# Patient Record
Sex: Female | Born: 1965 | Race: White | Hispanic: No | Marital: Single | Smoking: Current every day smoker
Health system: Southern US, Community
[De-identification: ages and names within clinical notes are randomized; demographics above are authoritative.]

## PROBLEM LIST (undated history)

## (undated) DIAGNOSIS — E785 Hyperlipidemia, unspecified: Secondary | ICD-10-CM

## (undated) DIAGNOSIS — K602 Anal fissure, unspecified: Secondary | ICD-10-CM

## (undated) DIAGNOSIS — G4730 Sleep apnea, unspecified: Secondary | ICD-10-CM

## (undated) DIAGNOSIS — D126 Benign neoplasm of colon, unspecified: Secondary | ICD-10-CM

## (undated) DIAGNOSIS — C439 Malignant melanoma of skin, unspecified: Secondary | ICD-10-CM

## (undated) DIAGNOSIS — I10 Essential (primary) hypertension: Secondary | ICD-10-CM

## (undated) DIAGNOSIS — F329 Major depressive disorder, single episode, unspecified: Secondary | ICD-10-CM

## (undated) DIAGNOSIS — Z8543 Personal history of malignant neoplasm of ovary: Secondary | ICD-10-CM

## (undated) DIAGNOSIS — G5600 Carpal tunnel syndrome, unspecified upper limb: Secondary | ICD-10-CM

## (undated) DIAGNOSIS — C569 Malignant neoplasm of unspecified ovary: Secondary | ICD-10-CM

## (undated) DIAGNOSIS — J449 Chronic obstructive pulmonary disease, unspecified: Secondary | ICD-10-CM

## (undated) DIAGNOSIS — K219 Gastro-esophageal reflux disease without esophagitis: Secondary | ICD-10-CM

## (undated) DIAGNOSIS — J45909 Unspecified asthma, uncomplicated: Secondary | ICD-10-CM

## (undated) DIAGNOSIS — F32A Depression, unspecified: Secondary | ICD-10-CM

## (undated) DIAGNOSIS — M1990 Unspecified osteoarthritis, unspecified site: Secondary | ICD-10-CM

## (undated) DIAGNOSIS — F419 Anxiety disorder, unspecified: Secondary | ICD-10-CM

## (undated) HISTORY — PX: OTHER SURGICAL HISTORY: SHX169

## (undated) HISTORY — DX: Personal history of malignant neoplasm of ovary: Z85.43

## (undated) HISTORY — DX: Unspecified osteoarthritis, unspecified site: M19.90

## (undated) HISTORY — DX: Depression, unspecified: F32.A

## (undated) HISTORY — DX: Essential (primary) hypertension: I10

## (undated) HISTORY — DX: Anxiety disorder, unspecified: F41.9

## (undated) HISTORY — DX: Malignant neoplasm of unspecified ovary: C56.9

## (undated) HISTORY — DX: Anal fissure, unspecified: K60.2

## (undated) HISTORY — PX: HIP FRACTURE SURGERY: SHX118

## (undated) HISTORY — DX: Gastro-esophageal reflux disease without esophagitis: K21.9

## (undated) HISTORY — DX: Unspecified asthma, uncomplicated: J45.909

## (undated) HISTORY — DX: Malignant melanoma of skin, unspecified: C43.9

## (undated) HISTORY — DX: Sleep apnea, unspecified: G47.30

## (undated) HISTORY — DX: Carpal tunnel syndrome, unspecified upper limb: G56.00

## (undated) HISTORY — DX: Hyperlipidemia, unspecified: E78.5

## (undated) HISTORY — DX: Benign neoplasm of colon, unspecified: D12.6

## (undated) HISTORY — PX: HEMORRHOID SURGERY: SHX153

---
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
## 1984-06-30 DIAGNOSIS — C569 Malignant neoplasm of unspecified ovary: Secondary | ICD-10-CM

## 1984-06-30 HISTORY — PX: ABDOMINAL HYSTERECTOMY: SHX81

## 1984-06-30 HISTORY — DX: Malignant neoplasm of unspecified ovary: C56.9

## 1995-07-01 DIAGNOSIS — C439 Malignant melanoma of skin, unspecified: Secondary | ICD-10-CM

## 1995-07-01 HISTORY — DX: Malignant melanoma of skin, unspecified: C43.9

## 1995-07-01 HISTORY — PX: MELANOMA EXCISION: SHX5266

## 2003-10-30 ENCOUNTER — Emergency Department (HOSPITAL_COMMUNITY): Admission: EM | Admit: 2003-10-30 | Discharge: 2003-10-31 | Payer: Self-pay | Admitting: Emergency Medicine

## 2004-02-06 ENCOUNTER — Ambulatory Visit (HOSPITAL_COMMUNITY): Admission: RE | Admit: 2004-02-06 | Discharge: 2004-02-06 | Payer: Self-pay | Admitting: Oncology

## 2004-04-05 ENCOUNTER — Ambulatory Visit (HOSPITAL_BASED_OUTPATIENT_CLINIC_OR_DEPARTMENT_OTHER): Admission: RE | Admit: 2004-04-05 | Discharge: 2004-04-05 | Payer: Self-pay | Admitting: Internal Medicine

## 2004-05-27 ENCOUNTER — Ambulatory Visit: Payer: Self-pay | Admitting: Pulmonary Disease

## 2004-06-07 ENCOUNTER — Ambulatory Visit: Payer: Self-pay | Admitting: Internal Medicine

## 2004-07-02 ENCOUNTER — Ambulatory Visit: Payer: Self-pay | Admitting: Pulmonary Disease

## 2004-07-20 ENCOUNTER — Emergency Department (HOSPITAL_COMMUNITY): Admission: EM | Admit: 2004-07-20 | Discharge: 2004-07-20 | Payer: Self-pay | Admitting: Emergency Medicine

## 2004-07-23 ENCOUNTER — Ambulatory Visit: Payer: Self-pay | Admitting: Internal Medicine

## 2004-07-26 ENCOUNTER — Emergency Department (HOSPITAL_COMMUNITY): Admission: EM | Admit: 2004-07-26 | Discharge: 2004-07-26 | Payer: Self-pay | Admitting: Emergency Medicine

## 2004-08-03 ENCOUNTER — Ambulatory Visit (HOSPITAL_COMMUNITY): Admission: RE | Admit: 2004-08-03 | Discharge: 2004-08-03 | Payer: Self-pay | Admitting: Internal Medicine

## 2004-12-19 ENCOUNTER — Ambulatory Visit: Payer: Self-pay | Admitting: Endocrinology

## 2005-01-03 ENCOUNTER — Ambulatory Visit: Payer: Self-pay | Admitting: Endocrinology

## 2005-01-10 ENCOUNTER — Ambulatory Visit: Payer: Self-pay | Admitting: Endocrinology

## 2005-01-10 ENCOUNTER — Other Ambulatory Visit: Admission: RE | Admit: 2005-01-10 | Discharge: 2005-01-10 | Payer: Self-pay | Admitting: Endocrinology

## 2005-05-16 ENCOUNTER — Ambulatory Visit: Payer: Self-pay | Admitting: Endocrinology

## 2005-05-28 ENCOUNTER — Emergency Department (HOSPITAL_COMMUNITY): Admission: EM | Admit: 2005-05-28 | Discharge: 2005-05-28 | Payer: Self-pay | Admitting: Emergency Medicine

## 2005-06-30 HISTORY — PX: HIP FRACTURE SURGERY: SHX118

## 2005-07-06 ENCOUNTER — Emergency Department (HOSPITAL_COMMUNITY): Admission: EM | Admit: 2005-07-06 | Discharge: 2005-07-06 | Payer: Self-pay | Admitting: Emergency Medicine

## 2005-09-29 ENCOUNTER — Ambulatory Visit: Payer: Self-pay | Admitting: Endocrinology

## 2005-10-22 ENCOUNTER — Emergency Department (HOSPITAL_COMMUNITY): Admission: EM | Admit: 2005-10-22 | Discharge: 2005-10-22 | Payer: Self-pay | Admitting: Emergency Medicine

## 2005-11-03 ENCOUNTER — Ambulatory Visit: Payer: Self-pay | Admitting: Endocrinology

## 2005-11-10 ENCOUNTER — Encounter: Admission: RE | Admit: 2005-11-10 | Discharge: 2005-12-09 | Payer: Self-pay | Admitting: Endocrinology

## 2006-02-06 ENCOUNTER — Ambulatory Visit: Payer: Self-pay | Admitting: Family Medicine

## 2006-02-13 ENCOUNTER — Ambulatory Visit: Payer: Self-pay | Admitting: Family Medicine

## 2006-02-18 ENCOUNTER — Encounter: Admission: RE | Admit: 2006-02-18 | Discharge: 2006-02-18 | Payer: Self-pay | Admitting: Endocrinology

## 2006-04-24 ENCOUNTER — Ambulatory Visit: Payer: Self-pay | Admitting: Family Medicine

## 2006-08-07 ENCOUNTER — Emergency Department (HOSPITAL_COMMUNITY): Admission: EM | Admit: 2006-08-07 | Discharge: 2006-08-07 | Payer: Self-pay | Admitting: Emergency Medicine

## 2006-08-10 ENCOUNTER — Ambulatory Visit: Payer: Self-pay | Admitting: Family Medicine

## 2006-09-11 ENCOUNTER — Ambulatory Visit: Payer: Self-pay | Admitting: Family Medicine

## 2006-11-16 ENCOUNTER — Encounter: Payer: Self-pay | Admitting: Family Medicine

## 2006-11-16 ENCOUNTER — Encounter: Admission: RE | Admit: 2006-11-16 | Discharge: 2006-11-16 | Payer: Self-pay | Admitting: Family Medicine

## 2007-01-06 ENCOUNTER — Ambulatory Visit: Payer: Self-pay | Admitting: Family Medicine

## 2007-01-06 LAB — CONVERTED CEMR LAB
ALT: 26 units/L (ref 0–35)
AST: 22 units/L (ref 0–37)
Albumin: 3.7 g/dL (ref 3.5–5.2)
Alkaline Phosphatase: 65 units/L (ref 39–117)
BUN: 20 mg/dL (ref 6–23)
Basophils Absolute: 0 10*3/uL (ref 0.0–0.1)
Basophils Relative: 0.3 % (ref 0.0–1.0)
Bilirubin, Direct: 0.1 mg/dL (ref 0.0–0.3)
CO2: 23 meq/L (ref 19–32)
Calcium: 9.4 mg/dL (ref 8.4–10.5)
Chloride: 112 meq/L (ref 96–112)
Cholesterol: 175 mg/dL (ref 0–200)
Creatinine, Ser: 0.6 mg/dL (ref 0.4–1.2)
Direct LDL: 105.7 mg/dL
Eosinophils Absolute: 0.1 10*3/uL (ref 0.0–0.6)
Eosinophils Relative: 0.9 % (ref 0.0–5.0)
GFR calc Af Amer: 140 mL/min
GFR calc non Af Amer: 116 mL/min
Glucose, Bld: 107 mg/dL — ABNORMAL HIGH (ref 70–99)
HCT: 38.5 % (ref 36.0–46.0)
HDL: 46.6 mg/dL (ref >39.0)
Hemoglobin: 13.4 g/dL (ref 12.0–15.0)
Hgb A1c MFr Bld: 6.1 % — ABNORMAL HIGH (ref 4.6–6.0)
Lymphocytes Relative: 27.2 % (ref 12.0–46.0)
MCHC: 34.9 g/dL (ref 30.0–36.0)
MCV: 85.8 fL (ref 78.0–100.0)
Monocytes Absolute: 0.2 10*3/uL (ref 0.2–0.7)
Monocytes Relative: 3 % (ref 3.0–11.0)
Neutro Abs: 5.7 10*3/uL (ref 1.4–7.7)
Neutrophils Relative %: 68.6 % (ref 43.0–77.0)
Platelets: 381 10*3/uL (ref 150–400)
Potassium: 4 meq/L (ref 3.5–5.1)
RBC: 4.48 M/uL (ref 3.87–5.11)
RDW: 13.4 % (ref 11.5–14.6)
Sodium: 140 meq/L (ref 135–145)
TSH: 0.93 microintl units/mL (ref 0.35–5.50)
Total Bilirubin: 0.6 mg/dL (ref 0.3–1.2)
Total CHOL/HDL Ratio: 3.8
Total Protein: 7.5 g/dL (ref 6.0–8.3)
Triglycerides: 215 mg/dL (ref 0–149)
VLDL: 43 mg/dL — ABNORMAL HIGH (ref 0–40)
WBC: 8.3 10*3/uL (ref 4.5–10.5)

## 2007-01-22 DIAGNOSIS — I10 Essential (primary) hypertension: Secondary | ICD-10-CM | POA: Insufficient documentation

## 2007-01-22 DIAGNOSIS — E119 Type 2 diabetes mellitus without complications: Secondary | ICD-10-CM | POA: Insufficient documentation

## 2007-01-22 DIAGNOSIS — E785 Hyperlipidemia, unspecified: Secondary | ICD-10-CM | POA: Insufficient documentation

## 2007-03-22 ENCOUNTER — Encounter: Payer: Self-pay | Admitting: *Deleted

## 2007-05-05 ENCOUNTER — Ambulatory Visit: Payer: Self-pay | Admitting: Family Medicine

## 2007-05-05 DIAGNOSIS — M76899 Other specified enthesopathies of unspecified lower limb, excluding foot: Secondary | ICD-10-CM | POA: Insufficient documentation

## 2007-07-19 ENCOUNTER — Telehealth: Payer: Self-pay | Admitting: Family Medicine

## 2007-07-22 ENCOUNTER — Encounter: Payer: Self-pay | Admitting: Family Medicine

## 2007-07-23 ENCOUNTER — Encounter: Admission: RE | Admit: 2007-07-23 | Discharge: 2007-07-23 | Payer: Self-pay | Admitting: Family Medicine

## 2007-08-31 ENCOUNTER — Encounter: Admission: RE | Admit: 2007-08-31 | Discharge: 2007-10-04 | Payer: Self-pay | Admitting: Family Medicine

## 2007-12-29 ENCOUNTER — Ambulatory Visit: Payer: Self-pay | Admitting: Family Medicine

## 2008-01-05 ENCOUNTER — Ambulatory Visit: Payer: Self-pay | Admitting: Family Medicine

## 2008-01-05 LAB — CONVERTED CEMR LAB
Glucose, Urine, Semiquant: NEGATIVE
Nitrite: NEGATIVE
Specific Gravity, Urine: >=1.03
Urobilinogen, UA: 0.2
WBC Urine, dipstick: NEGATIVE
pH: 5

## 2008-01-06 LAB — CONVERTED CEMR LAB
ALT: 28 units/L (ref 0–35)
AST: 23 units/L (ref 0–37)
Albumin: 3.6 g/dL (ref 3.5–5.2)
Alkaline Phosphatase: 63 units/L (ref 39–117)
BUN: 15 mg/dL (ref 6–23)
Basophils Absolute: 0.1 10*3/uL (ref 0.0–0.1)
Basophils Relative: 0.9 % (ref 0.0–1.0)
Bilirubin, Direct: 0.1 mg/dL (ref 0.0–0.3)
CO2: 27 meq/L (ref 19–32)
Calcium: 9.3 mg/dL (ref 8.4–10.5)
Chloride: 107 meq/L (ref 96–112)
Cholesterol: 147 mg/dL (ref 0–200)
Creatinine, Ser: 0.7 mg/dL (ref 0.4–1.2)
Creatinine,U: 314.5 mg/dL
Direct LDL: 85 mg/dL
Eosinophils Absolute: 0.1 10*3/uL (ref 0.0–0.7)
Eosinophils Relative: 0.9 % (ref 0.0–5.0)
GFR calc Af Amer: 117 mL/min
GFR calc non Af Amer: 97 mL/min
Glucose, Bld: 118 mg/dL — ABNORMAL HIGH (ref 70–99)
HCT: 40.2 % (ref 36.0–46.0)
HDL: 39.7 mg/dL (ref >39.0)
Hemoglobin: 13.7 g/dL (ref 12.0–15.0)
Hgb A1c MFr Bld: 6.2 % — ABNORMAL HIGH (ref 4.6–6.0)
Lymphocytes Relative: 30.1 % (ref 12.0–46.0)
MCHC: 34 g/dL (ref 30.0–36.0)
MCV: 87.6 fL (ref 78.0–100.0)
Microalb Creat Ratio: 3.2 mg/g (ref 0.0–30.0)
Microalb, Ur: 1 mg/dL (ref 0.0–1.9)
Monocytes Absolute: 0.3 10*3/uL (ref 0.1–1.0)
Monocytes Relative: 4 % (ref 3.0–12.0)
Neutro Abs: 4.4 10*3/uL (ref 1.4–7.7)
Neutrophils Relative %: 64.1 % (ref 43.0–77.0)
Platelets: 357 10*3/uL (ref 150–400)
Potassium: 4 meq/L (ref 3.5–5.1)
RBC: 4.58 M/uL (ref 3.87–5.11)
RDW: 13.1 % (ref 11.5–14.6)
Sodium: 142 meq/L (ref 135–145)
TSH: 1.09 microintl units/mL (ref 0.35–5.50)
Total Bilirubin: 0.8 mg/dL (ref 0.3–1.2)
Total CHOL/HDL Ratio: 3.7
Total Protein: 7 g/dL (ref 6.0–8.3)
Triglycerides: 206 mg/dL (ref 0–149)
VLDL: 41 mg/dL — ABNORMAL HIGH (ref 0–40)
WBC: 7 10*3/uL (ref 4.5–10.5)

## 2008-08-16 ENCOUNTER — Ambulatory Visit: Payer: Self-pay | Admitting: Vascular Surgery

## 2008-08-16 ENCOUNTER — Ambulatory Visit: Payer: Self-pay | Admitting: Internal Medicine

## 2008-08-16 ENCOUNTER — Encounter: Payer: Self-pay | Admitting: Internal Medicine

## 2008-08-16 ENCOUNTER — Ambulatory Visit: Payer: Self-pay | Admitting: Cardiovascular Disease

## 2008-08-16 ENCOUNTER — Observation Stay (HOSPITAL_COMMUNITY): Admission: EM | Admit: 2008-08-16 | Discharge: 2008-08-18 | Payer: Self-pay | Admitting: Emergency Medicine

## 2008-08-16 ENCOUNTER — Encounter: Payer: Self-pay | Admitting: Family Medicine

## 2008-08-17 ENCOUNTER — Encounter: Payer: Self-pay | Admitting: Internal Medicine

## 2008-08-23 ENCOUNTER — Ambulatory Visit: Payer: Self-pay | Admitting: Family Medicine

## 2008-08-23 DIAGNOSIS — K219 Gastro-esophageal reflux disease without esophagitis: Secondary | ICD-10-CM | POA: Insufficient documentation

## 2008-08-23 DIAGNOSIS — R079 Chest pain, unspecified: Secondary | ICD-10-CM | POA: Insufficient documentation

## 2008-08-30 ENCOUNTER — Telehealth: Payer: Self-pay | Admitting: Family Medicine

## 2008-10-04 ENCOUNTER — Encounter: Payer: Self-pay | Admitting: Family Medicine

## 2008-10-04 ENCOUNTER — Other Ambulatory Visit: Admission: RE | Admit: 2008-10-04 | Discharge: 2008-10-04 | Payer: Self-pay | Admitting: Family Medicine

## 2008-10-04 ENCOUNTER — Ambulatory Visit: Payer: Self-pay | Admitting: Family Medicine

## 2008-10-04 DIAGNOSIS — M1990 Unspecified osteoarthritis, unspecified site: Secondary | ICD-10-CM | POA: Insufficient documentation

## 2008-10-06 ENCOUNTER — Ambulatory Visit: Payer: Self-pay | Admitting: Family Medicine

## 2008-10-06 LAB — CONVERTED CEMR LAB
Bilirubin Urine: NEGATIVE
Blood in Urine, dipstick: NEGATIVE
Glucose, Urine, Semiquant: NEGATIVE
Ketones, urine, test strip: NEGATIVE
Nitrite: NEGATIVE
Specific Gravity, Urine: 1.02
Urobilinogen, UA: 0.2
WBC Urine, dipstick: NEGATIVE
pH: 7

## 2008-10-09 ENCOUNTER — Encounter: Payer: Self-pay | Admitting: Family Medicine

## 2008-10-11 LAB — CONVERTED CEMR LAB
ALT: 25 units/L (ref 0–35)
AST: 24 units/L (ref 0–37)
Albumin: 3.6 g/dL (ref 3.5–5.2)
Alkaline Phosphatase: 67 units/L (ref 39–117)
BUN: 13 mg/dL (ref 6–23)
Basophils Absolute: 0.1 10*3/uL (ref 0.0–0.1)
Basophils Relative: 1 % (ref 0.0–3.0)
Bilirubin, Direct: 0 mg/dL (ref 0.0–0.3)
CO2: 27 meq/L (ref 19–32)
Calcium: 9.2 mg/dL (ref 8.4–10.5)
Chloride: 108 meq/L (ref 96–112)
Cholesterol: 158 mg/dL (ref 0–200)
Creatinine, Ser: 0.7 mg/dL (ref 0.4–1.2)
Creatinine,U: 135.6 mg/dL
Eosinophils Absolute: 0.1 10*3/uL (ref 0.0–0.7)
Eosinophils Relative: 0.9 % (ref 0.0–5.0)
GFR calc non Af Amer: 96.18 mL/min (ref >60)
Glucose, Bld: 103 mg/dL — ABNORMAL HIGH (ref 70–99)
HCT: 37.8 % (ref 36.0–46.0)
HDL: 42.6 mg/dL (ref >39.00)
Hemoglobin: 13 g/dL (ref 12.0–15.0)
Hgb A1c MFr Bld: 6.3 % (ref 4.6–6.5)
LDL Cholesterol: 80 mg/dL (ref 0–99)
Lymphocytes Relative: 29.9 % (ref 12.0–46.0)
Lymphs Abs: 2.1 10*3/uL (ref 0.7–4.0)
MCHC: 34.5 g/dL (ref 30.0–36.0)
MCV: 86.4 fL (ref 78.0–100.0)
Microalb Creat Ratio: 3.7 mg/g (ref 0.0–30.0)
Microalb, Ur: 0.5 mg/dL (ref 0.0–1.9)
Monocytes Absolute: 0.2 10*3/uL (ref 0.1–1.0)
Monocytes Relative: 3.5 % (ref 3.0–12.0)
Neutro Abs: 4.6 10*3/uL (ref 1.4–7.7)
Neutrophils Relative %: 64.7 % (ref 43.0–77.0)
Platelets: 284 10*3/uL (ref 150.0–400.0)
Potassium: 3.8 meq/L (ref 3.5–5.1)
RBC: 4.37 M/uL (ref 3.87–5.11)
RDW: 13.6 % (ref 11.5–14.6)
Sodium: 143 meq/L (ref 135–145)
TSH: 1.18 microintl units/mL (ref 0.35–5.50)
Total Bilirubin: 0.6 mg/dL (ref 0.3–1.2)
Total CHOL/HDL Ratio: 4
Total Protein: 7.3 g/dL (ref 6.0–8.3)
Triglycerides: 179 mg/dL — ABNORMAL HIGH (ref 0.0–149.0)
VLDL: 35.8 mg/dL (ref 0.0–40.0)
WBC: 7.1 10*3/uL (ref 4.5–10.5)

## 2008-10-19 ENCOUNTER — Encounter: Payer: Self-pay | Admitting: *Deleted

## 2008-12-22 ENCOUNTER — Telehealth: Payer: Self-pay | Admitting: Family Medicine

## 2008-12-27 ENCOUNTER — Ambulatory Visit: Payer: Self-pay | Admitting: Family Medicine

## 2008-12-27 DIAGNOSIS — J209 Acute bronchitis, unspecified: Secondary | ICD-10-CM | POA: Insufficient documentation

## 2008-12-29 ENCOUNTER — Ambulatory Visit: Payer: Self-pay | Admitting: Family Medicine

## 2009-01-02 ENCOUNTER — Telehealth (INDEPENDENT_AMBULATORY_CARE_PROVIDER_SITE_OTHER): Payer: Self-pay | Admitting: *Deleted

## 2009-01-05 ENCOUNTER — Telehealth: Payer: Self-pay | Admitting: Family Medicine

## 2009-01-08 ENCOUNTER — Ambulatory Visit: Payer: Self-pay | Admitting: Family Medicine

## 2009-01-08 DIAGNOSIS — K645 Perianal venous thrombosis: Secondary | ICD-10-CM | POA: Insufficient documentation

## 2009-01-12 ENCOUNTER — Telehealth: Payer: Self-pay | Admitting: Family Medicine

## 2009-01-12 ENCOUNTER — Emergency Department (HOSPITAL_BASED_OUTPATIENT_CLINIC_OR_DEPARTMENT_OTHER): Admission: EM | Admit: 2009-01-12 | Discharge: 2009-01-12 | Payer: Self-pay | Admitting: Emergency Medicine

## 2009-08-21 ENCOUNTER — Encounter: Payer: Self-pay | Admitting: Family Medicine

## 2009-10-18 ENCOUNTER — Ambulatory Visit: Payer: Self-pay | Admitting: Internal Medicine

## 2009-10-18 LAB — CONVERTED CEMR LAB: Blood Glucose, Fingerstick: 101

## 2009-11-15 ENCOUNTER — Ambulatory Visit: Payer: Self-pay | Admitting: Family Medicine

## 2009-11-16 LAB — CONVERTED CEMR LAB
ALT: 36 units/L — ABNORMAL HIGH (ref 0–35)
AST: 26 units/L (ref 0–37)
Albumin: 3.9 g/dL (ref 3.5–5.2)
Alkaline Phosphatase: 79 units/L (ref 39–117)
BUN: 19 mg/dL (ref 6–23)
Basophils Absolute: 0 10*3/uL (ref 0.0–0.1)
Basophils Relative: 0.2 % (ref 0.0–3.0)
Bilirubin, Direct: 0.1 mg/dL (ref 0.0–0.3)
CO2: 24 meq/L (ref 19–32)
Calcium: 9 mg/dL (ref 8.4–10.5)
Chloride: 107 meq/L (ref 96–112)
Cholesterol: 171 mg/dL (ref 0–200)
Creatinine, Ser: 0.6 mg/dL (ref 0.4–1.2)
Creatinine,U: 189.3 mg/dL
Direct LDL: 106.3 mg/dL
Eosinophils Absolute: 0 10*3/uL (ref 0.0–0.7)
Eosinophils Relative: 0.6 % (ref 0.0–5.0)
GFR calc non Af Amer: 108.08 mL/min (ref >60)
Glucose, Bld: 103 mg/dL — ABNORMAL HIGH (ref 70–99)
HCT: 39.8 % (ref 36.0–46.0)
HDL: 49 mg/dL (ref >39.00)
Hemoglobin: 13.6 g/dL (ref 12.0–15.0)
Hgb A1c MFr Bld: 6.1 % (ref 4.6–6.5)
Lymphocytes Relative: 27.2 % (ref 12.0–46.0)
Lymphs Abs: 2.2 10*3/uL (ref 0.7–4.0)
MCHC: 34.1 g/dL (ref 30.0–36.0)
MCV: 87.2 fL (ref 78.0–100.0)
Microalb Creat Ratio: 1.5 mg/g (ref 0.0–30.0)
Microalb, Ur: 2.9 mg/dL — ABNORMAL HIGH (ref 0.0–1.9)
Monocytes Absolute: 0.3 10*3/uL (ref 0.1–1.0)
Monocytes Relative: 4.1 % (ref 3.0–12.0)
Neutro Abs: 5.4 10*3/uL (ref 1.4–7.7)
Neutrophils Relative %: 67.9 % (ref 43.0–77.0)
Platelets: 332 10*3/uL (ref 150.0–400.0)
Potassium: 4.2 meq/L (ref 3.5–5.1)
RBC: 4.56 M/uL (ref 3.87–5.11)
RDW: 14 % (ref 11.5–14.6)
Sodium: 140 meq/L (ref 135–145)
TSH: 1 microintl units/mL (ref 0.35–5.50)
Total Bilirubin: 0.6 mg/dL (ref 0.3–1.2)
Total CHOL/HDL Ratio: 3
Total Protein: 7.2 g/dL (ref 6.0–8.3)
Triglycerides: 223 mg/dL — ABNORMAL HIGH (ref 0.0–149.0)
VLDL: 44.6 mg/dL — ABNORMAL HIGH (ref 0.0–40.0)
WBC: 8 10*3/uL (ref 4.5–10.5)

## 2009-12-10 ENCOUNTER — Telehealth: Payer: Self-pay | Admitting: Family Medicine

## 2009-12-21 ENCOUNTER — Other Ambulatory Visit: Admission: RE | Admit: 2009-12-21 | Discharge: 2009-12-21 | Payer: Self-pay | Admitting: Family Medicine

## 2009-12-21 ENCOUNTER — Ambulatory Visit: Payer: Self-pay | Admitting: Family Medicine

## 2009-12-21 LAB — HM PAP SMEAR

## 2009-12-25 LAB — CONVERTED CEMR LAB: Pap Smear: NEGATIVE

## 2009-12-27 ENCOUNTER — Encounter: Admission: RE | Admit: 2009-12-27 | Discharge: 2009-12-27 | Payer: Self-pay | Admitting: Family Medicine

## 2009-12-27 LAB — HM MAMMOGRAPHY: HM Mammogram: NEGATIVE

## 2010-01-08 ENCOUNTER — Ambulatory Visit: Payer: Self-pay | Admitting: Diagnostic Radiology

## 2010-01-08 ENCOUNTER — Emergency Department (HOSPITAL_BASED_OUTPATIENT_CLINIC_OR_DEPARTMENT_OTHER): Admission: EM | Admit: 2010-01-08 | Discharge: 2010-01-08 | Payer: Self-pay | Admitting: Emergency Medicine

## 2010-01-09 ENCOUNTER — Encounter: Payer: Self-pay | Admitting: Family Medicine

## 2010-01-14 ENCOUNTER — Encounter: Payer: Self-pay | Admitting: Family Medicine

## 2010-03-11 ENCOUNTER — Telehealth: Payer: Self-pay | Admitting: Family Medicine

## 2010-03-29 ENCOUNTER — Telehealth: Payer: Self-pay | Admitting: Family Medicine

## 2010-04-04 ENCOUNTER — Ambulatory Visit: Payer: Self-pay | Admitting: Family Medicine

## 2010-04-04 DIAGNOSIS — K644 Residual hemorrhoidal skin tags: Secondary | ICD-10-CM | POA: Insufficient documentation

## 2010-06-06 ENCOUNTER — Emergency Department (HOSPITAL_BASED_OUTPATIENT_CLINIC_OR_DEPARTMENT_OTHER)
Admission: EM | Admit: 2010-06-06 | Discharge: 2010-06-06 | Payer: Self-pay | Source: Home / Self Care | Admitting: Emergency Medicine

## 2010-06-11 ENCOUNTER — Ambulatory Visit: Payer: Self-pay | Admitting: Family Medicine

## 2010-06-11 DIAGNOSIS — K921 Melena: Secondary | ICD-10-CM | POA: Insufficient documentation

## 2010-06-11 DIAGNOSIS — R1031 Right lower quadrant pain: Secondary | ICD-10-CM | POA: Insufficient documentation

## 2010-06-12 ENCOUNTER — Encounter: Payer: Self-pay | Admitting: Gastroenterology

## 2010-06-12 LAB — CONVERTED CEMR LAB
ALT: 31 units/L (ref 0–35)
AST: 30 units/L (ref 0–37)
Albumin: 3.7 g/dL (ref 3.5–5.2)
Alkaline Phosphatase: 70 units/L (ref 39–117)
Amylase: 49 units/L (ref 27–131)
BUN: 20 mg/dL (ref 6–23)
Basophils Absolute: 0 10*3/uL (ref 0.0–0.1)
Basophils Relative: 0.2 % (ref 0.0–3.0)
Bilirubin, Direct: 0.1 mg/dL (ref 0.0–0.3)
CO2: 26 meq/L (ref 19–32)
Calcium: 9.2 mg/dL (ref 8.4–10.5)
Chloride: 103 meq/L (ref 96–112)
Creatinine, Ser: 0.9 mg/dL (ref 0.4–1.2)
Eosinophils Absolute: 0 10*3/uL (ref 0.0–0.7)
Eosinophils Relative: 0.5 % (ref 0.0–5.0)
GFR calc non Af Amer: 75.28 mL/min (ref >60.00)
Glucose, Bld: 141 mg/dL — ABNORMAL HIGH (ref 70–99)
HCT: 37.3 % (ref 36.0–46.0)
Hemoglobin: 12.8 g/dL (ref 12.0–15.0)
Lymphocytes Relative: 29.3 % (ref 12.0–46.0)
Lymphs Abs: 2.6 10*3/uL (ref 0.7–4.0)
MCHC: 34.2 g/dL (ref 30.0–36.0)
MCV: 88.5 fL (ref 78.0–100.0)
Monocytes Absolute: 0.3 10*3/uL (ref 0.1–1.0)
Monocytes Relative: 3.7 % (ref 3.0–12.0)
Neutro Abs: 6 10*3/uL (ref 1.4–7.7)
Neutrophils Relative %: 66.3 % (ref 43.0–77.0)
Platelets: 371 10*3/uL (ref 150.0–400.0)
Potassium: 3.7 meq/L (ref 3.5–5.1)
RBC: 4.22 M/uL (ref 3.87–5.11)
RDW: 14.2 % (ref 11.5–14.6)
Sodium: 140 meq/L (ref 135–145)
Total Bilirubin: 0.8 mg/dL (ref 0.3–1.2)
Total Protein: 7.2 g/dL (ref 6.0–8.3)
WBC: 9 10*3/uL (ref 4.5–10.5)

## 2010-07-19 ENCOUNTER — Ambulatory Visit: Admit: 2010-07-19 | Payer: Self-pay | Admitting: Gastroenterology

## 2010-07-21 ENCOUNTER — Encounter: Payer: Self-pay | Admitting: Orthopaedic Surgery

## 2010-07-23 ENCOUNTER — Ambulatory Visit
Admission: RE | Admit: 2010-07-23 | Discharge: 2010-07-23 | Payer: Self-pay | Source: Home / Self Care | Attending: Family Medicine | Admitting: Family Medicine

## 2010-07-26 ENCOUNTER — Telehealth: Payer: Self-pay | Admitting: Family Medicine

## 2010-07-30 NOTE — Assessment & Plan Note (Signed)
Summary: BURNING AROUND RECTUM? // RS/PT [facility] FROM BMP/CJR   Vital Signs:  Patient profile:   45 year old female O2 Sat:      98 % Temp:     98.1 degrees F Pulse rate:   84 / minute BP sitting:   90 / 60  (left arm) Cuff size:   large  Vitals Entered By: Pura Spice, RN (April 04, 2010 9:55 AM) CC: rectal burning  constipation not using anusol supp    History of Present Illness: Here for 2 weeks of rectal pain. She has seen no bleeding. She has tried Anusol HC suppositories, Aloe cream, and Diltiazem cream but nothing helps. BMs are regular but painful to pass.   Allergies (verified): No Known Drug Allergies  Past History:  Past Medical History: Reviewed history from 12/21/2009 and no changes required. Diabetes mellitus, type II Hyperlipidemia Hypertension Ovarian CA, hx of-1986 Osteoarthritis, has chronic right hip pain, sees Dr. August Saucer GERD normal stress Myoview and normal ECHO 08-18-08  Past Surgical History: Reviewed history from 12/21/2009 and no changes required. TAH/ BSO with cervix still intact 1986 Melanoma removed- upper chest-1997 per Dr. Elmon Else Hemorrhoidectomy per Dr. Jimmye Norman  Review of Systems  The patient denies anorexia, fever, weight loss, weight gain, vision loss, decreased hearing, hoarseness, chest pain, syncope, dyspnea on exertion, peripheral edema, prolonged cough, headaches, hemoptysis, abdominal pain, melena, hematochezia, severe indigestion/heartburn, hematuria, incontinence, genital sores, muscle weakness, suspicious skin lesions, transient blindness, difficulty walking, depression, unusual weight change, abnormal bleeding, enlarged lymph nodes, angioedema, breast masses, and testicular masses.    Physical Exam  General:  Well-developed,well-nourished,in no acute distress; alert,appropriate and cooperative throughout examination Abdomen:  Bowel sounds positive,abdomen soft and non-tender without masses, organomegaly or hernias  noted. Rectal:  numerous external hemorrhoids which are quite tender   Impression & Recommendations:  Problem # 1:  HEMORRHOIDS, EXTERNAL (ICD-455.3)  Complete Medication List: 1)  Metformin Hcl 500 Mg Tb24 (Metformin hcl) .Marland Kitchen.. 1 by mouth two times a day 2)  Metoprolol Tartrate 25 Mg Tabs (Metoprolol tartrate) .Marland Kitchen.. 1 by mouth two times a day 3)  Lisinopril-hydrochlorothiazide 20-25 Mg Tabs (Lisinopril-hydrochlorothiazide) .... Once daily 4)  Omeprazole 40 Mg Cpdr (Omeprazole) .Marland Kitchen.. 1 once daily 5)  Anusol-hc 25 Mg Supp (Hydrocortisone acetate) .... Insert 1 per rectum three times a day 6)  Lipitor 40 Mg Tabs (Atorvastatin calcium) .... Once daily  Patient Instructions: 1)  She needs to see Surgery, and she will call them for an appt.  Prescriptions: LIPITOR 40 MG TABS (ATORVASTATIN CALCIUM) once daily  #30 x 11   Entered and Authorized by:   Nelwyn Salisbury MD   Signed by:   Nelwyn Salisbury MD on 04/04/2010   Method used:   Electronically to        [facility]  [phone]* (retail)       [address] [place], [place]  [address]       Ph: [phone] or [phone]       Fax: [phone]   RxID:   [id number]

## 2010-07-30 NOTE — Letter (Signed)
Summary: [facility]   Imported By: Maryln Gottron 08/27/2009 12:27:55  _____________________________________________________________________  External Attachment:    Type:   Image     Comment:   External Document

## 2010-07-30 NOTE — Assessment & Plan Note (Signed)
Summary: elevated BP/dm   Vital Signs:  Patient profile:   45 year old female Weight:      242 pounds Temp:     98.5 degrees F oral BP sitting:   140 / 90  (left arm) Cuff size:   large  Vitals Entered By: Duard Brady LPN (October 18, 2009 4:26 PM) CC: c/o elevated BP's - bingeing??? eating chips, icecream  drinking moutntian dew espeically in the evening -  Is Patient Diabetic? Yes Did you bring your meter with you today? No CBG Result 101   CC:  c/o elevated BP's - bingeing??? eating chips and icecream  drinking moutntian dew espeically in the evening - .  History of Present Illness: 45 year old patient has a history of type 2 diabetes and hypertension; she has been controlled on beta blocker therapy.  she has recently obtained a wrist blood pressure cuff, and has had some elevated readings.  She admits to a high salt diet and dietary noncompliance.  She feels well.  Unfortunately,  she did not bring her new blood pressure monitor with her for this office visit.  Allergies (verified): No Known Drug Allergies  Past History:  Past Medical History: Reviewed history from 10/04/2008 and no changes required. Diabetes mellitus, type II Hyperlipidemia Hypertension Ovarian CA, hx of-1986 Osteoarthritis, has chronic right hip pain, sees Dr. August Saucer  Physical Exam  General:  overweight-appearing.  140/90 Head:  Normocephalic and atraumatic without obvious abnormalities. No apparent alopecia or balding. Mouth:  Oral mucosa and oropharynx without lesions or exudates.  Teeth in good repair. Neck:  No deformities, masses, or tenderness noted. Lungs:  Normal respiratory effort, chest expands symmetrically. Lungs are clear to auscultation, no crackles or wheezes. Heart:  Normal rate and regular rhythm. S1 and S2 normal without gallop, murmur, click, rub or other extra sounds.   Impression & Recommendations:  Problem # 1:  HYPERTENSION (ICD-401.9)  Her updated medication list for  this problem includes:    Metoprolol Tartrate 25 Mg Tabs (Metoprolol tartrate) .Marland Kitchen... 1 by mouth two times a day    Triamterene-hctz 37.5-25 Mg Caps (Triamterene-hctz) ..... One every morning lifestyle modification, including salt restriction, exercise, weight loss.  All encouraged.  She is scheduled for a follow-up visit in a month or so and will place on diuretic therapy.  At this time.  She has been asked to bring her home blood pressure monitor to her next routine office visit  Problem # 2:  DIABETES MELLITUS, TYPE II (ICD-250.00)  Her updated medication list for this problem includes:    Metformin Hcl 500 Mg Tb24 (Metformin hcl) .Marland Kitchen... 1 by mouth two times a day  Orders: Capillary Blood Glucose/CBG (13086)  Complete Medication List: 1)  Metformin Hcl 500 Mg Tb24 (Metformin hcl) .Marland Kitchen.. 1 by mouth two times a day 2)  Metoprolol Tartrate 25 Mg Tabs (Metoprolol tartrate) .Marland Kitchen.. 1 by mouth two times a day 3)  Simvastatin 80 Mg Tabs (Simvastatin) .... Take 1 tablet by mouth at bedtime 4)  Triamterene-hctz 37.5-25 Mg Caps (Triamterene-hctz) .... One every morning  Patient Instructions: 1)  Limit your Sodium (Salt) to less than 2 grams a day(slightly less than 1/2 a teaspoon) to prevent fluid retention, swelling, or worsening of symptoms. 2)  It is important that you exercise regularly at least 20 minutes 5 times a week. If you develop chest pain, have severe difficulty breathing, or feel very tired , stop exercising immediately and seek medical attention. 3)  You need to  lose weight. Consider a lower calorie diet and regular exercise.  4)  Check your blood sugars regularly. If your readings are usually above : or below 70 you should contact our office. 5)  It is important that your Diabetic A1c level is checked every 3 months. 6)  Check your Blood Pressure regularly. If it is above: 150/90 you should make an appointment. Prescriptions: TRIAMTERENE-HCTZ 37.5-25 MG CAPS (TRIAMTERENE-HCTZ) one every  morning  #90 x 0   Entered and Authorized by:   Gordy Savers  MD   Signed by:   Gordy Savers  MD on 10/18/2009   Method used:   Electronically to        [facility]  [phone]* (retail)       [address] [place], [place]  [address]       Ph: [phone] or [phone]       Fax: [phone]   RxID:   [id number]

## 2010-07-30 NOTE — Assessment & Plan Note (Signed)
Summary: fu on bpv/pt will come in fasting/njrn   Vital Signs:  Patient profile:   45 year old female Weight:      240 pounds BMI:     40.08 Temp:     98.2 degrees F oral BP sitting:   140 / 108  (left arm) Cuff size:   regular  Vitals Entered By: Raechel Ache, RN (Nov 15, 2009 8:48 AM) CC: Follow up BP and fasting for labs.   History of Present Illness: Here to follow up on DM and HTN. She was here a few weeks ago with high BPs, and she was given a rx for triamterene HCTZ. However she did not get this filled because she wanted to see me first. Her BP at home still ranges from 140 to 170 systolic. She feels well. She does not check her blood sugars at home at all. She is fasting this am.   Allergies: No Known Drug Allergies  Past History:  Past Medical History: Reviewed history from 10/04/2008 and no changes required. Diabetes mellitus, type II Hyperlipidemia Hypertension Ovarian CA, hx of-1986 Osteoarthritis, has chronic right hip pain, sees Dr. August Saucer  Review of Systems  The patient denies anorexia, fever, weight loss, weight gain, vision loss, decreased hearing, hoarseness, chest pain, syncope, dyspnea on exertion, peripheral edema, prolonged cough, headaches, hemoptysis, abdominal pain, melena, hematochezia, severe indigestion/heartburn, hematuria, incontinence, genital sores, muscle weakness, suspicious skin lesions, transient blindness, difficulty walking, depression, unusual weight change, abnormal bleeding, enlarged lymph nodes, angioedema, breast masses, and testicular masses.    Physical Exam  General:  overweight-appearing.   Neck:  No deformities, masses, or tenderness noted. Lungs:  Normal respiratory effort, chest expands symmetrically. Lungs are clear to auscultation, no crackles or wheezes. Heart:  Normal rate and regular rhythm. S1 and S2 normal without gallop, murmur, click, rub or other extra sounds.   Impression & Recommendations:  Problem # 1:   HYPERTENSION (ICD-401.9)  The following medications were removed from the medication list:    Triamterene-hctz 37.5-25 Mg Caps (Triamterene-hctz) ..... One every morning Her updated medication list for this problem includes:    Metoprolol Tartrate 25 Mg Tabs (Metoprolol tartrate) .Marland Kitchen... 1 by mouth two times a day    Lisinopril-hydrochlorothiazide 20-25 Mg Tabs (Lisinopril-hydrochlorothiazide) ..... Once daily  Problem # 2:  HYPERLIPIDEMIA (ICD-272.4)  Her updated medication list for this problem includes:    Simvastatin 80 Mg Tabs (Simvastatin) .Marland Kitchen... Take 1 tablet by mouth at bedtime  Problem # 3:  DIABETES MELLITUS, TYPE II (ICD-250.00)  Her updated medication list for this problem includes:    Metformin Hcl 500 Mg Tb24 (Metformin hcl) .Marland Kitchen... 1 by mouth two times a day    Lisinopril-hydrochlorothiazide 20-25 Mg Tabs (Lisinopril-hydrochlorothiazide) ..... Once daily  Orders: UA Dipstick w/o Micro (automated)  (81003) Venipuncture (04540) TLB-Lipid Panel (80061-LIPID) TLB-BMP (Basic Metabolic Panel-BMET) (80048-METABOL) TLB-CBC Platelet - w/Differential (85025-CBCD) TLB-Hepatic/Liver Function Pnl (80076-HEPATIC) TLB-TSH (Thyroid Stimulating Hormone) (84443-TSH) TLB-Microalbumin/Creat Ratio, Urine (82043-MALB) TLB-A1C / Hgb A1C (Glycohemoglobin) (83036-A1C)  Complete Medication List: 1)  Metformin Hcl 500 Mg Tb24 (Metformin hcl) .Marland Kitchen.. 1 by mouth two times a day 2)  Metoprolol Tartrate 25 Mg Tabs (Metoprolol tartrate) .Marland Kitchen.. 1 by mouth two times a day 3)  Simvastatin 80 Mg Tabs (Simvastatin) .... Take 1 tablet by mouth at bedtime 4)  Lisinopril-hydrochlorothiazide 20-25 Mg Tabs (Lisinopril-hydrochlorothiazide) .... Once daily  Patient Instructions: 1)  Get labs today. Switch from triamterene to an ACE-diuretic med due to her diabetes.  Prescriptions: LISINOPRIL-HYDROCHLOROTHIAZIDE 20-25  MG TABS (LISINOPRIL-HYDROCHLOROTHIAZIDE) once daily  #30 x 11   Entered and Authorized by:    Nelwyn Salisbury MD   Signed by:   Nelwyn Salisbury MD on 11/15/2009   Method used:   Electronically to        [facility]  [phone]* (retail)       [address] [place], [place]  [address]       Ph: [phone] or [phone]       Fax: [phone]   RxID:   [id number] SIMVASTATIN 80 MG TABS (SIMVASTATIN) Take 1 tablet by mouth at bedtime  #30 x 11   Entered and Authorized by:   Nelwyn Salisbury MD   Signed by:   Nelwyn Salisbury MD on 11/15/2009   Method used:   Electronically to        [facility]  [phone]* (retail)       [address] [place], [place]  [address]       Ph: [phone] or [phone]       Fax: [phone]   RxID:   [id number] METFORMIN HCL 500 MG TB24 (METFORMIN HCL) 1 by mouth two times a day  #60 x 11   Entered and Authorized by:   Nelwyn Salisbury MD   Signed by:   Nelwyn Salisbury MD on 11/15/2009   Method used:   Electronically to        [facility]  [phone]* (retail)       [address] [place], [place]  [address]       Ph: [phone] or [phone]       Fax: [phone]   RxID:   [id number]   Appended Document: Orders Update     Clinical Lists Changes  Observations: Added new observation of COMMENTS: Wynona Canes, CMA  Nov 15, 2009 11:03 AM  (11/15/2009 11:02) Added new observation of PH URINE: 6.0  (11/15/2009 11:02) Added new observation of SPEC GR URIN: >=1.030  (11/15/2009 11:02) Added new observation of APPEARANCE U: Clear  (11/15/2009 11:02) Added new observation of WBC DIPSTK U: negative  (11/15/2009 11:02) Added new observation of NITRITE URN: negative  (11/15/2009 11:02) Added new observation of UROBILINOGEN: 0.2  (11/15/2009 11:02) Added new observation of PROTEIN, URN: 1+  (11/15/2009 11:02) Added new observation of BLOOD UR DIP: negative  (11/15/2009 11:02) Added new observation of KETONES URN: negative  (11/15/2009 11:02) Added new observation of BILIRUBIN UR: negative  (11/15/2009  11:02) Added new observation of GLUCOSE, URN: negative  (11/15/2009 11:02)      Laboratory Results   Urine Tests  Date/Time Recieved: Nov 15, 2009 11:03 AM  Date/Time Reported: Nov 15, 2009 11:03 AM   Routine Urinalysis   Appearance: Clear Glucose: negative   (Normal Range: Negative) Bilirubin: negative   (Normal Range: Negative) Ketone: negative   (Normal Range: Negative) Spec. Gravity: >=1.030   (Normal Range: 1.003-1.035) Blood: negative   (Normal Range: Negative) pH: 6.0   (Normal Range: 5.0-8.0) Protein: 1+   (Normal Range: Negative) Urobilinogen: 0.2   (Normal Range: 0-1) Nitrite: negative   (Normal Range: Negative) Leukocyte Esterace: negative   (Normal Range: Negative)    Comments: Wynona Canes, CMA  Nov 15, 2009 11:03 AM

## 2010-07-30 NOTE — Assessment & Plan Note (Signed)
Summary: cpx/pap/pt will come in fasting/ok per doc/njr   Vital Signs:  Patient profile:   45 year old female Weight:      240 pounds BMI:     40.08 BP sitting:   100 / 82  (left arm) Cuff size:   large  Vitals Entered By: Raechel Ache, RN (December 21, 2009 9:45 AM) CC: CPX, labs done 1 mo ago.    History of Present Illness: 45 yr old female for a cpx. She feels fine and has no concerns.   Allergies (verified): No Known Drug Allergies  Past History:  Past Medical History: Diabetes mellitus, type II Hyperlipidemia Hypertension Ovarian CA, hx of-1986 Osteoarthritis, has chronic right hip pain, sees Dr. August Saucer GERD normal stress Myoview and normal ECHO 08-18-08  Past Surgical History: TAH/ BSO with cervix still intact 1986 Melanoma removed- upper chest-1997 per Dr. Elmon Else Hemorrhoidectomy per Dr. Jimmye Norman  Family History: Reviewed history from 01/22/2007 and no changes required. Family History of Arthritis Family History Diabetes 1st degree relative Family History Other cancer-Colon, Prostate,Breast Family History of Cardiovascular disorder  Social History: Reviewed history from 01/22/2007 and no changes required. Single Former Smoker Alcohol use-no Drug use-no Regular exercise-no  Review of Systems  The patient denies anorexia, fever, weight loss, vision loss, decreased hearing, hoarseness, chest pain, syncope, dyspnea on exertion, peripheral edema, prolonged cough, headaches, hemoptysis, abdominal pain, melena, hematochezia, severe indigestion/heartburn, hematuria, incontinence, genital sores, muscle weakness, suspicious skin lesions, transient blindness, difficulty walking, depression, unusual weight change, abnormal bleeding, enlarged lymph nodes, angioedema, breast masses, and testicular masses.    Physical Exam  General:  overweight-appearing.   Head:  Normocephalic and atraumatic without obvious abnormalities. No apparent alopecia or balding. Eyes:   No corneal or conjunctival inflammation noted. EOMI. Perrla. Funduscopic exam benign, without hemorrhages, exudates or papilledema. Vision grossly normal. Ears:  External ear exam shows no significant lesions or deformities.  Otoscopic examination reveals clear canals, tympanic membranes are intact bilaterally without bulging, retraction, inflammation or discharge. Hearing is grossly normal bilaterally. Nose:  External nasal examination shows no deformity or inflammation. Nasal mucosa are pink and moist without lesions or exudates. Mouth:  Oral mucosa and oropharynx without lesions or exudates.  Teeth in good repair. Neck:  No deformities, masses, or tenderness noted. Chest Wall:  No deformities, masses, or tenderness noted. Breasts:  No mass, nodules, thickening, tenderness, bulging, retraction, inflamation, nipple discharge or skin changes noted.   Lungs:  Normal respiratory effort, chest expands symmetrically. Lungs are clear to auscultation, no crackles or wheezes. Heart:  Normal rate and regular rhythm. S1 and S2 normal without gallop, murmur, click, rub or other extra sounds. Abdomen:  Bowel sounds positive,abdomen soft and non-tender without masses, organomegaly or hernias noted. Genitalia:  normal introitus, no external lesions, no vaginal discharge, mucosa pink and moist, and no vaginal or cervical lesions.  Uterus and adnexae are absent. Pap smear was obtained.  Msk:  No deformity or scoliosis noted of thoracic or lumbar spine.   Pulses:  R and L carotid,radial,femoral,dorsalis pedis and posterior tibial pulses are full and equal bilaterally Extremities:  No clubbing, cyanosis, edema, or deformity noted with normal full range of motion of all joints.   Neurologic:  No cranial nerve deficits noted. Station and gait are normal. Plantar reflexes are down-going bilaterally. DTRs are symmetrical throughout. Sensory, motor and coordinative functions appear intact. Skin:  Intact without suspicious  lesions or rashes Cervical Nodes:  No lymphadenopathy noted Axillary Nodes:  No palpable  lymphadenopathy Inguinal Nodes:  No significant adenopathy Psych:  Cognition and judgment appear intact. Alert and cooperative with normal attention span and concentration. No apparent delusions, illusions, hallucinations   Impression & Recommendations:  Problem # 1:  WELL ADULT EXAM (ICD-V70.0)  Complete Medication List: 1)  Metformin Hcl 500 Mg Tb24 (Metformin hcl) .Marland Kitchen.. 1 by mouth two times a day 2)  Metoprolol Tartrate 25 Mg Tabs (Metoprolol tartrate) .Marland Kitchen.. 1 by mouth two times a day 3)  Simvastatin 80 Mg Tabs (Simvastatin) .... Take 1 tablet by mouth at bedtime 4)  Lisinopril-hydrochlorothiazide 20-25 Mg Tabs (Lisinopril-hydrochlorothiazide) .... Once daily  Other Orders: Ophthalmology Referral (Ophthalmology)  Patient Instructions: 1)  It is important that you exercise reguarly at least 20 minutes 5 times a week. If you develop chest pain, have severe difficulty breathing, or feel very tired, stop exercising immediately and seek medical attention.  2)  You need to lose weight. Consider a lower calorie diet and regular exercise.  3)  Schedule your mammogram.  4)  We will set her up for an eye exam. Prescriptions: SIMVASTATIN 80 MG TABS (SIMVASTATIN) Take 1 tablet by mouth at bedtime  #30 x 11   Entered and Authorized by:   Nelwyn Salisbury MD   Signed by:   Nelwyn Salisbury MD on 12/21/2009   Method used:   Electronically to        [facility]  [phone]* (retail)       [address] [place], [place]  [address]       Ph: [phone] or [phone]       Fax: [phone]   RxID:   [id number] METOPROLOL TARTRATE 25 MG TABS (METOPROLOL TARTRATE) 1 by mouth two times a day  #60 x 11   Entered and Authorized by:   Nelwyn Salisbury MD   Signed by:   Nelwyn Salisbury MD on 12/21/2009   Method used:   Electronically to        [facility]  [phone]* (retail)       [address]  [place], [place]  [address]       Ph: [phone] or [phone]       Fax: [phone]   RxID:   [id number]

## 2010-07-30 NOTE — Progress Notes (Signed)
Summary: ? change med  simvastatin  Phone Note From Pharmacy   [facility]er[facility] Summary of Call: ?change simvastin 80 mg  Initial call taken by: Pura Spice, RN,  March 11, 2010 11:52 AM  Follow-up for Phone Call        I agree. Stop the Zocor, and switch to Lipitor 40 mg once daily . Call in a one year supply Follow-up by: Nelwyn Salisbury MD,  March 11, 2010 2:58 PM  Additional Follow-up for Phone Call Additional follow up Details #1::        done pt aware Additional Follow-up by: Pura Spice, RN,  March 11, 2010 2:59 PM    New/Updated Medications: LIPITOR 40 MG TABS (ATORVASTATIN CALCIUM) 1 by mouth once daily Prescriptions: LIPITOR 40 MG TABS (ATORVASTATIN CALCIUM) 1 by mouth once daily  #30 x 11   Entered by:   Pura Spice, RN   Authorized by:   Nelwyn Salisbury MD   Signed by:   Pura Spice, RN on 03/11/2010   Method used:   Electronically to        [facility]  [phone]* (retail)       [address] [place], [place]  [address]       Ph: [phone] or [phone]       Fax: [phone]   RxID:   [id number]

## 2010-07-30 NOTE — Progress Notes (Signed)
Summary: rectal pain  Phone Note Call from Patient Call back at Home Phone [phone]   Caller: Patient Call For: Nelwyn Salisbury MD Summary of Call: Pt is complaining of rectal pain with BMs, and is asking if she should see him or [facility]??? Initial call taken by: Lynann Beaver CMA,  March 29, 2010 3:36 PM  Follow-up for Phone Call        we cannot see her today at this hour, and I doubt Surgery could either. I suggest she try some Anusol HC 25 mg suppositories to help reduce inflammation and pain. Call in #30 with 2 rf for her.  Follow-up by: Nelwyn Salisbury MD,  March 29, 2010 4:08 PM  Additional Follow-up for Phone Call Additional follow up Details #1::        pt aware. rx [facility]  Additional Follow-up by: Pura Spice, RN,  March 29, 2010 4:11 PM    New/Updated Medications: ANUSOL-HC 25 MG SUPP (HYDROCORTISONE ACETATE) insert 1 per rectum Prescriptions: ANUSOL-HC 25 MG SUPP (HYDROCORTISONE ACETATE) insert 1 per rectum  #30 x 2   Entered by:   Pura Spice, RN   Authorized by:   Nelwyn Salisbury MD   Signed by:   Pura Spice, RN on 03/29/2010   Method used:   Electronically to        [facility]  [phone]* (retail)       [address] [place], [place]  [address]       Ph: [phone] or [phone]       Fax: [phone]   RxID:   [id number]   Appended Document: [facility]     Appended Document: rectal pain per rectum three times a day  sent electronically and called to [facility].gh rn...............Marland Kitchen

## 2010-07-30 NOTE — Progress Notes (Signed)
Summary: needs cpx before 01-06-10  Phone Note Call from Patient Call back at Home Phone [phone]   Caller: Patient Call For: Nelwyn Salisbury MD Summary of Call: pt needs cpx before 01-06-10 Initial call taken by: Heron Sabins,  December 10, 2009 3:43 PM  Follow-up for Phone Call        work her in next week please Follow-up by: Nelwyn Salisbury MD,  December 12, 2009 1:07 PM  Additional Follow-up for Phone Call Additional follow up Details #1::        cpx 12-21-2009 945am fasting Additional Follow-up by: Heron Sabins,  December 12, 2009 2:23 PM

## 2010-07-30 NOTE — Letter (Signed)
Summary: [facility]   Imported By: Maryln Gottron 01/18/2010 14:11:07  _____________________________________________________________________  External Attachment:    Type:   Image     Comment:   External Document

## 2010-07-30 NOTE — Letter (Signed)
Summary: [facility]   Imported By: Maryln Gottron 02/01/2010 13:13:43  _____________________________________________________________________  External Attachment:    Type:   Image     Comment:   External Document

## 2010-08-01 NOTE — Miscellaneous (Signed)
Summary: Orders Update   Clinical Lists Changes  Orders: Added new Referral order of Gastroenterology Referral (GI) - Signed 

## 2010-08-01 NOTE — Assessment & Plan Note (Signed)
Summary: cough /bronchitis   Vital Signs:  Patient profile:   45 year old female Weight:      237 pounds O2 Sat:      98 % Temp:     98.5 degrees F Pulse rate:   113 / minute BP sitting:   112 / 80 Cuff size:   large  Vitals Entered By: Pura Spice, RN (July 23, 2010 4:19 PM) CC: [facility] was given Zpack and told had bronchitis  taking mucinex OTC    History of Present Illness: Here for a cough that she has had for about 2 weeks. Her chest is congested and she is coughing up green sputum. No fever. She went to Urgent Care last weekend and was given a Zpack. Now she is no better.   Allergies (verified): No Known Drug Allergies  Past History:  Past Medical History: Reviewed history from 12/21/2009 and no changes required. Diabetes mellitus, type II Hyperlipidemia Hypertension Ovarian CA, hx of-1986 Osteoarthritis, has chronic right hip pain, sees Dr. August Saucer GERD normal stress Myoview and normal ECHO 08-18-08  Review of Systems  The patient denies anorexia, fever, weight loss, weight gain, vision loss, decreased hearing, hoarseness, chest pain, syncope, dyspnea on exertion, peripheral edema, headaches, hemoptysis, abdominal pain, melena, hematochezia, severe indigestion/heartburn, hematuria, incontinence, genital sores, muscle weakness, suspicious skin lesions, transient blindness, difficulty walking, depression, unusual weight change, abnormal bleeding, enlarged lymph nodes, angioedema, breast masses, and testicular masses.    Physical Exam  General:  Well-developed,well-nourished,in no acute distress; alert,appropriate and cooperative throughout examination Head:  Normocephalic and atraumatic without obvious abnormalities. No apparent alopecia or balding. Eyes:  No corneal or conjunctival inflammation noted. EOMI. Perrla. Funduscopic exam benign, without hemorrhages, exudates or papilledema. Vision grossly normal. Ears:  External ear exam shows no  significant lesions or deformities.  Otoscopic examination reveals clear canals, tympanic membranes are intact bilaterally without bulging, retraction, inflammation or discharge. Hearing is grossly normal bilaterally. Nose:  External nasal examination shows no deformity or inflammation. Nasal mucosa are pink and moist without lesions or exudates. Mouth:  Oral mucosa and oropharynx without lesions or exudates.  Teeth in good repair. Neck:  No deformities, masses, or tenderness noted. Lungs:  scattered rhonchi    Impression & Recommendations:  Problem # 1:  ACUTE BRONCHITIS (ICD-466.0)  Her updated medication list for this problem includes:    Augmentin 875-125 Mg Tabs (Amoxicillin-pot clavulanate) .Marland Kitchen..Marland Kitchen Two times a day    Hydromet 5-1.5 Mg/65ml Syrp (Hydrocodone-homatropine) .Marland Kitchen... 1 tsp q 4 hours as needed cough  Orders: Depo- Medrol 40mg  (J1030) Depo- Medrol 80mg  (J1040) Admin of Therapeutic Inj  intramuscular or subcutaneous (16109)  Complete Medication List: 1)  Metformin Hcl 500 Mg Tb24 (Metformin hcl) .Marland Kitchen.. 1 by mouth two times a day 2)  Metoprolol Tartrate 25 Mg Tabs (Metoprolol tartrate) .Marland Kitchen.. 1 by mouth two times a day 3)  Lisinopril-hydrochlorothiazide 20-25 Mg Tabs (Lisinopril-hydrochlorothiazide) .... Once daily 4)  Omeprazole 40 Mg Cpdr (Omeprazole) .Marland Kitchen.. 1 once daily 5)  Anusol-hc 25 Mg Supp (Hydrocortisone acetate) .... Insert 1 per rectum three times a day 6)  Lipitor 40 Mg Tabs (Atorvastatin calcium) .... Once daily 7)  Augmentin 875-125 Mg Tabs (Amoxicillin-pot clavulanate) .... Two times a day 8)  Hydromet 5-1.5 Mg/83ml Syrp (Hydrocodone-homatropine) .Marland Kitchen.. 1 tsp q 4 hours as needed cough  Patient Instructions: 1)  Please schedule a follow-up appointment as needed .  Prescriptions: HYDROMET 5-1.5 MG/5ML SYRP (HYDROCODONE-HOMATROPINE) 1 tsp q 4 hours  as needed cough  #240 x 0   Entered and Authorized by:   Nelwyn Salisbury MD   Signed by:   Nelwyn Salisbury MD on 07/23/2010    Method used:   Print then Give to Patient   RxID:   [id number] AUGMENTIN 875-125 MG TABS (AMOXICILLIN-POT CLAVULANATE) two times a day  #20 x 0   Entered and Authorized by:   Nelwyn Salisbury MD   Signed by:   Nelwyn Salisbury MD on 07/23/2010   Method used:   Print then Give to Patient   RxID:   [id number]    Medication Administration  Injection # 1:    Medication: Depo- Medrol 40mg     Diagnosis: ACUTE BRONCHITIS (ICD-466.0)    Route: IM    Site: RUOQ gluteus    Exp Date: 12/2012    Lot #: [id number]    Mfr: Pharmacia    Patient tolerated injection without complications    Given by: Pura Spice, RN (July 23, 2010 5:14 PM)  Injection # 2:    Medication: Depo- Medrol 80mg     Diagnosis: ACUTE BRONCHITIS (ICD-466.0)    Route: IM    Site: RUOQ gluteus    Exp Date: 12/2012    Lot #: [id number]    Mfr: Pharmacia    Patient tolerated injection without complications    Given by: Pura Spice, RN (July 23, 2010 5:15 PM)  Orders Added: 1)  Depo- Medrol 40mg  [J1030] 2)  Depo- Medrol 80mg  [J1040] 3)  Admin of Therapeutic Inj  intramuscular or subcutaneous [96372] 4)  Est. Patient Level IV [84696]

## 2010-08-01 NOTE — Progress Notes (Signed)
Summary: med probs.  Phone Note Call from Patient Call back at Home Phone [phone]   Caller: Patient Call For: Nelwyn Salisbury MD Summary of Call: Cannot take Codeine Cough Syrup.  Keeps her awake and feels like she is getting worse.  Leave a message [phone]  Wants stronger antibiotics and a different cough med. Initial call taken by: Lynann Beaver CMA AAMA,  July 26, 2010 10:21 AM  Follow-up for Phone Call        the Augmentin I gave her is about as strong as it gets, so she needs to give it more time to work. There is no other cough med for me to call in, if she cannot tolerate hydrocodone. Stick with Delsym  Follow-up by: Nelwyn Salisbury MD,  July 26, 2010 2:16 PM  Additional Follow-up for Phone Call Additional follow up Details #1::        mess left on her  cell phone advised to call  back if more questions Additional Follow-up by: Pura Spice, RN,  July 26, 2010 2:51 PM

## 2010-08-01 NOTE — Assessment & Plan Note (Signed)
Summary: ABD PAIN/NJR PT DECLINE TO SEE ANOTHER DOC/NJR   Vital Signs:  Patient profile:   45 year old female Weight:      237 pounds Temp:     98.0 degrees F oral BP sitting:   92 / 68  (left arm) Cuff size:   large  Vitals Entered By: Sid Falcon LPN (June 11, 2010 12:54 PM)  History of Present Illness: Here to discuss 2 months of frequent lower abdominal cramping and diarrhea, often after every meal. Now in the past few weeks she has seen a fair amount of bright red blood mixed with the diarrhea stools also. No heartburn or fever or nausea. She has stopped using any sodas or any spicy foods. Her main food lately has been oatmeal. She saw the Urgent Care in [facility] last week, and they did a rectal exam. She does have some hemorrhoids, but it was obvious to them that her bleeding is coming from higher up along the GI tract.   Allergies (verified): No Known Drug Allergies  Past History:  Past Medical History: Reviewed history from 12/21/2009 and no changes required. Diabetes mellitus, type II Hyperlipidemia Hypertension Ovarian CA, hx of-1986 Osteoarthritis, has chronic right hip pain, sees Dr. August Saucer GERD normal stress Myoview and normal ECHO 08-18-08  Past Surgical History: Reviewed history from 12/21/2009 and no changes required. TAH/ BSO with cervix still intact 1986 Melanoma removed- upper chest-1997 per Dr. Elmon Else Hemorrhoidectomy per Dr. Jimmye Norman  Family History: Reviewed history from 01/22/2007 and no changes required. Family History of Arthritis Family History Diabetes 1st degree relative Family History Other cancer-Colon, Prostate,Breast Family History of Cardiovascular disorder  Review of Systems  The patient denies anorexia, fever, weight loss, weight gain, vision loss, decreased hearing, hoarseness, chest pain, syncope, dyspnea on exertion, peripheral edema, prolonged cough, headaches, hemoptysis, melena, severe indigestion/heartburn,  hematuria, incontinence, genital sores, muscle weakness, suspicious skin lesions, transient blindness, difficulty walking, depression, unusual weight change, abnormal bleeding, enlarged lymph nodes, angioedema, breast masses, and testicular masses.    Physical Exam  General:  Well-developed,well-nourished,in no acute distress; alert,appropriate and cooperative throughout examination Lungs:  Normal respiratory effort, chest expands symmetrically. Lungs are clear to auscultation, no crackles or wheezes. Heart:  Normal rate and regular rhythm. S1 and S2 normal without gallop, murmur, click, rub or other extra sounds. Abdomen:  soft, normal bowel sounds, no distention, no masses, no guarding, no rigidity, no rebound tenderness, no abdominal hernia, no inguinal hernia, no hepatomegaly, and no splenomegaly.  Moderately tender in the RLQ.    Impression & Recommendations:  Problem # 1:  ABDOMINAL PAIN RIGHT LOWER QUADRANT (ICD-789.03)  Orders: Venipuncture (21308) TLB-BMP (Basic Metabolic Panel-BMET) (80048-METABOL) TLB-CBC Platelet - w/Differential (85025-CBCD) TLB-Hepatic/Liver Function Pnl (80076-HEPATIC) TLB-Amylase (82150-AMYL) Specimen Handling (65784)  Problem # 2:  BLOOD IN STOOL (ICD-578.1)  Complete Medication List: 1)  Metformin Hcl 500 Mg Tb24 (Metformin hcl) .Marland Kitchen.. 1 by mouth two times a day 2)  Metoprolol Tartrate 25 Mg Tabs (Metoprolol tartrate) .Marland Kitchen.. 1 by mouth two times a day 3)  Lisinopril-hydrochlorothiazide 20-25 Mg Tabs (Lisinopril-hydrochlorothiazide) .... Once daily 4)  Omeprazole 40 Mg Cpdr (Omeprazole) .Marland Kitchen.. 1 once daily 5)  Anusol-hc 25 Mg Supp (Hydrocortisone acetate) .... Insert 1 per rectum three times a day 6)  Lipitor 40 Mg Tabs (Atorvastatin calcium) .... Once daily  Patient Instructions: 1)  this is consistent with colitis or inflammatory bowel disease. Get labs today. Refer to GI soon.    Orders Added: 1)  Est.  Patient Level IV [78295] 2)  Venipuncture  [36415] 3)  TLB-BMP (Basic Metabolic Panel-BMET) [80048-METABOL] 4)  TLB-CBC Platelet - w/Differential [85025-CBCD] 5)  TLB-Hepatic/Liver Function Pnl [80076-HEPATIC] 6)  TLB-Amylase [82150-AMYL] 7)  Specimen Handling [99000]

## 2010-08-01 NOTE — Letter (Signed)
Summary: New Patient letter  [facility]  [address] [place], [place] [address]   Phone: [phone]  Fax: [phone]       06/12/2010 MRN: [id number]  Morgan [name] [address] [place], [place]  [address]  Dear Morgan [name],  Welcome to the Gastroenterology Division at [facility].    You are scheduled to see Dr.  Jarold Motto on 07-20-2011 at 8:30am on the 3rd floor at [facility], [address]  We ask that you try to arrive at our office 15 minutes prior to your appointment time to allow for check-in.  We would like you to complete the enclosed self-administered evaluation form prior to your visit and bring it with you on the day of your appointment.  We will review it with you.  Also, please bring a complete list of all your medications or, if you prefer, bring the medication bottles and we will list them.  Please bring your insurance card so that we may make a copy of it.  If your insurance requires a referral to see a specialist, please bring your referral form from your primary care physician.  Co-payments are due at the time of your visit and may be paid by cash, check or credit card.     Your office visit will consist of a consult with your physician (includes a physical exam), any laboratory testing he/she may order, scheduling of any necessary diagnostic testing (e.g. x-ray, ultrasound, CT-scan), and scheduling of a procedure (e.g. Endoscopy, Colonoscopy) if required.  Please allow enough time on your schedule to allow for any/all of these possibilities.    If you cannot keep your appointment, please call [phone] to cancel or reschedule prior to your appointment date.  This allows [place] the opportunity to schedule an appointment for another patient in need of care.  If you do not cancel or reschedule by 5 p.m. the business day prior to your appointment date, you will be charged a $50.00 late cancellation/no-show fee.    Thank  you for choosing [facility] for your medical needs.  We appreciate the opportunity to care for you.  Please visit [place] at our website  to learn more about our practice.                     Sincerely,                                                             The Gastroenterology Division

## 2010-08-24 ENCOUNTER — Emergency Department (HOSPITAL_COMMUNITY)
Admission: EM | Admit: 2010-08-24 | Discharge: 2010-08-24 | Disposition: A | Discharge status: Home / Self Care | Payer: 59 | Attending: Emergency Medicine | Admitting: Emergency Medicine

## 2010-08-24 DIAGNOSIS — H538 Other visual disturbances: Secondary | ICD-10-CM | POA: Insufficient documentation

## 2010-08-24 DIAGNOSIS — H547 Unspecified visual loss: Secondary | ICD-10-CM | POA: Insufficient documentation

## 2010-08-24 DIAGNOSIS — Z79899 Other long term (current) drug therapy: Secondary | ICD-10-CM | POA: Insufficient documentation

## 2010-08-24 DIAGNOSIS — E119 Type 2 diabetes mellitus without complications: Secondary | ICD-10-CM | POA: Insufficient documentation

## 2010-08-24 DIAGNOSIS — I10 Essential (primary) hypertension: Secondary | ICD-10-CM | POA: Insufficient documentation

## 2010-08-24 DIAGNOSIS — H539 Unspecified visual disturbance: Secondary | ICD-10-CM | POA: Insufficient documentation

## 2010-08-24 DIAGNOSIS — Z8543 Personal history of malignant neoplasm of ovary: Secondary | ICD-10-CM | POA: Insufficient documentation

## 2010-08-26 ENCOUNTER — Telehealth: Payer: Self-pay | Admitting: Family Medicine

## 2010-08-26 NOTE — Telephone Encounter (Signed)
Pt still has cough can not take cough med with codeine, Pt would like a refill on amoxicillin call into [facility] [phone]

## 2010-08-27 NOTE — Telephone Encounter (Signed)
No, I saw her over a month ago. Needs an OV

## 2010-09-02 NOTE — Telephone Encounter (Signed)
Pt no longer needs ov

## 2010-09-05 ENCOUNTER — Other Ambulatory Visit: Payer: Self-pay | Admitting: *Deleted

## 2010-09-05 NOTE — Telephone Encounter (Signed)
patient  Would like to know if there is something cheaper than lipitor she can try.  [facility]

## 2010-09-06 NOTE — Telephone Encounter (Signed)
No, it is already generic. Should be quite affordable

## 2010-09-10 ENCOUNTER — Telehealth: Payer: Self-pay | Admitting: *Deleted

## 2010-09-10 LAB — CBC
HCT: 37.6 % (ref 36.0–46.0)
Hemoglobin: 12.8 g/dL (ref 12.0–15.0)
MCH: 29.2 pg (ref 26.0–34.0)
MCHC: 34 g/dL (ref 30.0–36.0)
MCV: 85.6 fL (ref 78.0–100.0)
Platelets: 372 10*3/uL (ref 150–400)
RBC: 4.39 MIL/uL (ref 3.87–5.11)
RDW: 13.9 % (ref 11.5–15.5)
WBC: 11 10*3/uL — ABNORMAL HIGH (ref 4.0–10.5)

## 2010-09-10 LAB — DIFFERENTIAL
Basophils Absolute: 0 10*3/uL (ref 0.0–0.1)
Basophils Relative: 0 % (ref 0–1)
Eosinophils Absolute: 0.1 10*3/uL (ref 0.0–0.7)
Eosinophils Relative: 1 % (ref 0–5)
Lymphocytes Relative: 30 % (ref 12–46)
Lymphs Abs: 3.3 10*3/uL (ref 0.7–4.0)
Monocytes Absolute: 0.6 10*3/uL (ref 0.1–1.0)
Monocytes Relative: 6 % (ref 3–12)
Neutro Abs: 7 10*3/uL (ref 1.7–7.7)
Neutrophils Relative %: 63 % (ref 43–77)

## 2010-09-10 LAB — HEMOCCULT GUIAC POC 1CARD (OFFICE): Fecal Occult Bld: POSITIVE

## 2010-09-10 LAB — URINALYSIS, ROUTINE W REFLEX MICROSCOPIC
Bilirubin Urine: NEGATIVE
Glucose, UA: NEGATIVE mg/dL
Ketones, ur: NEGATIVE mg/dL
Leukocytes, UA: NEGATIVE
Nitrite: NEGATIVE
Protein, ur: NEGATIVE mg/dL
Specific Gravity, Urine: 1.027 (ref 1.005–1.030)
Urobilinogen, UA: 0.2 mg/dL (ref 0.0–1.0)
pH: 5.5 (ref 5.0–8.0)

## 2010-09-10 LAB — COMPREHENSIVE METABOLIC PANEL
ALT: 37 U/L — ABNORMAL HIGH (ref 0–35)
AST: 25 U/L (ref 0–37)
Albumin: 4.5 g/dL (ref 3.5–5.2)
Alkaline Phosphatase: 97 U/L (ref 39–117)
BUN: 26 mg/dL — ABNORMAL HIGH (ref 6–23)
CO2: 27 mEq/L (ref 19–32)
Calcium: 10 mg/dL (ref 8.4–10.5)
Chloride: 102 mEq/L (ref 96–112)
Creatinine, Ser: 1 mg/dL (ref 0.4–1.2)
GFR calc Af Amer: >60 mL/min (ref >60)
GFR calc non Af Amer: 60 mL/min — ABNORMAL LOW (ref >60)
Glucose, Bld: 111 mg/dL — ABNORMAL HIGH (ref 70–99)
Potassium: 3.4 mEq/L — ABNORMAL LOW (ref 3.5–5.1)
Sodium: 143 mEq/L (ref 135–145)
Total Bilirubin: 0.8 mg/dL (ref 0.3–1.2)
Total Protein: 8.1 g/dL (ref 6.0–8.3)

## 2010-09-10 LAB — URINE MICROSCOPIC-ADD ON

## 2010-09-10 NOTE — Telephone Encounter (Signed)
Notified pt. 

## 2010-09-10 NOTE — Telephone Encounter (Signed)
Message left on voice mail.

## 2010-09-10 NOTE — Telephone Encounter (Signed)
Error

## 2010-09-15 LAB — URINALYSIS, ROUTINE W REFLEX MICROSCOPIC
Bilirubin Urine: NEGATIVE
Glucose, UA: NEGATIVE mg/dL
Hgb urine dipstick: NEGATIVE
Ketones, ur: NEGATIVE mg/dL
Nitrite: NEGATIVE
Protein, ur: NEGATIVE mg/dL
Specific Gravity, Urine: 1.029 (ref 1.005–1.030)
Urobilinogen, UA: 0.2 mg/dL (ref 0.0–1.0)
pH: 5.5 (ref 5.0–8.0)

## 2010-09-25 ENCOUNTER — Encounter: Payer: Self-pay | Admitting: Family Medicine

## 2010-09-26 ENCOUNTER — Telehealth: Payer: Self-pay | Admitting: *Deleted

## 2010-09-26 NOTE — Telephone Encounter (Signed)
[facility] Triage Call Report Triage Record Num: [id number] Operator: Alphonsa Overall Patient Name: Morgan [name] Call Date & Time: 09/25/2010 10:51:02PM Patient Phone: [phone] PCP: Tera Mater. Clent Ridges Patient Gender: Female PCP Fax : [phone] Patient DOB: 1966/06/04 Practice Name: [facility] Reason for Call: [name] calling about sleep concerns and depression. Onset 09/25/10. Sees psychiatrist monthy. BS 145 at 2200 normal in am 120s-150s. No change in meds. Not able to sleep well at night. Having trouble going to sleep. New stressors in pt's life recently. Pt with new agitation and frustration,hx of sleep apnea. [name] aware needs to call MD office 09/26/10 for sleep concern. Protocol(s) Used: Sleep Disorders Recommended Outcome per Protocol: See Provider within 24 hours Reason for Outcome: Sleep deprivation AND irritable, agitated or multiple episodes of apnea per night Care Advice: ~ Another adult should drive. ~ Should not be alone, arrange for support (family member, friend, etc.). ~ Call provider if symptoms worsen or new symptoms develop. Avoid the use of stimulants including caffeine (coffee, some soft drinks, some energy drinks, tea and chocolate), cocaine, and amphetamines. Also avoid drinking alcohol. ~ ~ SYMPTOM / CONDITION MANAGEMENT ~ List, or take, all current prescription(s), nonprescription or alternative medication(s) to provider for evaluation. 09/25/2010 11:10:01PM Page 1 of 1 [email or web address]

## 2010-10-06 LAB — URINALYSIS, ROUTINE W REFLEX MICROSCOPIC
Bilirubin Urine: NEGATIVE
Glucose, UA: NEGATIVE mg/dL
Hgb urine dipstick: NEGATIVE
Ketones, ur: 15 mg/dL — AB
Nitrite: NEGATIVE
Protein, ur: NEGATIVE mg/dL
Specific Gravity, Urine: 1.038 — ABNORMAL HIGH (ref 1.005–1.030)
Urobilinogen, UA: 0.2 mg/dL (ref 0.0–1.0)
pH: 5 (ref 5.0–8.0)

## 2010-10-06 LAB — CBC
HCT: 40.6 % (ref 36.0–46.0)
Hemoglobin: 13.8 g/dL (ref 12.0–15.0)
MCHC: 33.9 g/dL (ref 30.0–36.0)
MCV: 86.5 fL (ref 78.0–100.0)
Platelets: 342 10*3/uL (ref 150–400)
RBC: 4.69 MIL/uL (ref 3.87–5.11)
RDW: 13.5 % (ref 11.5–15.5)
WBC: 9.2 10*3/uL (ref 4.0–10.5)

## 2010-10-06 LAB — HEMOCCULT GUIAC POC 1CARD (OFFICE): Fecal Occult Bld: POSITIVE

## 2010-10-15 LAB — BASIC METABOLIC PANEL
BUN: 15 mg/dL (ref 6–23)
CO2: 24 mEq/L (ref 19–32)
Calcium: 9.2 mg/dL (ref 8.4–10.5)
Chloride: 105 mEq/L (ref 96–112)
Creatinine, Ser: 0.84 mg/dL (ref 0.4–1.2)
GFR calc Af Amer: >60 mL/min (ref >60)
GFR calc non Af Amer: >60 mL/min (ref >60)
Glucose, Bld: 124 mg/dL — ABNORMAL HIGH (ref 70–99)
Potassium: 4 mEq/L (ref 3.5–5.1)
Sodium: 140 mEq/L (ref 135–145)

## 2010-10-15 LAB — CBC
HCT: 37.3 % (ref 36.0–46.0)
HCT: 40.1 % (ref 36.0–46.0)
Hemoglobin: 13.3 g/dL (ref 12.0–15.0)
Hemoglobin: 13.8 g/dL (ref 12.0–15.0)
MCHC: 34.5 g/dL (ref 30.0–36.0)
MCHC: 35.5 g/dL (ref 30.0–36.0)
MCV: 85.7 fL (ref 78.0–100.0)
MCV: 87.1 fL (ref 78.0–100.0)
Platelets: 157 10*3/uL (ref 150–400)
Platelets: 336 10*3/uL (ref 150–400)
RBC: 4.35 MIL/uL (ref 3.87–5.11)
RBC: 4.6 MIL/uL (ref 3.87–5.11)
RDW: 14 % (ref 11.5–15.5)
RDW: 14.2 % (ref 11.5–15.5)
WBC: 7 10*3/uL (ref 4.0–10.5)
WBC: 9.7 10*3/uL (ref 4.0–10.5)

## 2010-10-15 LAB — COMPREHENSIVE METABOLIC PANEL
ALT: 29 U/L (ref 0–35)
AST: 27 U/L (ref 0–37)
Albumin: 3.7 g/dL (ref 3.5–5.2)
Alkaline Phosphatase: 77 U/L (ref 39–117)
BUN: 18 mg/dL (ref 6–23)
CO2: 22 mEq/L (ref 19–32)
Calcium: 9.5 mg/dL (ref 8.4–10.5)
Chloride: 105 mEq/L (ref 96–112)
Creatinine, Ser: 0.99 mg/dL (ref 0.4–1.2)
GFR calc Af Amer: >60 mL/min (ref >60)
GFR calc non Af Amer: >60 mL/min (ref >60)
Glucose, Bld: 132 mg/dL — ABNORMAL HIGH (ref 70–99)
Potassium: 4.1 mEq/L (ref 3.5–5.1)
Sodium: 138 mEq/L (ref 135–145)
Total Bilirubin: 0.7 mg/dL (ref 0.3–1.2)
Total Protein: 7.3 g/dL (ref 6.0–8.3)

## 2010-10-15 LAB — GLUCOSE, CAPILLARY
Glucose-Capillary: 112 mg/dL — ABNORMAL HIGH (ref 70–99)
Glucose-Capillary: 116 mg/dL — ABNORMAL HIGH (ref 70–99)
Glucose-Capillary: 117 mg/dL — ABNORMAL HIGH (ref 70–99)
Glucose-Capillary: 130 mg/dL — ABNORMAL HIGH (ref 70–99)
Glucose-Capillary: 135 mg/dL — ABNORMAL HIGH (ref 70–99)
Glucose-Capillary: 145 mg/dL — ABNORMAL HIGH (ref 70–99)
Glucose-Capillary: 179 mg/dL — ABNORMAL HIGH (ref 70–99)

## 2010-10-15 LAB — HEMOGLOBIN A1C
Hgb A1c MFr Bld: 6.4 % — ABNORMAL HIGH (ref 4.6–6.1)
Mean Plasma Glucose: 137 mg/dL

## 2010-10-15 LAB — CARDIAC PANEL(CRET KIN+CKTOT+MB+TROPI)
CK, MB: 0.9 ng/mL (ref 0.3–4.0)
CK, MB: 0.9 ng/mL (ref 0.3–4.0)
Relative Index: INVALID (ref 0.0–2.5)
Relative Index: INVALID (ref 0.0–2.5)
Total CK: 50 U/L (ref 7–177)
Total CK: 56 U/L (ref 7–177)
Troponin I: <0.01 ng/mL (ref 0.00–0.06)
Troponin I: <0.01 ng/mL (ref 0.00–0.06)

## 2010-10-15 LAB — POCT CARDIAC MARKERS
CKMB, poc: <1 ng/mL — ABNORMAL LOW (ref 1.0–8.0)
Myoglobin, poc: 82.6 ng/mL (ref 12–200)
Troponin i, poc: <0.05 ng/mL (ref 0.00–0.09)

## 2010-10-15 LAB — D-DIMER, QUANTITATIVE: D-Dimer, Quant: 0.25 ug/mL-FEU (ref 0.00–0.48)

## 2010-10-15 LAB — CK TOTAL AND CKMB (NOT AT ARMC)
CK, MB: 1.1 ng/mL (ref 0.3–4.0)
Relative Index: INVALID (ref 0.0–2.5)
Total CK: 33 U/L (ref 7–177)

## 2010-10-15 LAB — PROTIME-INR
INR: 0.9 (ref 0.00–1.49)
Prothrombin Time: 11.9 seconds (ref 11.6–15.2)

## 2010-10-15 LAB — APTT: aPTT: 28 seconds (ref 24–37)

## 2010-10-15 LAB — TROPONIN I: Troponin I: <0.01 ng/mL (ref 0.00–0.06)

## 2010-11-08 ENCOUNTER — Other Ambulatory Visit: Payer: Self-pay

## 2010-11-08 MED ORDER — LISINOPRIL-HYDROCHLOROTHIAZIDE 20-25 MG PO TABS: 1.0000 | ORAL_TABLET | Freq: Every day | ORAL | Status: DC

## 2010-11-08 NOTE — Telephone Encounter (Signed)
rx called in to [facility] lisinopril-hctz 20-25 x1

## 2010-11-12 NOTE — Discharge Summary (Signed)
Morgan [name], Morgan [name]           ACCOUNT NO.:  [id number]   MEDICAL RECORD NO.:  [id number]          PATIENT TYPE:  OBV   LOCATION:  4705                         FACILITY:  [facility]   PHYSICIAN:  Valerie A. Felicity Coyer, MDDATE OF BIRTH:  1966/01/13   DATE OF ADMISSION:  08/16/2008  DATE OF DISCHARGE:  08/18/2008                               DISCHARGE SUMMARY   DISCHARGE DIAGNOSES:  1. Chest pain atypical for multiple risk factors for coronary artery      disease, negative inpatient evaluation.  2. History of gastroesophageal reflux disease with presumed esophageal      spasm contributing to pain as above.  Continue daily reflux      treatment for 2 weeks with outpatient followup with primary care as      listed.  3. Morbid obesity.  4. Type 2 diabetes.  5. Hypertension.  6. Dyslipidemia.   DISCHARGE MEDICATIONS:  1. Aspirin 81 mg daily.  2. Zantac 75 mg p.o. b.i.d. scheduled, not just p.r.n. indigestion or      the patient is instructed may take Prilosec 20 mg daily in place of      Zantac, but again must take scheduled x2 weeks until further      followup with primary MD.   Other medications are as prior to admission include:  1. Metformin 500 mg p.o. b.i.d.  2. Metoprolol 25 mg p.o. b.i.d.  3. Simvastatin 80 mg p.o. daily.   DISPOSITION:  The patient is discharged home in medically stable and  improved condition.  She has been asymptomatic and without recurrence of  her pain symptoms since admission.  Hospital followup is with primary  care physician, Dr. Gershon Crane next week for Wednesday August 23, 2008, at 1 o'clock p.m.   Consults and procedures this hospitalization include [facility]  with a stress Myoview performed on August 18, 2008, results of  perfusion imaging pending at the time of this dictation.   CONDITION ON DISCHARGE:  Medically stable and improved.   HOSPITAL COURSE PER PROBLEM:  Chest pain.  The patient is a 45 year old  overweight woman  with multiple risk factors for atherosclerotic disease  who presented to the emergency room morning of admission due to crushing  substernal chest tightness, which was unlike any previous episodes of  reflux that she had experienced, new to concerns for possible coronary  etiology.  She was brought to the emergency room for further evaluation.  There, initial workup was negative, but because of the nature of her  pain and risk factors, she was admitted for further evaluation.  She was  admitted to telemetry where no arrhythmias were identified.  Cardiac  enzymes were cycled and negative for evidence of ischemia.  However,  again because of her risk factors, she was seen in consultation by  Cardiology, Dr. Riley Kill, who after review of serial EKGs felt there is  mild anterior T-wave flattening concerning for possible low-grade  ischemia.  As such she was scheduled for an inpatient stress Myoview  performed on August 18, 2008.  A 2D echo was also performed, which  showed normal LV function.  If the perfusion imaging is within normal  limits, she will be discharged home later today pending these results.  She is encouraged to continue on low-dose aspirin therapy daily for  treatment of underlying atherosclerotic disease progression as well as  counseled on continued risk factor modification most importantly that of  weight loss and exercise.  She will continue on her home statin,  metformin, and beta-blocker as prior to admission for dyslipidemia,  diabetes, and hypertension respectively.  Of note due to concern for  possible gallbladder etiology for her pain, an ultrasound was performed  which showed no evidence of stones or hepatobiliary abnormality.  She  will be continued on scheduled reflux medication in the form of either  b.i.d. H2 blocker or once daily PPI, which she is reminded again to be  taken scheduled and not just p.r.n. reflux symptoms for at least a 2-  week trial to see if  this helps with the recurrence of any such chest  pain.  She will have close followup with her primary care physician on  these issues and much of the day of discharge was spent in coordination  of discussion in the presence of her mother regarding need for lifestyle  change to include increased exercise, better dietary habits, attention  to her blood sugars, and efforts at weight control with weight loss.      Valerie A. Felicity Coyer, MD  Electronically Signed     VAL/MEDQ  D:  08/18/2008  T:  08/19/2008  Job:  [id number]

## 2010-11-12 NOTE — Consult Note (Signed)
NAMESHAKEISHA, HORINE           ACCOUNT NO.:  [id number]   MEDICAL RECORD NO.:  [id number]          PATIENT TYPE:  OBV   LOCATION:  4705                         FACILITY:  [facility]   PHYSICIAN:  Arturo Morton. Riley Kill, MD, FACCDATE OF BIRTH:  02-25-1966   DATE OF CONSULTATION:  08/16/2008  DATE OF DISCHARGE:                                 CONSULTATION   REQUESTING PHYSICIAN:  Willow Ora, MD   PRIMARY CARE PHYSICIAN:  Tera Mater. Clent Ridges, MD   CARDIOLOGIST:  Will be new, Arturo Morton. Riley Kill, MD, [facility]   REASON FOR CONSULTATION:  Chest pain.   HISTORY OF PRESENT ILLNESS:  A 45 year old obese Caucasian female with a  known history of hypertension, diabetes, and hyperlipidemia who last  night was awakened by severe chest pressure, midsternal, like a brick  on my chest pushing, difficult to take deep breaths.  The episode  lasted approximately 4-5 minutes around 3:00 a.m.  She asked her mother  to call 911 and over the phone, she was told to take 4 baby aspirin  which she did.  She did have some associated diaphoresis and  palpitations.  By that time EMS got there, the pain had gone and she was  feeling better.  The pain was nonradiating and did not induce vomiting  or nausea or presyncope.   The patient has had a history of dysphagia over the last 2-3 months  which she describes her food does not always go all the way down and  gets stuck.  She also had episodes of constipation within the last 24  hours for which she was taking multiple laxatives and she had some gas  pain yesterday as well.  The patient currently is in the ER, resting  without any complaints of chest pain at this time.   REVIEW OF SYSTEMS:  Positive for severe midsternal chest pressure  nonradiating, positive for right hip arthralgia, positive for  constipation, positive for dysphasia, positive for more fatigue over the  last several months.  All other systems have been reviewed and are found  to be negative.   PAST MEDICAL  HISTORY:  1. Hypertension x8 years.  2. Diabetes x4 years.  3. Hyperlipidemia.  4. Ovarian cancer.  5. Hemorrhoids.  6. Right hip chronic pain.   PAST SURGICAL HISTORY:  Melanoma removed from chest, total abdominal  hysterectomy and BSO.   SOCIAL HISTORY:  She lives in [place] with her mother.  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  She is single.  She has no  children.  She does not smoke, does not drink alcohol.  She does not use  drugs.   FAMILY HISTORY:  Mother with depression, asthma, and hypertension.  Father deceased with a diagnosis of MS.  She has 2 brothers with  diabetes.  A sister deceased from cancer and another sister living in  good health.   CURRENT LABORATORY DATA:  Sodium 138, potassium 4.1, chloride 105, CO2  of 22, BUN 18, creatinine 0.99, and glucose 132.  Hemoglobin 13.8,  hematocrit 40.1, white blood cells 9.7, and platelets 336.  LFTs within  normal  limits.  D-dimer 0.25 and troponin less than 0.05.  PTT 28, PT  11.9, and INR 0.9.  Chest x-ray; no active cardiopulmonary disease.  EKG; normal sinus rhythm, ventricular rate of 88 beats per minute with  nonspecific T-wave abnormalities.   PHYSICAL EXAMINATION:  VITAL SIGNS:  Blood pressure 147/97, pulse 88,  respirations 18, temperature 97.7, and O2 saturation 100% on room air.  GENERAL:  She is awake, alert, and oriented, no acute distress.  HEENT:  Head is normocephalic and atraumatic.  Eyes; PERRLA.  Mucous  membranes; mouth pink and moist.  Tongue is midline.  NECK:  There are no carotid bruits.  No JVD seen.  CARDIOVASCULAR:  Regular rate and rhythm without murmurs, rubs, or  gallops.  Pulses are 2+ and equal without bruits.  LUNGS:  Clear to auscultation without wheezes, rales, or rhonchi.  ABDOMEN:  Soft.  Positive Murphy sign is elicited with palpation, 2+  bowel sounds.  EXTREMITIES:  Without clubbing or cyanosis.  There is some trace lower  extremity edema noted bilaterally.   NEURO:  Cranial nerves II through XII are grossly intact.   IMPRESSION:  1. Chest pain in a patient with multiple cardiovascular risk factors,      negative cardiac enzymes, and EKG initially normal.  Symptoms are      typical and atypical with some worry of unstable angina.  2. Diabetes.  3. Hypertension.  4. Hypercholesterolemia.  5. Positive Murphy sign.   PLAN:  A 45 year old obese Caucasian female with multiple cardiovascular  risk factors who presents with sudden onset of severe chest pressure,  waking her up last night at about 3:00 a.m., relieved approximately  after 5 minutes and taking 4 baby aspirin.  The patient is pain free at  present.  The patient has been seen and examined by myself and Dr.  Riley Kill in the ER.  The patient's EKG is nonspecific.  Agree with serial  EKG and enzymes and gallbladder ultrasound.  If negative, we would plan  an outpatient stress Myoview in the setting of a patient with multiple  cardiovascular risk factors.  If positive, the patient will benefit from  cardiac catheterization.  We will make further recommendations  throughout hospital course depending on the patient's response to  treatment.  We would also evaluate for gallbladder disease with positive  Murphy's which will be left to the discretion of primary.      Bettey Mare. Lyman [name], NP      Arturo Morton. Riley Kill, MD, [facility]  Electronically Signed   KML/MEDQ  D:  08/16/2008  T:  08/16/2008  Job:  [id number]   cc:   Jeannett Senior A. Clent Ridges, MD

## 2010-11-13 ENCOUNTER — Other Ambulatory Visit: Payer: Self-pay | Admitting: Orthopaedic Surgery

## 2010-11-13 ENCOUNTER — Ambulatory Visit (HOSPITAL_COMMUNITY)
Admission: RE | Admit: 2010-11-13 | Discharge: 2010-11-13 | Disposition: A | Discharge status: Home / Self Care | Payer: 59 | Source: Ambulatory Visit | Attending: Orthopaedic Surgery | Admitting: Orthopaedic Surgery

## 2010-11-13 ENCOUNTER — Encounter (HOSPITAL_COMMUNITY): Payer: 59

## 2010-11-13 ENCOUNTER — Other Ambulatory Visit (HOSPITAL_COMMUNITY): Payer: Self-pay | Admitting: Orthopaedic Surgery

## 2010-11-13 DIAGNOSIS — Z01812 Encounter for preprocedural laboratory examination: Secondary | ICD-10-CM | POA: Insufficient documentation

## 2010-11-13 DIAGNOSIS — Z01810 Encounter for preprocedural cardiovascular examination: Secondary | ICD-10-CM | POA: Insufficient documentation

## 2010-11-13 DIAGNOSIS — Z01818 Encounter for other preprocedural examination: Secondary | ICD-10-CM

## 2010-11-13 DIAGNOSIS — R9431 Abnormal electrocardiogram [ECG] [EKG]: Secondary | ICD-10-CM | POA: Insufficient documentation

## 2010-11-13 LAB — CBC
HCT: 39.9 % (ref 36.0–46.0)
Hemoglobin: 13.4 g/dL (ref 12.0–15.0)
MCH: 29.7 pg (ref 26.0–34.0)
MCHC: 33.6 g/dL (ref 30.0–36.0)
MCV: 88.5 fL (ref 78.0–100.0)
Platelets: 376 10*3/uL (ref 150–400)
RBC: 4.51 MIL/uL (ref 3.87–5.11)
RDW: 13.9 % (ref 11.5–15.5)
WBC: 8.8 10*3/uL (ref 4.0–10.5)

## 2010-11-13 LAB — URINALYSIS, ROUTINE W REFLEX MICROSCOPIC
Bilirubin Urine: NEGATIVE
Glucose, UA: NEGATIVE mg/dL
Hgb urine dipstick: NEGATIVE
Ketones, ur: NEGATIVE mg/dL
Nitrite: NEGATIVE
Protein, ur: NEGATIVE mg/dL
Specific Gravity, Urine: 1.025 (ref 1.005–1.030)
Urobilinogen, UA: 0.2 mg/dL (ref 0.0–1.0)
pH: 6 (ref 5.0–8.0)

## 2010-11-13 LAB — BASIC METABOLIC PANEL
BUN: 20 mg/dL (ref 6–23)
CO2: 28 mEq/L (ref 19–32)
Calcium: 9.2 mg/dL (ref 8.4–10.5)
Chloride: 99 mEq/L (ref 96–112)
Creatinine, Ser: 0.73 mg/dL (ref 0.4–1.2)
GFR calc Af Amer: >60 mL/min (ref >60)
GFR calc non Af Amer: >60 mL/min (ref >60)
Glucose, Bld: 120 mg/dL — ABNORMAL HIGH (ref 70–99)
Potassium: 3.8 mEq/L (ref 3.5–5.1)
Sodium: 136 mEq/L (ref 135–145)

## 2010-11-13 LAB — SURGICAL PCR SCREEN: Staphylococcus aureus: POSITIVE — AB

## 2010-11-13 LAB — PROTIME-INR
INR: 0.96 (ref 0.00–1.49)
Prothrombin Time: 13 seconds (ref 11.6–15.2)

## 2010-11-13 LAB — APTT: aPTT: 32 seconds (ref 24–37)

## 2010-11-15 NOTE — Assessment & Plan Note (Signed)
[facility] OFFICE NOTE   NAME:[name], Morgan CLAUS                  MRN:          [id number]  DATE:02/06/2006                            DOB:          [birth year]    This is a 45 year old woman here to establish with our practice.  She had  previously seen Dr. Romero Belling for primary care, but would like to switch  to [place]   INCOMPLETE DICTATION                                   Jeannett Senior A. Clent Ridges, MD   SAF/MedQ  DD:  02/06/2006  DT:  02/07/2006  Job #:  [id number]

## 2010-11-15 NOTE — Assessment & Plan Note (Signed)
[facility] OFFICE NOTE   NAME:Morgan [name], Morgan [name]                  MRN:          [id number]  DATE:02/06/2006                            DOB:          May 15, 1966    This is a 45 year old woman here to establish with our practice.  She had  previously seen Sean A. Everardo All, MD, for primary care but is now switching  to [place].  In general, she feels fine but is asking for a referral to a  nutritionist to help with her sugars.  She was diagnosed with diabetes about  1 year ago and apparently it is fairly mild.  If anything, she has more  trouble with low blood sugars than she does with high.  She says she watches  her diet fairly closely and eats frequent snacks throughout the day;  however, she still has frequent drops to the 50s and 60s with her blood  glucoses.  She feels bad, shaky, nauseated, etc., Whenever this happens.  On  the high end her sugars may range up to the 150s but generally are fairly  stable in the range of 110-120 both in the mornings and in the evening.  Six  months ago her hemoglobin A1c was 5.6 and apparently all of her other blood  work was fairly reasonable.   PAST MEDICAL HISTORY:  As noted above, she has type 2 diabetes.  She also  has hyperlipidemia and hypertension.  She had a melanoma removed from the  upper chest in 1997.  She sees Dr. Elmon Else on a regular basis for  dermatology checks.  She was diagnosed with ovarian cancer in 1986 and has  undergone  a TAH and BSO.  She had seen Dr. Everardo All for gynecological  examinations as well.  She had a normal pelvic exam and a normal mammogram  in April of this year.  She is a G0, P0.   ALLERGIES:  None.   CURRENT MEDICATIONS:  1. Metformin 500 mg b.i.d.  2. Metoprolol 25 mg per day.  3. Simvastatin 80 mg per day.   HABITS:  She smokes a cigarette rarely.  She does not use alcohol at all.   SOCIAL HISTORY:  She is single.  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   FAMILY HISTORY:  Remarkable for arthritis in her parents, diabetes in her  brother, also colon cancer, breast cancer, prostate cancer and heart disease  in her grandparents.   OBJECTIVE:  VITAL SIGNS:  Height 5 feet 5 inches, weight 252.  BP 124/84,  pulse 74 and regular.  GENERAL:  She is quite obese.  NECK:  Supple without masses or thyromegaly.  CHEST:  Lungs were clear.  CARDIAC:  Rate and rhythm regular without gallops, murmurs or rubs.  Distal  pulses are full.  EXTREMITIES:  No edema.  Examination of both feet shows that they are warm,  pink, have good pulses, and are neurologically intact.   ASSESSMENT AND PLAN:  1. Type 2 diabetes mellitus with some hypoglycemic spells.  Will decrease  her metformin to one-half of a tablet b.i.d.  Will check baseline lab      work early next week when she is fasting and will refer her to a      nutritionist for counseling.  2. Hypertension.  Stable.  3. Hyperlipidemia.  Will check a fasting lipid panel next week.                                   Tera Mater. Clent Ridges, MD   SAF/MedQ  DD:  02/06/2006  DT:  02/07/2006  Job #:  [id number]

## 2010-11-15 NOTE — Procedures (Signed)
Morgan [name], PLOCH NO.:  [id number]   MEDICAL RECORD NO.:  [id number]          PATIENT TYPE:  OUT   LOCATION:  SLEEP CENTER                 FACILITY:  [facility]   PHYSICIAN:  Marcelyn Bruins, M.D. [facility] DATE OF BIRTH:  07/26/1965   DATE OF STUDY:  04/05/2004                              NOCTURNAL POLYSOMNOGRAM   REFERRING PHYSICIAN:  Dr. Oliver Barre.   INDICATION FOR THE STUDY:  Hypersomnia with sleep apnea.  Epworth score is  13.   SLEEP ARCHITECTURE:  The patient had a total sleep time of 378 minutes with  decreased REM and slow wave sleep.  The patient had frequent body position  changes during the study and very fragmented sleep.  Sleep efficiency was at  72%.  Sleep onset latency was very prolonged at 67 minutes, as was REM  latency at 235 minutes.   IMPRESSION:  1.  Severe obstructive sleep apnea/hypopnea syndrome, with a respiratory      disturbance index of 36 events per hour and oxygen desaturation as low      as 80%.  The events were not positional.  2.  Loud snoring noted throughout the study.  3.  No clinically significant cardiac arrhythmias.      KC/MEDQ  D:  04/29/2004 11:53:55  T:  04/29/2004 12:34:03  Job:  [id number]

## 2010-11-15 NOTE — Assessment & Plan Note (Signed)
[facility]                                   ON-CALL NOTE   NAME:Gadway, AZANI BROGDON                  MRN:          [id number]  DATE:03/06/2006                            DOB:          1965-09-09    TELEPHONE TRIAGE NOTE:   TIME RECEIVED:  8:05 p.m.   TELEPHONE:  [id number].   The patient was recently seen in the office for followup of hypertension and  diabetes.  Recent hemoglobin was 5.8, and I had intended to decrease her  metformin from 500 mg 1 tablet at night to 1/2 of a tablet b.i.d.  Unfortunately, the patient misunderstood and has actually been taking 1/2 of  her metoprolol 25 mg tablet b.i.d. instead of a whole tablet b.i.d.  Her  sugars have been quite well controlled.  In fact, her random sugar earlier  this evening was 108.  However, she has felt somewhat light-headed and dizzy  over the past few days.  She realized her mistake in the dosage and called  tonight to find out if she needed to do anything.  My response is to go back  to a full 25 mg tablet of metoprolol b.i.d.  She should keep her metformin  where it currently is at 500 mg once at night.  She should follow her sugars  closely over the next month and then see me in the office.                                   Tera Mater. Clent Ridges, MD   SAF/MedQ  DD:  03/06/2006  DT:  03/07/2006  Job #:  [id number]

## 2010-11-22 ENCOUNTER — Inpatient Hospital Stay (HOSPITAL_COMMUNITY): Payer: 59

## 2010-11-22 ENCOUNTER — Inpatient Hospital Stay (HOSPITAL_COMMUNITY)
Admission: RE | Admit: 2010-11-22 | Discharge: 2010-11-26 | DRG: 470 | Disposition: A | Discharge status: Skilled Nursing Facility | Payer: 59 | Source: Ambulatory Visit | Attending: Orthopaedic Surgery | Admitting: Orthopaedic Surgery

## 2010-11-22 DIAGNOSIS — Z01812 Encounter for preprocedural laboratory examination: Secondary | ICD-10-CM

## 2010-11-22 DIAGNOSIS — E119 Type 2 diabetes mellitus without complications: Secondary | ICD-10-CM | POA: Diagnosis present

## 2010-11-22 DIAGNOSIS — D62 Acute posthemorrhagic anemia: Secondary | ICD-10-CM | POA: Diagnosis not present

## 2010-11-22 DIAGNOSIS — M169 Osteoarthritis of hip, unspecified: Principal | ICD-10-CM | POA: Diagnosis present

## 2010-11-22 DIAGNOSIS — M1610 Unilateral primary osteoarthritis, unspecified hip: Principal | ICD-10-CM | POA: Diagnosis present

## 2010-11-22 DIAGNOSIS — E785 Hyperlipidemia, unspecified: Secondary | ICD-10-CM | POA: Diagnosis present

## 2010-11-22 DIAGNOSIS — I10 Essential (primary) hypertension: Secondary | ICD-10-CM | POA: Diagnosis present

## 2010-11-22 DIAGNOSIS — R110 Nausea: Secondary | ICD-10-CM | POA: Diagnosis not present

## 2010-11-22 LAB — GLUCOSE, CAPILLARY
Glucose-Capillary: 139 mg/dL — ABNORMAL HIGH (ref 70–99)
Glucose-Capillary: 145 mg/dL — ABNORMAL HIGH (ref 70–99)
Glucose-Capillary: 117 mg/dL — ABNORMAL HIGH (ref 70–99)
Glucose-Capillary: 141 mg/dL — ABNORMAL HIGH (ref 70–99)
Glucose-Capillary: 144 mg/dL — ABNORMAL HIGH (ref 70–99)

## 2010-11-22 LAB — TYPE AND SCREEN
ABO/RH(D): A NEG
Antibody Screen: NEGATIVE

## 2010-11-22 LAB — ABO/RH: ABO/RH(D): A NEG

## 2010-11-23 LAB — CBC
HCT: 33.3 % — ABNORMAL LOW (ref 36.0–46.0)
Hemoglobin: 10.8 g/dL — ABNORMAL LOW (ref 12.0–15.0)
MCH: 29.6 pg (ref 26.0–34.0)
MCHC: 32.4 g/dL (ref 30.0–36.0)
MCV: 91.2 fL (ref 78.0–100.0)
Platelets: 295 10*3/uL (ref 150–400)
RBC: 3.65 MIL/uL — ABNORMAL LOW (ref 3.87–5.11)
RDW: 14.7 % (ref 11.5–15.5)
WBC: 9.7 10*3/uL (ref 4.0–10.5)

## 2010-11-23 LAB — BASIC METABOLIC PANEL
BUN: 11 mg/dL (ref 6–23)
CO2: 28 mEq/L (ref 19–32)
Calcium: 8 mg/dL — ABNORMAL LOW (ref 8.4–10.5)
Chloride: 99 mEq/L (ref 96–112)
Creatinine, Ser: 0.69 mg/dL (ref 0.4–1.2)
GFR calc Af Amer: >60 mL/min (ref >60)
GFR calc non Af Amer: >60 mL/min (ref >60)
Glucose, Bld: 142 mg/dL — ABNORMAL HIGH (ref 70–99)
Potassium: 3.8 mEq/L (ref 3.5–5.1)
Sodium: 136 mEq/L (ref 135–145)

## 2010-11-23 LAB — GLUCOSE, CAPILLARY
Glucose-Capillary: 150 mg/dL — ABNORMAL HIGH (ref 70–99)
Glucose-Capillary: 153 mg/dL — ABNORMAL HIGH (ref 70–99)
Glucose-Capillary: 160 mg/dL — ABNORMAL HIGH (ref 70–99)

## 2010-11-23 NOTE — Op Note (Signed)
NAMEYASLENE, Morgan [name]           ACCOUNT NO.:  [id number]  MEDICAL RECORD NO.:  [id number]           PATIENT TYPE:  I  LOCATION:  1609                         FACILITY:  [facility]  PHYSICIAN:  Vanita Panda. Magnus Ivan, M.D.DATE OF BIRTH:  Jul 24, 1965  DATE OF PROCEDURE:  11/22/2010 DATE OF DISCHARGE:                              OPERATIVE REPORT   POSTPROCEDURE DIAGNOSES:  Severe osteoarthritis and degenerative joint disease, right hip.  POSTOPERATIVE DIAGNOSES:  Severe osteoarthritis and degenerative joint disease, right hip.  PROCEDURE:  Right total hip arthroplasty through direct anterior approach.  IMPLANTS:  DePuy Pinnacle Sector acetabular component with Gription size 52, Corail femoral component with HA coating and stem size 8 with standard offset, size 36 +1.5 ceramic femoral head, neutral +4 polyethylene liner.  SURGEON:  Vanita Panda. Magnus Ivan, MD  ANESTHESIA:  General.  ANTIBIOTICS:  1 g IV Ancef.  BLOOD LOSS:  350 cc.  COMPLICATIONS:  None.  INDICATIONS:  Morgan [name] is a 45 year old female with debilitating arthritis involving right hip.  It is greatly affecting activities of daily living to the point where she wishes to proceed with total hip arthroplasty.  I explained in detail the risks and benefits this including risk of acute blood loss anemia, DVT, and PE, and she does wish to proceed with surgery.  PROCEDURE OF DESCRIPTION:  After informed consent was obtained. Appropriate right hip was marked.  She was brought to the operating room and general anesthesia was obtained while she is on the stretcher.  A Foley catheter was placed  as well as traction boots on her feet for placement on the Hana table.  She was placed on the table and perineal post was placed and both legs were in traction, but no traction applied. Her right hip was prepped and draped with DuraPrep and sterile drapes. A time-out was called to identify the correct patient and correct  right hip.  I then made an incision 1 cm distal and 3 cm posterior to the anterior superior iliac spine and carried this incision obliquely down to the tensor fasciae lata.  The tensor fasciae lata was then divided longitudinally and I proceeded with a direct anterior approach to the hip.  A Cobra retractor was placed around the anterolateral and medial neck.  We were able to open up joint capsule and placed this within the joint capsule.  I coagulated outer femoral circumflex vessels as well. Under direct fluoroscopic guidance, I assessed my neck cut  under direct visualization, then I made my femoral neck cut with oscillating saw and finished this with osteotome.  Using corkscrew guide, I was able to remove the femoral head in its entirety.  I then cleaned acetabulum of debris using electrocautery, knife, and pituitary.  I then began reaming from size 44 up to size 51 with  last few reamers placed under direct fluoroscopic guidance.  I then placed real size 52 acetabular component and tapped this in the place and I was please with the inclination and anteversion.  Attention was then turned to the femur.  All traction was off the right leg and a hook was placed within the incision but underneath  the femur for support.  We then externally rotated the leg to 90 degrees, extended and adducted to allow exposure to the femoral canal.  I try to replace behind the greater trochanter and now released the lateral capsule, I used the cookie cutter guide, then began broaching first with the small trial like broach, then with the size 8 Corail broach.  The size 8 which was the first broach which actually very stable and placed a standard neck with 36 + 1.5 head and reduce this into the acetabulum and her leg lengths looked normal under direct fluoroscopy and she had no shuck and was able to externally rotated to 90 degrees and internal rotated to 45 degrees and she was stable appearing.  I then  removed all trial instrumentation and was able to then place the real size 8 stem followed by the real 36+1.5 hip ball, reduced this back to the acetabulum and again it was felt to be stable, under direct fluoroscopy look good. I then removed all instrumentation and closed the joint capsule with an interrupted #1 Ethibond followed by #1 Vicryl in the tensor fascia lata , 2-0 in the subcutaneous tissue and 4-0 Vicryl subcuticular stitch.  I also placed Dermabond, well-padded sterile dressings applied.  She is awake, extubated and taken to the recovery room in stable condition.  All final counts were correct and there were no complications noted.     Vanita Panda. Magnus Ivan, M.D.     CYB/MEDQ  D:  11/22/2010  T:  11/22/2010  Job:  [id number]  Electronically Signed by Doneen Poisson M.D. on 11/23/2010 11:11:33 AM

## 2010-11-23 NOTE — H&P (Signed)
  NAMEJESSICAANN, Morgan [name]           ACCOUNT NO.:  [id number]  MEDICAL RECORD NO.:  [id number]           PATIENT TYPE:  I  LOCATION:  0001                         [facility]  PHYSICIAN:  Vanita Panda. Magnus Ivan, M.D.DATE OF BIRTH:  1965/07/08  DATE OF ADMISSION:  11/22/2010 DATE OF DISCHARGE:                             HISTORY & PHYSICAL   CHIEF COMPLAINT:  Right hip pain.  HISTORY OF PRESENT ILLNESS:  Morgan [name] is a 45 year old female with right hip pain.  She is scheduled to undergo a right total hip arthroplasty today secondary to severe osteoarthritis of the right hip as it hurts her on a daily basis and is interfering with activities of daily living, has gotten to the point where she wishes to proceed with total hip arthroplasty.  I have explained to her in detail about anterior approach and she does wish to proceed with the surgery.  I have explained to her the risks and benefits as well including acute blood loss anemia, DVT, and PE.  PAST MEDICAL HISTORY: 1. Type 2 diabetes. 2. High blood pressure. 3. Acid reflux. 4. Arthritis.  MEDICATIONS: 1. Metformin. 2. Metoprolol. 3. Simvastatin.  ALLERGIES:  No known drug allergies.  PAST SURGICAL HISTORY: 1. Ovarian cancer, 1986. 2. Melanoma 1997 removal.  FAMILY MEDICAL HISTORY:  Diabetes, ovarian cancer, high blood pressure.  SOCIAL HISTORY:  She is single.  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  She does not smoke.  She does not drink.  REVIEW OF SYSTEMS:  Negative for chest pain, shortness of breath, fever, chills, nausea, vomiting.  PHYSICAL EXAMINATION:  VITAL SIGNS:  She is afebrile with stable vital signs. GENERAL:  She is oriented x3 in no acute distress. HEENT:  Normocephalic, atraumatic.  Pupils equal and reactive to light. Extraocular muscles intact. NECK:  Supple. LUNGS:  Clear to auscultation bilaterally. HEART:  Regular rate and rhythm. ABDOMEN:  Obese. EXTREMITIES:  Right hip shows severe  pain with internal and external rotation.  DIAGNOSTIC STUDIES:  X-rays of her hip are reviewed and show severe end- stage arthritis of the right hip.  IMPRESSION:  This is a 45 year old female with debilitating arthritis of her right hip.  PLAN:  We will proceed with the right total hip arthroplasty today and then she will be admitted as an inpatient.     Vanita Panda. Magnus Ivan, M.D.     CYB/MEDQ  D:  11/22/2010  T:  11/22/2010  Job:  [id number]  Electronically Signed by Doneen Poisson M.D. on 11/23/2010 11:11:31 AM

## 2010-11-24 LAB — CBC
HCT: 27.2 % — ABNORMAL LOW (ref 36.0–46.0)
Hemoglobin: 9 g/dL — ABNORMAL LOW (ref 12.0–15.0)
MCH: 29.6 pg (ref 26.0–34.0)
MCHC: 33.1 g/dL (ref 30.0–36.0)
MCV: 89.5 fL (ref 78.0–100.0)
Platelets: 277 10*3/uL (ref 150–400)
RBC: 3.04 MIL/uL — ABNORMAL LOW (ref 3.87–5.11)
RDW: 14.5 % (ref 11.5–15.5)
WBC: 9.6 10*3/uL (ref 4.0–10.5)

## 2010-11-24 LAB — GLUCOSE, CAPILLARY
Glucose-Capillary: 150 mg/dL — ABNORMAL HIGH (ref 70–99)
Glucose-Capillary: 141 mg/dL — ABNORMAL HIGH (ref 70–99)
Glucose-Capillary: 158 mg/dL — ABNORMAL HIGH (ref 70–99)
Glucose-Capillary: 134 mg/dL — ABNORMAL HIGH (ref 70–99)
Glucose-Capillary: 161 mg/dL — ABNORMAL HIGH (ref 70–99)

## 2010-11-24 LAB — BASIC METABOLIC PANEL
BUN: 6 mg/dL (ref 6–23)
CO2: 29 mEq/L (ref 19–32)
Calcium: 8.1 mg/dL — ABNORMAL LOW (ref 8.4–10.5)
Chloride: 98 mEq/L (ref 96–112)
Creatinine, Ser: 0.52 mg/dL (ref 0.4–1.2)
GFR calc Af Amer: >60 mL/min (ref >60)
GFR calc non Af Amer: >60 mL/min (ref >60)
Glucose, Bld: 136 mg/dL — ABNORMAL HIGH (ref 70–99)
Potassium: 3.4 mEq/L — ABNORMAL LOW (ref 3.5–5.1)
Sodium: 135 mEq/L (ref 135–145)

## 2010-11-25 LAB — CBC
HCT: 26.3 % — ABNORMAL LOW (ref 36.0–46.0)
Hemoglobin: 8.6 g/dL — ABNORMAL LOW (ref 12.0–15.0)
MCH: 29.5 pg (ref 26.0–34.0)
MCHC: 32.7 g/dL (ref 30.0–36.0)
MCV: 90.1 fL (ref 78.0–100.0)
Platelets: 299 10*3/uL (ref 150–400)
RBC: 2.92 MIL/uL — ABNORMAL LOW (ref 3.87–5.11)
RDW: 14.4 % (ref 11.5–15.5)
WBC: 10 10*3/uL (ref 4.0–10.5)

## 2010-11-25 LAB — GLUCOSE, CAPILLARY
Glucose-Capillary: 160 mg/dL — ABNORMAL HIGH (ref 70–99)
Glucose-Capillary: 126 mg/dL — ABNORMAL HIGH (ref 70–99)
Glucose-Capillary: 123 mg/dL — ABNORMAL HIGH (ref 70–99)
Glucose-Capillary: 130 mg/dL — ABNORMAL HIGH (ref 70–99)
Glucose-Capillary: 163 mg/dL — ABNORMAL HIGH (ref 70–99)

## 2010-11-26 LAB — GLUCOSE, CAPILLARY
Glucose-Capillary: 120 mg/dL — ABNORMAL HIGH (ref 70–99)
Glucose-Capillary: 114 mg/dL — ABNORMAL HIGH (ref 70–99)

## 2010-11-30 NOTE — Discharge Summary (Signed)
  NAMEKENI, WAFER           ACCOUNT NO.:  [id number]  MEDICAL RECORD NO.:  [id number]           PATIENT TYPE:  I  LOCATION:  1609                         FACILITY:  [facility]  PHYSICIAN:  Vanita Panda. Magnus Ivan, M.D.DATE OF BIRTH:  1965/10/14  DATE OF ADMISSION:  11/22/2010 DATE OF DISCHARGE:  11/26/2010                              DISCHARGE SUMMARY   ADMISSION DIAGNOSIS:  Severe degenerative joint disease of the right hip.  SECONDARY DIAGNOSES: 1. Hypertension. 2. High lipids.  DISCHARGE DIAGNOSIS:  Status post right total-hip arthroplasty.  PROCEDURE:  Right total-hip arthroplasty through direct anterior approach on Nov 22, 2010.  HOSPITAL COURSE:  Morgan [name] is a very pleasant 45 year old who lives alone.  She has debilitating arthritis involving her right hip.  She was taken to the operating room on the day of admission for an elective total-hip arthroplasty through a direct anterior approach.  She did well.  Her postoperative was complicated only by nausea.  She does live alone and it was felt that she needed convalescence in a skilled nursing facility for short-term rehabilitation following surgery due also to the fact that this is on her right hip.  By the day of discharge she was feeling better and it was felt that she could be discharged safely to the skilled nursing facility.  DISPOSITION:  Discharged to short-term skilled nursing facility.  DISCHARGE MEDICATIONS: 1. Percocet 5/325 one to two p.o. q.4-6 hours p.r.n. pain. 2. Robaxin 500 mg p.o. q.6 hours p.r.n. spasms. 3. Lisinopril/hydrochlorothiazide 20/25 mg one tablet p.o. q.a.m. 4. Lipitor 40 mg p.o. daily. 5. Metoprolol 25 mg p.o. b.i.d. 6. Metformin XR 500 mg p.o. daily. 7. Iron sulfate 325 mg p.o. t.i.d. with meals for one week.  DISCHARGE INSTRUCTIONS:  While she is in the skilled nursing facility, she can get up with assistance for therapy.  There will be no hip precautions, but they will work  on balance, gait training and coordination.  A dry dressing should be applied daily to her right hip incision and she can start getting the incision wet on Nov 28, 2010.  FOLLOWUP:  A followup appointment should be established with [facility] in two weeks after discharge.     Vanita Panda. Magnus Ivan, M.D.     CYB/MEDQ  D:  11/25/2010  T:  11/25/2010  Job:  [id number]  Electronically Signed by Doneen Poisson M.D. on 11/30/2010 09:41:39 AM

## 2010-12-16 ENCOUNTER — Ambulatory Visit (INDEPENDENT_AMBULATORY_CARE_PROVIDER_SITE_OTHER): Payer: 59 | Admitting: Family Medicine

## 2010-12-16 ENCOUNTER — Encounter: Payer: Self-pay | Admitting: Family Medicine

## 2010-12-16 VITALS — BP 96/60 | HR 84 | Temp 98.9°F | Ht 62.0 in | Wt 226.0 lb

## 2010-12-16 DIAGNOSIS — I10 Essential (primary) hypertension: Secondary | ICD-10-CM

## 2010-12-16 DIAGNOSIS — E119 Type 2 diabetes mellitus without complications: Secondary | ICD-10-CM

## 2010-12-16 DIAGNOSIS — M25559 Pain in unspecified hip: Secondary | ICD-10-CM

## 2010-12-16 MED ORDER — ATORVASTATIN CALCIUM 40 MG PO TABS: 40.0000 mg | ORAL_TABLET | Freq: Every day | ORAL | Status: DC

## 2010-12-16 MED ORDER — LISINOPRIL-HYDROCHLOROTHIAZIDE 20-25 MG PO TABS: 1.0000 | ORAL_TABLET | Freq: Every day | ORAL | Status: DC

## 2010-12-16 MED ORDER — METOPROLOL TARTRATE 25 MG PO TABS: 25.0000 mg | ORAL_TABLET | Freq: Two times a day (BID) | ORAL | Status: DC

## 2010-12-16 MED ORDER — METFORMIN HCL 500 MG PO TABS: 500.0000 mg | ORAL_TABLET | Freq: Two times a day (BID) | ORAL | Status: DC

## 2010-12-16 NOTE — Progress Notes (Signed)
  Subjective:    Patient ID: Morgan [name], female    DOB: March 21, 1966, 45 y.o.   MRN: [id number]  HPI Here to follow up after a right total hip replacement per Dr. Rayburn Ma on 11-22-10. She has done very well, and she continues to do her PT daily. She is using a walker for now, but her hip pain has totally resolved. She is on Xarelto for the time being, and she will see him again on 01-02-11. Her glucoses and BP have been stable.    Review of Systems  Constitutional: Negative.   Respiratory: Negative.   Cardiovascular: Negative.   Musculoskeletal: Negative.        Objective:   Physical Exam  Constitutional:       Walking easily with her walker   Cardiovascular: Normal rate, regular rhythm, normal heart sounds and intact distal pulses.   Pulmonary/Chest: Effort normal and breath sounds normal.          Assessment & Plan:  Doing well. Meds are refilled

## 2010-12-23 ENCOUNTER — Ambulatory Visit: Payer: 59 | Attending: Orthopaedic Surgery

## 2010-12-23 DIAGNOSIS — M25559 Pain in unspecified hip: Secondary | ICD-10-CM | POA: Insufficient documentation

## 2010-12-23 DIAGNOSIS — M6281 Muscle weakness (generalized): Secondary | ICD-10-CM | POA: Insufficient documentation

## 2010-12-23 DIAGNOSIS — Z96649 Presence of unspecified artificial hip joint: Secondary | ICD-10-CM | POA: Insufficient documentation

## 2010-12-23 DIAGNOSIS — R5381 Other malaise: Secondary | ICD-10-CM | POA: Insufficient documentation

## 2010-12-23 DIAGNOSIS — IMO0001 Reserved for inherently not codable concepts without codable children: Secondary | ICD-10-CM | POA: Insufficient documentation

## 2010-12-23 DIAGNOSIS — R269 Unspecified abnormalities of gait and mobility: Secondary | ICD-10-CM | POA: Insufficient documentation

## 2010-12-25 ENCOUNTER — Ambulatory Visit: Payer: 59 | Admitting: Physical Therapy

## 2010-12-30 ENCOUNTER — Ambulatory Visit: Payer: 59 | Attending: Orthopaedic Surgery | Admitting: Physical Therapy

## 2010-12-30 DIAGNOSIS — Z96649 Presence of unspecified artificial hip joint: Secondary | ICD-10-CM | POA: Insufficient documentation

## 2010-12-30 DIAGNOSIS — R5381 Other malaise: Secondary | ICD-10-CM | POA: Insufficient documentation

## 2010-12-30 DIAGNOSIS — M6281 Muscle weakness (generalized): Secondary | ICD-10-CM | POA: Insufficient documentation

## 2010-12-30 DIAGNOSIS — M25559 Pain in unspecified hip: Secondary | ICD-10-CM | POA: Insufficient documentation

## 2010-12-30 DIAGNOSIS — IMO0001 Reserved for inherently not codable concepts without codable children: Secondary | ICD-10-CM | POA: Insufficient documentation

## 2010-12-30 DIAGNOSIS — R269 Unspecified abnormalities of gait and mobility: Secondary | ICD-10-CM | POA: Insufficient documentation

## 2011-01-03 ENCOUNTER — Ambulatory Visit: Payer: 59 | Admitting: Physical Therapy

## 2011-01-07 ENCOUNTER — Ambulatory Visit: Payer: 59

## 2011-01-08 ENCOUNTER — Ambulatory Visit: Payer: 59 | Admitting: Physical Therapy

## 2011-01-14 ENCOUNTER — Ambulatory Visit: Payer: 59

## 2011-01-20 ENCOUNTER — Encounter (INDEPENDENT_AMBULATORY_CARE_PROVIDER_SITE_OTHER): Payer: Self-pay | Admitting: Surgery

## 2011-01-20 ENCOUNTER — Ambulatory Visit (INDEPENDENT_AMBULATORY_CARE_PROVIDER_SITE_OTHER): Payer: 59 | Admitting: Surgery

## 2011-01-20 VITALS — BP 128/90 | HR 108 | Temp 96.9°F | Ht 66.0 in | Wt 229.4 lb

## 2011-01-20 DIAGNOSIS — K602 Anal fissure, unspecified: Secondary | ICD-10-CM | POA: Insufficient documentation

## 2011-01-20 NOTE — Progress Notes (Signed)
Subjective:     Patient ID: Morgan [name], female   DOB: 05/01/1966, 45 y.o.   MRN: [id number]  HPI  This is a 45 year old female who we have seen in the past with anal fissures and hemorrhoidal problems. She had a hip replacement several months ago. She has become constipated and now has perianal discomfort. She is on ProctoFoam cream without improvement. She is also taking a stool softener. Review of Systems     Objective:   Physical Exam Examination was limited to the perirectal area. Her perianal skin is normal. I attempted a digital exam it was too painful and she had a tight sphincter and a posterior anal fissure.    Assessment:     Anal fissure    Plan:     I am placing her on diltiazem cream. She will do sitz baths and continue distal softener and ProctoFoam. I will see her back in 2-3 weeks.

## 2011-01-21 ENCOUNTER — Ambulatory Visit: Payer: 59

## 2011-01-27 ENCOUNTER — Encounter: Payer: 59 | Admitting: Physical Therapy

## 2011-01-29 ENCOUNTER — Encounter: Payer: 59 | Admitting: Physical Therapy

## 2011-02-03 ENCOUNTER — Ambulatory Visit (INDEPENDENT_AMBULATORY_CARE_PROVIDER_SITE_OTHER): Payer: 59 | Admitting: Surgery

## 2011-02-03 DIAGNOSIS — K602 Anal fissure, unspecified: Secondary | ICD-10-CM

## 2011-02-03 NOTE — Progress Notes (Signed)
Subjective:     Patient ID: Morgan [name], female   DOB: Dec 06, 1965, 45 y.o.   MRN: [id number]  HPI  She is here for a followup visit. She had relief with the diltiazem cream. She still has constipation with MiraLax. Review of Systems     Objective:   Physical Exam    On exam, her perianal skin is normal. I did not try to visualize the fissure for discomfort reasonsAssessment:      Impression: Healed anal fissure    Plan:     I will see her back as needed

## 2011-04-07 ENCOUNTER — Encounter (HOSPITAL_BASED_OUTPATIENT_CLINIC_OR_DEPARTMENT_OTHER): Payer: Self-pay

## 2011-04-07 ENCOUNTER — Emergency Department (INDEPENDENT_AMBULATORY_CARE_PROVIDER_SITE_OTHER): Payer: 59

## 2011-04-07 ENCOUNTER — Emergency Department (HOSPITAL_BASED_OUTPATIENT_CLINIC_OR_DEPARTMENT_OTHER)
Admission: EM | Admit: 2011-04-07 | Discharge: 2011-04-08 | Disposition: A | Discharge status: Home / Self Care | Payer: 59 | Attending: Emergency Medicine | Admitting: Emergency Medicine

## 2011-04-07 DIAGNOSIS — Z8543 Personal history of malignant neoplasm of ovary: Secondary | ICD-10-CM

## 2011-04-07 DIAGNOSIS — E119 Type 2 diabetes mellitus without complications: Secondary | ICD-10-CM | POA: Insufficient documentation

## 2011-04-07 DIAGNOSIS — R112 Nausea with vomiting, unspecified: Secondary | ICD-10-CM | POA: Insufficient documentation

## 2011-04-07 DIAGNOSIS — R109 Unspecified abdominal pain: Secondary | ICD-10-CM

## 2011-04-07 DIAGNOSIS — E785 Hyperlipidemia, unspecified: Secondary | ICD-10-CM | POA: Insufficient documentation

## 2011-04-07 DIAGNOSIS — K219 Gastro-esophageal reflux disease without esophagitis: Secondary | ICD-10-CM | POA: Insufficient documentation

## 2011-04-07 DIAGNOSIS — Z8739 Personal history of other diseases of the musculoskeletal system and connective tissue: Secondary | ICD-10-CM | POA: Insufficient documentation

## 2011-04-07 DIAGNOSIS — I10 Essential (primary) hypertension: Secondary | ICD-10-CM | POA: Insufficient documentation

## 2011-04-07 LAB — URINE MICROSCOPIC-ADD ON

## 2011-04-07 LAB — COMPREHENSIVE METABOLIC PANEL
ALT: 34 U/L (ref 0–35)
AST: 22 U/L (ref 0–37)
Albumin: 3.8 g/dL (ref 3.5–5.2)
Alkaline Phosphatase: 97 U/L (ref 39–117)
BUN: 20 mg/dL (ref 6–23)
CO2: 25 mEq/L (ref 19–32)
Calcium: 10.4 mg/dL (ref 8.4–10.5)
Chloride: 97 mEq/L (ref 96–112)
Creatinine, Ser: 0.8 mg/dL (ref 0.50–1.10)
GFR calc Af Amer: >90 mL/min (ref >90)
GFR calc non Af Amer: 86 mL/min — ABNORMAL LOW (ref >90)
Glucose, Bld: 149 mg/dL — ABNORMAL HIGH (ref 70–99)
Potassium: 3.6 mEq/L (ref 3.5–5.1)
Sodium: 138 mEq/L (ref 135–145)
Total Bilirubin: 0.6 mg/dL (ref 0.3–1.2)
Total Protein: 7.7 g/dL (ref 6.0–8.3)

## 2011-04-07 LAB — DIFFERENTIAL
Basophils Absolute: 0 10*3/uL (ref 0.0–0.1)
Basophils Relative: 0 % (ref 0–1)
Eosinophils Absolute: 0 10*3/uL (ref 0.0–0.7)
Eosinophils Relative: 0 % (ref 0–5)
Lymphocytes Relative: 15 % (ref 12–46)
Lymphs Abs: 2.3 10*3/uL (ref 0.7–4.0)
Monocytes Absolute: 0.6 10*3/uL (ref 0.1–1.0)
Monocytes Relative: 4 % (ref 3–12)
Neutro Abs: 12.3 10*3/uL — ABNORMAL HIGH (ref 1.7–7.7)
Neutrophils Relative %: 80 % — ABNORMAL HIGH (ref 43–77)

## 2011-04-07 LAB — CBC
HCT: 38.3 % (ref 36.0–46.0)
Hemoglobin: 12.9 g/dL (ref 12.0–15.0)
MCH: 28.2 pg (ref 26.0–34.0)
MCHC: 33.7 g/dL (ref 30.0–36.0)
MCV: 83.8 fL (ref 78.0–100.0)
Platelets: 378 10*3/uL (ref 150–400)
RBC: 4.57 MIL/uL (ref 3.87–5.11)
RDW: 14.9 % (ref 11.5–15.5)
WBC: 15.3 10*3/uL — ABNORMAL HIGH (ref 4.0–10.5)

## 2011-04-07 LAB — URINALYSIS, ROUTINE W REFLEX MICROSCOPIC
Bilirubin Urine: NEGATIVE
Glucose, UA: NEGATIVE mg/dL
Hgb urine dipstick: NEGATIVE
Ketones, ur: 40 mg/dL — AB
Nitrite: NEGATIVE
Protein, ur: NEGATIVE mg/dL
Specific Gravity, Urine: 1.026 (ref 1.005–1.030)
Urobilinogen, UA: 0.2 mg/dL (ref 0.0–1.0)
pH: 7 (ref 5.0–8.0)

## 2011-04-07 LAB — PREGNANCY, URINE: Preg Test, Ur: NEGATIVE

## 2011-04-07 LAB — LIPASE, BLOOD: Lipase: 40 U/L (ref 11–59)

## 2011-04-07 MED ORDER — LORAZEPAM 2 MG/ML IJ SOLN
INTRAMUSCULAR | Status: AC
  Filled 2011-04-07: qty 1

## 2011-04-07 MED ORDER — LORAZEPAM 2 MG/ML IJ SOLN
1.0000 mg | Freq: Once | INTRAMUSCULAR | Status: AC
  Administered 2011-04-07: 1 mg via INTRAVENOUS

## 2011-04-07 MED ORDER — HYDROMORPHONE HCL 1 MG/ML IJ SOLN
INTRAMUSCULAR | Status: AC
  Filled 2011-04-07: qty 1

## 2011-04-07 MED ORDER — ONDANSETRON HCL 4 MG/2ML IJ SOLN
INTRAMUSCULAR | Status: AC
  Filled 2011-04-07: qty 2

## 2011-04-07 MED ORDER — HYDROMORPHONE HCL 1 MG/ML IJ SOLN
1.0000 mg | Freq: Once | INTRAMUSCULAR | Status: AC
  Administered 2011-04-07: 1 mg via INTRAVENOUS

## 2011-04-07 MED ORDER — ONDANSETRON HCL 4 MG/2ML IJ SOLN
4.0000 mg | Freq: Once | INTRAMUSCULAR | Status: AC
  Administered 2011-04-07: 4 mg via INTRAVENOUS

## 2011-04-07 NOTE — ED Notes (Signed)
Abd pain started approx 3-4 pm-v/d-sent from [facility] via [facility]

## 2011-04-07 NOTE — ED Provider Notes (Signed)
History    Scribed for Lyanne Co, MD, the patient was seen in room MH10/MH10. This chart was scribed by Katha Cabal. This patient's care was started at 10:37PM.    CSN: [id number] Arrival date & time: 04/07/2011  9:45 PM  Chief Complaint  Patient presents with  . Abdominal Pain    (Consider location/radiation/quality/duration/timing/severity/associated sxs/prior treatment) HPI Per EMS:  Patient ate began vomiting around 3 PM.  Abdomen soft and not tender with palpation.   Persistent dry heaves.  Were not able to give IV fluids.  Morgan [name] is a 45 y.o. female brought in by ambulance, who presents to the Emergency Department complaining of gradual onset of abdominal pain after eating at 12:30 PM.  Reports sudden onset of nausea and vomiting around 3:30 PM.  Denies back pain, dysuria, similar sx previously.  Reports abdominal distention.  Patient took Gas X without relief.  States a couple days ago she had left sided back pain that resolved.  Patient has DM and last blood sugar at home was 127.       PAST MEDICAL HISTORY:  Past Medical History  Diagnosis Date  . Diabetes mellitus   . Hyperlipidemia   . Hypertension   . Ovarian cancer   . Arthritis   . GERD (gastroesophageal reflux disease)     PAST SURGICAL HISTORY:  Past Surgical History  Procedure Date  . Melanoma excision 1997  . Abdominal hysterectomy 1986    BSO  . Hemorrhoid surgery   . Hip fracture surgery     FAMILY HISTORY:  Family History  Problem Relation Age of Onset  . Arthritis    . Diabetes    . Colon cancer    . Prostate cancer    . Breast cancer    . Coronary artery disease       SOCIAL HISTORY: History   Social History  . Marital Status: Single    Spouse Name: N/A    Number of Children: N/A  . Years of Education: N/A   Social History Main Topics  . Smoking status: Never Smoker   . Smokeless tobacco: None  . Alcohol Use: No  . Drug Use: No  . Sexually Active:    Other  Topics Concern  . None   Social History Narrative  . None    Review of Systems  All other systems reviewed and are negative.    Allergies  Review of patient's allergies indicates no known allergies.  Home Medications   Current Outpatient Rx  Name Route Sig Dispense Refill  . ATORVASTATIN CALCIUM 40 MG PO TABS Oral Take 1 tablet (40 mg total) by mouth daily. 30 tablet 11  . CALCIUM CARBONATE ANTACID 500 MG PO CHEW Oral Chew 2 tablets by mouth daily as needed. For indigestion      . LISINOPRIL-HYDROCHLOROTHIAZIDE 20-25 MG PO TABS Oral Take 1 tablet by mouth daily. 30 tablet 11  . METFORMIN HCL 500 MG (MOD) PO TB24 Oral Take 500 mg by mouth every evening.      Marland Kitchen METOPROLOL TARTRATE 25 MG PO TABS Oral Take 1 tablet (25 mg total) by mouth 2 (two) times daily. 60 tablet 11  . SIMETHICONE 80 MG PO CHEW Oral Chew 160 mg by mouth every 6 (six) hours as needed. For indigestion     . METFORMIN HCL 500 MG PO TABS Oral Take 1 tablet (500 mg total) by mouth 2 (two) times daily with a meal. 60 tablet 11  . Carlena Hurl  10 MG PO TABS Oral Take 1 tablet by mouth Daily.      BP 110/72  Pulse 88  Temp(Src) 97.7 F (36.5 C) (Oral)  Resp 20  Ht 5\' 6"  (1.676 m)  Wt 240 lb (108.863 kg)  BMI 38.74 kg/m2  SpO2 100%  Physical Exam  Nursing note and vitals reviewed. Constitutional: She is oriented to person, place, and time. She appears well-developed and well-nourished. No distress.  HENT:  Head: Normocephalic and atraumatic.  Eyes: EOM are normal.  Neck: Normal range of motion.  Cardiovascular: Normal rate, regular rhythm and normal heart sounds.   Pulmonary/Chest: Effort normal and breath sounds normal. No respiratory distress. She has no wheezes. She has no rales.  Abdominal: Soft. There is no tenderness. There is no rebound, no guarding and no CVA tenderness.  Musculoskeletal: Normal range of motion.  Neurological: She is alert and oriented to person, place, and time.  Skin: Skin is warm and  dry.  Psychiatric: She has a normal mood and affect. Judgment normal.    ED Course  Procedures (including critical care time)   OTHER DATA REVIEWED: Nursing notes, vital signs, and past medical records reviewed.  DIAGNOSTIC STUDIES: Oxygen Saturation is 100% on room air, normal by my interpretation.     LABS / RADIOLOGY:  Labs Reviewed  CBC - Abnormal; Notable for the following:    WBC 15.3 (*)    All other components within normal limits  DIFFERENTIAL - Abnormal; Notable for the following:    Neutrophils Relative 80 (*)    Neutro Abs 12.3 (*)    All other components within normal limits  COMPREHENSIVE METABOLIC PANEL - Abnormal; Notable for the following:    Glucose, Bld 149 (*)    GFR calc non Af Amer 86 (*)    All other components within normal limits  URINALYSIS, ROUTINE W REFLEX MICROSCOPIC - Abnormal; Notable for the following:    Appearance CLOUDY (*)    Ketones, ur 40 (*)    Leukocytes, UA TRACE (*)    All other components within normal limits  URINE MICROSCOPIC-ADD ON - Abnormal; Notable for the following:    Squamous Epithelial / LPF MANY (*)    Bacteria, UA MANY (*)    Casts HYALINE CASTS (*) GRANULAR CAST   All other components within normal limits  LIPASE, BLOOD  PREGNANCY, URINE    Results for orders placed during the hospital encounter of 04/07/11  CBC      Component Value Range   WBC 15.3 (*) 4.0 - 10.5 (K/uL)   RBC 4.57  3.87 - 5.11 (MIL/uL)   Hemoglobin 12.9  12.0 - 15.0 (g/dL)   HCT 91.4  78.2 - 95.6 (%)   MCV 83.8  78.0 - 100.0 (fL)   MCH 28.2  26.0 - 34.0 (pg)   MCHC 33.7  30.0 - 36.0 (g/dL)   RDW 21.3  08.6 - 57.8 (%)   Platelets 378  150 - 400 (K/uL)  DIFFERENTIAL      Component Value Range   Neutrophils Relative 80 (*) 43 - 77 (%)   Neutro Abs 12.3 (*) 1.7 - 7.7 (K/uL)   Lymphocytes Relative 15  12 - 46 (%)   Lymphs Abs 2.3  0.7 - 4.0 (K/uL)   Monocytes Relative 4  3 - 12 (%)   Monocytes Absolute 0.6  0.1 - 1.0 (K/uL)   Eosinophils  Relative 0  0 - 5 (%)   Eosinophils Absolute 0.0  0.0 - 0.7 (  K/uL)   Basophils Relative 0  0 - 1 (%)   Basophils Absolute 0.0  0.0 - 0.1 (K/uL)  COMPREHENSIVE METABOLIC PANEL      Component Value Range   Sodium 138  135 - 145 (mEq/L)   Potassium 3.6  3.5 - 5.1 (mEq/L)   Chloride 97  96 - 112 (mEq/L)   CO2 25  19 - 32 (mEq/L)   Glucose, Bld 149 (*) 70 - 99 (mg/dL)   BUN 20  6 - 23 (mg/dL)   Creatinine, Ser 4.54  0.50 - 1.10 (mg/dL)   Calcium 09.8  8.4 - 10.5 (mg/dL)   Total Protein 7.7  6.0 - 8.3 (g/dL)   Albumin 3.8  3.5 - 5.2 (g/dL)   AST 22  0 - 37 (U/L)   ALT 34  0 - 35 (U/L)   Alkaline Phosphatase 97  39 - 117 (U/L)   Total Bilirubin 0.6  0.3 - 1.2 (mg/dL)   GFR calc non Af Amer 86 (*) >90 (mL/min)   GFR calc Af Amer >90  >90 (mL/min)  LIPASE, BLOOD      Component Value Range   Lipase 40  11 - 59 (U/L)  URINALYSIS, ROUTINE W REFLEX MICROSCOPIC      Component Value Range   Color, Urine YELLOW  YELLOW    Appearance CLOUDY (*) CLEAR    Specific Gravity, Urine 1.026  1.005 - 1.030    pH 7.0  5.0 - 8.0    Glucose, UA NEGATIVE  NEGATIVE (mg/dL)   Hgb urine dipstick NEGATIVE  NEGATIVE    Bilirubin Urine NEGATIVE  NEGATIVE    Ketones, ur 40 (*) NEGATIVE (mg/dL)   Protein, ur NEGATIVE  NEGATIVE (mg/dL)   Urobilinogen, UA 0.2  0.0 - 1.0 (mg/dL)   Nitrite NEGATIVE  NEGATIVE    Leukocytes, UA TRACE (*) NEGATIVE   PREGNANCY, URINE      Component Value Range   Preg Test, Ur NEGATIVE    URINE MICROSCOPIC-ADD ON      Component Value Range   Squamous Epithelial / LPF MANY (*) RARE    WBC, UA 0-2  <3 (WBC/hpf)   RBC / HPF 0-2  <3 (RBC/hpf)   Bacteria, UA MANY (*) RARE    Casts HYALINE CASTS (*) NEGATIVE      Dg Abd 2 Views  04/07/2011  *RADIOLOGY REPORT*  Clinical Data: Sudden onset of abdominal pain.  History of ovarian cancer.  ABDOMEN - 2 VIEW  Comparison: None  Findings: Bowel gas pattern is normal without evidence of ileus, obstruction or free air.  Multiple surgical clips  are noted throughout the abdomen and pelvis.  No acute bony finding.  There is been previous hip replacement on the right.  IMPRESSION: No acute finding.  Extensive postoperative changes with multiple surgical clips.  No evidence of ileus, obstruction or free air however.  Original Report Authenticated By: Thomasenia Sales, M.D.     ED COURSE / COORDINATION OF CARE: 10:45 PM  Physical exam complete.  Patient reports relief with Zophran and pain control.  Will order ABD XR.    Orders Placed This Encounter  Procedures  . DG Abd 2 Views  . CBC  . Differential  . Comprehensive metabolic panel  . Lipase, blood  . Urinalysis, Routine w reflex microscopic  . Pregnancy, urine  . Urine microscopic-add on         MDM   MDM: The patient is well-appearing.  Her abdomen is benign on  exam.  Her nausea and vomiting started abruptly this evening she has no signs of pyelonephritis.  She is nontoxic appearing.  She required treatment with pain medicine and antiemetics on arrival.  By the time I evaluated her she was feeling much better.  Labs urine and x-ray are normal in ER.  The patient reports she feels much better this time we'll discharge home with close follow up with her primary care physician   IMPRESSION: Diagnoses that have been ruled out:  Diagnoses that are still under consideration:  Final diagnoses:  Nausea and vomiting     MEDICATIONS GIVEN IN THE E.D. Scheduled Meds:    . HYDROmorphone      .  HYDROmorphone (DILAUDID) injection  1 mg Intravenous Once  . LORazepam  1 mg Intravenous Once  . ondansetron (ZOFRAN) IV  4 mg Intravenous Once   Continuous Infusions:     DISCHARGE MEDICATIONS: New Prescriptions   No medications on file     I personally performed the services described in this documentation, which was scribed in my presence. The recorded information has been reviewed and considered.            Lyanne Co, MD 04/08/11 [phone]

## 2011-04-08 ENCOUNTER — Telehealth: Payer: Self-pay | Admitting: *Deleted

## 2011-04-08 MED ORDER — DICYCLOMINE HCL 20 MG PO TABS: 20.0000 mg | ORAL_TABLET | Freq: Two times a day (BID) | ORAL | Status: DC

## 2011-04-08 MED ORDER — ONDANSETRON HCL 4 MG PO TABS: 8.0000 mg | ORAL_TABLET | Freq: Three times a day (TID) | ORAL | Status: AC | PRN

## 2011-04-08 NOTE — Telephone Encounter (Signed)
[facility] Triage Call Report Triage Record Num: [id number] Operator: Albertine Grates Patient Name: Morgan [name] Call Date & Time: 04/07/2011 6:41:08PM Patient Phone: [phone] PCP: Tera Mater. Clent Ridges Patient Gender: Female PCP Fax : [phone] Patient DOB: 07/22/1965 [facility] Name[facility] Reason for Call: States had spaghettio's for lunch and has developed pain in upper portion of abdomen since 1600. Blood sugar was 150 before breakfast 10-8. Pain is intermittent. No vomiting. Afebrile. Advised to follow up with MD 10-9. Protocol(s) Used: Diabetes: Gastrointestinal Problems Recommended Outcome per Protocol: See Provider within 24 hours Reason for Outcome: Generalized bloating, distension or swelling Care Advice: ~ Go to the ED if have vomited multiple times or have been unable to keep fluids down for more than 2 hours. 04/07/2011 6:51:00PM Page 1 of 1 [id number]

## 2011-04-11 ENCOUNTER — Encounter: Payer: Self-pay | Admitting: Internal Medicine

## 2011-05-02 ENCOUNTER — Ambulatory Visit: Payer: 59 | Admitting: Internal Medicine

## 2011-08-06 ENCOUNTER — Ambulatory Visit (INDEPENDENT_AMBULATORY_CARE_PROVIDER_SITE_OTHER): Payer: 59 | Admitting: Family Medicine

## 2011-08-06 ENCOUNTER — Encounter: Payer: Self-pay | Admitting: Family Medicine

## 2011-08-06 VITALS — BP 120/82 | HR 103 | Temp 98.8°F | Wt 236.0 lb

## 2011-08-06 DIAGNOSIS — J40 Bronchitis, not specified as acute or chronic: Secondary | ICD-10-CM

## 2011-08-06 MED ORDER — HYDROCODONE-HOMATROPINE 5-1.5 MG/5ML PO SYRP: 5.0000 mL | ORAL_SOLUTION | ORAL | Status: AC | PRN

## 2011-08-06 MED ORDER — AMOXICILLIN-POT CLAVULANATE 875-125 MG PO TABS: 1.0000 | ORAL_TABLET | Freq: Two times a day (BID) | ORAL | Status: AC

## 2011-08-06 NOTE — Progress Notes (Signed)
  Subjective:    Patient ID: Morgan [name], female    DOB: 02-06-1966, 46 y.o.   MRN: [id number]  HPI Here for 3 days of chest congestion and coughing up green sputum. No fever.    Review of Systems  Constitutional: Negative.   HENT: Negative.   Eyes: Negative.   Respiratory: Positive for cough and chest tightness. Negative for wheezing.        Objective:   Physical Exam  Constitutional: She appears well-developed and well-nourished.  HENT:  Right Ear: External ear normal.  Left Ear: External ear normal.  Nose: Nose normal.  Mouth/Throat: Oropharynx is clear and moist. No oropharyngeal exudate.  Eyes: Conjunctivae are normal.  Pulmonary/Chest: Effort normal. She has no wheezes. She has no rales.       Scattered rhonchi  Lymphadenopathy:    She has no cervical adenopathy.          Assessment & Plan:  Off work Thursday and Friday

## 2011-09-16 ENCOUNTER — Encounter: Payer: Self-pay | Admitting: Family Medicine

## 2011-09-16 ENCOUNTER — Ambulatory Visit (INDEPENDENT_AMBULATORY_CARE_PROVIDER_SITE_OTHER): Payer: 59 | Admitting: Family Medicine

## 2011-09-16 VITALS — BP 112/80 | HR 99 | Temp 98.4°F | Wt 234.0 lb

## 2011-09-16 DIAGNOSIS — J329 Chronic sinusitis, unspecified: Secondary | ICD-10-CM

## 2011-09-16 MED ORDER — AZITHROMYCIN 250 MG PO TABS: ORAL_TABLET | ORAL | Status: DC

## 2011-09-17 ENCOUNTER — Encounter: Payer: Self-pay | Admitting: Family Medicine

## 2011-09-17 NOTE — Progress Notes (Signed)
  Subjective:    Patient ID: Morgan [name], female    DOB: 02/23/1966, 46 y.o.   MRN: [id number]  HPI Here for recurrent symptoms of sinus pressure, PND, ST, and coughing up green sputum. No fever. She took a course of Augmentin in early February, which helped for a week or so, but the symptoms have returned.    Review of Systems  Constitutional: Negative.   HENT: Positive for congestion, postnasal drip and sinus pressure.   Eyes: Negative.   Respiratory: Positive for cough.        Objective:   Physical Exam  Constitutional: She appears well-developed and well-nourished.  HENT:  Right Ear: External ear normal.  Left Ear: External ear normal.  Nose: Nose normal.  Mouth/Throat: Oropharynx is clear and moist. No oropharyngeal exudate.  Eyes: Conjunctivae are normal.  Pulmonary/Chest: Effort normal and breath sounds normal. No respiratory distress. She has no wheezes. She has no rales.  Lymphadenopathy:    She has no cervical adenopathy.          Assessment & Plan:  Partially treated sinusitis. Try a Zpack and add Mucinex

## 2011-10-01 ENCOUNTER — Telehealth: Payer: Self-pay

## 2011-10-01 NOTE — Telephone Encounter (Signed)
Pt was seen by Dr. Clent Ridges on 09/16/11.  Pt states she was told if she was still having coughing spells to call back and a chest x-ray would be ordered.  Pt is aware Dr. Clent Ridges is out of the office until 10/06/11.  Pls advise.

## 2011-10-01 NOTE — Telephone Encounter (Signed)
I tried to reach pt and no answer, left voice message. If pt's symptoms worsen then she should seek out a Urgent Care, if not then it will be next week before the doctor gets back in the office.

## 2011-10-31 ENCOUNTER — Telehealth: Payer: Self-pay | Admitting: *Deleted

## 2011-10-31 ENCOUNTER — Ambulatory Visit (INDEPENDENT_AMBULATORY_CARE_PROVIDER_SITE_OTHER)
Admission: RE | Admit: 2011-10-31 | Discharge: 2011-10-31 | Disposition: A | Discharge status: Home / Self Care | Payer: 59 | Source: Ambulatory Visit | Attending: Family Medicine | Admitting: Family Medicine

## 2011-10-31 DIAGNOSIS — R05 Cough: Secondary | ICD-10-CM

## 2011-10-31 DIAGNOSIS — R059 Cough, unspecified: Secondary | ICD-10-CM

## 2011-10-31 NOTE — Telephone Encounter (Signed)
I already put in the order for a CXR. Tell her to go to [facility] anytime to get this done

## 2011-10-31 NOTE — Progress Notes (Signed)
Quick Note:  Spoke with pt ______ 

## 2011-10-31 NOTE — Telephone Encounter (Signed)
Pt is still coughing constantly, and Dr Clent Ridges had said he would get a chest xray if she had no improvement by today.  Okay to order chest xray????  [facility]

## 2011-10-31 NOTE — Telephone Encounter (Signed)
I spoke with pt and gave below information.  

## 2011-11-07 ENCOUNTER — Ambulatory Visit (INDEPENDENT_AMBULATORY_CARE_PROVIDER_SITE_OTHER): Payer: 59 | Admitting: Family Medicine

## 2011-11-07 ENCOUNTER — Encounter: Payer: Self-pay | Admitting: Family Medicine

## 2011-11-07 VITALS — BP 120/80 | HR 83 | Temp 98.6°F | Wt 245.0 lb

## 2011-11-07 DIAGNOSIS — I10 Essential (primary) hypertension: Secondary | ICD-10-CM

## 2011-11-07 DIAGNOSIS — R05 Cough: Secondary | ICD-10-CM

## 2011-11-07 DIAGNOSIS — R059 Cough, unspecified: Secondary | ICD-10-CM

## 2011-11-07 MED ORDER — LOSARTAN POTASSIUM-HCTZ 50-12.5 MG PO TABS: 1.0000 | ORAL_TABLET | Freq: Every day | ORAL | Status: DC

## 2011-11-07 MED ORDER — OMEPRAZOLE 40 MG PO CPDR: 40.0000 mg | DELAYED_RELEASE_CAPSULE | Freq: Every day | ORAL | Status: DC

## 2011-11-07 NOTE — Progress Notes (Signed)
  Subjective:    Patient ID: Morgan [name], female    DOB: [birth year], 46 y.o.   MRN: [id number]  HPI Here for a continued dry tickling cough. She had a normal CXR on 10-31-11. She feels fine otherwise. No heartburn or indigestion. No allergy symptoms or PND.    Review of Systems  Constitutional: Negative.   HENT: Negative.   Eyes: Negative.   Respiratory: Positive for cough. Negative for shortness of breath and wheezing.   Cardiovascular: Negative.   Gastrointestinal: Negative.        Objective:   Physical Exam  Constitutional: She appears well-developed and well-nourished.  Neck: Neck supple. No thyromegaly present.  Cardiovascular: Normal rate, regular rhythm, normal heart sounds and intact distal pulses.   Pulmonary/Chest: Effort normal and breath sounds normal. No respiratory distress. She has no wheezes. She has no rales.  Lymphadenopathy:    She has no cervical adenopathy.          Assessment & Plan:  This cough is probably related to her ACE inhibiter, although silent reflux could also be a cause. We will start her on Omeprazole in the evenings. Switch from Lisinopril HCT to Losartan HCT. Recheck one month

## 2011-11-17 ENCOUNTER — Telehealth: Payer: Self-pay | Admitting: Family Medicine

## 2011-11-17 NOTE — Telephone Encounter (Signed)
[facility] Triage Call Report Triage Record Num: [id number] Operator: Sula Rumple Patient Name: Morgan [name] Call Date & Time: 11/15/2011 9:09:07PM Patient Phone: [phone] PCP: Tera Mater. Clent Ridges Patient Gender: Female PCP Fax : [phone] Patient DOB: 1966-06-26 [facility] Name[facility] Reason for Call: Caller: /Patient; PCP: Nelwyn Salisbury.; CB#: [phone]; Call regarding Cough/Congestion; Pt Morgan [name] calling on 11/15/11 states she has been coughing/was taken off one of her BP medications/and was given something for acid reflux. Pt was seen 1.5 wks ago. Pt has been coughing and gagging and coughing since 11/13/11/pt was advised to let medication work for 2 wks. Pt is asking what medication she can take for cough/per Dr Fabian Sharp: pt may have Tessalon Pearls 100 mg TID prn/ dispense 20. [facility] [phone] Protocol(s) Used: Cough - Adult Recommended Outcome per Protocol: See Provider within 24 hours Reason for Outcome: Onset of cough after starting new prescription or changing dose of medication, especially ACE inhibitors or beta blockers Care Advice: ~ Call provider for advice about stopping or continuing medication(s). Most adults need to drink 6-10 eight-ounce glasses (1.2-2.0 liters) of fluids per day unless previously told to limit fluid intake for other medical reasons. Limit fluids that contain caffeine, sugar or alcohol. Urine will be a very light yellow color when you drink enough fluids. ~ Medication Advice: - Discontinue all nonprescription and alternative medications, especially stimulants, until evaluated by provider. - Take prescribed medications as directed, following label instructions for the medication. - Do not change medications or dosing regimen until provider is consulted. - Know possible side effects of medication and what to do if they occur. - Tell provider all prescription, nonprescription or alternative medications that you  take ~ 05/

## 2011-11-19 ENCOUNTER — Telehealth: Payer: Self-pay

## 2011-11-19 NOTE — Telephone Encounter (Signed)
Have her see me again OV

## 2011-11-19 NOTE — Telephone Encounter (Signed)
VM from tirage from 1155 - pt still having heavy cough - called oncall doctor this past weekend and was called out RX - not helping . Does she need to be seen  Call [phone]

## 2011-11-19 NOTE — Telephone Encounter (Signed)
Can you call and schedule a appointment?

## 2011-11-20 NOTE — Telephone Encounter (Signed)
Pt called back and said that she will be able to keep her ov as schd.

## 2011-11-20 NOTE — Telephone Encounter (Signed)
Called and schd pt for ov tomorrow 11/21/11 at 8:30am. Pt may call back to cx, if she can not get off work.

## 2011-11-21 ENCOUNTER — Encounter: Payer: Self-pay | Admitting: Family Medicine

## 2011-11-21 ENCOUNTER — Ambulatory Visit (INDEPENDENT_AMBULATORY_CARE_PROVIDER_SITE_OTHER): Payer: 59 | Admitting: Family Medicine

## 2011-11-21 VITALS — BP 120/84 | HR 83 | Temp 98.6°F | Wt 240.0 lb

## 2011-11-21 DIAGNOSIS — R05 Cough: Secondary | ICD-10-CM

## 2011-11-21 DIAGNOSIS — R059 Cough, unspecified: Secondary | ICD-10-CM

## 2011-11-21 DIAGNOSIS — R053 Chronic cough: Secondary | ICD-10-CM

## 2011-11-21 MED ORDER — METHYLPREDNISOLONE ACETATE 80 MG/ML IJ SUSP
120.0000 mg | Freq: Once | INTRAMUSCULAR | Status: AC
  Administered 2011-11-21: 120 mg via INTRAMUSCULAR

## 2011-11-21 MED ORDER — HYDROCODONE-HOMATROPINE 5-1.5 MG/5ML PO SYRP: 5.0000 mL | ORAL_SOLUTION | ORAL | Status: AC | PRN

## 2011-11-21 MED ORDER — LEVOFLOXACIN 500 MG PO TABS: 500.0000 mg | ORAL_TABLET | Freq: Every day | ORAL | Status: AC

## 2011-11-21 NOTE — Progress Notes (Signed)
  Subjective:    Patient ID: Morgan [name], female    DOB: 1966-01-26, 46 y.o.   MRN: [id number]  HPI Here for a cough that has been going on for over a month. We are treating for possible GERD, but this has not helped. We stopped her ACE inhibitor, but this has not helped. She still has a hard dry cough that comes and goes. No fever. Tried Tessalon Perles and guaifenesin with no relief.    Review of Systems  Constitutional: Negative.   HENT: Negative.   Eyes: Negative.   Respiratory: Positive for cough.   Cardiovascular: Negative.        Objective:   Physical Exam  Constitutional: She appears well-developed and well-nourished.  HENT:  Right Ear: External ear normal.  Left Ear: External ear normal.  Nose: Nose normal.  Mouth/Throat: Oropharynx is clear and moist.  Eyes: Conjunctivae are normal.  Pulmonary/Chest: Effort normal and breath sounds normal. No respiratory distress. She has no wheezes. She has no rales.  Lymphadenopathy:    She has no cervical adenopathy.          Assessment & Plan:  Prolonged cough. Treat for a possible bronchitis. Refer to Pulmonary

## 2011-11-21 NOTE — Progress Notes (Signed)
Addended by: Aniceto Boss A on: 11/21/2011 09:45 AM   Modules accepted: Orders

## 2011-11-26 ENCOUNTER — Encounter: Payer: Self-pay | Admitting: Internal Medicine

## 2011-11-26 ENCOUNTER — Ambulatory Visit (INDEPENDENT_AMBULATORY_CARE_PROVIDER_SITE_OTHER): Payer: 59 | Admitting: Internal Medicine

## 2011-11-26 VITALS — BP 138/88 | HR 79 | Temp 97.6°F | Ht 66.0 in | Wt 243.6 lb

## 2011-11-26 DIAGNOSIS — R05 Cough: Secondary | ICD-10-CM | POA: Insufficient documentation

## 2011-11-26 DIAGNOSIS — R059 Cough, unspecified: Secondary | ICD-10-CM

## 2011-11-26 DIAGNOSIS — R053 Chronic cough: Secondary | ICD-10-CM | POA: Insufficient documentation

## 2011-11-26 MED ORDER — FLUTICASONE PROPIONATE 50 MCG/ACT NA SUSP: 2.0000 | Freq: Every day | NASAL | Status: DC

## 2011-11-26 NOTE — Patient Instructions (Signed)
Cough is from sinus drainage, acid reflux, possible asthma, All of this is working together to cause cyclical cough/LPR cough  #Sinus drainage  - start/continue netti pot daily (nurse will show you picture) - - start nasal steroid generic fluticasone inhaler 2 squirts each nostril daily as advised (nurse will do script)  #Possible Acid Reflux  - take the acid reflux medication Dr Clent Ridges advised but you need to call us tomorrow and let us know what it is   - take diet sheet from [place] - avoid colas, spices, cheeses, spirits, red meats, beer, chocolates, fried foods etc.,   - sleep with head end of bed elevated  - eat small frequent meals  - do not go to bed for 3 hours after last meal  #Possible Asthma  - do methacholine challenge test (you cannot be pregnant or have heart disease for this test) next few days  - once test is done I will call you with results and advise next step  -  #Cyclical cough  - please choose 2-3 days and observe complete voice rest - no talking or whispering  - at all times there  there is urge to cough, drink water or swallow or sip on throat lozenge  #Followup - I will see you in 4  weeks but have methacholine challenge test in 1 week and I will call you with results - any problems call or come sooner

## 2011-11-26 NOTE — Assessment & Plan Note (Signed)
Cough is from sinus drainage, acid reflux, possible asthma, All of this is working together to cause cyclical cough/LPR cough Advised 100% compliance to instruction, patience in order to see results  #Sinus drainage  - start/continue netti pot daily (nurse will show you picture) - - start nasal steroid generic fluticasone inhaler 2 squirts each nostril daily as advised (nurse will do script)  #Possible Acid Reflux  - take the acid reflux medication Dr Clent Ridges advised but you need to call us tomorrow and let us know what it is   - take diet sheet from [place] - avoid colas, spices, cheeses, spirits, red meats, beer, chocolates, fried foods etc.,   - sleep with head end of bed elevated  - eat small frequent meals  - do not go to bed for 3 hours after last meal  #Possible Asthma  - do methacholine challenge test (you cannot be pregnant or have heart disease for this test) next few days  - once test is done I will call you with results and advise next step  -  #Cyclical cough  - please choose 2-3 days and observe complete voice rest - no talking or whispering  - at all times there  there is urge to cough, drink water or swallow or sip on throat lozenge  #Followup - I will see you in 4  weeks but have methacholine challenge test in 1 week and I will call you with results - any problems call or come sooner

## 2011-11-26 NOTE — Progress Notes (Signed)
Subjective:    Patient ID: Morgan [name], female    DOB: 14-Nov-1965, 46 y.o.   MRN: [id number]  HPI  IOV 11/26/2011  46 year old female. PCP is FRY,STEPHEN A, MD .  Body mass index is 39.32 kg/(m^2).  reports that she has never smoked. She has never used smokeless tobacco.   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 Cancer survivor (GYn and melanoma > 10 years ago) Referred for chronic cough. Insidious onset for 1 year. Denies URI at onset. Progressive past 6-7 months even before pollen season. Was on ace inhibitor; taken off 3-4 weeks ago and cough transiently improved for few days but cough worsened again. Got trial with antibiotics and hyocodan but no relief. Cough does not happen when walking or standing up but happens when sitting or lying down. Daily cough. Quality is dry and deep. Severity is moderate but was severe.  She has associated sensation of palpitation with cough, occasional feeling of tickle in throat but feels cough is coming from throat. Denies headache, sinus drainage, or current GERD (had GERD in past) but recently placed on a PPI NOS (she does not know name) since 11/21/11 but she does not think it is helping. In terms of diet: 1 mountain dew a day, one pizza every 2 months, eats daily fried food but no chocolate but does eat lot of frozen meals. Cough is socially embarrassing.   Note: she is "tired taking of medications" - does not want window dressing or symptom relief Rx. She wants to know why she has cough and wants curative Rx.    Dr Gretta Cool Reflux Symptom Index (> 13-15 suggestive of LPR cough) 0 -> 5  =  none ->severe problem  Hoarseness of problem with voice 2  Clearing  Of Throat 4  Excess throat mucus or feeling of post nasal drip 0  Difficulty swallowing food, liquid or tablets 3 with emphasis on food  Cough after eating or lying down 5  Breathing difficulties or choking episodes 5 with emphasis on choking    Troublesome or annoying cough 5  Sensation of something sticking in throat or lump in throat 4  Heartburn, chest pain, indigestion, or stomach acid coming up 0  TOTAL 30     Kouffman Reflux v Neurogenic Cough Differentiator Reflux Neurogenic Comments  Do you awaken from a sound sleep coughing violently?                            With trouble breathing? Yes    Do you have choking episodes when you cannot  Get enough air, gasping for air ?                  Do you usually cough when you lie down into  The bed, or when you just lie down to rest ?                          Yes    Do you usually cough after meals or eating?         Yes    Do you cough when (or after) you bend over?        Do you more-or-less cough all day long?  Yes   Does change of temperature make you cough?  Yes   Does laughing or chuckling cause you to cough?  YEs   Do fumes (perfume, automobile fumes, burned  Toast, etc.,) cause you to cough ?       Yes   Does speaking, singing, or talking on the phone cause you to cough   ?                   Total 3 4      Labs July 2011 - ct abd lung cut - lung bases clear  CXR 10/31/11 - clear  Past Medical History  Diagnosis Date  . Diabetes mellitus   . Hyperlipidemia   . Hypertension   . Ovarian cancer   . Arthritis   . GERD (gastroesophageal reflux disease)      Family History  Problem Relation Age of Onset  . Arthritis    . Diabetes    . Colon cancer    . Prostate cancer    . Breast cancer    . Coronary artery disease       History   Social History  . Marital Status: Single    Spouse Name: N/A    Number of Children: N/A  . Years of Education: N/A   Occupational History  . billing     Social History Main Topics  . Smoking status: Never Smoker   . Smokeless tobacco: Never Used  . Alcohol Use: No  . Drug Use: No  . Sexually Active: Not on file   Other Topics Concern  . Not on file   Social History Narrative  . No narrative on file      Allergies  Allergen Reactions  . Lisinopril Cough     Outpatient Prescriptions Prior to Visit  Medication Sig Dispense Refill  . atorvastatin (LIPITOR) 40 MG tablet Take 1 tablet (40 mg total) by mouth daily.  30 tablet  11  . calcium carbonate (TUMS - DOSED IN MG ELEMENTAL CALCIUM) 500 MG chewable tablet Chew 2 tablets by mouth daily as needed. For indigestion        . HYDROcodone-homatropine (HYDROMET) 5-1.5 MG/5ML syrup Take 5 mLs by mouth every 4 (four) hours as needed for cough.  240 mL  0  . levofloxacin (LEVAQUIN) 500 MG tablet Take 1 tablet (500 mg total) by mouth daily.  10 tablet  0  . losartan-hydrochlorothiazide (HYZAAR) 50-12.5 MG per tablet Take 1 tablet by mouth daily.  30 tablet  11  . metFORMIN (GLUMETZA) 500 MG (MOD) 24 hr tablet Take 500 mg by mouth every evening.        . metoprolol tartrate (LOPRESSOR) 25 MG tablet Take 1 tablet (25 mg total) by mouth 2 (two) times daily.  60 tablet  11  . omeprazole (PRILOSEC) 40 MG capsule Take 1 capsule (40 mg total) by mouth daily.  30 capsule  2       Review of Systems  Constitutional: Negative for fever and unexpected weight change.  HENT: Negative for ear pain, nosebleeds, congestion, sore throat, rhinorrhea, sneezing, trouble swallowing, dental problem, postnasal drip and sinus pressure.   Eyes: Negative for redness and itching.  Respiratory: Positive for cough. Negative for chest tightness, shortness of breath and wheezing.   Cardiovascular: Negative for palpitations and leg swelling.  Gastrointestinal: Negative for nausea and vomiting.  Genitourinary: Negative for dysuria.  Musculoskeletal: Negative for joint swelling.  Skin: Negative for rash.  Neurological: Negative for headaches.  Hematological: Does not bruise/bleed easily.  Psychiatric/Behavioral: Negative for dysphoric mood. The patient is not nervous/anxious.  Objective:   Physical Exam  Vitals reviewed. Constitutional: She is oriented to person,  place, and time. She appears well-developed and well-nourished. No distress.       Body mass index is 39.32 kg/(m^2).   HENT:  Head: Normocephalic and atraumatic.  Right Ear: External ear normal.  Left Ear: External ear normal.  Mouth/Throat: Oropharynx is clear and moist. No oropharyngeal exudate.  Eyes: Conjunctivae and EOM are normal. Pupils are equal, round, and reactive to light. Right eye exhibits no discharge. Left eye exhibits no discharge. No scleral icterus.  Neck: Normal range of motion. Neck supple. No JVD present. No tracheal deviation present. No thyromegaly present.  Cardiovascular: Normal rate, regular rhythm, normal heart sounds and intact distal pulses.  Exam reveals no gallop and no friction rub.   No murmur heard. Pulmonary/Chest: Effort normal and breath sounds normal. No respiratory distress. She has no wheezes. She has no rales. She exhibits no tenderness.       Barking quality to cough Scar in central chest +  Abdominal: Soft. Bowel sounds are normal. She exhibits no distension and no mass. There is no tenderness. There is no rebound and no guarding.  Musculoskeletal: Normal range of motion. She exhibits no edema and no tenderness.  Lymphadenopathy:    She has no cervical adenopathy.  Neurological: She is alert and oriented to person, place, and time. She has normal reflexes. No cranial nerve deficit. She exhibits normal muscle tone. Coordination normal.  Skin: Skin is warm and dry. No rash noted. She is not diaphoretic. No erythema. No pallor.  Psychiatric: She has a normal mood and affect. Her behavior is normal. Judgment and thought content normal.          Assessment & Plan:

## 2011-12-04 ENCOUNTER — Ambulatory Visit (HOSPITAL_COMMUNITY)
Admission: RE | Admit: 2011-12-04 | Discharge: 2011-12-04 | Disposition: A | Discharge status: Home / Self Care | Payer: 59 | Source: Ambulatory Visit | Attending: Internal Medicine | Admitting: Internal Medicine

## 2011-12-04 DIAGNOSIS — R053 Chronic cough: Secondary | ICD-10-CM

## 2011-12-04 DIAGNOSIS — R05 Cough: Secondary | ICD-10-CM | POA: Insufficient documentation

## 2011-12-04 DIAGNOSIS — R059 Cough, unspecified: Secondary | ICD-10-CM | POA: Insufficient documentation

## 2011-12-04 LAB — PULMONARY FUNCTION TEST

## 2011-12-04 MED ORDER — METHACHOLINE 0.25 MG/ML NEB SOLN
2.0000 mL | Freq: Once | RESPIRATORY_TRACT | Status: AC
  Administered 2011-12-04: 0.5 mg via RESPIRATORY_TRACT

## 2011-12-04 MED ORDER — METHACHOLINE 0.0625 MG/ML NEB SOLN
2.0000 mL | Freq: Once | RESPIRATORY_TRACT | Status: AC
  Administered 2011-12-04: 0.125 mg via RESPIRATORY_TRACT

## 2011-12-04 MED ORDER — METHACHOLINE 1 MG/ML NEB SOLN
2.0000 mL | Freq: Once | RESPIRATORY_TRACT | Status: AC
  Administered 2011-12-04: 2 mg via RESPIRATORY_TRACT

## 2011-12-04 MED ORDER — SODIUM CHLORIDE 0.9 % IN NEBU
3.0000 mL | INHALATION_SOLUTION | Freq: Once | RESPIRATORY_TRACT | Status: AC
  Administered 2011-12-04: 3 mL via RESPIRATORY_TRACT

## 2011-12-04 MED ORDER — METHACHOLINE 4 MG/ML NEB SOLN
2.0000 mL | Freq: Once | RESPIRATORY_TRACT | Status: AC
  Administered 2011-12-04: 8 mg via RESPIRATORY_TRACT

## 2011-12-04 MED ORDER — ALBUTEROL SULFATE (5 MG/ML) 0.5% IN NEBU
2.5000 mg | INHALATION_SOLUTION | Freq: Once | RESPIRATORY_TRACT | Status: AC
  Administered 2011-12-04: 2.5 mg via RESPIRATORY_TRACT

## 2011-12-04 MED ORDER — METHACHOLINE 16 MG/ML NEB SOLN
2.0000 mL | Freq: Once | RESPIRATORY_TRACT | Status: AC
Start: 2011-12-04 — End: 2011-12-04
  Administered 2011-12-04: 32 mg via RESPIRATORY_TRACT

## 2011-12-11 ENCOUNTER — Telehealth: Payer: Self-pay | Admitting: Internal Medicine

## 2011-12-11 NOTE — Telephone Encounter (Signed)
Test results received and given to MR for review.

## 2011-12-11 NOTE — Telephone Encounter (Signed)
As of 13.30h, it was not on my desk and as of 2:41 PM I cannot see it in my inbox which [employer]. Please call [phone] and have pft lab fax it. I can look at it . You can bring it to me on side A

## 2011-12-11 NOTE — Telephone Encounter (Signed)
metacholine 12/04/11 is negative. Cintinue advice. No new interventions. Will see her July 8

## 2011-12-11 NOTE — Telephone Encounter (Signed)
Pt is requesting methacholine challenge test results from 12/04/11. Please advise if you have reviewed this yet MR ,thanks

## 2011-12-12 NOTE — Telephone Encounter (Signed)
Pt returned the call to triage---she is aware that per MR--metacholine is negative.  No new interventions and we will see her at her appt on July 8.  Pt voiced her understanding of this and nothing further was needed.

## 2011-12-12 NOTE — Telephone Encounter (Signed)
lmomtcb x1 for pt 

## 2011-12-16 ENCOUNTER — Telehealth: Payer: Self-pay | Admitting: Internal Medicine

## 2011-12-16 NOTE — Telephone Encounter (Signed)
Take doxycycline 100mg  po twice daily x 8 days; take after meals and avoid sunlight  Take prednisone 40 mg daily x 2 days, then 20mg  daily x 2 days, then 10mg  daily x 2 days, then 5mg  daily x 2 days and stop   Both for acute sinusitis  Ensure that the netti pot water is bottled or distilled water that is warmed up and she should continue using netti pot

## 2011-12-16 NOTE — Telephone Encounter (Signed)
LMOM TCB x1.  Advised pt on answering machine that she may call the answering service after hours if needed or can call back tomorrow.

## 2011-12-16 NOTE — Telephone Encounter (Signed)
Spoke with pt. She is c/o sneezing and prod cough with white sputum, and sinus drainage with green nasal d/c. Denies any other complaints. I offered ov with TP for tomorrow but she declined. Would like recs from MR. Ok with waiting until tomorrow for response. Please advise, thanks! Allergies  Allergen Reactions  . Lisinopril Cough

## 2011-12-16 NOTE — Telephone Encounter (Signed)
Pt returned call.  Morgan [name] ° °

## 2011-12-17 MED ORDER — PREDNISONE 10 MG PO TABS: ORAL_TABLET | ORAL | Status: DC

## 2011-12-17 MED ORDER — DOXYCYCLINE HYCLATE 100 MG PO TABS: 100.0000 mg | ORAL_TABLET | Freq: Two times a day (BID) | ORAL | Status: AC

## 2011-12-17 NOTE — Telephone Encounter (Signed)
Called, spoke with pt.  I informed her MR recs she take doxy 100 mg po bid  X 8 days and pred taper as directed below.  She is aware to take doxy after meals and avoid sunlight.  Pt also aware to cont with neti pot which she is using with warm, bottled water.  Both rxs sent to [facility] -- pt aware.  She verbalized understanding of instructions and was advised to call back or seek emergency care if symptoms do not improve or worsen.  Nothing further needed at this time.

## 2011-12-26 ENCOUNTER — Ambulatory Visit: Payer: 59 | Admitting: Adult Health

## 2011-12-28 ENCOUNTER — Other Ambulatory Visit: Payer: Self-pay | Admitting: Family Medicine

## 2012-01-05 ENCOUNTER — Encounter: Payer: Self-pay | Admitting: Internal Medicine

## 2012-01-05 ENCOUNTER — Other Ambulatory Visit: Payer: Self-pay | Admitting: Family Medicine

## 2012-01-05 ENCOUNTER — Ambulatory Visit (INDEPENDENT_AMBULATORY_CARE_PROVIDER_SITE_OTHER): Payer: 59 | Admitting: Internal Medicine

## 2012-01-05 VITALS — BP 116/82 | HR 101 | Temp 98.2°F | Ht 66.0 in | Wt 243.4 lb

## 2012-01-05 DIAGNOSIS — R053 Chronic cough: Secondary | ICD-10-CM

## 2012-01-05 DIAGNOSIS — R059 Cough, unspecified: Secondary | ICD-10-CM

## 2012-01-05 DIAGNOSIS — R05 Cough: Secondary | ICD-10-CM

## 2012-01-05 NOTE — Progress Notes (Signed)
Subjective:    Patient ID: Dixie Dials, female    DOB: Jun 24, 1966, 46 y.o.   MRN: [id number]  HPI IOV 11/26/2011  46 year old female. PCP is FRY,STEPHEN A, MD .  Body mass index is 39.32 kg/(m^2).  reports that she has never smoked. She has never used smokeless tobacco.   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 Cancer survivor (GYn and melanoma > 10 years ago) Referred for chronic cough. Insidious onset for 1 year. Denies URI at onset. Progressive past 6-7 months even before pollen season. Was on ace inhibitor; taken off 3-4 weeks ago and cough transiently improved for few days but cough worsened again. Got trial with antibiotics and hyocodan but no relief. Cough does not happen when walking or standing up but happens when sitting or lying down. Daily cough. Quality is dry and deep. Severity is moderate but was severe.  She has associated sensation of palpitation with cough, occasional feeling of tickle in throat but feels cough is coming from throat. Denies headache, sinus drainage, or current GERD (had GERD in past) but recently placed on a PPI NOS (she does not know name) since 11/21/11 but she does not think it is helping. In terms of diet: 1 mountain dew a day, one pizza every 2 months, eats daily fried food but no chocolate but does eat lot of frozen meals. Cough is socially embarrassing. RSI cough score is 32 and Kouffman differntiator 3 for GERD and 4 for neurogenic/airways   Note: she is "tired taking of medications" - does not want window dressing or symptom relief Rx. She wants to know why she has cough and wants curative Rx.    Labs July 2011 - ct abd lung cut - lung bases clear  CXR 10/31/11 - clear  Cough is from sinus drainage, acid reflux, possible asthma,  All of this is working together to cause cyclical cough/LPR cough  #Sinus drainage  - start/continue netti pot daily (nurse will show you picture)  - - start nasal steroid  generic fluticasone inhaler 2 squirts each nostril daily as advised (nurse will do script)  #Possible Acid Reflux  - take the acid reflux medication Dr Clent Ridges advised but you need to call us tomorrow and let us know what it is  - take diet sheet from [place] - avoid colas, spices, cheeses, spirits, red meats, beer, chocolates, fried foods etc.,  - sleep with head end of bed elevated  - eat small frequent meals  - do not go to bed for 3 hours after last meal  #Possible Asthma  - do methacholine challenge test (you cannot be pregnant or have heart disease for this test) next few days  - once test is done I will call you with results and advise next step  -  #Cyclical cough  - please choose 2-3 days and observe complete voice rest - no talking or whispering  - at all times there there is urge to cough, drink water or swallow or sip on throat lozenge  #Followup  - I will see you in 4 weeks but have methacholine challenge test in 1 week and I will call you with results  - any problems call or come sooner   OV 01/05/2012  Methacholine7/10/13  was negative. REcent sinusitis, dox and pred called in. Loves netti pot. Did not use nasal steroids. Followed gerd diet advice. Did not take ppi but took otc h2 blockade. With  all this cough almost resolved (see below). RSI cough score now 4  And Kouffman differentiator 1 for GERD and 0 for airway    Dr Gretta Cool Reflux Symptom Index (> 13-15 suggestive of LPR cough) 0 -> 5  =  none ->severe problem 11/26/11 01/05/2012   Hoarseness of problem with voice 2 1  Clearing  Of Throat 4 3  Excess throat mucus or feeling of post nasal drip 0 0  Difficulty swallowing food, liquid or tablets 3 with emphasis on food 0  Cough after eating or lying down 5 0  Breathing difficulties or choking episodes 5 with emphasis on choking 0  Troublesome or annoying cough 5 0  Sensation of something sticking in throat or lump in throat 4 0  Heartburn, chest pain, indigestion, or stomach  acid coming up 0 0  TOTAL 30 4   Past, Family, Social reviewed: no change since last visit   Review of Systems  Constitutional: Negative for fever and unexpected weight change.  HENT: Negative for ear pain, nosebleeds, congestion, sore throat, rhinorrhea, sneezing, trouble swallowing, dental problem, postnasal drip and sinus pressure.   Eyes: Negative for redness and itching.  Respiratory: Negative for cough, chest tightness, shortness of breath and wheezing.   Cardiovascular: Negative for palpitations and leg swelling.  Gastrointestinal: Negative for nausea and vomiting.  Genitourinary: Negative for dysuria.  Musculoskeletal: Negative for joint swelling.  Skin: Negative for rash.  Neurological: Negative for headaches.  Hematological: Does not bruise/bleed easily.  Psychiatric/Behavioral: Negative for dysphoric mood. The patient is not nervous/anxious.        Objective:   Physical Exam Vitals reviewed. Constitutional: She is oriented to person, place, and time. She appears well-developed and well-nourished. No distress.       Body mass index is 39.32 kg/(m^2).   HENT:  Head: Normocephalic and atraumatic.  Right Ear: External ear normal.  Left Ear: External ear normal.  Mouth/Throat: Oropharynx is clear and moist. No oropharyngeal exudate.  Eyes: Conjunctivae and EOM are normal. Pupils are equal, round, and reactive to light. Right eye exhibits no discharge. Left eye exhibits no discharge. No scleral icterus.  Neck: Normal range of motion. Neck supple. No JVD present. No tracheal deviation present. No thyromegaly present.  Cardiovascular: Normal rate, regular rhythm, normal heart sounds and intact distal pulses.  Exam reveals no gallop and no friction rub.   No murmur heard. Pulmonary/Chest: Effort normal and breath sounds normal. No respiratory distress. She has no wheezes. She has no rales. She exhibits no tenderness.       Barking quality to cough is resolved Scar in central  chest +  Abdominal: Soft. Bowel sounds are normal. She exhibits no distension and no mass. There is no tenderness. There is no rebound and no guarding.  Musculoskeletal: Normal range of motion. She exhibits no edema and no tenderness.  Lymphadenopathy:    She has no cervical adenopathy.  Neurological: She is alert and oriented to person, place, and time. She has normal reflexes. No cranial nerve deficit. She exhibits normal muscle tone. Coordination normal.  Skin: Skin is warm and dry. No rash noted. She is not diaphoretic. No erythema. No pallor.  Psychiatric: She has a normal mood and affect. Her behavior is normal. Judgment and thought content normal.          Assessment & Plan:

## 2012-01-05 NOTE — Telephone Encounter (Signed)
Is this the correct medication? There is another Metormin listed in the pt's snapshot?

## 2012-01-05 NOTE — Patient Instructions (Addendum)
Glad your cough is almost gonne Continue netti pot saline wash atleast 2-3 times per week with distilled water Continue acid reflux control with otc ranitidine/pepcid and diet REturn if cough returns Aim to lose weight ultimately  

## 2012-01-06 NOTE — Telephone Encounter (Signed)
Change to 500 mg bid. Call in #[phone] with 11 rf

## 2012-01-09 NOTE — Assessment & Plan Note (Signed)
Glad your cough is almost gonne Continue netti pot saline wash atleast 2-3 times per week with distilled water Continue acid reflux control with otc ranitidine/pepcid and diet REturn if cough returns Aim to lose weight ultimately

## 2012-01-12 ENCOUNTER — Other Ambulatory Visit: Payer: Self-pay | Admitting: Family Medicine

## 2012-04-02 ENCOUNTER — Telehealth: Payer: Self-pay | Admitting: Family Medicine

## 2012-04-02 NOTE — Telephone Encounter (Signed)
[facility] Triage Call Report Triage Record Num: [id number] Operator: Albertine Grates Patient Name: Morgan [name] Call Date & Time: 04/01/2012 10:27:16PM Patient Phone: [phone] PCP: Tera Mater. Clent Ridges Patient Gender: Female PCP Fax : [phone] Patient DOB: 14-Jun-1966 [facility] Name[facility] Reason for Call: Caller: Verleen/Patient; PCP: Gershon Crane [facility]); CB#: [phone]; States has been checking blood sugar more frequently for past 2 weeks and checks in morning when wakes. Read on internet to check prior to going to bed and has checked 10-3. Blood sugar is 290. Has been more thirsty than usual and voiding more than usual for past 1 month. Has not had A1C checked "in a long time". Blood sugar has been 218 when checks in morning. Advised follow up with MD 10-4 but states is going out of town and will schedule to see 10-7. Protocol(s) Used: Diabetes: Control Problems Recommended Outcome per Protocol: See Provider within 24 hours Reason for Outcome: Symptoms of early hyperglycemia (unusually thirsty, frequent urination, feeling tired or weak) Care Advice: Follow the usual meal plan if possible. Drink extra non-caloric fluids. If unable to eat at all, drink regular soft drinks and juices so that 50 grams of carbohydrates are taken in every 3 to 4 hours. ~ Go to ED immediately if blood sugar is 300 mg/dl or more AND symptoms are present including: large urine ketones, rapid, deep breathing, fruity breath, nausea, vomiting or abdominal pain; confused thinking, dry, flushed skin; extreme thirst, extreme fatigue or weakness; or frequent urination. ~ 10/

## 2012-04-12 ENCOUNTER — Other Ambulatory Visit: Payer: Self-pay | Admitting: Family Medicine

## 2012-05-03 IMAGING — CR DG PORTABLE PELVIS
1 series · 1 of 1 positions shown · non-contrast
Comparison: 11/22/2010

CLINICAL DATA: Right hip arthroplasty, postop exam

PORTABLE PELVIS

[AP]
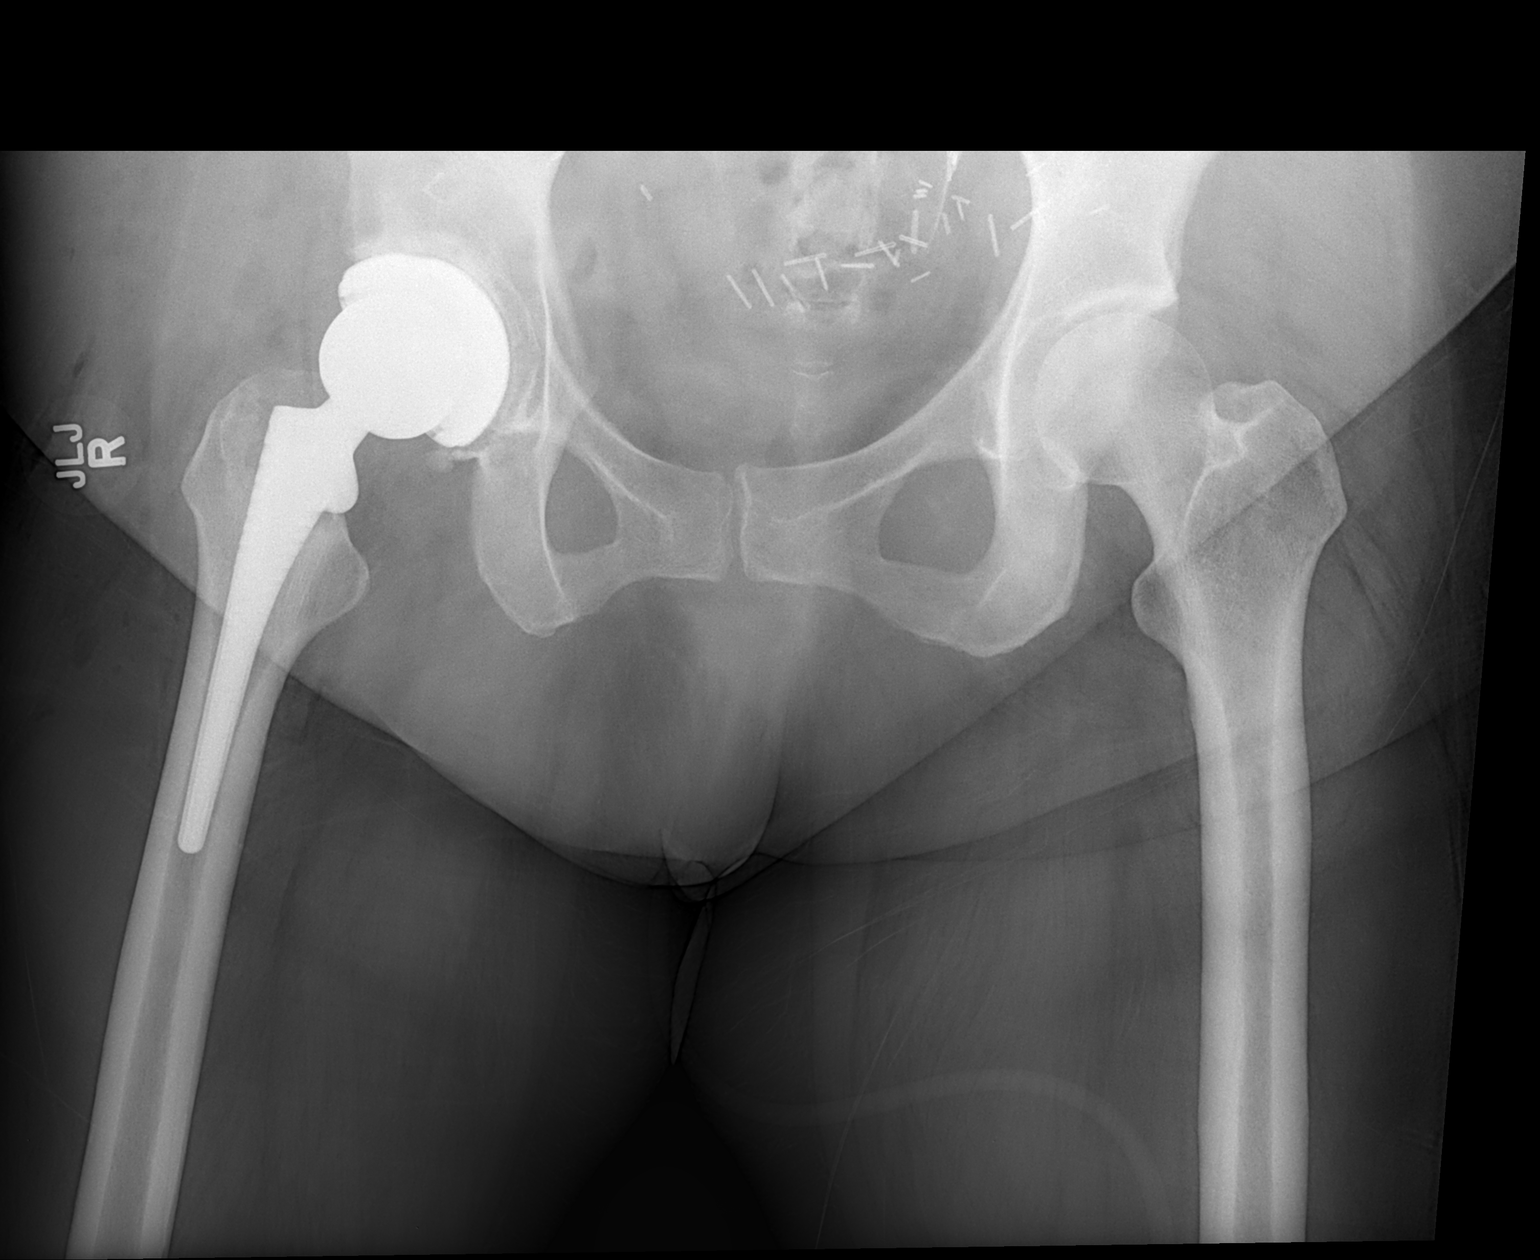

[1 of 1 positions shown; findings below may reference images not displayed]

FINDINGS: Portable single view pelvis is centered low to include
the entire right hip hardware.  Components appear aligned in the
frontal plane.  The patient is status post right hip bipolar
arthroplasty.  No acute osseous finding.  Left hip unremarkable.
Postop changes in the pelvis.
IMPRESSION: Expected postoperative appearance status post right hip
arthroplasty.

## 2012-06-16 ENCOUNTER — Ambulatory Visit (INDEPENDENT_AMBULATORY_CARE_PROVIDER_SITE_OTHER): Payer: 59 | Admitting: Family Medicine

## 2012-06-16 ENCOUNTER — Encounter: Payer: Self-pay | Admitting: Family Medicine

## 2012-06-16 ENCOUNTER — Telehealth: Payer: Self-pay | Admitting: Family Medicine

## 2012-06-16 VITALS — BP 122/70 | HR 115 | Temp 98.8°F | Wt 247.0 lb

## 2012-06-16 DIAGNOSIS — J329 Chronic sinusitis, unspecified: Secondary | ICD-10-CM

## 2012-06-16 DIAGNOSIS — E119 Type 2 diabetes mellitus without complications: Secondary | ICD-10-CM

## 2012-06-16 MED ORDER — ATORVASTATIN CALCIUM 40 MG PO TABS: 40.0000 mg | ORAL_TABLET | Freq: Every day | ORAL | Status: DC

## 2012-06-16 MED ORDER — AZITHROMYCIN 250 MG PO TABS: ORAL_TABLET | ORAL | Status: AC

## 2012-06-16 NOTE — Telephone Encounter (Signed)
Refill request for Atorvastatin 40 mg and a 90 day supply to [facility]. I did send e-scribe.

## 2012-06-16 NOTE — Progress Notes (Signed)
  Subjective:    Patient ID: Morgan [name], female    DOB: 07-Apr-1966, 46 y.o.   MRN: [id number]  HPI Here for 5 days of sinus pressure, PND, ST, and a dry cough. No fever.    Review of Systems  Constitutional: Negative.   HENT: Positive for congestion, postnasal drip and sinus pressure.   Eyes: Negative.   Respiratory: Positive for cough.        Objective:   Physical Exam  Constitutional: She appears well-developed and well-nourished.  HENT:  Right Ear: External ear normal.  Left Ear: External ear normal.  Nose: Nose normal.  Mouth/Throat: Oropharynx is clear and moist.  Eyes: Conjunctivae normal are normal.  Pulmonary/Chest: Effort normal and breath sounds normal.  Lymphadenopathy:    She has no cervical adenopathy.          Assessment & Plan:  Add Mucinex

## 2012-06-18 ENCOUNTER — Telehealth: Payer: Self-pay | Admitting: Family Medicine

## 2012-06-18 ENCOUNTER — Other Ambulatory Visit (INDEPENDENT_AMBULATORY_CARE_PROVIDER_SITE_OTHER): Payer: 59

## 2012-06-18 DIAGNOSIS — E119 Type 2 diabetes mellitus without complications: Secondary | ICD-10-CM

## 2012-06-18 LAB — BASIC METABOLIC PANEL
BUN: 16 mg/dL (ref 6–23)
CO2: 25 mEq/L (ref 19–32)
Calcium: 9.4 mg/dL (ref 8.4–10.5)
Chloride: 103 mEq/L (ref 96–112)
Creatinine, Ser: 0.8 mg/dL (ref 0.4–1.2)
GFR: 81.13 mL/min (ref >60.00)
Glucose, Bld: 201 mg/dL — ABNORMAL HIGH (ref 70–99)
Potassium: 3.4 mEq/L — ABNORMAL LOW (ref 3.5–5.1)
Sodium: 139 mEq/L (ref 135–145)

## 2012-06-18 LAB — LIPID PANEL
Cholesterol: 167 mg/dL (ref 0–200)
HDL: 35.8 mg/dL — ABNORMAL LOW (ref >39.00)
Total CHOL/HDL Ratio: 5
Triglycerides: 275 mg/dL — ABNORMAL HIGH (ref 0.0–149.0)
VLDL: 55 mg/dL — ABNORMAL HIGH (ref 0.0–40.0)

## 2012-06-18 LAB — HEPATIC FUNCTION PANEL
ALT: 48 U/L — ABNORMAL HIGH (ref 0–35)
AST: 37 U/L (ref 0–37)
Albumin: 3.8 g/dL (ref 3.5–5.2)
Alkaline Phosphatase: 111 U/L (ref 39–117)
Bilirubin, Direct: 0.1 mg/dL (ref 0.0–0.3)
Total Bilirubin: 1 mg/dL (ref 0.3–1.2)
Total Protein: 7.6 g/dL (ref 6.0–8.3)

## 2012-06-18 LAB — CBC WITH DIFFERENTIAL/PLATELET
Basophils Absolute: 0 10*3/uL (ref 0.0–0.1)
Basophils Relative: 0.1 % (ref 0.0–3.0)
Eosinophils Absolute: 0.1 10*3/uL (ref 0.0–0.7)
Eosinophils Relative: 1.3 % (ref 0.0–5.0)
HCT: 40.1 % (ref 36.0–46.0)
Hemoglobin: 13.7 g/dL (ref 12.0–15.0)
Lymphocytes Relative: 29.1 % (ref 12.0–46.0)
Lymphs Abs: 2.2 10*3/uL (ref 0.7–4.0)
MCHC: 34.1 g/dL (ref 30.0–36.0)
MCV: 85.3 fl (ref 78.0–100.0)
Monocytes Absolute: 0.3 10*3/uL (ref 0.1–1.0)
Monocytes Relative: 4.4 % (ref 3.0–12.0)
Neutro Abs: 4.9 10*3/uL (ref 1.4–7.7)
Neutrophils Relative %: 65.1 % (ref 43.0–77.0)
Platelets: 356 10*3/uL (ref 150.0–400.0)
RBC: 4.7 Mil/uL (ref 3.87–5.11)
RDW: 14.8 % — ABNORMAL HIGH (ref 11.5–14.6)
WBC: 7.5 10*3/uL (ref 4.5–10.5)

## 2012-06-18 LAB — HEMOGLOBIN A1C: Hgb A1c MFr Bld: 8.7 % — ABNORMAL HIGH (ref 4.6–6.5)

## 2012-06-18 LAB — LDL CHOLESTEROL, DIRECT: Direct LDL: 105.4 mg/dL

## 2012-06-18 LAB — TSH: TSH: 0.81 u[IU]/mL (ref 0.35–5.50)

## 2012-06-18 NOTE — Telephone Encounter (Signed)
[facility] Triage Call Report Triage Record Num: [id number] Operator: Remonia Richter Patient Name: Morgan [name] Call Date & Time: 06/17/2012 8:53:34PM Patient Phone: [phone] PCP: Tera Mater. Fry Patient Gender: Female PCP Fax : [phone] Patient DOB: [birth year] [facility] Name[facility] Reason for Call: Caller: Morgan [name]/Patient; PCP: Gershon Crane (Family Practice); CB#: [phone]; Call regarding Was seen for sinus infection in office 06/16/12, given Azithromycin, - now has a clogged right ear; afebrile,denies pain, or emergent symptoms per Ear symptom Guideline, Home Care Advice given as she has symptoms uf URI and being treated for same, callback perimeters gone over Protocol(s) Used: Ear: Symptoms Recommended Outcome per Protocol: Provide Home/Self Care Reason for Outcome: Ear fullness/pressure along with symptoms of a cold/upper respiratory infection or diagnosed seasonal allergies Care Advice: ~ Call provider if symptoms do not respond to home care or symptoms interfere with sleep or normal activities. ~ Do not use eardrops unless directed by a healthcare provider, especially if having ear drainage. ~ SYMPTOM / CONDITION MANAGEMENT ~ CAUTIONS Consider nonprescription decongestant (Sudafed, Drixoral) for relief of symptoms after checking with a provider, especially if there is a history of hypertension, hyperthyroidism, heart disease, diabetes, glaucoma, urinary retention caused by prostatic hypertrophy. ~ 12/

## 2012-06-22 ENCOUNTER — Telehealth: Payer: Self-pay | Admitting: Family Medicine

## 2012-06-22 NOTE — Telephone Encounter (Signed)
No follow up required, closing encounter. °

## 2012-06-22 NOTE — Telephone Encounter (Signed)
Patient Information:  Caller Name: Vivica  Phone: [phone]  Patient: Ralene Ok  Gender: Female  DOB: 01-16-1966  Age: 46 Years  PCP: G[facility]hon [facility])  Pregnant: No  Office Follow Up:  Does the office need to follow up with this patient?: No  Instructions For The Office: N/A   Symptoms  Reason For Call & Symptoms: Started 12/15 with sneezing, coughing a lot.  Voice hoaseness. Seen in Office Tues 12/17 - just finished ZPak and feeling better except clogged ear, some pressure and hearing echo but no pain.  Reviewed Health History In EMR: Yes  Reviewed Medications In EMR: Yes  Reviewed Allergies In EMR: Yes  Reviewed Surgeries / Procedures: Yes  Date of Onset of Symptoms: 06/13/2012  Treatments Tried: Nettie pot. finished ZPak  Treatments Tried Worked: Yes OB / GYN:  LMP: Unknown  Guideline(s) Used:  Ear - Congestion  Disposition Per Guideline:   See Within 3 Days in Office  Reason For Disposition Reached:   Ear congestion present > 48 hours  Advice Given:  Reassurance:  Definition: Ear congestion is the medical term used to describe symptoms of a stuffy, full, or plugged sensation in the ear. People also sometimes describe crackling or popping noises in the ear. Sometimes hearing seems slightly muffled.  Treatment - Chewing and Swallowing:   Try chewing gum.  You can also try swallowing water while pinching your nostrils closed. The reason this works is that it creates a small vacuum in the nose. This helps the eustachian tube to open up.  Call Back If:   Ear congestion lasts over 48 hours  Ear pain or fever occurs  You become worse.  Plans OV on Thurs 12/26 if not better

## 2012-06-25 ENCOUNTER — Telehealth: Payer: Self-pay | Admitting: Family Medicine

## 2012-06-25 MED ORDER — POTASSIUM CHLORIDE ER 10 MEQ PO TBCR: 10.0000 meq | EXTENDED_RELEASE_TABLET | Freq: Every day | ORAL | Status: DC

## 2012-06-25 MED ORDER — METFORMIN HCL 1000 MG PO TABS: 1000.0000 mg | ORAL_TABLET | Freq: Two times a day (BID) | ORAL | Status: DC

## 2012-06-25 NOTE — Progress Notes (Signed)
Quick Note:  I spoke with pt and sent both scripts e-scribe. ______

## 2012-06-25 NOTE — Telephone Encounter (Signed)
I spoke pt and went over lab results and sent both scripts e-scribe.

## 2012-06-28 ENCOUNTER — Telehealth: Payer: Self-pay | Admitting: Family Medicine

## 2012-06-28 NOTE — Telephone Encounter (Signed)
I spoke with pt and she is going to take 1/2 tablet in am and 1/2 tablet in the pm. She is aware that you are out of the office until 07/05/12 and she will try the decrease for now.

## 2012-06-28 NOTE — Telephone Encounter (Signed)
Caller: Hajira/Patient; Phone: [phone]; Reason for Call: Patient diabetes; states taking 2000 metformin, because A1C was elevated to 8.  7 on 06/18/12.  States she started taking 1000mg  metformin BID, but it is making her sick.  States she has constant nausea beginning 2 hours after taking medication, and has diarrhea as well.  Wants to stop the metformin.  Declines new triage; info to office for provider review/Rx change/callback.  May reach patient at [phone].

## 2012-06-28 NOTE — Telephone Encounter (Signed)
I spoke with pt and gave lab results.  

## 2012-07-02 MED ORDER — GLIPIZIDE 10 MG PO TABS: 10.0000 mg | ORAL_TABLET | Freq: Two times a day (BID) | ORAL | Status: DC

## 2012-07-02 NOTE — Telephone Encounter (Signed)
Patient called back and states she was confused.  Explained medication to patient th roughly and pt states she understands. Pt is aware rx was sent to pharmacy.

## 2012-07-02 NOTE — Telephone Encounter (Signed)
Left a detailed message for pt at cell phone number.  Rx sent to pharmacy.

## 2012-07-02 NOTE — Telephone Encounter (Signed)
I agree with going back on 500 mg of Metformin bid (or 1/2 of a tablet). We will add Glipizide to this. Call in 10 mg to take bid, #[phone] with 11 rf

## 2012-07-02 NOTE — Addendum Note (Signed)
Addended by: Azucena Freed on: 07/02/2012 02:56 PM   Modules accepted: Orders

## 2012-07-05 ENCOUNTER — Telehealth: Payer: Self-pay | Admitting: *Deleted

## 2012-07-05 NOTE — Telephone Encounter (Signed)
Patient request printout of diet for DM and Lipids.  Ready for pick up and patient is aware.

## 2012-07-07 ENCOUNTER — Telehealth: Payer: Self-pay | Admitting: Family Medicine

## 2012-07-07 NOTE — Telephone Encounter (Signed)
Refill request for Metoprolol Tartrate 25 mg, Losartan/HCTZ 50-12.5 mg, Glipizide 10 mg, Omeprazole 40 mg and a 90 day supply.

## 2012-07-09 MED ORDER — METOPROLOL TARTRATE 25 MG PO TABS: 25.0000 mg | ORAL_TABLET | Freq: Two times a day (BID) | ORAL | Status: DC

## 2012-07-09 MED ORDER — GLIPIZIDE 10 MG PO TABS: 10.0000 mg | ORAL_TABLET | Freq: Two times a day (BID) | ORAL | Status: DC

## 2012-07-09 MED ORDER — LOSARTAN POTASSIUM-HCTZ 50-12.5 MG PO TABS: 1.0000 | ORAL_TABLET | Freq: Every day | ORAL | Status: DC

## 2012-07-09 MED ORDER — OMEPRAZOLE 40 MG PO CPDR: 40.0000 mg | DELAYED_RELEASE_CAPSULE | Freq: Every day | ORAL | Status: DC

## 2012-07-09 NOTE — Telephone Encounter (Signed)
I sent all script e-scribe

## 2012-07-13 ENCOUNTER — Telehealth: Payer: Self-pay | Admitting: Family Medicine

## 2012-07-13 NOTE — Telephone Encounter (Signed)
Pt requesting rx for FREESTYLE LITE TEST STRIPS for meter. Cannot afford to get OTC and pay cash. Pharm used: [employer] [facility].

## 2012-07-14 MED ORDER — GLUCOSE BLOOD VI STRP: ORAL_STRIP | Status: DC

## 2012-07-14 NOTE — Telephone Encounter (Signed)
I spoke with pt and sent script e-scribe. 

## 2012-07-14 NOTE — Telephone Encounter (Signed)
Pt following up on test strips.Free Style Lite.  Wanting to know if she can get script for this. Pt is at work and cannot use the phone, but pt would like for you to leave message.

## 2012-07-16 ENCOUNTER — Encounter: Payer: Self-pay | Admitting: Family Medicine

## 2012-07-16 ENCOUNTER — Ambulatory Visit (INDEPENDENT_AMBULATORY_CARE_PROVIDER_SITE_OTHER): Payer: 59 | Admitting: Family Medicine

## 2012-07-16 VITALS — BP 120/84 | HR 90 | Temp 98.0°F | Wt 246.0 lb

## 2012-07-16 DIAGNOSIS — R05 Cough: Secondary | ICD-10-CM

## 2012-07-16 DIAGNOSIS — R058 Other specified cough: Secondary | ICD-10-CM

## 2012-07-16 DIAGNOSIS — R059 Cough, unspecified: Secondary | ICD-10-CM

## 2012-07-16 DIAGNOSIS — K219 Gastro-esophageal reflux disease without esophagitis: Secondary | ICD-10-CM

## 2012-07-16 MED ORDER — FLUTICASONE PROPIONATE 50 MCG/ACT NA SUSP: 2.0000 | Freq: Every day | NASAL | Status: DC

## 2012-07-16 NOTE — Patient Instructions (Signed)
Avoid mentholated cough drops and mint products Avoid caffeine as much as possible (eg Mountain Dew) Continue Prilosec. Avoid eating within 3 hours of bedtime Consider elevate head of bed 6-8 inches. Get back on Flonase nasal.

## 2012-07-16 NOTE — Progress Notes (Signed)
  Subjective:    Patient ID: Morgan [name], female    DOB: Jan 10, 1966, 47 y.o.   MRN: [id number]  HPI Recurrent cough. Patient nonsmoker. Extensive workup and education regarding issues of chronic cough previously per pulmonary. Current cough started about one week ago. No history of asthma and negative methacholine challenge test Current cough is dry. No dyspnea. No wheezing. No obvious postnasal drip. No ACE inhibitor use. Symptoms possibly worse at work. She does clear her throat frequently. Remains on omeprazole 40 mg daily. Was doing a good job controlling issues with reflux but recently lax with diet.  Also frequently uses cough drops, some of which are mentholated.  Denies: Weight loss, fever, appetite change, hemoptysis, pleuritic pain, dyspnea  Recently quit several measures that may have reduced her initial chronic cough  Past Medical History  Diagnosis Date  . Diabetes mellitus   . Hyperlipidemia   . Hypertension   . Ovarian cancer   . Arthritis   . GERD (gastroesophageal reflux disease)    Past Surgical History  Procedure Date  . Melanoma excision 1997  . Abdominal hysterectomy 1986    BSO  . Hemorrhoid surgery   . Hip fracture surgery     reports that she has never smoked. She has never used smokeless tobacco. She reports that she does not drink alcohol or use illicit drugs. family history includes Arthritis in an unspecified family member; Breast cancer in an unspecified family member; Colon cancer in an unspecified family member; Coronary artery disease in an unspecified family member; Diabetes in an unspecified family member; and Prostate cancer in an unspecified family member. Allergies  Allergen Reactions  . Lisinopril Cough      Review of Systems  Constitutional: Negative for fever and chills.  HENT: Negative for sore throat and voice change.   Respiratory: Positive for cough. Negative for shortness of breath and wheezing.   Cardiovascular:  Negative for chest pain.  Neurological: Negative for headaches.       Objective:   Physical Exam  Constitutional: She appears well-developed and well-nourished. No distress.  HENT:  Right Ear: External ear normal.  Left Ear: External ear normal.  Mouth/Throat: Oropharynx is clear and moist.  Neck: Neck supple.  Cardiovascular: Normal rate and regular rhythm.   Pulmonary/Chest: Effort normal and breath sounds normal. No respiratory distress. She has no wheezes. She has no rales.  Lymphadenopathy:    She has no cervical adenopathy.          Assessment & Plan:  Recurrent dry cough. No evidence for wheezing or reactive airway component. Question GERD related. Question acute viral bronchitis. Question allergic postnasal drip. No indication for antibiotics at this time. Avoid mentholated cough drops. Consider elevate head of bed 6-8 inches. Avoid eating within 3 hours of bedtime. Reduce caffeine consumption. Get back on Flonase 2 sprays per nostril once daily

## 2012-07-19 ENCOUNTER — Telehealth: Payer: Self-pay | Admitting: Family Medicine

## 2012-07-19 ENCOUNTER — Encounter: Payer: Self-pay | Admitting: Family Medicine

## 2012-07-19 ENCOUNTER — Ambulatory Visit (INDEPENDENT_AMBULATORY_CARE_PROVIDER_SITE_OTHER): Payer: 59 | Admitting: Family Medicine

## 2012-07-19 VITALS — BP 124/82 | HR 105 | Temp 98.5°F | Wt 242.0 lb

## 2012-07-19 DIAGNOSIS — J209 Acute bronchitis, unspecified: Secondary | ICD-10-CM

## 2012-07-19 MED ORDER — BUDESONIDE-FORMOTEROL FUMARATE 160-4.5 MCG/ACT IN AERO: 2.0000 | INHALATION_SPRAY | Freq: Two times a day (BID) | RESPIRATORY_TRACT | Status: DC

## 2012-07-19 MED ORDER — CLARITHROMYCIN 500 MG PO TABS: 500.0000 mg | ORAL_TABLET | Freq: Two times a day (BID) | ORAL | Status: DC

## 2012-07-19 NOTE — Progress Notes (Signed)
  Subjective:    Patient ID: Morgan [name], female    DOB: [birth year], 47 y.o.   MRN: [id number]  HPI Here for coughing and SOb that started about a week ago. The cough is deep in the chest and she produces some green sputum. No fever. She is using [employer] with no benefit. She has tested negative for asthma, but I have long suspected she may have some underlying bronchospasm issues. She often wheezes when she coughs. She notes her mother has asthma.    Review of Systems  Constitutional: Negative.   HENT: Negative.   Eyes: Negative.   Respiratory: Positive for cough, chest tightness, shortness of breath and wheezing.        Objective:   Physical Exam  Constitutional: She appears well-developed and well-nourished.  HENT:  Right Ear: External ear normal.  Left Ear: External ear normal.  Nose: Nose normal.  Mouth/Throat: Oropharynx is clear and moist.  Eyes: Conjunctivae normal are normal.  Pulmonary/Chest: Effort normal. No respiratory distress. She has no rales.       Scattered wheezes and rhonchi  Lymphadenopathy:    She has no cervical adenopathy.          Assessment & Plan:  Bronchitis with some bronchospasm. Treat with Biaxin. I gave her some samples to try Symbicort bid for a few weeks as an experiment, and she will let me know if this helps

## 2012-07-19 NOTE — Telephone Encounter (Signed)
[facility] Triage Call Report Triage Record Num: [id number] Operator: Alphonsa Overall Patient Name: Morgan [name] Call Date & Time: 07/17/2012 1:00:06AM Patient Phone: [phone] PCP: Tera Mater. Clent Ridges Patient Gender: Female PCP Fax : [phone] Patient DOB: 02/03/1966 [facility] Name[facility] Reason for Call: Hysterectomy history. Jennyfer calling about numbness in left hand. Onset 07/14/12. Burning, swelling, no color change. Not able to take glucose-without strips. Left side of lip drooping. Numbness/tingling in face, hand. Pt aware needs 911 evaluation. Pt declines will call friend to take her. Protocol(s) Used: Numbness or Tingling Recommended Outcome per Protocol: Activate EMS 911 Reason for Outcome: New numbness/tingling associated with weakness or paralysis (unable to move) involving face, arm or leg, especially on same side of body, or loss of coordination (purposeful action) occurring now or within last 2 hours. Care Advice: ~ Protect the patient from falling or other harm. ~ Do not give the patient anything to eat or drink. ~ An adult should stay with the patient, preferably one trained in CPR. ~ IMMEDIATE ACTION Write down provider's name. List or place the following in a bag for transport with the patient: current prescription and/or nonprescription medications; alternative treatments, therapies and medications; and street drugs. ~ 01/18/

## 2012-07-23 ENCOUNTER — Telehealth: Payer: Self-pay | Admitting: Family Medicine

## 2012-07-23 ENCOUNTER — Emergency Department (HOSPITAL_BASED_OUTPATIENT_CLINIC_OR_DEPARTMENT_OTHER): Payer: 59

## 2012-07-23 ENCOUNTER — Encounter: Payer: Self-pay | Admitting: Family Medicine

## 2012-07-23 ENCOUNTER — Encounter (HOSPITAL_BASED_OUTPATIENT_CLINIC_OR_DEPARTMENT_OTHER): Payer: Self-pay | Admitting: *Deleted

## 2012-07-23 ENCOUNTER — Emergency Department (HOSPITAL_BASED_OUTPATIENT_CLINIC_OR_DEPARTMENT_OTHER)
Admission: EM | Admit: 2012-07-23 | Discharge: 2012-07-23 | Disposition: A | Discharge status: Home / Self Care | Payer: 59 | Attending: Emergency Medicine | Admitting: Emergency Medicine

## 2012-07-23 DIAGNOSIS — K219 Gastro-esophageal reflux disease without esophagitis: Secondary | ICD-10-CM | POA: Insufficient documentation

## 2012-07-23 DIAGNOSIS — I10 Essential (primary) hypertension: Secondary | ICD-10-CM | POA: Insufficient documentation

## 2012-07-23 DIAGNOSIS — R05 Cough: Secondary | ICD-10-CM | POA: Insufficient documentation

## 2012-07-23 DIAGNOSIS — Z8739 Personal history of other diseases of the musculoskeletal system and connective tissue: Secondary | ICD-10-CM | POA: Insufficient documentation

## 2012-07-23 DIAGNOSIS — R059 Cough, unspecified: Secondary | ICD-10-CM

## 2012-07-23 DIAGNOSIS — J3489 Other specified disorders of nose and nasal sinuses: Secondary | ICD-10-CM | POA: Insufficient documentation

## 2012-07-23 DIAGNOSIS — Z8543 Personal history of malignant neoplasm of ovary: Secondary | ICD-10-CM | POA: Insufficient documentation

## 2012-07-23 DIAGNOSIS — Z79899 Other long term (current) drug therapy: Secondary | ICD-10-CM | POA: Insufficient documentation

## 2012-07-23 DIAGNOSIS — E785 Hyperlipidemia, unspecified: Secondary | ICD-10-CM | POA: Insufficient documentation

## 2012-07-23 DIAGNOSIS — E119 Type 2 diabetes mellitus without complications: Secondary | ICD-10-CM | POA: Insufficient documentation

## 2012-07-23 DIAGNOSIS — R0982 Postnasal drip: Secondary | ICD-10-CM | POA: Insufficient documentation

## 2012-07-23 MED ORDER — ALBUTEROL SULFATE HFA 108 (90 BASE) MCG/ACT IN AERS
2.0000 | INHALATION_SPRAY | Freq: Once | RESPIRATORY_TRACT | Status: AC
  Administered 2012-07-23: 2 via RESPIRATORY_TRACT
  Filled 2012-07-23: qty 6.7

## 2012-07-23 MED ORDER — PREDNISONE 20 MG PO TABS
40.0000 mg | ORAL_TABLET | Freq: Once | ORAL | Status: AC
Start: 2012-07-23 — End: 2012-07-23
  Administered 2012-07-23: 40 mg via ORAL
  Filled 2012-07-23: qty 2

## 2012-07-23 MED ORDER — PREDNISONE 10 MG PO TABS: 20.0000 mg | ORAL_TABLET | Freq: Every day | ORAL | Status: DC

## 2012-07-23 MED ORDER — BENZONATATE 100 MG PO CAPS: 100.0000 mg | ORAL_CAPSULE | Freq: Three times a day (TID) | ORAL | Status: DC

## 2012-07-23 MED ORDER — BENZONATATE 100 MG PO CAPS
200.0000 mg | ORAL_CAPSULE | ORAL | Status: AC
  Administered 2012-07-23: 200 mg via ORAL
  Filled 2012-07-23: qty 2

## 2012-07-23 MED ORDER — LEVOFLOXACIN 500 MG PO TABS: 500.0000 mg | ORAL_TABLET | Freq: Every day | ORAL | Status: DC

## 2012-07-23 NOTE — Telephone Encounter (Signed)
[facility] Triage Call Report Triage Record Num: [id number] Operator: Rebeca Allegra Patient Name: Morgan [name] Call Date & Time: 07/21/2012 8:54:41PM Patient Phone: [phone] PCP: Tera Mater. Clent Ridges Patient Gender: Female PCP Fax : [phone] Patient DOB: [birth year] [facility] Name: [facility] Reason for Call: Caller: [name]/Patient; PCP: Gershon Crane [facility]); CB#: [phone]; Call regarding Cough; pt seen in the office 07/19/12, dx'd with Bronchitis with bronchospasms, rx'd Biaxin and Symbicort-started 07/19/12 1600. Pt reports a worsening cough and no relief from the inhaler. Afebrile. All emergent symptoms ruled out per Cough-Adult protocol with the exception of "Gradual onset of cough when lying down and relieved after being in a sitting or standing position". Offered pt an appt for 07/22/12. Pt declined stating she has to work, but will f/u with the office 07/23/12 if symptoms have not improved. Home care advice given. Emergent symptoms reviewed. Protocol(s) Used: Cough - Adult Protocol(s) Used: Upper Respiratory Infection (URI) Recommended Outcome per Protocol: See Provider within 24 hours Reason for Outcome: Cough is the symptom causing most concern Gradual onset of cough when lying down AND relieved after being in a sitting or standing position Care Advice: Go to ED IMMEDIATELY if developing increased shortness of breath, continuous cough, worsening fatigue, or unable to perform ADLs. ~ Call EMS 911 if having chest pain lasting more than 5 minutes, shortness of breath that makes walking and talking very difficult, very fast or irregular heartbeat, breathing hard and fast, or lips or nails a blue/grey color. ~ 01/

## 2012-07-23 NOTE — Patient Instructions (Signed)
Instructed pt on the proper use of administering albuteral mdi via aerochamber. Pt tolerated well 

## 2012-07-23 NOTE — ED Provider Notes (Addendum)
Pt is a 47 y/o female with hx of cough X > 1 week who has been seen by her PMD for same cough and treated with a variety of long acting bronchodilators, inhaled steroids, clarithromycin and has had no relief - she states it gets worse when she lays down and is associated with clear rhinorrhea and nasal drip.  She was tested this last summer for RAD and was found to not have Asthma.  She has had bronchitis this week per the pt.  On exam she has mild end expiratory wheezing but speaking in full sentences and not using accessory muscles.  She does cough frequently on exam especially with deep breathing.  OP clear, nasal passages with clear rhinorrhea but no purulent d/c, no swelling of the tonsils and no asymetry.  Heart sounds are regular without murmur, gait is stable and conjunctivae are clear.  I personally interpreted the 2 view chest xray PA and Lateral views and find there to be clear lungs, normal appearing heart and no subdiaphragmatic air.  She has no signs of focal infiltrate or pneumothorax.  Radiologist has read film to show L sided mild patchy disease, could be c/w pna given pt's presentation, change Biaxin to Levaquin.  Plan to treat for bronchitis / PNA with prednisone, albuterol MDI, tessalon and f/u.  Medical screening examination/treatment/procedure(s) were conducted as a shared visit with non-physician practitioner(s) and myself.  I personally evaluated the patient during the encounter    Vida Roller, MD 07/23/12 [id number]  Vida Roller, MD 07/23/12 [phone]

## 2012-07-23 NOTE — Telephone Encounter (Signed)
Patient Information:  Caller Name: Morgan [name]  Phone: [phone]  Patient: Morgan, [name]  Gender: Female  DOB: 12/17/1965  Age: 47 Years  PCP: Gershon Crane [facility])  Pregnant: No  Office Follow Up:  Does the office need to follow up with this patient?: Yes  Instructions For The Office: Please call and schedule appt. for 1/31  RN Note:  Needs a doctors note for 1/22, 1/23 please.  She has note for 1/24 from the ED visit.  Symptoms  Reason For Call & Symptoms: Patient calling, was seen on 1/21 and dx with bronchitis and started on medications.  She went to the ED last night and was dx with pneumonia.  Her medications were changed to Prednisone ,Ventolin Inhaler with a chamber and Benzonatate, Levofloxacin x 7 days.  Was told to rest and not talk x 3 days.  Needs to f/u next Friday, 1/31.  Please call to schedule.  Reviewed Health History In EMR: N/A  Reviewed Medications In EMR: N/A  Reviewed Allergies In EMR: N/A  Reviewed Surgeries / Procedures: N/A  Date of Onset of Symptoms: 07/20/2012 OB / GYN:  LMP: Unknown  Guideline(s) Used:  No Protocol Available - Sick Adult  Disposition Per Guideline:   See Within 2 Weeks in Office  Reason For Disposition Reached:   Nursing judgment  Advice Given:  N/A  RN Overrode Recommendation:  Make Appointment  Needs appt after completing the 7 day antibiotic course

## 2012-07-23 NOTE — Telephone Encounter (Signed)
Give her a note for 07-21-12 and 07-22-12

## 2012-07-23 NOTE — ED Notes (Signed)
Pt sts she has been coughing x 2 weeks. She sts she has seen her doctor twice in the last week and is not getting any better. Pt coughs until she gags herself and vomits.

## 2012-07-23 NOTE — ED Provider Notes (Signed)
History     CSN: [id number]  Arrival date & time 07/23/12  0224   First MD Initiated Contact with Patient 07/23/12 [phone]      Chief Complaint  Patient presents with  . Cough    (Consider location/radiation/quality/duration/timing/severity/associated sxs/prior treatment) Patient is a 47 y.o. female presenting with cough. The history is provided by the patient. No language interpreter was used.  Cough This is a new problem. The current episode started more than 1 week ago. The problem occurs every few minutes (cough worsens when supine and improves when sitting or standing upright). The problem has not changed since onset.The cough is non-productive. There has been no fever. Associated symptoms include rhinorrhea. Pertinent negatives include no chest pain, no chills, no sweats, no ear congestion, no headaches, no sore throat, no myalgias, no shortness of breath and no wheezing. Associated symptoms comments: Patient admits to congestion and rhinorrhea. Treatments tried: patient saw PCP for cough Tuesday who prescribed symbicort and clarithromycin. Her past medical history does not include COPD or asthma.    Past Medical History  Diagnosis Date  . Diabetes mellitus   . Hyperlipidemia   . Hypertension   . Ovarian cancer   . Arthritis   . GERD (gastroesophageal reflux disease)     Past Surgical History  Procedure Date  . Melanoma excision 1997  . Abdominal hysterectomy 1986    BSO  . Hemorrhoid surgery   . Hip fracture surgery     Family History  Problem Relation Age of Onset  . Arthritis    . Diabetes    . Colon cancer    . Prostate cancer    . Breast cancer    . Coronary artery disease      History  Substance Use Topics  . Smoking status: Never Smoker   . Smokeless tobacco: Never Used  . Alcohol Use: No    OB History    Grav Para Term Preterm Abortions TAB SAB Ect Mult Living                  Review of Systems  Constitutional: Negative for fever, chills and  diaphoresis.  HENT: Positive for congestion, rhinorrhea and postnasal drip. Negative for sore throat.   Eyes: Negative for pain and discharge.  Respiratory: Positive for cough. Negative for shortness of breath and wheezing.   Cardiovascular: Negative for chest pain.  Gastrointestinal: Negative for nausea, vomiting, abdominal pain and diarrhea.  Musculoskeletal: Negative for myalgias.  Neurological: Negative for light-headedness and headaches.    Allergies  Lisinopril  Home Medications   Current Outpatient Rx  Name  Route  Sig  Dispense  Refill  . ATORVASTATIN CALCIUM 40 MG PO TABS   Oral   Take 1 tablet (40 mg total) by mouth daily.   90 tablet   3   . BENZONATATE 100 MG PO CAPS   Oral   Take 1 capsule (100 mg total) by mouth every 8 (eight) hours.   21 capsule   0   . BUDESONIDE-FORMOTEROL FUMARATE 160-4.5 MCG/ACT IN AERO   Inhalation   Inhale 2 puffs into the lungs 2 (two) times daily.   1 Inhaler   0   . CALCIUM CARBONATE ANTACID 500 MG PO CHEW   Oral   Chew 2 tablets by mouth daily as needed. For indigestion           . CLARITHROMYCIN 500 MG PO TABS   Oral   Take 1 tablet (500 mg total) by  mouth 2 (two) times daily.   20 tablet   0   . FLUTICASONE PROPIONATE 50 MCG/ACT NA SUSP   Nasal   Place 2 sprays into the nose daily.   16 g   2   . GLIPIZIDE 10 MG PO TABS   Oral   Take 1 tablet (10 mg total) by mouth 2 (two) times daily before a meal.   180 tablet   1   . GLUCOSE BLOOD VI STRP      Use as instructed   100 each   3     Test once per day and diagnosis code is 250.00   . LEVOFLOXACIN 500 MG PO TABS   Oral   Take 1 tablet (500 mg total) by mouth daily.   7 tablet   0   . LOSARTAN POTASSIUM-HCTZ 50-12.5 MG PO TABS   Oral   Take 1 tablet by mouth daily.   90 tablet   1   . METFORMIN HCL 1000 MG PO TABS   Oral   Take 1 tablet (1,000 mg total) by mouth 2 (two) times daily with a meal.   60 tablet   11   . METOPROLOL TARTRATE 25 MG  PO TABS   Oral   Take 1 tablet (25 mg total) by mouth 2 (two) times daily.   180 tablet   1   . OMEPRAZOLE 40 MG PO CPDR   Oral   Take 1 capsule (40 mg total) by mouth daily.   90 capsule   1   . POTASSIUM CHLORIDE ER 10 MEQ PO TBCR   Oral   Take 1 tablet (10 mEq total) by mouth daily.   30 tablet   11   . PREDNISONE 10 MG PO TABS   Oral   Take 2 tablets (20 mg total) by mouth daily.   10 tablet   0     BP 136/86  Pulse 94  Temp 97.8 F (36.6 C) (Oral)  Resp 20  Wt 242 lb (109.77 kg)  SpO2 97%  Physical Exam  Constitutional: No distress.       Morbidly obese  HENT:  Head: Normocephalic and atraumatic.  Eyes: No scleral icterus.  Cardiovascular: Normal rate, regular rhythm and normal heart sounds.   Pulmonary/Chest: Effort normal and breath sounds normal. She has no wheezes.  Abdominal: Soft. She exhibits no distension.  Lymphadenopathy:    She has no cervical adenopathy.  Neurological: She is alert.  Skin: Skin is warm and dry.  Psychiatric: She has a normal mood and affect. Her behavior is normal.    ED Course  Procedures (including critical care time)  Labs Reviewed - No data to display Dg Chest 2 View  07/23/2012  *RADIOLOGY REPORT*  Clinical Data: Cough and shortness of breath.  CHEST - 2 VIEW  Comparison: Chest radiograph performed 10/31/2011  Findings: The lungs are mildly hypoexpanded.  Mild patchy left- sided airspace opacity raises concern for pneumonia.  No definite pleural effusion or pneumothorax is seen.  The heart is normal in size.  No acute osseous abnormalities are identified.  IMPRESSION: Lungs mildly hypoexpanded; mild patchy left-sided airspace opacity raises concern for pneumonia.   Original Report Authenticated By: Tonia Ghent, M.D.      1. Cough       MDM  Patient presents to ED with CC of cough x 1 week. Pt admits to nasal congestion, rhinorrhea, and states the cough gets worse when supine likely 2/2 postnasal drip;  cough  improves/lessens in frequency when sitting/upright. Denies fever, chills, N/V/D, chest pain, and SOB. Was seen by PCP on Tuesday for cough who diagnosed her with bronchitis and treated with clarithromycin PO and symbicort. Pt states symbicort providing no relief.  Patient received CXR in ED today which showed possibility of a mild patchy left-sided opacity. ? pneumonia. Patient states she is most concerned with being able to sleep as her cough as been keeping her up at night. Will treat patient with prednisone PO, an albuterol inhaler and tessalon. Will reevaluate to see if symptoms improve. In light of CXR findings will also change Abx to levaquin PO QD to provide more broad coverage for ? pneumonia.      Antony Madura, PA-C 07/23/12 [phone]

## 2012-07-23 NOTE — Telephone Encounter (Signed)
Okay to give note 

## 2012-07-23 NOTE — Telephone Encounter (Signed)
[facility] Triage Call Report Triage Record Num: [id number] Operator: Rebeca Allegra Patient Name: Nicolasa Milbrath Call Date & Time: 07/22/2012 2:45:04AM Patient Phone: [phone] PCP: Tera Mater. Clent Ridges Patient Gender: Female PCP Fax : [phone] Patient DOB: [birth year] [facility] Reason for Call: Caller: Erica/Patient; PCP: Gershon Crane (Family Practice); CB#: [phone]; Call regarding Nose Bleed; Pt reports after completed a Neti pot, blood was draining from her nose-07/22/12 0241. Bleeding has now subsided. All emergent symptoms ruled out per Nosebleed protocol, home care advice given. Emergent symptom reviewed. Protocol(s) Used: Nosebleed Recommended Outcome per Protocol: Provide Home/Self Care Reason for Outcome: Single episode of bleeding AND has not applied constant direct pressure for 10 minutes by a clock Care Advice: ~ 01/

## 2012-07-26 ENCOUNTER — Telehealth: Payer: Self-pay | Admitting: Family Medicine

## 2012-07-26 NOTE — Telephone Encounter (Signed)
I left voice message from Dr. Claris Che previous note. If pt feels she can return to work today she can, if not then we can give note for today.

## 2012-07-26 NOTE — Telephone Encounter (Signed)
I did write note and left voice message for pt.

## 2012-07-26 NOTE — Telephone Encounter (Signed)
Please call her to tell her that the CXR in the ER indeed showed a possible pneumonia on the left side. They changed her from Biaxin to Levaquin, which is reasonable. She should finish this out. It is up to her whether she thinks she can return to work or not today.

## 2012-07-30 NOTE — ED Provider Notes (Signed)
Medical screening examination/treatment/procedure(s) were performed by non-physician practitioner and as supervising physician I was immediately available for consultation/collaboration.    Nelia Shi, MD 07/30/12 [phone]

## 2012-08-14 ENCOUNTER — Other Ambulatory Visit: Payer: Self-pay

## 2012-09-09 ENCOUNTER — Other Ambulatory Visit: Payer: Self-pay

## 2012-09-09 DIAGNOSIS — Z1231 Encounter for screening mammogram for malignant neoplasm of breast: Secondary | ICD-10-CM

## 2012-09-16 IMAGING — CR DG ABDOMEN 2V
2 series · 2 of 2 positions shown · non-contrast
Comparison: None

CLINICAL DATA: Sudden onset of abdominal pain.  History of ovarian
cancer.

ABDOMEN - 2 VIEW

[w abdomen upright]
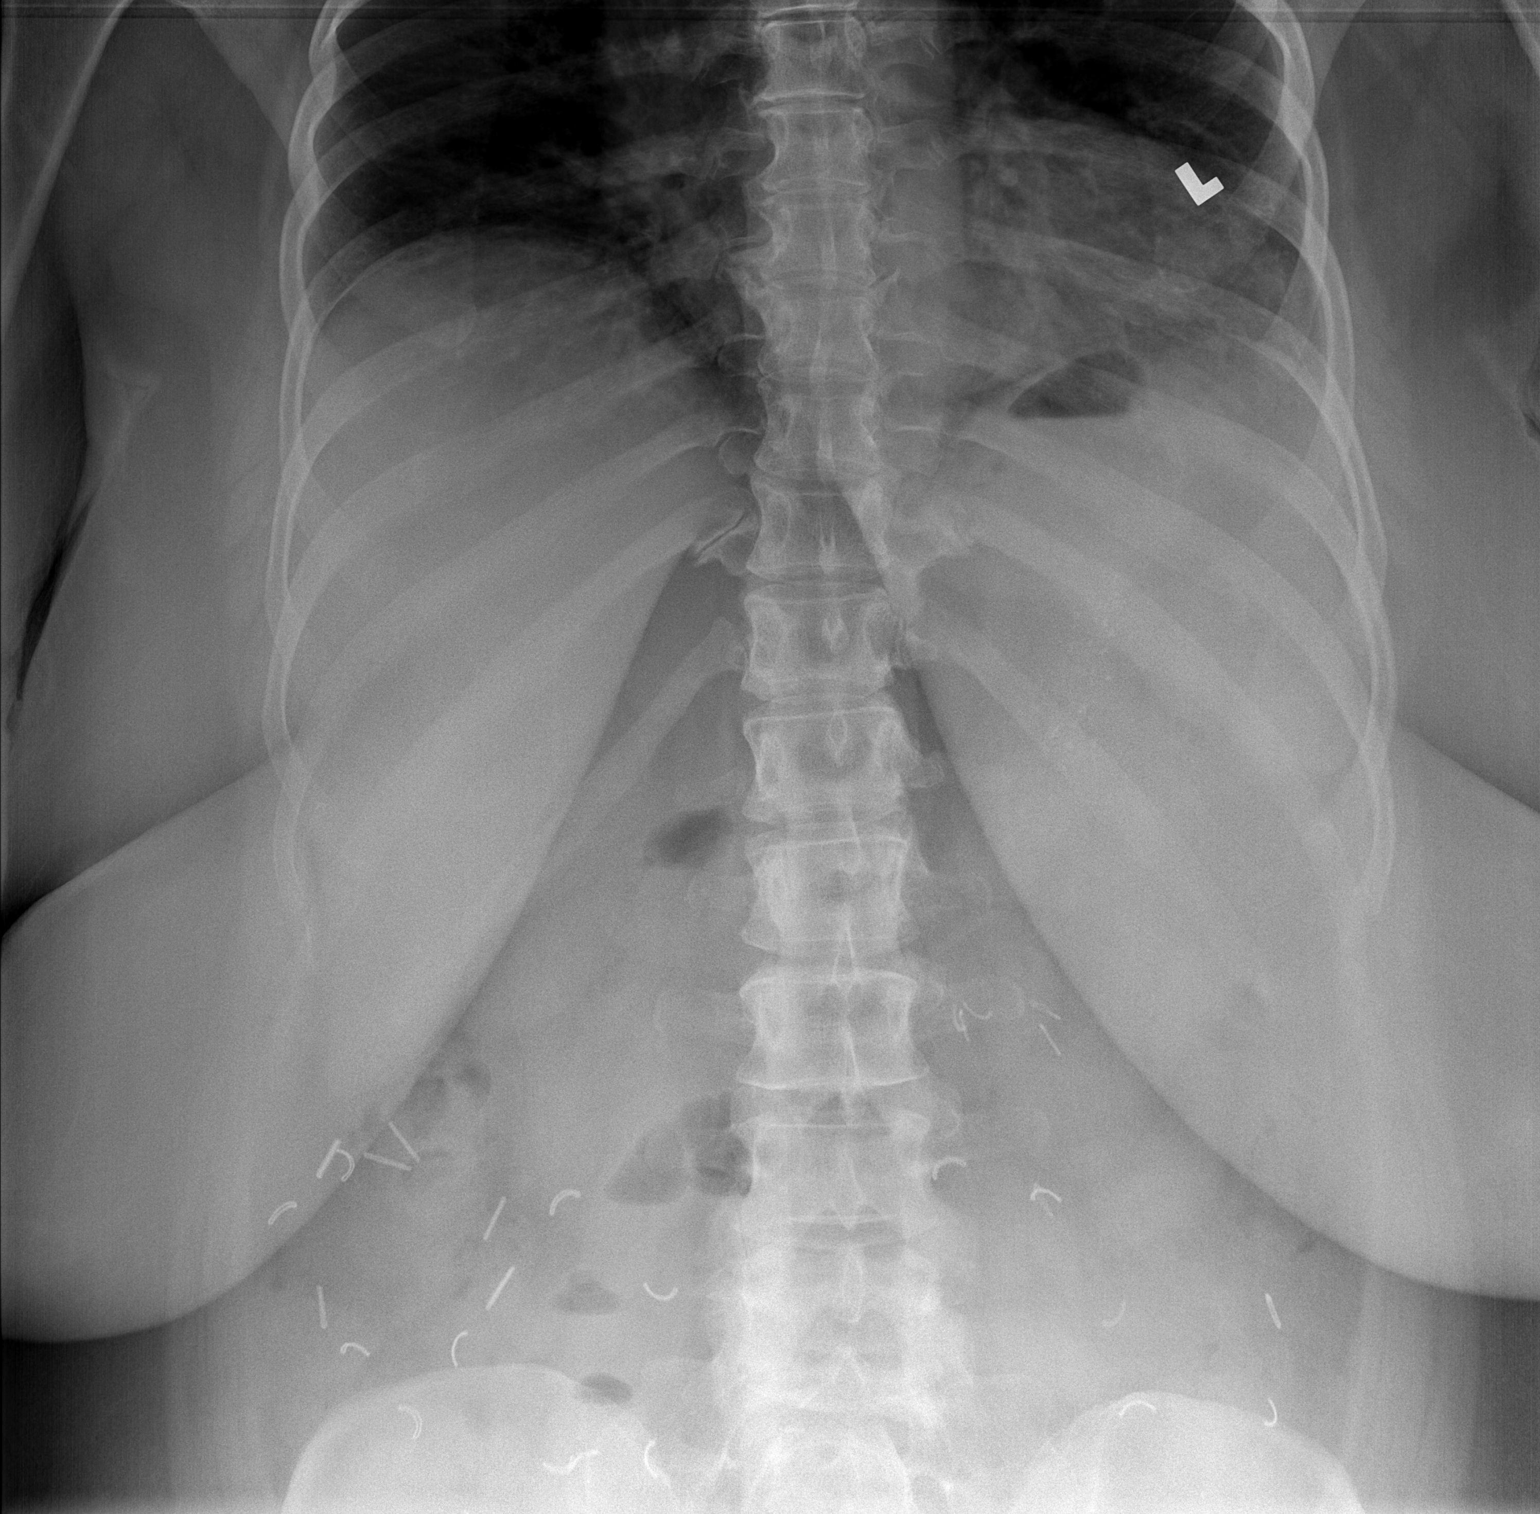

[t abdomen supine]
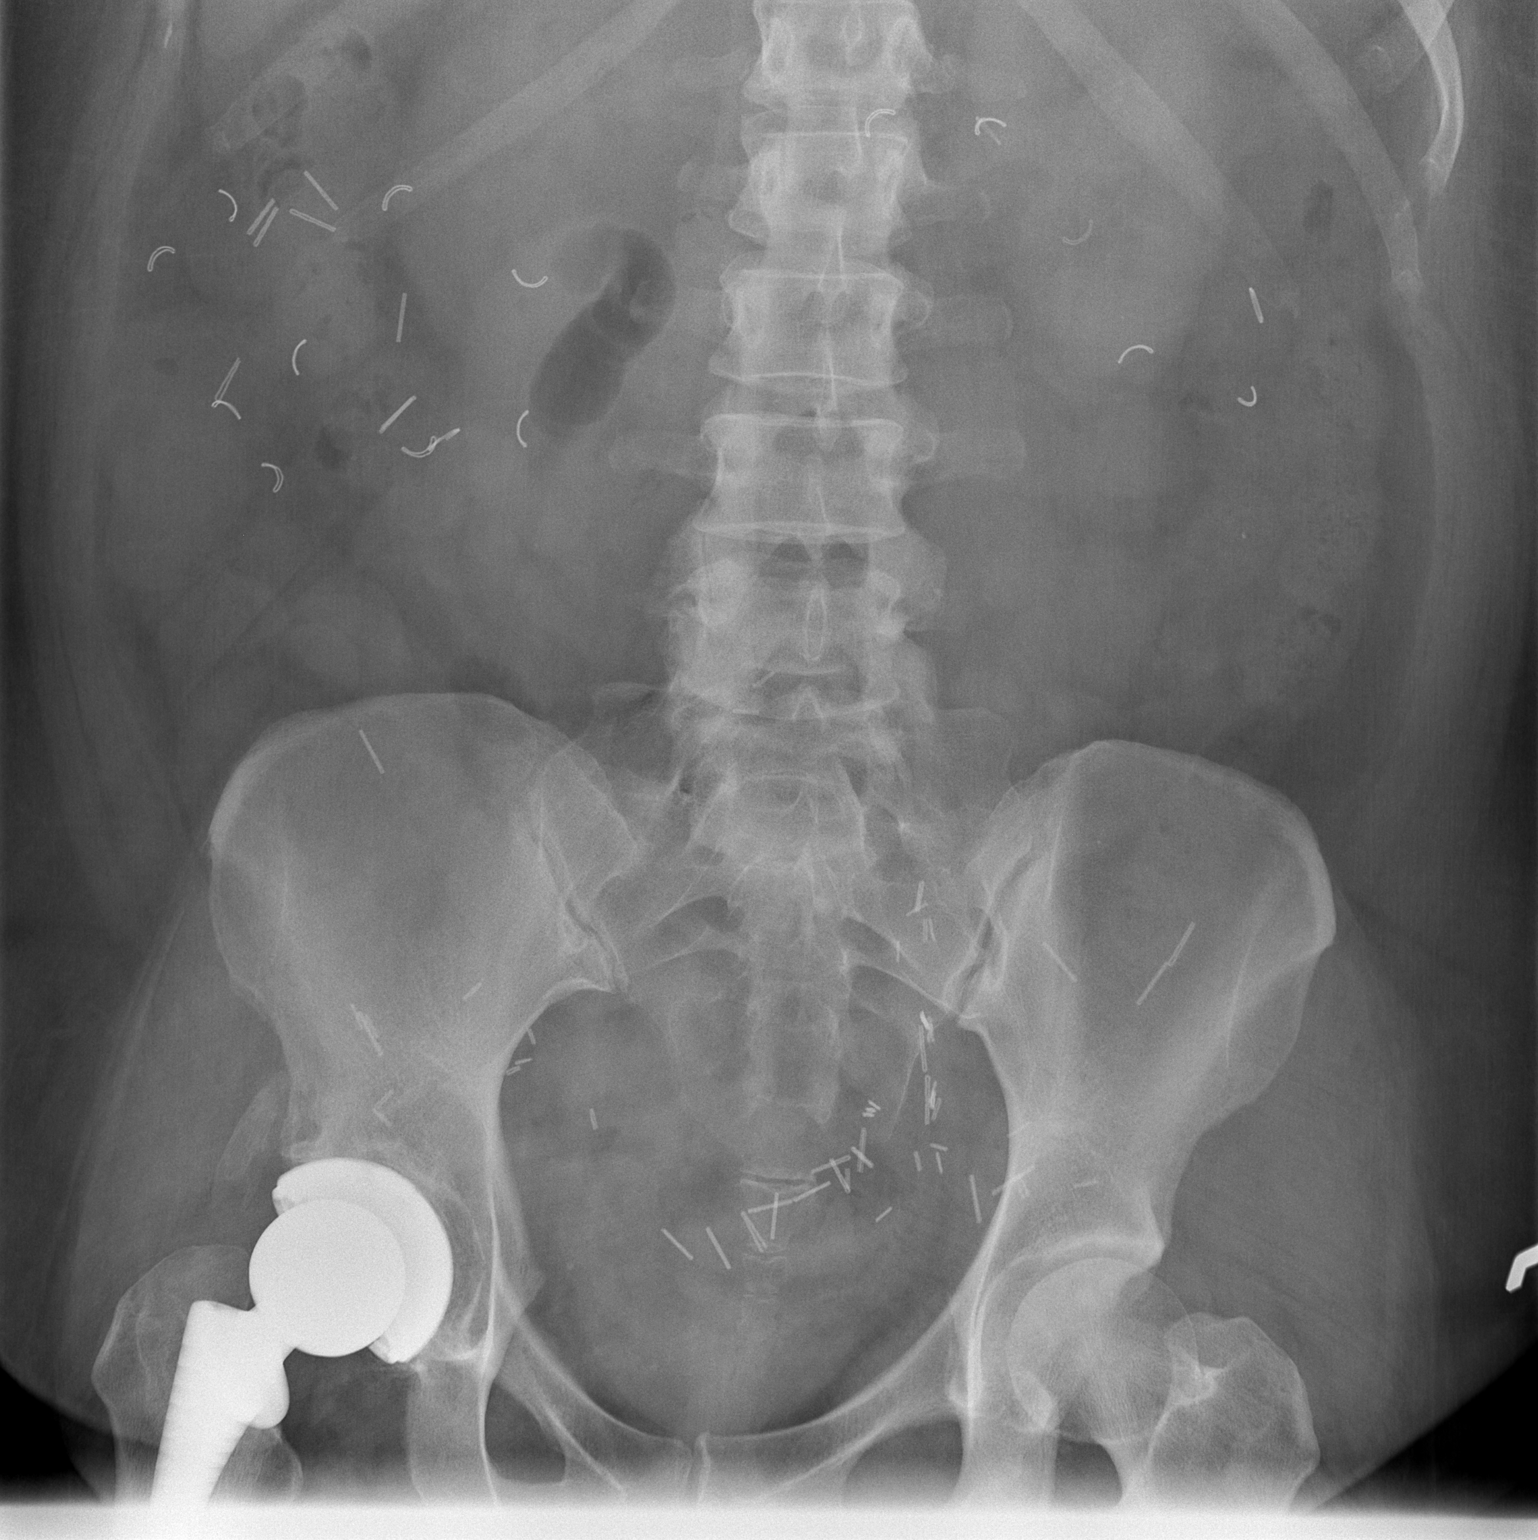

[2 of 2 positions shown; findings below may reference images not displayed]

FINDINGS: Bowel gas pattern is normal without evidence of ileus,
obstruction or free air.  Multiple surgical clips are noted
throughout the abdomen and pelvis.  No acute bony finding.  There
is been previous hip replacement on the right.
IMPRESSION: No acute finding.  Extensive postoperative changes with multiple
surgical clips.  No evidence of ileus, obstruction or free air
however.

## 2012-09-17 ENCOUNTER — Other Ambulatory Visit (INDEPENDENT_AMBULATORY_CARE_PROVIDER_SITE_OTHER): Payer: 59

## 2012-09-17 DIAGNOSIS — I10 Essential (primary) hypertension: Secondary | ICD-10-CM

## 2012-09-17 DIAGNOSIS — IMO0001 Reserved for inherently not codable concepts without codable children: Secondary | ICD-10-CM

## 2012-09-17 LAB — HEMOGLOBIN A1C: Hgb A1c MFr Bld: 6.3 % (ref 4.6–6.5)

## 2012-09-17 LAB — BASIC METABOLIC PANEL
BUN: 22 mg/dL (ref 6–23)
CO2: 30 mEq/L (ref 19–32)
Calcium: 9.3 mg/dL (ref 8.4–10.5)
Chloride: 103 mEq/L (ref 96–112)
Creatinine, Ser: 0.8 mg/dL (ref 0.4–1.2)
GFR: 84.71 mL/min (ref >60.00)
Glucose, Bld: 111 mg/dL — ABNORMAL HIGH (ref 70–99)
Potassium: 3.7 mEq/L (ref 3.5–5.1)
Sodium: 140 mEq/L (ref 135–145)

## 2012-09-21 NOTE — Progress Notes (Signed)
Quick Note:  I released results in my chart. ______ 

## 2012-09-30 ENCOUNTER — Ambulatory Visit
Admission: RE | Admit: 2012-09-30 | Discharge: 2012-09-30 | Disposition: A | Discharge status: Home / Self Care | Payer: 59 | Source: Ambulatory Visit

## 2012-09-30 DIAGNOSIS — Z1231 Encounter for screening mammogram for malignant neoplasm of breast: Secondary | ICD-10-CM

## 2012-10-07 ENCOUNTER — Telehealth: Payer: Self-pay | Admitting: Family Medicine

## 2012-10-07 NOTE — Telephone Encounter (Signed)
Pt called to inquire about mammogram results from 09/30/12. Please assist.

## 2012-10-08 NOTE — Telephone Encounter (Signed)
They always contact you with results. A letter should be on the way

## 2012-10-08 NOTE — Telephone Encounter (Signed)
I left voice message with below information. 

## 2012-10-09 HISTORY — PX: BASAL CELL CARCINOMA EXCISION: SHX1214

## 2012-11-13 ENCOUNTER — Encounter: Payer: Self-pay | Admitting: Family Medicine

## 2012-11-18 ENCOUNTER — Encounter: Payer: Self-pay | Admitting: Family Medicine

## 2012-11-29 ENCOUNTER — Other Ambulatory Visit: Payer: Self-pay | Admitting: Family Medicine

## 2012-12-16 ENCOUNTER — Encounter (INDEPENDENT_AMBULATORY_CARE_PROVIDER_SITE_OTHER): Payer: 59 | Admitting: General Surgery

## 2012-12-19 ENCOUNTER — Other Ambulatory Visit: Payer: Self-pay | Admitting: Family Medicine

## 2013-01-07 ENCOUNTER — Other Ambulatory Visit: Payer: Self-pay | Admitting: Family Medicine

## 2013-02-16 ENCOUNTER — Encounter: Payer: Self-pay | Admitting: Family Medicine

## 2013-02-16 ENCOUNTER — Ambulatory Visit (INDEPENDENT_AMBULATORY_CARE_PROVIDER_SITE_OTHER): Payer: 59 | Admitting: Family Medicine

## 2013-02-16 VITALS — Temp 97.8°F | Resp 14 | Wt 243.0 lb

## 2013-02-16 DIAGNOSIS — E785 Hyperlipidemia, unspecified: Secondary | ICD-10-CM

## 2013-02-16 DIAGNOSIS — I10 Essential (primary) hypertension: Secondary | ICD-10-CM

## 2013-02-16 DIAGNOSIS — F411 Generalized anxiety disorder: Secondary | ICD-10-CM

## 2013-02-16 DIAGNOSIS — E119 Type 2 diabetes mellitus without complications: Secondary | ICD-10-CM

## 2013-02-16 LAB — BASIC METABOLIC PANEL
BUN: 15 mg/dL (ref 6–23)
CO2: 28 mEq/L (ref 19–32)
Calcium: 9.4 mg/dL (ref 8.4–10.5)
Chloride: 103 mEq/L (ref 96–112)
Creatinine, Ser: 0.8 mg/dL (ref 0.4–1.2)
GFR: 77.55 mL/min (ref >60.00)
Glucose, Bld: 105 mg/dL — ABNORMAL HIGH (ref 70–99)
Potassium: 3.2 mEq/L — ABNORMAL LOW (ref 3.5–5.1)
Sodium: 140 mEq/L (ref 135–145)

## 2013-02-16 LAB — CBC WITH DIFFERENTIAL/PLATELET
Basophils Absolute: 0 10*3/uL (ref 0.0–0.1)
Basophils Relative: 0.2 % (ref 0.0–3.0)
Eosinophils Absolute: 0.1 10*3/uL (ref 0.0–0.7)
Eosinophils Relative: 0.7 % (ref 0.0–5.0)
HCT: 38.2 % (ref 36.0–46.0)
Hemoglobin: 13 g/dL (ref 12.0–15.0)
Lymphocytes Relative: 25.9 % (ref 12.0–46.0)
Lymphs Abs: 2.5 10*3/uL (ref 0.7–4.0)
MCHC: 34 g/dL (ref 30.0–36.0)
MCV: 84.3 fl (ref 78.0–100.0)
Monocytes Absolute: 0.4 10*3/uL (ref 0.1–1.0)
Monocytes Relative: 4.5 % (ref 3.0–12.0)
Neutro Abs: 6.5 10*3/uL (ref 1.4–7.7)
Neutrophils Relative %: 68.7 % (ref 43.0–77.0)
Platelets: 387 10*3/uL (ref 150.0–400.0)
RBC: 4.53 Mil/uL (ref 3.87–5.11)
RDW: 15 % — ABNORMAL HIGH (ref 11.5–14.6)
WBC: 9.5 10*3/uL (ref 4.5–10.5)

## 2013-02-16 LAB — HEPATIC FUNCTION PANEL
ALT: 35 U/L (ref 0–35)
AST: 24 U/L (ref 0–37)
Albumin: 3.9 g/dL (ref 3.5–5.2)
Alkaline Phosphatase: 85 U/L (ref 39–117)
Bilirubin, Direct: 0 mg/dL (ref 0.0–0.3)
Total Bilirubin: 0.6 mg/dL (ref 0.3–1.2)
Total Protein: 7.7 g/dL (ref 6.0–8.3)

## 2013-02-16 LAB — LIPID PANEL
Cholesterol: 173 mg/dL (ref 0–200)
HDL: 40.3 mg/dL (ref >39.00)
Total CHOL/HDL Ratio: 4
Triglycerides: 352 mg/dL — ABNORMAL HIGH (ref 0.0–149.0)
VLDL: 70.4 mg/dL — ABNORMAL HIGH (ref 0.0–40.0)

## 2013-02-16 LAB — HEMOGLOBIN A1C: Hgb A1c MFr Bld: 6.3 % (ref 4.6–6.5)

## 2013-02-16 LAB — TSH: TSH: 1.42 u[IU]/mL (ref 0.35–5.50)

## 2013-02-16 LAB — LDL CHOLESTEROL, DIRECT: Direct LDL: 99.1 mg/dL

## 2013-02-16 MED ORDER — SERTRALINE HCL 50 MG PO TABS: 50.0000 mg | ORAL_TABLET | Freq: Every day | ORAL | Status: DC

## 2013-02-16 NOTE — Progress Notes (Signed)
  Subjective:    Patient ID: Morgan [name], female    DOB: 1966-04-13, 47 y.o.   MRN: [id number]  HPI Here to follow up on HTN and diabetes, but also to discuss the stress she is under. In February of this year her brother died unexpectedly and she has struggled with this. Then a month ago she as fired from her job, and the rent also went up in her apartment so is being forced to find a new place to live. She has been anxious, depressed, tearful, and overwhelmed. She had used Zoloft some years ago and did well with this.    Review of Systems  Constitutional: Negative.   Respiratory: Negative.   Cardiovascular: Negative.   Psychiatric/Behavioral: Positive for dysphoric mood. Negative for behavioral problems, confusion, decreased concentration and agitation. The patient is nervous/anxious.        Objective:   Physical Exam  Constitutional: She appears well-developed and well-nourished.  Cardiovascular: Normal rate, regular rhythm, normal heart sounds and intact distal pulses.   Pulmonary/Chest: Effort normal and breath sounds normal.  Psychiatric: Her behavior is normal. Thought content normal.  Tearful           Assessment & Plan:  Get fasting labs today. Start on Zoloft 50 mg daily. I suggested she get into therapy with Behavioral Medicine.

## 2013-02-18 ENCOUNTER — Other Ambulatory Visit: Payer: Self-pay

## 2013-02-18 MED ORDER — POTASSIUM CHLORIDE CRYS ER 20 MEQ PO TBCR: 20.0000 meq | EXTENDED_RELEASE_TABLET | Freq: Every day | ORAL | Status: DC

## 2013-02-22 ENCOUNTER — Ambulatory Visit (INDEPENDENT_AMBULATORY_CARE_PROVIDER_SITE_OTHER): Payer: 59 | Admitting: Licensed Clinical Social Worker

## 2013-02-22 DIAGNOSIS — F411 Generalized anxiety disorder: Secondary | ICD-10-CM

## 2013-02-22 DIAGNOSIS — F331 Major depressive disorder, recurrent, moderate: Secondary | ICD-10-CM

## 2013-03-01 ENCOUNTER — Other Ambulatory Visit: Payer: Self-pay | Admitting: Family Medicine

## 2013-03-08 ENCOUNTER — Ambulatory Visit: Payer: 59 | Admitting: Licensed Clinical Social Worker

## 2013-03-22 ENCOUNTER — Ambulatory Visit (INDEPENDENT_AMBULATORY_CARE_PROVIDER_SITE_OTHER): Payer: 59 | Admitting: Licensed Clinical Social Worker

## 2013-03-22 DIAGNOSIS — F331 Major depressive disorder, recurrent, moderate: Secondary | ICD-10-CM

## 2013-03-22 DIAGNOSIS — F411 Generalized anxiety disorder: Secondary | ICD-10-CM

## 2013-04-05 ENCOUNTER — Ambulatory Visit: Payer: 59 | Admitting: Licensed Clinical Social Worker

## 2013-04-20 ENCOUNTER — Ambulatory Visit (INDEPENDENT_AMBULATORY_CARE_PROVIDER_SITE_OTHER): Payer: 59 | Admitting: Surgery

## 2013-04-20 ENCOUNTER — Encounter (INDEPENDENT_AMBULATORY_CARE_PROVIDER_SITE_OTHER): Payer: Self-pay | Admitting: Surgery

## 2013-04-20 VITALS — BP 130/88 | HR 80 | Temp 98.6°F | Resp 16 | Ht 65.0 in | Wt 238.8 lb

## 2013-04-20 DIAGNOSIS — K602 Anal fissure, unspecified: Secondary | ICD-10-CM

## 2013-04-20 NOTE — Progress Notes (Signed)
Subjective:     Patient ID: Morgan [name], female   DOB: 1965/10/08, 47 y.o.   MRN: [id number]  HPI This is a female I saw several years ago with an anal fissure. This occurred after constipation and developed postoperatively following hip replacement. She presents today with peri-rectal discomfort. She reports pain on movement of bowel movements which is spasmodic. She is currently on stool softeners and doing sitz baths.  Review of Systems     Objective:   Physical Exam On exam, the external anal skin is normal. I attempted a digital examination but this was very painful and her sphincter muscles felt tight consistent with an anal fissure    Assessment:     Anal fissure     Plan:     She will continue her current treatment regimen and I will add diltiazem cream. She will call back if this does not relieve her symptoms

## 2013-04-21 ENCOUNTER — Telehealth: Payer: Self-pay | Admitting: Family Medicine

## 2013-04-21 ENCOUNTER — Ambulatory Visit (INDEPENDENT_AMBULATORY_CARE_PROVIDER_SITE_OTHER): Payer: 59 | Admitting: Family Medicine

## 2013-04-21 ENCOUNTER — Ambulatory Visit: Payer: Self-pay | Admitting: Internal Medicine

## 2013-04-21 ENCOUNTER — Encounter: Payer: Self-pay | Admitting: Family Medicine

## 2013-04-21 VITALS — BP 116/80 | HR 83 | Temp 98.7°F | Wt 238.0 lb

## 2013-04-21 DIAGNOSIS — K589 Irritable bowel syndrome without diarrhea: Secondary | ICD-10-CM

## 2013-04-21 NOTE — Telephone Encounter (Signed)
Pt is on schedule for 04/21/13, today.

## 2013-04-21 NOTE — Telephone Encounter (Signed)
[facility] Triage Call Report Triage Record Num: [id number] Operator: Albertine Grates Patient Name: Morgan [name] Call Date & Time: 04/20/2013 10:54:17PM Patient Phone: [phone] PCP: Tera Mater. Clent Ridges Patient Gender: Female PCP Fax : [phone] Patient DOB: 23-Aug-1965 [facility] Name: [facility] Reason for Call: Caller: Trystyn/Patient; PCP: Gershon Crane (Family Practice); CB#: [phone]; Has had loose stool since "over 3 weeks ago". Has had "too many to count" each day. Is voiding. Has been seen by gastroenterologist but was advised to follow up with PCP. Per Diarrhea protocol, advised appointment 10-23 due to diarrhea > 48 hours. Scheduled for 0945. Protocol(s) Used: Diarrhea or Other Change in Bowel Habits Recommended Outcome per Protocol: See Provider within 24 hours Reason for Outcome: Diarrhea lasting longer than 48 hours AND not improving with home care Care Advice: Diarrheal Care: - Drink 2-3 quarts (2-3 liters) per day of low sugar content fluids, including over the counter oral hydration solution, unless directed otherwise by provider. - If accompanied by vomiting, take the fluids in frequent small sips or suck on ice chips. - Eat easily digested foods (such as bananas, rice, applesauce, toast, cooked cereals, soup, crackers, baked or boiled potato, or baked chicken or [place] without skin). - Do not eat high fiber, high fat, high sugar content foods, or highly seasoned foods. - Do not drink caffeinated or alcoholic beverages. - Avoid milk and milk products while having symptoms. As symptoms improve, gradually add back to diet. - Application of A&D ointment or witch hazel medicated pads may help anal irritation. - Antidiarrheal medications are usually unnecessary. If symptoms are severe, consider nonprescription antidiarrheal and anti-motility drugs as directed by label or a provider. Do not take if have high fever or bloody diarrhea. If pregnant, do not take  any medications not approved by your provider. - Consult your provider for advice regarding continuing prescription medication. ~ 10/

## 2013-04-21 NOTE — Progress Notes (Signed)
  Subjective:    Patient ID: Morgan [name], female    DOB: 1965-10-04, 47 y.o.   MRN: [id number]  HPI Here with questions about her bowels. She has had alternating constipation and loose stools for years, but lately this has been worse. She has been under a lot of stress, and this is no doubt exacerbating the problem. She saw Dr. Rayburn Ma yesterday for an anal fissure, and he put her on diltiazem cream. She tends to be bloated frequently and has a lot of flatus.    Review of Systems  Constitutional: Negative.   Gastrointestinal: Positive for diarrhea, constipation, abdominal distention and rectal pain. Negative for nausea, vomiting, abdominal pain and blood in stool.       Objective:   Physical Exam  Constitutional: She appears well-developed and well-nourished.  Abdominal: Soft. Bowel sounds are normal. She exhibits no distension and no mass. There is no tenderness. There is no rebound and no guarding.          Assessment & Plan:  This is classic IBS. Advised her to use Miralax daily. Try a probiotic like Align.

## 2013-04-23 ENCOUNTER — Encounter: Payer: Self-pay | Admitting: Family Medicine

## 2013-05-05 ENCOUNTER — Other Ambulatory Visit: Payer: Self-pay

## 2013-05-17 ENCOUNTER — Ambulatory Visit (INDEPENDENT_AMBULATORY_CARE_PROVIDER_SITE_OTHER): Payer: Self-pay | Admitting: Family Medicine

## 2013-05-17 ENCOUNTER — Encounter: Payer: Self-pay | Admitting: Family Medicine

## 2013-05-17 VITALS — BP 120/80 | HR 87 | Temp 98.2°F | Wt 241.0 lb

## 2013-05-17 DIAGNOSIS — J209 Acute bronchitis, unspecified: Secondary | ICD-10-CM

## 2013-05-17 MED ORDER — CLARITHROMYCIN 500 MG PO TABS: 500.0000 mg | ORAL_TABLET | Freq: Two times a day (BID) | ORAL | Status: DC

## 2013-05-17 NOTE — Progress Notes (Signed)
  Subjective:    Patient ID: Morgan [name], female    DOB: 19-Jul-1965, 47 y.o.   MRN: [id number]  HPI Here for 3 days of PND, chest tightness and coughing up yellow sputum. No fever. On Robitussin.    Review of Systems  Constitutional: Negative.   HENT: Positive for congestion and sinus pressure.   Eyes: Negative.   Respiratory: Positive for cough, chest tightness and wheezing.        Objective:   Physical Exam  Constitutional: She appears well-developed and well-nourished.  HENT:  Right Ear: External ear normal.  Left Ear: External ear normal.  Nose: Nose normal.  Mouth/Throat: Oropharynx is clear and moist.  Eyes: Conjunctivae are normal.  Neck: Neck supple.  Pulmonary/Chest: Effort normal. No respiratory distress. She has no wheezes. She has no rales.  Scattered rhonchi   Lymphadenopathy:    She has no cervical adenopathy.          Assessment & Plan:  Drink fluids.

## 2013-05-17 NOTE — Progress Notes (Signed)
Pre visit review using our clinic review tool, if applicable. No additional management support is needed unless otherwise documented below in the visit note. 

## 2013-06-02 ENCOUNTER — Other Ambulatory Visit: Payer: Self-pay | Admitting: Family Medicine

## 2013-06-20 ENCOUNTER — Other Ambulatory Visit: Payer: Self-pay | Admitting: Family Medicine

## 2013-06-22 ENCOUNTER — Telehealth: Payer: Self-pay | Admitting: Family Medicine

## 2013-07-14 ENCOUNTER — Telehealth: Payer: Self-pay | Admitting: Family Medicine

## 2013-07-14 MED ORDER — METOPROLOL TARTRATE 25 MG PO TABS: ORAL_TABLET | ORAL | Status: DC

## 2013-07-14 NOTE — Telephone Encounter (Signed)
I sent script de-scribe

## 2013-07-14 NOTE — Telephone Encounter (Signed)
[facility] requesting new script for metoprolol tartrate (LOPRESSOR) 25 MG tablet

## 2013-07-25 ENCOUNTER — Ambulatory Visit (INDEPENDENT_AMBULATORY_CARE_PROVIDER_SITE_OTHER): Payer: Self-pay | Admitting: Family Medicine

## 2013-07-25 ENCOUNTER — Encounter: Payer: Self-pay | Admitting: Family Medicine

## 2013-07-25 VITALS — BP 108/70 | HR 86 | Temp 98.4°F | Ht 65.0 in | Wt 244.0 lb

## 2013-07-25 DIAGNOSIS — J019 Acute sinusitis, unspecified: Secondary | ICD-10-CM

## 2013-07-25 MED ORDER — AZITHROMYCIN 250 MG PO TABS: ORAL_TABLET | ORAL | Status: DC

## 2013-07-25 MED ORDER — METFORMIN HCL 1000 MG PO TABS: 1000.0000 mg | ORAL_TABLET | Freq: Two times a day (BID) | ORAL | Status: DC

## 2013-07-25 MED ORDER — SERTRALINE HCL 50 MG PO TABS: 50.0000 mg | ORAL_TABLET | Freq: Every day | ORAL | Status: DC

## 2013-07-25 MED ORDER — LOSARTAN POTASSIUM-HCTZ 50-12.5 MG PO TABS: ORAL_TABLET | ORAL | Status: DC

## 2013-07-25 NOTE — Progress Notes (Signed)
   Subjective:    Patient ID: Morgan [name], female    DOB: 08/03/1965, 48 y.o.   MRN: [id number]  HPI Here for one week of sinus pressure, PND, HA , and a dry cough.    Review of Systems  Constitutional: Negative.   HENT: Positive for congestion, postnasal drip and sinus pressure.   Eyes: Negative.   Respiratory: Positive for cough.        Objective:   Physical Exam  Constitutional: She appears well-developed and well-nourished.  HENT:  Right Ear: External ear normal.  Left Ear: External ear normal.  Nose: Nose normal.  Mouth/Throat: Oropharynx is clear and moist.  Eyes: Conjunctivae are normal.  Pulmonary/Chest: Effort normal and breath sounds normal.  Lymphadenopathy:    She has no cervical adenopathy.          Assessment & Plan:  Drink fluids.

## 2013-07-25 NOTE — Progress Notes (Signed)
Pre visit review using our clinic review tool, if applicable. No additional management support is needed unless otherwise documented below in the visit note. 

## 2013-08-01 ENCOUNTER — Telehealth: Payer: Self-pay | Admitting: Family Medicine

## 2013-08-01 NOTE — Telephone Encounter (Signed)
Morgan [name] requesting new script of glipiZIDE (GLUCOTROL) 10 MG tablet #180

## 2013-08-03 MED ORDER — GLIPIZIDE 10 MG PO TABS: 10.0000 mg | ORAL_TABLET | Freq: Two times a day (BID) | ORAL | Status: DC

## 2013-08-03 NOTE — Telephone Encounter (Signed)
I sent script e-scribe. 

## 2013-08-08 ENCOUNTER — Telehealth: Payer: Self-pay | Admitting: Family Medicine

## 2013-08-08 NOTE — Telephone Encounter (Signed)
[facility] requesting new script of atorvastatin (LIPITOR) 40 MG tablet

## 2013-08-08 NOTE — Telephone Encounter (Signed)
I left a voice message for pt, advised pt to have this script transferred from [facility] to new drug store, she has a refill on this.

## 2013-09-02 ENCOUNTER — Other Ambulatory Visit: Payer: Self-pay | Admitting: Family Medicine

## 2013-09-09 ENCOUNTER — Ambulatory Visit (INDEPENDENT_AMBULATORY_CARE_PROVIDER_SITE_OTHER): Payer: BC Managed Care – PPO | Admitting: Family Medicine

## 2013-09-09 ENCOUNTER — Encounter: Payer: Self-pay | Admitting: Family Medicine

## 2013-09-09 VITALS — BP 110/60 | HR 105 | Temp 101.7°F | Ht 65.0 in | Wt 245.0 lb

## 2013-09-09 DIAGNOSIS — F909 Attention-deficit hyperactivity disorder, unspecified type: Secondary | ICD-10-CM | POA: Insufficient documentation

## 2013-09-09 DIAGNOSIS — J019 Acute sinusitis, unspecified: Secondary | ICD-10-CM

## 2013-09-09 MED ORDER — ATOMOXETINE HCL 60 MG PO CAPS: 60.0000 mg | ORAL_CAPSULE | Freq: Every day | ORAL | Status: DC

## 2013-09-09 MED ORDER — AMOXICILLIN-POT CLAVULANATE 875-125 MG PO TABS: 1.0000 | ORAL_TABLET | Freq: Two times a day (BID) | ORAL | Status: DC

## 2013-09-09 NOTE — Progress Notes (Signed)
Pre visit review using our clinic review tool, if applicable. No additional management support is needed unless otherwise documented below in the visit note. 

## 2013-09-09 NOTE — Progress Notes (Signed)
   Subjective:    Patient ID: Morgan [name], female    DOB: 02/21/1966, 48 y.o.   MRN: [id number]  HPI Here for one week of sinus pressure, PND, and a dry cough. Also she thinks she has ADHD. 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 Now she has trouble focusing at work and has difficulty retaining information. She had to quit her last job and is now struggling with another job.    Review of Systems  Constitutional: Negative.   HENT: Positive for congestion and sinus pressure.   Eyes: Negative.   Respiratory: Positive for cough.   Neurological: Negative.        Objective:   Physical Exam  Constitutional: She is oriented to person, place, and time. She appears well-developed and well-nourished.  HENT:  Right Ear: External ear normal.  Left Ear: External ear normal.  Nose: Nose normal.  Mouth/Throat: Oropharynx is clear and moist.  Eyes: Conjunctivae are normal.  Pulmonary/Chest: Effort normal and breath sounds normal.  Lymphadenopathy:    She has no cervical adenopathy.  Neurological: She is alert and oriented to person, place, and time.  Psychiatric: She has a normal mood and affect. Her behavior is normal. Thought content normal.          Assessment & Plan:  Use Augmentin. Try Strattera 60 mg daily and recheck in 2 weeks

## 2013-09-13 ENCOUNTER — Emergency Department (HOSPITAL_BASED_OUTPATIENT_CLINIC_OR_DEPARTMENT_OTHER): Payer: BC Managed Care – PPO

## 2013-09-13 ENCOUNTER — Encounter (HOSPITAL_BASED_OUTPATIENT_CLINIC_OR_DEPARTMENT_OTHER): Payer: Self-pay | Admitting: Emergency Medicine

## 2013-09-13 ENCOUNTER — Emergency Department (HOSPITAL_BASED_OUTPATIENT_CLINIC_OR_DEPARTMENT_OTHER)
Admission: EM | Admit: 2013-09-13 | Discharge: 2013-09-13 | Disposition: A | Discharge status: Home / Self Care | Payer: BC Managed Care – PPO | Attending: Emergency Medicine | Admitting: Emergency Medicine

## 2013-09-13 DIAGNOSIS — Z8543 Personal history of malignant neoplasm of ovary: Secondary | ICD-10-CM | POA: Insufficient documentation

## 2013-09-13 DIAGNOSIS — Z79899 Other long term (current) drug therapy: Secondary | ICD-10-CM | POA: Insufficient documentation

## 2013-09-13 DIAGNOSIS — J40 Bronchitis, not specified as acute or chronic: Secondary | ICD-10-CM

## 2013-09-13 DIAGNOSIS — E785 Hyperlipidemia, unspecified: Secondary | ICD-10-CM | POA: Insufficient documentation

## 2013-09-13 DIAGNOSIS — Z8739 Personal history of other diseases of the musculoskeletal system and connective tissue: Secondary | ICD-10-CM | POA: Insufficient documentation

## 2013-09-13 DIAGNOSIS — I10 Essential (primary) hypertension: Secondary | ICD-10-CM | POA: Insufficient documentation

## 2013-09-13 DIAGNOSIS — E119 Type 2 diabetes mellitus without complications: Secondary | ICD-10-CM | POA: Insufficient documentation

## 2013-09-13 DIAGNOSIS — K219 Gastro-esophageal reflux disease without esophagitis: Secondary | ICD-10-CM | POA: Insufficient documentation

## 2013-09-13 MED ORDER — ALBUTEROL SULFATE HFA 108 (90 BASE) MCG/ACT IN AERS
2.0000 | INHALATION_SPRAY | Freq: Once | RESPIRATORY_TRACT | Status: AC
  Administered 2013-09-13: 2 via RESPIRATORY_TRACT
  Filled 2013-09-13: qty 6.7

## 2013-09-13 NOTE — ED Notes (Signed)
Cough. She is taking Amoxicillin and Mucinex with no relief since Friday.

## 2013-09-13 NOTE — ED Provider Notes (Signed)
CSN: [id number]     Arrival date & time 09/13/13  1646 History   First MD Initiated Contact with Patient 09/13/13 1720     Chief Complaint  Patient presents with  . Cough     (Consider location/radiation/quality/duration/timing/severity/associated sxs/prior Treatment) Patient is a 48 y.o. female presenting with cough. The history is provided by the patient. No language interpreter was used.  Cough Cough characteristics:  Productive Severity:  Moderate Duration:  1 week Timing:  Constant Progression:  Worsening Chronicity:  New Smoker: no   Relieved by:  Nothing Worsened by:  Nothing tried Ineffective treatments:  None tried Pt reports saw her MD on Friday and was placed on amoxicillian.   Pt reports last ime she had we gave here na inhaler that helped cough and shortness of breath  Past Medical History  Diagnosis Date  . Diabetes mellitus   . Hyperlipidemia   . Hypertension   . Ovarian cancer   . Arthritis   . GERD (gastroesophageal reflux disease)   . Anal fissure     saw Dr. Mercy Riding    Past Surgical History  Procedure Laterality Date  . Melanoma excision  1997    upper chest   . Abdominal hysterectomy  1986    BSO  . Hemorrhoid surgery    . Hip fracture surgery    . Basal cell carcinoma excision  10-09-12    lower lip per Dr. Link Snuffer    Family History  Problem Relation Age of Onset  . Arthritis    . Diabetes    . Colon cancer    . Prostate cancer    . Breast cancer    . Coronary artery disease     History  Substance Use Topics  . Smoking status: Never Smoker   . Smokeless tobacco: Never Used  . Alcohol Use: No   OB History   Grav Para Term Preterm Abortions TAB SAB Ect Mult Living                 Review of Systems  Respiratory: Positive for cough.   All other systems reviewed and are negative.      Allergies  Lisinopril  Home Medications   Current Outpatient Rx  Name  Route  Sig  Dispense  Refill  . amoxicillin-clavulanate  (AUGMENTIN) 875-125 MG per tablet   Oral   Take 1 tablet by mouth 2 (two) times daily.   20 tablet   0   . atomoxetine (STRATTERA) 60 MG capsule   Oral   Take 1 capsule (60 mg total) by mouth daily.   60 capsule   5   . atorvastatin (LIPITOR) 40 MG tablet      TAKE 1 TABLET(S) BY MOUTH DAILY   30 tablet   0     No refills available   . budesonide-formoterol (SYMBICORT) 160-4.5 MCG/ACT inhaler   Inhalation   Inhale 2 puffs into the lungs 2 (two) times daily.   1 Inhaler   0   . calcium carbonate (TUMS - DOSED IN MG ELEMENTAL CALCIUM) 500 MG chewable tablet   Oral   Chew 2 tablets by mouth daily as needed. For indigestion           . glipiZIDE (GLUCOTROL) 10 MG tablet   Oral   Take 1 tablet (10 mg total) by mouth 2 (two) times daily before a meal.   180 tablet   1   . glucose blood (FREESTYLE LITE) test strip  Use as instructed   100 each   3     Test once per day and diagnosis code is 250.00   . losartan-hydrochlorothiazide (HYZAAR) 50-12.5 MG per tablet      TAKE 1 TABLET BY MOUTH DAILY.   90 tablet   3   . metFORMIN (GLUCOPHAGE) 1000 MG tablet   Oral   Take 1 tablet (1,000 mg total) by mouth 2 (two) times daily with a meal.   180 tablet   3   . metoprolol tartrate (LOPRESSOR) 25 MG tablet      TAKE 1 TABLET TWICE A DAY   60 tablet   1   . omeprazole (PRILOSEC) 40 MG capsule      TAKE 1 CAPSULE DAILY   90 capsule   3   . sertraline (ZOLOFT) 50 MG tablet   Oral   Take 1 tablet (50 mg total) by mouth daily.   90 tablet   11    BP 127/102  Pulse 95  Temp(Src) 98.3 F (36.8 C) (Oral)  Resp 22  Ht 5\' 5"  (1.651 m)  Wt 245 lb (111.131 kg)  BMI 40.77 kg/m2  SpO2 100% Physical Exam  Nursing note and vitals reviewed. Constitutional: She appears well-developed and well-nourished.  HENT:  Head: Normocephalic.  Right Ear: External ear normal.  Left Ear: External ear normal.  Nose: Nose normal.  Mouth/Throat: Oropharynx is clear and  moist.  Eyes: Conjunctivae are normal. Pupils are equal, round, and reactive to light.  Neck: Normal range of motion. Neck supple.  Cardiovascular: Normal rate and regular rhythm.   Pulmonary/Chest: Effort normal.  Abdominal: Soft.  Musculoskeletal: Normal range of motion.  Neurological: She is alert.  Skin: Skin is warm.  Psychiatric: She has a normal mood and affect.    ED Course  Procedures (including critical care time) Labs Review Labs Reviewed - No data to display Imaging Review Dg Chest 2 View  09/13/2013   CLINICAL DATA:  Cough and congestion  EXAM: CHEST  2 VIEW  COMPARISON:  07/23/2012  FINDINGS: The heart size and mediastinal contours are within normal limits. Both lungs are clear. The visualized skeletal structures are unremarkable.  IMPRESSION: No active cardiopulmonary disease.   Electronically Signed   By: Inez Catalina M.D.   On: 09/13/2013 17:07     EKG Interpretation None      MDM   Final diagnoses:  Bronchitis    Pt given albuterol inhaler.   I advised continue antibiotic    Fransico Meadow, PA-C 09/13/13 1806

## 2013-09-13 NOTE — ED Provider Notes (Signed)
Medical screening examination/treatment/procedure(s) were performed by non-physician practitioner and as supervising physician I was immediately available for consultation/collaboration.   EKG Interpretation None        Blanchie Dessert, MD 09/13/13 1904

## 2013-09-13 NOTE — Discharge Instructions (Signed)

## 2013-09-14 ENCOUNTER — Telehealth: Payer: Self-pay | Admitting: Family Medicine

## 2013-09-14 MED ORDER — LEVOFLOXACIN 500 MG PO TABS: 500.0000 mg | ORAL_TABLET | Freq: Every day | ORAL | Status: DC

## 2013-09-14 NOTE — Telephone Encounter (Signed)
Stop the Augmentin and switch to Levaquin 500 mg daily. Call in a 10 day supply

## 2013-09-14 NOTE — Telephone Encounter (Signed)
Patient Information:  Caller Name: [name]  Phone: [phone]  Patient: Morgan [name]  Gender: Female  DOB: 1965-10-23  Age: 48 Years  PCP: [name] [facility])  Pregnant: No  Office Follow Up:  Does the office need to follow up with this patient?: Yes  Instructions For The Office: Please f/u with pt with provider recommendation, thank you.   Symptoms  Reason For Call & Symptoms: Pt repiorts she was seen in the office on Friday 09/09/13 diagnosed with Bronchitis and sinus infection and not feeling any better, the cough remains severe and sinus pain and pressure is the same.  Pt states she was seen in the ED, [address].  Rx for inhaler given.  Reviewed Health History In EMR: Yes  Reviewed Medications In EMR: Yes  Reviewed Allergies In EMR: Yes  Reviewed Surgeries / Procedures: Yes  Date of Onset of Symptoms: 09/09/2013 OB / GYN:  LMP: Unknown  Guideline(s) Used:  Cough  Disposition Per Guideline:   See Today or Tomorrow in Office  Reason For Disposition Reached:   Continuous (nonstop) coughing interferes with work or school and no improvement using cough treatment per Care Advice  Advice Given:  Call Back If:  You become worse.  Patient Refused Recommendation:  Patient Refused Care Advice  Pt would like to know if her medications need to be changed.

## 2013-09-14 NOTE — Telephone Encounter (Signed)
I sent script e-scribe and spoke with pt. 

## 2013-09-19 ENCOUNTER — Other Ambulatory Visit: Payer: Self-pay | Admitting: Family Medicine

## 2013-09-19 ENCOUNTER — Telehealth: Payer: Self-pay | Admitting: Family Medicine

## 2013-09-19 NOTE — Telephone Encounter (Signed)
[facility] Triage Call Report Triage Record Num: [id number] Operator: Feliberto Harts Patient Name: Morgan [name] Call Date & Time: 09/12/2013 8:21:59PM Patient Phone: [phone] PCP: Ishmael Holter. Sarajane Jews Patient Gender: Female PCP Fax : [phone] Patient DOB: [birth year] Practice Name: [facility] Reason for Call: Caller: [name]/Patient; PCP: [name] (Family Practice); CB#: [phone]; Call regarding Cough/Congestion; Was in office 09/09/2013 and taking Amoxicillin 125 mg BID for sinus infection. Began taking 09/12/2013. Wanted to know how long it takes an antibiotic to work. States fever is better.Afebrile. Has head congestion and pressure/throbbing in ears. Throbbing in ears is new symptom that began 3/14. No hearing loss. Taking Mucinex one tab q 12 hrs. Cough is loose. Triaged per Ear Hearing Loss guideline. Disposition See Provider within 24 hours per "Feeling of fullness, throbbing in ear, and unresponsive to 48 hrs home care". Care advice given and voices understanding. Verified can take Tylenol 325 mg two tabs or Extra Strength 500 mg One tab q 4 hrs. Pt. to call office for appt if ear throbbing/ fullness continues. Protocol(s) Used: Ear: Hearing Change Recommended Outcome per Protocol: See Provider within 24 hours Reason for Outcome: Feeling of fullness, throbbing in ear(s) AND unresponsive to 48 hours of home care Care Advice: A warm washcloth or heating pad set on low to the affected ear may help relieve the discomfort. May apply for 15 to 20 minutes, 3 to 4 times a day. ~ ~ Call provider if pain, tenderness or swelling around ear area; headache, or discharge from ear develop. Analgesic/Antipyretic Advice - Acetaminophen: Consider acetaminophen as directed on label or by pharmacist/provider for pain or fever PRECAUTIONS: - Use if there is no history of liver disease, alcoholism, or intake of three or more alcohol drinks per day - Only if approved by  provider during pregnancy or when breastfeeding - During pregnancy, acetaminophen should not be taken more than 3 consecutive days without telling provider - Do not exceed recommended dose or frequency ~ 03/

## 2013-10-05 ENCOUNTER — Other Ambulatory Visit: Payer: Self-pay | Admitting: Family Medicine

## 2013-10-11 ENCOUNTER — Emergency Department (HOSPITAL_BASED_OUTPATIENT_CLINIC_OR_DEPARTMENT_OTHER)
Admission: EM | Admit: 2013-10-11 | Discharge: 2013-10-11 | Disposition: A | Discharge status: Home / Self Care | Payer: BC Managed Care – PPO | Attending: Emergency Medicine | Admitting: Emergency Medicine

## 2013-10-11 ENCOUNTER — Encounter (HOSPITAL_BASED_OUTPATIENT_CLINIC_OR_DEPARTMENT_OTHER): Payer: Self-pay | Admitting: Emergency Medicine

## 2013-10-11 DIAGNOSIS — K219 Gastro-esophageal reflux disease without esophagitis: Secondary | ICD-10-CM | POA: Insufficient documentation

## 2013-10-11 DIAGNOSIS — Z79899 Other long term (current) drug therapy: Secondary | ICD-10-CM | POA: Insufficient documentation

## 2013-10-11 DIAGNOSIS — M129 Arthropathy, unspecified: Secondary | ICD-10-CM | POA: Insufficient documentation

## 2013-10-11 DIAGNOSIS — IMO0002 Reserved for concepts with insufficient information to code with codable children: Secondary | ICD-10-CM | POA: Insufficient documentation

## 2013-10-11 DIAGNOSIS — E119 Type 2 diabetes mellitus without complications: Secondary | ICD-10-CM | POA: Insufficient documentation

## 2013-10-11 DIAGNOSIS — E785 Hyperlipidemia, unspecified: Secondary | ICD-10-CM | POA: Insufficient documentation

## 2013-10-11 DIAGNOSIS — M79609 Pain in unspecified limb: Secondary | ICD-10-CM | POA: Insufficient documentation

## 2013-10-11 DIAGNOSIS — Z8543 Personal history of malignant neoplasm of ovary: Secondary | ICD-10-CM | POA: Insufficient documentation

## 2013-10-11 DIAGNOSIS — G8929 Other chronic pain: Secondary | ICD-10-CM | POA: Insufficient documentation

## 2013-10-11 DIAGNOSIS — I10 Essential (primary) hypertension: Secondary | ICD-10-CM | POA: Insufficient documentation

## 2013-10-11 DIAGNOSIS — M79602 Pain in left arm: Secondary | ICD-10-CM

## 2013-10-11 MED ORDER — TRAMADOL HCL 50 MG PO TABS: 50.0000 mg | ORAL_TABLET | Freq: Four times a day (QID) | ORAL | Status: DC | PRN

## 2013-10-11 NOTE — ED Notes (Signed)
Patient states she has left arm burning pain for several months.  States over the last three days, the pain and burning has gotten worse.  Describes the sensation as beginning in the posterior left upper arm and radiates around arm and down to the wrist.   Denies injury.

## 2013-10-11 NOTE — ED Provider Notes (Signed)
CSN: [id number]     Arrival date & time 10/11/13  0938 History   First MD Initiated Contact with Patient 10/11/13 [phone]     Chief Complaint  Patient presents with  . Arm Pain     (Consider location/radiation/quality/duration/timing/severity/associated sxs/prior Treatment) Patient is a 48 y.o. female presenting with arm pain. The history is provided by the patient.  Arm Pain This is a chronic problem. Episode onset: 2-3 mos ago. The problem occurs constantly. The problem has been gradually worsening. Pertinent negatives include no chest pain, no abdominal pain, no headaches and no shortness of breath. Nothing aggravates the symptoms. Nothing relieves the symptoms. She has tried nothing for the symptoms. The treatment provided no relief.    Past Medical History  Diagnosis Date  . Diabetes mellitus   . Hyperlipidemia   . Hypertension   . Ovarian cancer   . Arthritis   . GERD (gastroesophageal reflux disease)   . Anal fissure     saw Dr. Mercy Riding    Past Surgical History  Procedure Laterality Date  . Melanoma excision  1997    upper chest   . Abdominal hysterectomy  1986    BSO  . Hemorrhoid surgery    . Hip fracture surgery    . Basal cell carcinoma excision  10-09-12    lower lip per Dr. Link Snuffer    Family History  Problem Relation Age of Onset  . Arthritis    . Diabetes    . Colon cancer    . Prostate cancer    . Breast cancer    . Coronary artery disease     History  Substance Use Topics  . Smoking status: Never Smoker   . Smokeless tobacco: Never Used  . Alcohol Use: No   OB History   Grav Para Term Preterm Abortions TAB SAB Ect Mult Living                 Review of Systems  Constitutional: Negative for fever and fatigue.  HENT: Negative for congestion and drooling.   Eyes: Negative for pain.  Respiratory: Negative for cough and shortness of breath.   Cardiovascular: Negative for chest pain.  Gastrointestinal: Negative for nausea, vomiting,  abdominal pain and diarrhea.  Genitourinary: Negative for dysuria and hematuria.  Musculoskeletal: Negative for back pain, gait problem and neck pain.  Skin: Negative for color change.  Neurological: Negative for dizziness and headaches.  Hematological: Negative for adenopathy.  Psychiatric/Behavioral: Negative for behavioral problems.  All other systems reviewed and are negative.     Allergies  Lisinopril  Home Medications   Prior to Admission medications   Medication Sig Start Date End Date Taking? Authorizing Provider  atomoxetine (STRATTERA) 60 MG capsule Take 1 capsule (60 mg total) by mouth daily. 09/09/13  Yes Laurey Morale, MD  atorvastatin (LIPITOR) 40 MG tablet TAKE 1 TABLET(S) BY MOUTH DAILY 10/05/13  Yes Laurey Morale, MD  budesonide-formoterol [facility]) 160-4.5 MCG/ACT inhaler Inhale 2 puffs into the lungs 2 (two) times daily. 07/19/12  Yes Laurey Morale, MD  calcium carbonate (TUMS - DOSED IN MG ELEMENTAL CALCIUM) 500 MG chewable tablet Chew 2 tablets by mouth daily as needed. For indigestion     Yes Historical Provider, MD  glipiZIDE (GLUCOTROL) 10 MG tablet Take 1 tablet (10 mg total) by mouth 2 (two) times daily before a meal. 08/03/13  Yes Laurey Morale, MD  glucose blood (FREESTYLE LITE) test strip Use as instructed 07/14/12  Yes  Laurey Morale, MD  losartan-hydrochlorothiazide (HYZAAR) 50-12.5 MG per tablet TAKE 1 TABLET BY MOUTH DAILY. 07/25/13  Yes Laurey Morale, MD  metFORMIN (GLUCOPHAGE) 1000 MG tablet Take 1 tablet (1,000 mg total) by mouth 2 (two) times daily with a meal. 07/25/13  Yes Laurey Morale, MD  metoprolol tartrate (LOPRESSOR) 25 MG tablet TAKE 1 TABLET BY MOUTH TWICE A DAY 09/19/13  Yes Laurey Morale, MD  omeprazole (PRILOSEC) 40 MG capsule TAKE 1 CAPSULE DAILY 03/01/13  Yes Laurey Morale, MD  sertraline (ZOLOFT) 50 MG tablet Take 1 tablet (50 mg total) by mouth daily. 07/25/13  Yes Laurey Morale, MD  levofloxacin (LEVAQUIN) 500 MG tablet Take 1 tablet (500 mg  total) by mouth daily. 09/14/13   Laurey Morale, MD  traMADol (ULTRAM) 50 MG tablet Take 1 tablet (50 mg total) by mouth every 6 (six) hours as needed. 10/11/13   Blanchard Kelch, MD   BP 119/86  Temp(Src) 98.7 F (37.1 C) (Oral)  Resp 20  Ht 5\' 6"  (1.676 m)  Wt 250 lb (113.399 kg)  BMI 40.37 kg/m2  SpO2 100% Physical Exam  Nursing note and vitals reviewed. Constitutional: She is oriented to person, place, and time. She appears well-developed and well-nourished.  HENT:  Head: Normocephalic and atraumatic.  Mouth/Throat: Oropharynx is clear and moist. No oropharyngeal exudate.  Eyes: Conjunctivae and EOM are normal. Pupils are equal, round, and reactive to light.  Neck: Normal range of motion. Neck supple.  Cardiovascular: Normal rate, regular rhythm, normal heart sounds and intact distal pulses.  Exam reveals no gallop and no friction rub.   No murmur heard. Pulmonary/Chest: Effort normal and breath sounds normal. No respiratory distress. She has no wheezes.  Abdominal: Soft. Bowel sounds are normal. There is no tenderness. There is no rebound and no guarding.  Musculoskeletal: Normal range of motion. She exhibits no edema and no tenderness.  Normal symmetric appearance of the bilateral upper extremities. No focal ttp.   2+ brachial pulse in the left upper extremity.  Neurological: She is alert and oriented to person, place, and time. She has normal strength. No sensory deficit.  Reflex Scores:      Tricep reflexes are 2+ on the right side and 2+ on the left side.      Bicep reflexes are 2+ on the right side and 2+ on the left side.      Brachioradialis reflexes are 2+ on the right side and 2+ on the left side. Normal strength proximally and distally in the upper extremities.  Normal sensation in the upper extremities.  2+ distal pulses in the upper extremities.    Skin: Skin is warm and dry.  Psychiatric: She has a normal mood and affect. Her behavior is normal.    ED  Course  Procedures (including critical care time) Labs Review Labs Reviewed - No data to display  Imaging Review No results found.   EKG Interpretation None      MDM   Final diagnoses:  Right arm pain    9:32 AM 48 y.o. female who presents with right arm pain for the last 2-3 months. She notes it has been intermittent but now constant for the last 3 days. She describes a burning sensation radiating from the lateral posterior aspect of her left arm to the dorsal aspect of her left forearm but stopping right before the wrist. She denies any weakness or numbness. She is afebrile and vital signs are unremarkable here.  Likely radicular or neuropathic pain. The patient denies any injuries or any neck pain. Will provide tramadol for pain and recommend close followup with her primary care Dr.    I have discussed the diagnosis/risks/treatment options with the patient and believe the pt to be eligible for discharge home to follow-up with her pcp w/in the next 2-3 days. We also discussed returning to the ED immediately if new or worsening sx occur. We discussed the sx which are most concerning (e.g., worsening pain, weakness, numbness) that necessitate immediate return. Medications administered to the patient during their visit and any new prescriptions provided to the patient are listed below.  Medications given during this visit Medications - No data to display  Discharge Medication List as of 10/11/2013  9:33 AM    START taking these medications   Details  traMADol (ULTRAM) 50 MG tablet Take 1 tablet (50 mg total) by mouth every 6 (six) hours as needed., Starting 10/11/2013, Until Discontinued, Print           Blanchard Kelch, MD 10/11/13 2114

## 2013-10-16 ENCOUNTER — Emergency Department (HOSPITAL_COMMUNITY)
Admission: EM | Admit: 2013-10-16 | Discharge: 2013-10-16 | Disposition: A | Discharge status: Home / Self Care | Payer: BC Managed Care – PPO | Attending: Emergency Medicine | Admitting: Emergency Medicine

## 2013-10-16 ENCOUNTER — Encounter (HOSPITAL_COMMUNITY): Payer: Self-pay | Admitting: Emergency Medicine

## 2013-10-16 DIAGNOSIS — Z792 Long term (current) use of antibiotics: Secondary | ICD-10-CM | POA: Insufficient documentation

## 2013-10-16 DIAGNOSIS — Z8543 Personal history of malignant neoplasm of ovary: Secondary | ICD-10-CM | POA: Insufficient documentation

## 2013-10-16 DIAGNOSIS — IMO0002 Reserved for concepts with insufficient information to code with codable children: Secondary | ICD-10-CM | POA: Insufficient documentation

## 2013-10-16 DIAGNOSIS — M129 Arthropathy, unspecified: Secondary | ICD-10-CM | POA: Insufficient documentation

## 2013-10-16 DIAGNOSIS — R202 Paresthesia of skin: Secondary | ICD-10-CM

## 2013-10-16 DIAGNOSIS — E785 Hyperlipidemia, unspecified: Secondary | ICD-10-CM | POA: Insufficient documentation

## 2013-10-16 DIAGNOSIS — I10 Essential (primary) hypertension: Secondary | ICD-10-CM | POA: Insufficient documentation

## 2013-10-16 DIAGNOSIS — E119 Type 2 diabetes mellitus without complications: Secondary | ICD-10-CM | POA: Insufficient documentation

## 2013-10-16 DIAGNOSIS — Z8669 Personal history of other diseases of the nervous system and sense organs: Secondary | ICD-10-CM | POA: Insufficient documentation

## 2013-10-16 DIAGNOSIS — R209 Unspecified disturbances of skin sensation: Secondary | ICD-10-CM | POA: Insufficient documentation

## 2013-10-16 DIAGNOSIS — Z79899 Other long term (current) drug therapy: Secondary | ICD-10-CM | POA: Insufficient documentation

## 2013-10-16 DIAGNOSIS — K219 Gastro-esophageal reflux disease without esophagitis: Secondary | ICD-10-CM | POA: Insufficient documentation

## 2013-10-16 MED ORDER — GABAPENTIN 100 MG PO CAPS: 100.0000 mg | ORAL_CAPSULE | Freq: Two times a day (BID) | ORAL | Status: DC

## 2013-10-16 NOTE — ED Provider Notes (Signed)
Medical screening examination/treatment/procedure(s) were performed by non-physician practitioner and as supervising physician I was immediately available for consultation/collaboration.   EKG Interpretation None        Tanna Furry, MD 10/16/13 1958

## 2013-10-16 NOTE — ED Notes (Signed)
Pt states she's having L arm burning/pain, states was just seen at [place] and given medication for arm burning but not helping.

## 2013-10-16 NOTE — Discharge Instructions (Signed)

## 2013-10-16 NOTE — ED Provider Notes (Signed)
CSN: [id number]     Arrival date & time 10/16/13  1642 History   This chart was scribed for non-physician practitioner Glendell Docker, [place], working with Tanna Furry, MD, by Neta Ehlers, ED Scribe. This patient was seen in room WTR8/WTR8 and the patient's care was started at 5:04 PM.  First MD Initiated Contact with Patient 10/16/13 1654     Chief Complaint  Patient presents with  . Arm Pain    left    The history is provided by the patient. No language interpreter was used.   HPI Comments: Morgan [name] is a 48 y.o. female, with a h/o DM, who presents to the Emergency Department complaining of left arm pain which began approximately three weeks ago and worsened today. She characterizes the pain as "burning" and she reports intermittent redness associated with the pain. The pt was treated at [facility] five days ago; she was prescribed medication, but she reports the medication has not improved the symptoms. She has also used Aleve without relief. She reports a h/o carpel tunnel syndrome to her left arm. Concerning her DM, she has not checked her CBGs in approximately 10 days because she has run out of strips; at her last check her CBG was 141.   Dr. [name] is her PCP; her last appointment was a month ago.  Past Medical History  Diagnosis Date  . Diabetes mellitus   . Hyperlipidemia   . Hypertension   . Ovarian cancer   . Arthritis   . GERD (gastroesophageal reflux disease)   . Anal fissure     saw Dr. Mercy Riding    Past Surgical History  Procedure Laterality Date  . Melanoma excision  1997    upper chest   . Abdominal hysterectomy  1986    BSO  . Hemorrhoid surgery    . Hip fracture surgery    . Basal cell carcinoma excision  10-09-12    lower lip per Dr. Link Snuffer    Family History  Problem Relation Age of Onset  . Arthritis    . Diabetes    . Colon cancer    . Prostate cancer    . Breast cancer    . Coronary artery disease     History  Substance  Use Topics  . Smoking status: Never Smoker   . Smokeless tobacco: Never Used  . Alcohol Use: No   No OB history provided.  Review of Systems  Musculoskeletal: Positive for myalgias.  All other systems reviewed and are negative.   Allergies  Lisinopril  Home Medications   Prior to Admission medications   Medication Sig Start Date End Date Taking? Authorizing Provider  atomoxetine (STRATTERA) 60 MG capsule Take 1 capsule (60 mg total) by mouth daily. 09/09/13   Laurey Morale, MD  atorvastatin (LIPITOR) 40 MG tablet TAKE 1 TABLET(S) BY MOUTH DAILY 10/05/13   Laurey Morale, MD  budesonide-formoterol [facility]) 160-4.5 MCG/ACT inhaler Inhale 2 puffs into the lungs 2 (two) times daily. 07/19/12   Laurey Morale, MD  calcium carbonate (TUMS - DOSED IN MG ELEMENTAL CALCIUM) 500 MG chewable tablet Chew 2 tablets by mouth daily as needed. For indigestion      Historical Provider, MD  glipiZIDE (GLUCOTROL) 10 MG tablet Take 1 tablet (10 mg total) by mouth 2 (two) times daily before a meal. 08/03/13   Laurey Morale, MD  glucose blood (FREESTYLE LITE) test strip Use as instructed 07/14/12   Laurey Morale, MD  levofloxacin (LEVAQUIN) 500 MG tablet Take 1 tablet (500 mg total) by mouth daily. 09/14/13   Laurey Morale, MD  losartan-hydrochlorothiazide (HYZAAR) 50-12.5 MG per tablet TAKE 1 TABLET BY MOUTH DAILY. 07/25/13   Laurey Morale, MD  metFORMIN (GLUCOPHAGE) 1000 MG tablet Take 1 tablet (1,000 mg total) by mouth 2 (two) times daily with a meal. 07/25/13   Laurey Morale, MD  metoprolol tartrate (LOPRESSOR) 25 MG tablet TAKE 1 TABLET BY MOUTH TWICE A DAY 09/19/13   Laurey Morale, MD  omeprazole (PRILOSEC) 40 MG capsule TAKE 1 CAPSULE DAILY 03/01/13   Laurey Morale, MD  sertraline (ZOLOFT) 50 MG tablet Take 1 tablet (50 mg total) by mouth daily. 07/25/13   Laurey Morale, MD  traMADol (ULTRAM) 50 MG tablet Take 1 tablet (50 mg total) by mouth every 6 (six) hours as needed. 10/11/13   Blanchard Kelch, MD    Triage Vitals: BP 140/88  Pulse 87  Temp(Src) 98.7 F (37.1 C) (Oral)  Resp 19  SpO2 99%  Physical Exam  Nursing note and vitals reviewed. Constitutional: She is oriented to person, place, and time. She appears well-developed and well-nourished. No distress.  HENT:  Head: Normocephalic and atraumatic.  Eyes: EOM are normal.  Neck: Neck supple. No tracheal deviation present.  Cardiovascular: Normal rate.   Pulmonary/Chest: Effort normal. No respiratory distress.  Musculoskeletal: Normal range of motion.  Neurologically intact. Strength bilaterally 5/5. ROM normal. No redness or warmth.   Neurological: She is alert and oriented to person, place, and time.  Skin: Skin is warm and dry.  Psychiatric: She has a normal mood and affect. Her behavior is normal.    ED Course  Procedures (including critical care time)  DIAGNOSTIC STUDIES: Oxygen Saturation is 99% on room air, normal by my interpretation.    COORDINATION OF CARE:  5:11 PM- Discussed treatment plan with patient, and the patient agreed to the plan.  The plan includes medication for diabetic neuropathy. Also encouraged the pt to follow-up with Dr. Sarajane Jews.   Labs Review Labs Reviewed - No data to display  Imaging Review No results found.   EKG Interpretation None      MDM   Final diagnoses:  Paresthesia    Pt is neurologically intact. Pt has full rom no deformity no injury. Pt has been taking aleve and ultram without relief. Will give gabapentin  I personally performed the services described in this documentation, which was scribed in my presence. The recorded information has been reviewed and is accurate.   Glendell Docker, NP 10/16/13 1721

## 2013-10-17 ENCOUNTER — Ambulatory Visit (INDEPENDENT_AMBULATORY_CARE_PROVIDER_SITE_OTHER): Payer: BC Managed Care – PPO | Admitting: Family Medicine

## 2013-10-17 ENCOUNTER — Encounter: Payer: Self-pay | Admitting: Family Medicine

## 2013-10-17 VITALS — BP 132/84 | HR 89 | Temp 98.1°F | Ht 66.0 in | Wt 250.0 lb

## 2013-10-17 DIAGNOSIS — M7710 Lateral epicondylitis, unspecified elbow: Secondary | ICD-10-CM

## 2013-10-17 MED ORDER — PREDNISONE 10 MG PO TABS: ORAL_TABLET | ORAL | Status: DC

## 2013-10-17 NOTE — Progress Notes (Signed)
   Subjective:    Patient ID: Morgan [name], female    DOB: 08-16-1965, 48 y.o.   MRN: [id number]  HPI Here for left arm pain that started about 3 months ago. It comes and goes but over the past 2 weeks it has gotten worse. She has a burning pain over the left elbow region, and lately it has been getting red and swollen. Aleve and Tramadol do not help.    Review of Systems  Constitutional: Negative.   Musculoskeletal: Positive for arthralgias.  Neurological: Negative.        Objective:   Physical Exam  Constitutional: She appears well-developed and well-nourished.  Musculoskeletal:  She is very tender over the left lateral epicondyle. The area is mildly swollen and red and warm. No rashes. Full ROM          Assessment & Plan:  This is epicondylitis with a lot of regional inflammation. Use ice packs and we will give her a 16 day taper of prednisone. Refer to Orthopedics.

## 2013-10-17 NOTE — Progress Notes (Signed)
Pre visit review using our clinic review tool, if applicable. No additional management support is needed unless otherwise documented below in the visit note. 

## 2013-10-24 ENCOUNTER — Other Ambulatory Visit: Payer: Self-pay | Admitting: Family Medicine

## 2013-10-25 NOTE — Telephone Encounter (Signed)
Pt needs med today °

## 2013-11-18 ENCOUNTER — Other Ambulatory Visit: Payer: Self-pay

## 2013-11-18 ENCOUNTER — Encounter: Payer: Self-pay | Admitting: Family Medicine

## 2013-11-18 ENCOUNTER — Ambulatory Visit
Admission: RE | Admit: 2013-11-18 | Discharge: 2013-11-18 | Disposition: A | Discharge status: Home / Self Care | Payer: BC Managed Care – PPO | Source: Ambulatory Visit

## 2013-11-18 DIAGNOSIS — Z1231 Encounter for screening mammogram for malignant neoplasm of breast: Secondary | ICD-10-CM

## 2013-11-18 DIAGNOSIS — E119 Type 2 diabetes mellitus without complications: Secondary | ICD-10-CM

## 2013-11-18 NOTE — Telephone Encounter (Signed)
Now is the time. I put in orders for FASTING labs

## 2013-11-29 ENCOUNTER — Ambulatory Visit (INDEPENDENT_AMBULATORY_CARE_PROVIDER_SITE_OTHER): Payer: BC Managed Care – PPO | Admitting: Family Medicine

## 2013-11-29 ENCOUNTER — Encounter: Payer: Self-pay | Admitting: Family Medicine

## 2013-11-29 ENCOUNTER — Encounter: Payer: Self-pay | Admitting: Internal Medicine

## 2013-11-29 VITALS — BP 110/84 | HR 80 | Temp 98.8°F | Ht 66.0 in | Wt 248.0 lb

## 2013-11-29 DIAGNOSIS — R1013 Epigastric pain: Secondary | ICD-10-CM

## 2013-11-29 DIAGNOSIS — Z0000 Encounter for general adult medical examination without abnormal findings: Secondary | ICD-10-CM

## 2013-11-29 NOTE — Progress Notes (Signed)
   Subjective:    Patient ID: Morgan [name], female    DOB: 01/25/1966, 48 y.o.   MRN: [id number]  HPI Here for 10 days of epigastric pains, nausea without vomiting, and abdominal bloating. She has had light diarrhea and is passing a lot of flatus. No fevers. Using Miralax and Omeprazole.    Review of Systems  Constitutional: Negative.   Respiratory: Negative.   Cardiovascular: Negative.   Gastrointestinal: Positive for nausea, abdominal pain, diarrhea and abdominal distention. Negative for vomiting, constipation, blood in stool, anal bleeding and rectal pain.       Objective:   Physical Exam  Constitutional: She appears well-developed and well-nourished.  Cardiovascular: Normal rate, regular rhythm, normal heart sounds and intact distal pulses.   Pulmonary/Chest: Effort normal and breath sounds normal.  Abdominal: Soft. Bowel sounds are normal. She exhibits no distension and no mass. There is no rebound and no guarding.  Mild upper abdominal tenderness           Assessment & Plan:  She is overdue for a colonoscopy so we wil set this up. She may have gall bladder disease so we will set up an [place] soon. Avoid fatty foods.

## 2013-11-29 NOTE — Progress Notes (Signed)
Pre visit review using our clinic review tool, if applicable. No additional management support is needed unless otherwise documented below in the visit note. 

## 2013-12-02 ENCOUNTER — Ambulatory Visit
Admission: RE | Admit: 2013-12-02 | Discharge: 2013-12-02 | Disposition: A | Discharge status: Home / Self Care | Payer: BC Managed Care – PPO | Source: Ambulatory Visit | Attending: Family Medicine | Admitting: Family Medicine

## 2013-12-02 DIAGNOSIS — R1013 Epigastric pain: Secondary | ICD-10-CM

## 2014-01-28 DIAGNOSIS — D126 Benign neoplasm of colon, unspecified: Secondary | ICD-10-CM

## 2014-01-28 HISTORY — DX: Benign neoplasm of colon, unspecified: D12.6

## 2014-02-03 ENCOUNTER — Ambulatory Visit (AMBULATORY_SURGERY_CENTER): Payer: Self-pay

## 2014-02-03 VITALS — Ht 65.5 in | Wt 248.0 lb

## 2014-02-03 DIAGNOSIS — Z860100 Personal history of colon polyps, unspecified: Secondary | ICD-10-CM

## 2014-02-03 MED ORDER — MOVIPREP 100 G PO SOLR: 1.0000 | Freq: Once | ORAL | Status: DC

## 2014-02-03 NOTE — Progress Notes (Signed)
No allergies to eggs or soy No home oxygen No diet/weight loss meds No past problems with anesthesia  Has email  Emmi instructions given for colonoscopy 

## 2014-02-06 ENCOUNTER — Encounter: Payer: Self-pay | Admitting: Internal Medicine

## 2014-02-09 ENCOUNTER — Other Ambulatory Visit: Payer: Self-pay | Admitting: Family Medicine

## 2014-02-16 ENCOUNTER — Other Ambulatory Visit (INDEPENDENT_AMBULATORY_CARE_PROVIDER_SITE_OTHER): Payer: BC Managed Care – PPO

## 2014-02-16 DIAGNOSIS — E119 Type 2 diabetes mellitus without complications: Secondary | ICD-10-CM

## 2014-02-16 LAB — LDL CHOLESTEROL, DIRECT: Direct LDL: 101.1 mg/dL

## 2014-02-16 LAB — LIPID PANEL
Cholesterol: 158 mg/dL (ref 0–200)
HDL: 40.9 mg/dL (ref >39.00)
NonHDL: 117.1
Total CHOL/HDL Ratio: 4
Triglycerides: 238 mg/dL — ABNORMAL HIGH (ref 0.0–149.0)
VLDL: 47.6 mg/dL — ABNORMAL HIGH (ref 0.0–40.0)

## 2014-02-16 LAB — HEPATIC FUNCTION PANEL
ALT: 33 U/L (ref 0–35)
AST: 27 U/L (ref 0–37)
Albumin: 3.7 g/dL (ref 3.5–5.2)
Alkaline Phosphatase: 77 U/L (ref 39–117)
Bilirubin, Direct: 0.1 mg/dL (ref 0.0–0.3)
Total Bilirubin: 0.6 mg/dL (ref 0.2–1.2)
Total Protein: 7 g/dL (ref 6.0–8.3)

## 2014-02-16 LAB — HEMOGLOBIN A1C: Hgb A1c MFr Bld: 6.8 % — ABNORMAL HIGH (ref 4.6–6.5)

## 2014-02-17 ENCOUNTER — Encounter: Payer: Self-pay | Admitting: Internal Medicine

## 2014-02-17 ENCOUNTER — Ambulatory Visit (AMBULATORY_SURGERY_CENTER): Payer: BC Managed Care – PPO | Admitting: Internal Medicine

## 2014-02-17 VITALS — BP 122/77 | HR 70 | Temp 97.1°F | Resp 19 | Ht 65.0 in | Wt 248.0 lb

## 2014-02-17 DIAGNOSIS — Z1211 Encounter for screening for malignant neoplasm of colon: Secondary | ICD-10-CM

## 2014-02-17 DIAGNOSIS — D126 Benign neoplasm of colon, unspecified: Secondary | ICD-10-CM

## 2014-02-17 DIAGNOSIS — Z86010 Personal history of colonic polyps: Secondary | ICD-10-CM

## 2014-02-17 HISTORY — PX: COLONOSCOPY: SHX174

## 2014-02-17 LAB — GLUCOSE, CAPILLARY
Glucose-Capillary: 117 mg/dL — ABNORMAL HIGH (ref 70–99)
Glucose-Capillary: 118 mg/dL — ABNORMAL HIGH (ref 70–99)

## 2014-02-17 MED ORDER — SODIUM CHLORIDE 0.9 % IV SOLN: 500.0000 mL | INTRAVENOUS | Status: DC

## 2014-02-17 NOTE — Patient Instructions (Signed)
YOU HAD AN ENDOSCOPIC PROCEDURE TODAY AT [facility]: Refer to the procedure report that was given to you for any specific questions about what was found during the examination.  If the procedure report does not answer your questions, please call your gastroenterologist to clarify.  If you requested that your care partner not be given the details of your procedure findings, then the procedure report has been included in a sealed envelope for you to review at your convenience later.  YOU SHOULD EXPECT: Some feelings of bloating in the abdomen. Passage of more gas than usual.  Walking can help get rid of the air that was put into your GI tract during the procedure and reduce the bloating. If you had a lower endoscopy (such as a colonoscopy or flexible sigmoidoscopy) you may notice spotting of blood in your stool or on the toilet paper. If you underwent a bowel prep for your procedure, then you may not have a normal bowel movement for a few days.  DIET: Your first meal following the procedure should be a light meal and then it is ok to progress to your normal diet.  A half-sandwich or bowl of soup is an example of a good first meal.  Heavy or fried foods are harder to digest and may make you feel nauseous or bloated.  Likewise meals heavy in dairy and vegetables can cause extra gas to form and this can also increase the bloating.  Drink plenty of fluids but you should avoid alcoholic beverages for 24 hours.Increase the fiber in your diet.   Try to limit the fried foods and sweets in your diet.    ACTIVITY: Your care partner should take you home directly after the procedure.  You should plan to take it easy, moving slowly for the rest of the day.  You can resume normal activity the day after the procedure however you should NOT DRIVE or use heavy machinery for 24 hours (because of the sedation medicines used during the test).    SYMPTOMS TO REPORT IMMEDIATELY: A gastroenterologist can be  reached at any hour.  During normal business hours, 8:30 AM to 5:00 PM Monday through Friday, call [phone].  After hours and on weekends, please call the GI answering service at [phone] who will take a message and have the physician on call contact you.   Following lower endoscopy (colonoscopy or flexible sigmoidoscopy):  Excessive amounts of blood in the stool  Significant tenderness or worsening of abdominal pains  Swelling of the abdomen that is new, acute  Fever of 100F or higher  FOLLOW UP: If any biopsies were taken you will be contacted by phone or by letter within the next 1-3 weeks.  Call your gastroenterologist if you have not heard about the biopsies in 3 weeks.  Our staff will call the home number listed on your records the next business day following your procedure to check on you and address any questions or concerns that you may have at that time regarding the information given to you following your procedure. This is a courtesy call and so if there is no answer at the home number and we have not heard from you through the emergency physician on call, we will assume that you have returned to your regular daily activities without incident.  SIGNATURES/CONFIDENTIALITY: You and/or your care partner have signed paperwork which will be entered into your electronic medical record.  These signatures attest to the fact that that the information  above on your After Visit Summary has been reviewed and is understood.  Full responsibility of the confidentiality of this discharge information lies with you and/or your care-partner. 

## 2014-02-17 NOTE — Progress Notes (Signed)
Called to room to assist during endoscopic procedure.  Patient ID and intended procedure confirmed with present staff. Received instructions for my participation in the procedure from the performing physician.  

## 2014-02-17 NOTE — Progress Notes (Signed)
Procedure ends, to recovery, report given and VSS. 

## 2014-02-17 NOTE — Op Note (Signed)
[place]  [address] [place], [id number]   COLONOSCOPY PROCEDURE REPORT  PATIENT: Morgan [name], Morgan [name]  MR#: [id number] BIRTHDATE: Oct 29, 1965 , 48  yrs. old GENDER: Female ENDOSCOPIST: Lafayette Dragon, MD REFERRED VV:[id number] Sarajane Jews, M.D. PROCEDURE DATE:  02/17/2014 PROCEDURE:   Colonoscopy, screening First Screening Colonoscopy - Avg.  risk and is 50 yrs.  old or older - No.  Prior Negative Screening - Now for repeat screening. 10 or more years since last screening  History of Adenoma - Now for follow-up colonoscopy & has been > or = to 3 yrs.  N/A  Polyps Removed Today? Yes. ASA CLASS:   Class II INDICATIONS:prior colonoscopies in 1999 in [place]. MEDICATIONS: MAC sedation, administered by CRNA and Propofol (Diprivan) 330 mg IV  DESCRIPTION OF PROCEDURE:   After the risks benefits and alternatives of the procedure were thoroughly explained, informed consent was obtained.  A digital rectal exam revealed no abnormalities of the rectum.   The LB [id number]  endoscope was introduced through the anus and advanced to the cecum, which was identified by both the appendix and ileocecal valve. No adverse events experienced.   The quality of the prep was good, using MoviPrep  The instrument was then slowly withdrawn as the colon was fully examined.      COLON FINDINGS: Two sessile polyps ranging between 3-79mm in size were found in the descending colon and sigmoid colon.  A polypectomy was performed with cold forceps.  The resection was complete and the polyp tissue was completely retrieved.   Mild diverticulosis was noted in the descending colon.  Retroflexed views revealed no abnormalities. The time to cecum=5 minutes 34 seconds.  Withdrawal time=11 minutes 04 seconds.  The scope was withdrawn and the procedure completed. COMPLICATIONS: There were no complications.  ENDOSCOPIC IMPRESSION: 1.   Two sessile polyps ranging between 3-75mm in size were found  in the descending colon and sigmoid colon; polypectomy was performed with cold forceps 2.   Mild diverticulosis was noted in the descending colon 3.small internal hemorrhoids  RECOMMENDATIONS: 1.  Await pathology results 2.  high fiber diet Recall colonoscopy pending path report   eSigned:  Lafayette Dragon, MD 02/17/2014 9:49 AM   cc:   PATIENT NAME:  Morgan [name], Morgan [name] MR#: [id number]

## 2014-02-20 ENCOUNTER — Telehealth: Payer: Self-pay | Admitting: *Deleted

## 2014-02-20 NOTE — Telephone Encounter (Signed)
  Follow up Call-  Call back number 02/17/2014  Post procedure Call Back phone  # [phone]  Permission to leave phone message Yes     Patient questions:  Do you have a fever, pain , or abdominal swelling? No. Pain Score  0 *  Have you tolerated food without any problems? Yes.    Have you been able to return to your normal activities? Yes.    Do you have any questions about your discharge instructions: Diet   No. Medications  No. Follow up visit  No.  Do you have questions or concerns about your Care? No.  Actions: * If pain score is 4 or above: No action needed, pain <4.

## 2014-02-21 ENCOUNTER — Encounter: Payer: Self-pay | Admitting: Internal Medicine

## 2014-02-22 ENCOUNTER — Telehealth: Payer: Self-pay | Admitting: Family Medicine

## 2014-02-22 ENCOUNTER — Other Ambulatory Visit: Payer: Self-pay | Admitting: Family Medicine

## 2014-02-22 NOTE — Telephone Encounter (Signed)
I sent script e-scribe. 

## 2014-02-22 NOTE — Telephone Encounter (Signed)
I spoke with pt and went over results. 

## 2014-02-22 NOTE — Telephone Encounter (Signed)
Refill request for Metoprolol tartrate 25 mg take 1 po bid and send to [facility].

## 2014-02-22 NOTE — Telephone Encounter (Signed)
Pt would like a call back about lab results ..  °

## 2014-02-23 ENCOUNTER — Encounter: Payer: Self-pay | Admitting: *Deleted

## 2014-04-05 ENCOUNTER — Other Ambulatory Visit: Payer: Self-pay | Admitting: Family Medicine

## 2014-04-22 ENCOUNTER — Other Ambulatory Visit: Payer: Self-pay | Admitting: Family Medicine

## 2014-05-08 ENCOUNTER — Other Ambulatory Visit: Payer: Self-pay | Admitting: Family Medicine

## 2014-05-10 LAB — HM DIABETES EYE EXAM

## 2014-06-12 ENCOUNTER — Encounter: Payer: Self-pay | Admitting: Family Medicine

## 2014-06-12 ENCOUNTER — Ambulatory Visit (INDEPENDENT_AMBULATORY_CARE_PROVIDER_SITE_OTHER): Payer: BC Managed Care – PPO | Admitting: Family Medicine

## 2014-06-12 VITALS — BP 102/77 | HR 74 | Temp 98.3°F | Ht 65.0 in | Wt 243.0 lb

## 2014-06-12 DIAGNOSIS — K589 Irritable bowel syndrome without diarrhea: Secondary | ICD-10-CM

## 2014-06-12 NOTE — Progress Notes (Signed)
   Subjective:    Patient ID: Shanon Payor, female    DOB: [birth year], 48 y.o.   MRN: [id number]  HPI Here to discuss bowel issues. She has alternating BMs which can be loose or hard, but she tends to be constipated. She avoids fatty foods. She uses Miralax sporadically and she has stopped taking Electronics engineer. She does not drink much fluid.    Review of Systems  Constitutional: Negative.   Respiratory: Negative.   Cardiovascular: Negative.   Gastrointestinal: Positive for constipation and abdominal distention. Negative for nausea, vomiting, abdominal pain, diarrhea, blood in stool, anal bleeding and rectal pain.       Objective:   Physical Exam  Constitutional: She appears well-developed and well-nourished.  Cardiovascular: Normal rate, regular rhythm, normal heart sounds and intact distal pulses.   Pulmonary/Chest: Effort normal and breath sounds normal.  Abdominal: Soft. Bowel sounds are normal. She exhibits no distension. There is no tenderness. There is no rebound and no guarding.          Assessment & Plan:  I encouraged her to use Miralax every day and to take Align daily. Drink plenty of water. Recheck prn

## 2014-06-12 NOTE — Progress Notes (Signed)
Pre visit review using our clinic review tool, if applicable. No additional management support is needed unless otherwise documented below in the visit note. 

## 2014-06-20 ENCOUNTER — Emergency Department (HOSPITAL_BASED_OUTPATIENT_CLINIC_OR_DEPARTMENT_OTHER)
Admission: EM | Admit: 2014-06-20 | Discharge: 2014-06-20 | Disposition: A | Discharge status: Home / Self Care | Payer: BC Managed Care – PPO | Attending: Emergency Medicine | Admitting: Emergency Medicine

## 2014-06-20 ENCOUNTER — Encounter (HOSPITAL_BASED_OUTPATIENT_CLINIC_OR_DEPARTMENT_OTHER): Payer: Self-pay

## 2014-06-20 ENCOUNTER — Emergency Department (HOSPITAL_BASED_OUTPATIENT_CLINIC_OR_DEPARTMENT_OTHER): Payer: BC Managed Care – PPO

## 2014-06-20 DIAGNOSIS — K219 Gastro-esophageal reflux disease without esophagitis: Secondary | ICD-10-CM | POA: Diagnosis not present

## 2014-06-20 DIAGNOSIS — M1990 Unspecified osteoarthritis, unspecified site: Secondary | ICD-10-CM | POA: Insufficient documentation

## 2014-06-20 DIAGNOSIS — I10 Essential (primary) hypertension: Secondary | ICD-10-CM | POA: Diagnosis not present

## 2014-06-20 DIAGNOSIS — E119 Type 2 diabetes mellitus without complications: Secondary | ICD-10-CM | POA: Diagnosis not present

## 2014-06-20 DIAGNOSIS — Z79899 Other long term (current) drug therapy: Secondary | ICD-10-CM | POA: Diagnosis not present

## 2014-06-20 DIAGNOSIS — Z8543 Personal history of malignant neoplasm of ovary: Secondary | ICD-10-CM | POA: Insufficient documentation

## 2014-06-20 DIAGNOSIS — J209 Acute bronchitis, unspecified: Secondary | ICD-10-CM

## 2014-06-20 DIAGNOSIS — Z85828 Personal history of other malignant neoplasm of skin: Secondary | ICD-10-CM | POA: Diagnosis not present

## 2014-06-20 DIAGNOSIS — R05 Cough: Secondary | ICD-10-CM | POA: Diagnosis present

## 2014-06-20 DIAGNOSIS — E785 Hyperlipidemia, unspecified: Secondary | ICD-10-CM | POA: Diagnosis not present

## 2014-06-20 MED ORDER — DEXAMETHASONE 4 MG PO TABS
ORAL_TABLET | ORAL | Status: AC
  Filled 2014-06-20: qty 3

## 2014-06-20 MED ORDER — ALBUTEROL SULFATE HFA 108 (90 BASE) MCG/ACT IN AERS: 1.0000 | INHALATION_SPRAY | Freq: Four times a day (QID) | RESPIRATORY_TRACT | Status: DC | PRN

## 2014-06-20 MED ORDER — ALBUTEROL SULFATE (2.5 MG/3ML) 0.083% IN NEBU
5.0000 mg | INHALATION_SOLUTION | Freq: Once | RESPIRATORY_TRACT | Status: AC
  Administered 2014-06-20: 5 mg via RESPIRATORY_TRACT
  Filled 2014-06-20: qty 6

## 2014-06-20 MED ORDER — DEXAMETHASONE 4 MG PO TABS
10.0000 mg | ORAL_TABLET | Freq: Once | ORAL | Status: AC
  Administered 2014-06-20: 10 mg via ORAL

## 2014-06-20 MED ORDER — AZITHROMYCIN 250 MG PO TABS: 250.0000 mg | ORAL_TABLET | Freq: Every day | ORAL | Status: DC

## 2014-06-20 NOTE — ED Notes (Signed)
Reports cough. Seen at urgent care x 2. Given amoxicillin and phenergan DM with little relief.

## 2014-06-20 NOTE — ED Provider Notes (Signed)
CSN: [id number]     Arrival date & time 06/20/14  1807 History  This chart was scribed for Quintella Reichert, MD by Ladene Artist, ED Scribe. The patient was seen in room MH07/MH07. Patient's care was started at 6:28 PM.  Chief Complaint  Patient presents with  . Cough   The history is provided by the patient. No language interpreter was used.   HPI Comments: Morgan [name] is a 48 y.o. female, with a h/o DM, HTN, melanoma, who presents to the Emergency Department complaining of persistent cough onset 5 days ago. Pt was seen at Urgent Care 2 weeks ago for sneezing and congestion. She was prescribed AMX and Phenergan DM which resolved symptoms. Pt was also prescribed cough syrup last week that she takes very 4 hours with mild relief. Today she reports associated wheezing upon exertion. Pt denies fever, chest pain, SOB, leg swelling, any urinary symptoms. No h/o CHF, heart disease, blood clot, lung disease. 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 She states that her A1C was 6.8 when checked 3-4 months ago. Normal glucose level 115-120. Pt is a nonsmoker.  Past Medical History  Diagnosis Date  . Diabetes mellitus   . Hyperlipidemia   . Hypertension   . Ovarian cancer   . Arthritis   . GERD (gastroesophageal reflux disease)   . Anal fissure     saw Dr. Mercy Riding   . Skin cancer (melanoma)    Past Surgical History  Procedure Laterality Date  . Melanoma excision  1997    upper chest   . Abdominal hysterectomy  1986    BSO  . Hemorrhoid surgery    . Hip fracture surgery    . Basal cell carcinoma excision  10-09-12    lower lip per Dr. Link Snuffer   . Glbs tumor ovary 1986    . Colonoscopy  02-17-14    per Dr. Olevia Perches, adenomatous polyps, repeat in 5 yrs    Family History  Problem Relation Age of Onset  . Arthritis    . Diabetes    . Prostate cancer    . Breast cancer    . Coronary artery disease    . Pancreatic cancer  Neg Hx   . Stomach cancer Neg Hx   . Ovarian cancer Sister   . Colon cancer Paternal Grandfather    History  Substance Use Topics  . Smoking status: Never Smoker   . Smokeless tobacco: Never Used  . Alcohol Use: No   OB History    No data available     Review of Systems  Constitutional: Negative for fever.  HENT: Negative for congestion, rhinorrhea and sneezing.   Respiratory: Positive for cough and wheezing. Negative for shortness of breath.   Cardiovascular: Negative for chest pain and leg swelling.  Genitourinary: Negative.   All other systems reviewed and are negative.  Allergies  Lisinopril  Home Medications   Prior to Admission medications   Medication Sig Start Date End Date Taking? Authorizing Provider  atorvastatin (LIPITOR) 40 MG tablet TAKE 1 TABLET BY MOUTH DAILY 05/09/14   Laurey Morale, MD  calcium carbonate (TUMS - DOSED IN MG ELEMENTAL CALCIUM) 500 MG chewable tablet Chew 2 tablets by mouth daily as needed. For indigestion      Historical Provider, MD  glipiZIDE (GLUCOTROL) 10 MG tablet Take 1 tablet (10 mg total) by mouth 2 (two) times daily before a meal. 08/03/13   Annie Main  Raymon Mutton, MD  glucose blood (FREESTYLE LITE) test strip Use as instructed 07/14/12   Laurey Morale, MD  losartan-hydrochlorothiazide (HYZAAR) 50-12.5 MG per tablet TAKE 1 TABLET BY MOUTH DAILY. 07/25/13   Laurey Morale, MD  metFORMIN (GLUCOPHAGE) 1000 MG tablet Take 1 tablet (1,000 mg total) by mouth 2 (two) times daily with a meal. 07/25/13   Laurey Morale, MD  metoprolol tartrate (LOPRESSOR) 25 MG tablet TAKE 1 TABLET BY MOUTH TWICE A DAY    Laurey Morale, MD  metoprolol tartrate (LOPRESSOR) 25 MG tablet TAKE 1 TABLET BY MOUTH TWICE A DAY Patient not taking: Reported on 06/12/2014 02/22/14   Laurey Morale, MD  omeprazole (PRILOSEC) 20 MG capsule Take 20 mg by mouth daily.    Historical Provider, MD  omeprazole (PRILOSEC) 40 MG capsule TAKE ONE CAPSULE BY MOUTH DAILY Patient not taking: Reported  on 06/12/2014 04/24/14   Laurey Morale, MD  sertraline (ZOLOFT) 50 MG tablet Take 1 tablet (50 mg total) by mouth daily. Patient not taking: Reported on 06/12/2014 07/25/13   Laurey Morale, MD  traMADol (ULTRAM) 50 MG tablet Take 1 tablet (50 mg total) by mouth every 6 (six) hours as needed. Patient not taking: Reported on 06/12/2014 10/11/13   Pamella Pert, MD   Triage Vitals: BP 138/96 mmHg  Pulse 93  Temp(Src) 98.4 F (36.9 C) (Oral)  Resp 16  Ht 5\' 5"  (1.651 m)  Wt 248 lb (112.492 kg)  BMI 41.27 kg/m2  SpO2 99% Physical Exam  Constitutional: She is oriented to person, place, and time. She appears well-developed and well-nourished.  HENT:  Head: Normocephalic and atraumatic.  Cardiovascular: Normal rate and regular rhythm.   No murmur heard. Pulmonary/Chest: Effort normal. No respiratory distress.  Occasional right sided wheezes  Abdominal: Soft. There is no tenderness. There is no rebound and no guarding.  Musculoskeletal: She exhibits no edema or tenderness.  Neurological: She is alert and oriented to person, place, and time.  Skin: Skin is warm and dry.  Psychiatric: She has a normal mood and affect. Her behavior is normal.  Nursing note and vitals reviewed.   ED Course  Procedures (including critical care time) DIAGNOSTIC STUDIES: Oxygen Saturation is 99% on RA, normal by my interpretation.    COORDINATION OF CARE: 6:38 PM-Discussed treatment plan which includes CXR and nebulizer treatment with pt at bedside and pt agreed to plan.   Labs Review Labs Reviewed - No data to display  Imaging Review Dg Chest 2 View  06/20/2014   CLINICAL DATA:  Cough for 2 weeks  EXAM: CHEST  2 VIEW  COMPARISON:  09/13/2013  FINDINGS: Normal heart size. Clear lungs. No pneumothorax. No pleural effusion.  IMPRESSION: No active cardiopulmonary disease.   Electronically Signed   By: Maryclare Bean M.D.   On: 06/20/2014 19:30    EKG Interpretation None      MDM   Final diagnoses:   Acute bronchitis, unspecified organism    Patient here for evaluation of cough. She was treated for bronchitis 3 weeks ago with amoxicillin. Patient's cough has returned. Clinical picture is not consistent with CHF, pneumonia, PE. Consider recurrent bronchitis treating for possible bacterial origin given recurrent symptoms and failure to respond to earlier treatment. Treating with a Z-Pak, one time dose of steroids in ED, albuterol MDI. Patient had partial improvement in her symptoms with nebulizer in the emergency department. Discussed importance of PCP follow-up as well as return precautions.  I personally performed  the services described in this documentation, which was scribed in my presence. The recorded information has been reviewed and is accurate.    Quintella Reichert, MD 06/20/14 [phone]

## 2014-06-20 NOTE — Discharge Instructions (Signed)

## 2014-06-24 ENCOUNTER — Telehealth: Payer: Self-pay

## 2014-06-24 NOTE — Telephone Encounter (Signed)
[facility] - Client [facility] Patient Name: BRADEE COMMON Gender: Female DOB: 1965/08/15 Age: 48 Y 65 M 12 D Return Phone Number: [phone] (Primary) Address: City/State/Zip: [place] Client [facility] - Client Client Site [facility] - Night Physician [place], [place] Type Call Call Type Triage / Clinical Relationship To Patient Self Return Phone Number [phone] (Primary) Chief Complaint Cough Initial Comment caller is being treated for bronchitis, is continuing to have cough PreDisposition Did not know what to do Nurse Assessment Nurse: Burt Ek, RN, Vonna Kotyk Date/Time (Eastern Time): 06/22/2014 7:59:42 PM Confirm and document reason for call. If symptomatic, describe symptoms. ---Caller states diagnosed with bronchitis yesterday, started taking antibiotics yesterday, bad cough for 3 weeks. Has the patient traveled out of the country within the last 30 days? ---No Does the patient require triage? ---Yes Related visit to physician within the last 2 weeks? ---Yes Does the PT have any chronic conditions? (i.e. diabetes, asthma, etc.) ---Yes List chronic conditions. ---HTN High cholesterol Diabetes type 2 Did the patient indicate they were pregnant? ---No Guidelines Guideline Title Affirmed Question Affirmed Notes Nurse Date/Time (Eastern Time) Cough - Acute Non- Productive Cough present > 10 days Burt Ek, RN, Vonna Kotyk 06/22/2014 8:05:21 PM Disp. Time Eilene Ghazi Time) Disposition Final User 06/22/2014 6:41:19 PM Send To Clinical Follow Up [place] 06/22/2014 8:15:58 PM See PCP When Office is Open (within 3 days) Yes Blevins, RN, Aviva Kluver Understands: Yes Disagree/Comply: Comply PLEASE NOTE: All timestamps contained within this report are represented as [place] Standard Time. CONFIDENTIALTY NOTICE: This fax transmission is intended  only for the addressee. It contains information that is legally privileged, confidential or otherwise protected from use or disclosure. If you are not the intended recipient, you are strictly prohibited from reviewing, disclosing, copying using or disseminating any of this information or taking any action in reliance on or regarding this information. If you have received this fax in error, please notify us immediately by telephone so that we can arrange for its return to [place]. Phone: [phone], Toll-Free: [phone], Fax: [phone] Page: 2 of 2 Call Id: [id number] Care Advice Given Per Guideline SEE PCP WITHIN 3 DAYS: You need to be examined within 2 or 3 days. Call your doctor during regular office hours and make an appointment. (Note: if office will be open tomorrow, tell caller to call then, not in 3 days). COUGH DROPS FOR COUGH: * Cough drops can help a lot, especially for mild coughs. They reduce coughing by soothing your irritated throat and removing that tickle sensation in the back of the throat. * Cough drops also have the advantage of portability - you can carry them with you. * Cough drops are available over-the-counter (OTC). HOME REMEDY - HARD CANDY: Hard candy works just as well as a medicineflavored OTC cough drops. COUGH MEDICINES: * OTC COUGH SYRUPS: The most common cough suppressant in OTC cough medications is dextromethorphan. Often the letters 'DM' appear in the name. * HOME REMEDY - HARD CANDY: Hard candy works just as well as medicine-flavored OTC cough drops. Diabetics should use sugar-free candy. * HOME REMEDY - HONEY: This old home remedy has been shown to help decrease coughing at night. The adult dosage is 2 teaspoons (10 ml) at bedtime. Honey should not be given to infants under one year of age. OTC COUGH SYRUP - DEXTROMETHORPHAN: * Cough syrups containing the cough suppressant dextromethorphan (DM) may help decrease your cough. Cough syrups work  best for coughs that  keep you awake at night. They can also sometimes help in the late stages of a respiratory infection when the cough is dry and hacking. They can be used along with cough drops. * Examples: Benylin, Robitussin DM, Vicks 44 Cough Relief * Read the package instructions for dosage, contraindications, and other important information. HUMIDIFIER: If the air is dry, use a humidifier in the bedroom. (Reason: dry air makes coughs worse) AVOID TOBACCO SMOKE: Smoking or being exposed to smoke makes coughs much worse. COUGHING SPELLS: * Drink warm fluids. Inhale warm mist. (Reason: both relax the airway and loosen up the phlegm) * Suck on cough drops or hard candy to coat the irritated throat. CALL BACK IF: * Fever over 103 F (39.4 C) * Difficulty breathing occurs * You become worse. CARE ADVICE given per Cough - Acute Non-Productive (Adult) guideline.

## 2014-06-26 ENCOUNTER — Encounter: Payer: Self-pay | Admitting: Family Medicine

## 2014-06-26 ENCOUNTER — Ambulatory Visit (INDEPENDENT_AMBULATORY_CARE_PROVIDER_SITE_OTHER): Payer: BC Managed Care – PPO | Admitting: Family Medicine

## 2014-06-26 ENCOUNTER — Telehealth: Payer: Self-pay | Admitting: Family Medicine

## 2014-06-26 VITALS — BP 118/80 | HR 91 | Temp 98.3°F | Ht 65.0 in | Wt 243.7 lb

## 2014-06-26 DIAGNOSIS — R059 Cough, unspecified: Secondary | ICD-10-CM

## 2014-06-26 DIAGNOSIS — J069 Acute upper respiratory infection, unspecified: Secondary | ICD-10-CM

## 2014-06-26 DIAGNOSIS — R05 Cough: Secondary | ICD-10-CM

## 2014-06-26 MED ORDER — FLUTICASONE PROPIONATE HFA 44 MCG/ACT IN AERO: 2.0000 | INHALATION_SPRAY | Freq: Two times a day (BID) | RESPIRATORY_TRACT | Status: DC

## 2014-06-26 MED ORDER — BENZONATATE 100 MG PO CAPS: 100.0000 mg | ORAL_CAPSULE | Freq: Two times a day (BID) | ORAL | Status: DC | PRN

## 2014-06-26 NOTE — Progress Notes (Signed)
Pre visit review using our clinic review tool, if applicable. No additional management support is needed unless otherwise documented below in the visit note. 

## 2014-06-26 NOTE — Telephone Encounter (Signed)
Pt is on Dr. Julianne Rice schedule for today, due to Dr. Sarajane Jews being out of the office this week.

## 2014-06-26 NOTE — Progress Notes (Signed)
HPI:  -started: about 10 days (had had another cold several weeks before and this had resolved) -seen in ED 06/20/14 and treated with azithromycin, 1 day steroid pill tx (not shot) only in ED -symptoms: started with nasal congestion and upper resp symptoms, these resolved but has had persistent cough, occ wheezing in nose -denies:fever, SOB, NVD, tooth pain -has tried: nyquil is helping -sick contacts/travel/risks: denies flu exposure, tick exposure or or Ebola risks -Hx of: allergies -reports hx of this happening every year when she gets a cold with prolonged cough after colds   ROS: See pertinent positives and negatives per HPI.  Past Medical History  Diagnosis Date  . Diabetes mellitus   . Hyperlipidemia   . Hypertension   . Ovarian cancer   . Arthritis   . GERD (gastroesophageal reflux disease)   . Anal fissure     saw Dr. Mercy Riding   . Skin cancer (melanoma)     Past Surgical History  Procedure Laterality Date  . Melanoma excision  1997    upper chest   . Abdominal hysterectomy  1986    BSO  . Hemorrhoid surgery    . Hip fracture surgery    . Basal cell carcinoma excision  10-09-12    lower lip per Dr. Link Snuffer   . Glbs tumor ovary 1986    . Colonoscopy  02-17-14    per Dr. Olevia Perches, adenomatous polyps, repeat in 5 yrs     Family History  Problem Relation Age of Onset  . Arthritis    . Diabetes    . Prostate cancer    . Breast cancer    . Coronary artery disease    . Pancreatic cancer Neg Hx   . Stomach cancer Neg Hx   . Ovarian cancer Sister   . Colon cancer Paternal Grandfather     History   Social History  . Marital Status: Single    Spouse Name: N/A    Number of Children: N/A  . Years of Education: N/A   Occupational History  . billing     Social History Main Topics  . Smoking status: Never Smoker   . Smokeless tobacco: Never Used  . Alcohol Use: No  . Drug Use: No  . Sexual Activity: None   Other Topics Concern  . None    Social History Narrative    Current outpatient prescriptions: atorvastatin (LIPITOR) 40 MG tablet, TAKE 1 TABLET BY MOUTH DAILY, Disp: 30 tablet, Rfl: 11;  calcium carbonate (TUMS - DOSED IN MG ELEMENTAL CALCIUM) 500 MG chewable tablet, Chew 2 tablets by mouth daily as needed. For indigestion  , Disp: , Rfl: ;  glipiZIDE (GLUCOTROL) 10 MG tablet, Take 1 tablet (10 mg total) by mouth 2 (two) times daily before a meal., Disp: 180 tablet, Rfl: 1 glucose blood (FREESTYLE LITE) test strip, Use as instructed, Disp: 100 each, Rfl: 3;  losartan-hydrochlorothiazide (HYZAAR) 50-12.5 MG per tablet, TAKE 1 TABLET BY MOUTH DAILY., Disp: 90 tablet, Rfl: 3;  metFORMIN (GLUCOPHAGE) 1000 MG tablet, Take 1 tablet (1,000 mg total) by mouth 2 (two) times daily with a meal., Disp: 180 tablet, Rfl: 3;  metoprolol tartrate (LOPRESSOR) 25 MG tablet, TAKE 1 TABLET BY MOUTH TWICE A DAY, Disp: 60 tablet, Rfl: 3 omeprazole (PRILOSEC) 20 MG capsule, Take 20 mg by mouth daily., Disp: , Rfl: ;  sertraline (ZOLOFT) 50 MG tablet, Take 1 tablet (50 mg total) by mouth daily., Disp: 90 tablet, Rfl: 11;  benzonatate (TESSALON)  100 MG capsule, Take 1 capsule (100 mg total) by mouth 2 (two) times daily as needed for cough., Disp: 20 capsule, Rfl: 0 fluticasone (FLOVENT HFA) 44 MCG/ACT inhaler, Inhale 2 puffs into the lungs 2 (two) times daily., Disp: 1 Inhaler, Rfl: 0  EXAM:  Filed Vitals:   06/26/14 1519  BP: 118/80  Pulse: 91  Temp: 98.3 F (36.8 C)    Body mass index is 40.55 kg/(m^2).  GENERAL: vitals reviewed and listed above, alert, oriented, appears well hydrated and in no acute distress  HEENT: atraumatic, conjunttiva clear, no obvious abnormalities on inspection of external nose and ears, normal appearance of ear canals and TMs, clear nasal congestion, mild post oropharyngeal erythema with PND, no tonsillar edema or exudate, no sinus TTP  NECK: no obvious masses on inspection  LUNGS: clear to auscultation  bilaterally, no wheezes, rales or rhonchi, good air movement  CV: HRRR, no peripheral edema  MS: moves all extremities without noticeable abnormality  PSYCH: pleasant and cooperative, no obvious depression or anxiety  ASSESSMENT AND PLAN:  Discussed the following assessment and plan:  Acute upper respiratory infection - Plan: benzonatate (TESSALON) 100 MG capsule  Cough - Plan: fluticasone (FLOVENT HFA) 44 MCG/ACT inhaler  -given HPI and exam findings today, a serious infection or illness is unlikely. We discussed potential etiologies, with VURI being most likely, and advised supportive care and monitoring. We discussed treatment side effects, likely course, antibiotic misuse, transmission, and signs of developing a serious illness. -likely viral with post viral cough - still within normal time frame for viral URI -discussed options for cough and opted against more oral steroids given diabetic and no SOB or wheezing, but opted for trial temporary inh steroid -she reports can't take codeine cough medication and opted to try tessalon perles -of course, we advised to return or notify a doctor immediately if symptoms worsen or persist or new concerns arise.    Patient Instructions       Morgan [name].

## 2014-06-26 NOTE — Telephone Encounter (Signed)
Dr Maudie Mercury is aware of the information from the pharmacist. I called the pt and informed her of the message below she stated she may just fill the Rx for Morgan [name] as Delsym and Mucinex did not help her.

## 2014-06-26 NOTE — Telephone Encounter (Signed)
I called the pharmacy and per Janett Billow (pharmacist) all inhalers are about the same price and she cannot recommend any alternatives-she did recommend OTC cough medication as Dr Maudie Mercury states as well.

## 2014-06-26 NOTE — Telephone Encounter (Signed)
Advise checking with her pharmacist which inhaled steroid is covered with her insurance. Would try over the counter medication for the cough such as delsum or musinex in place of the tessalon.

## 2014-06-26 NOTE — Telephone Encounter (Signed)
Pt states each of the 2 scripts dr kim prescribed were over $100 ea. Pt needs another rx to replace. fluticasone (FLOVENT HFA) 44 MCG/ACT inhaler benzonatate (TESSALON) 100 MG capsule Pt is at the pharm now.

## 2014-07-10 ENCOUNTER — Ambulatory Visit (INDEPENDENT_AMBULATORY_CARE_PROVIDER_SITE_OTHER): Payer: 59 | Admitting: Family Medicine

## 2014-07-10 ENCOUNTER — Encounter: Payer: Self-pay | Admitting: Family Medicine

## 2014-07-10 VITALS — BP 119/86 | HR 98 | Temp 97.8°F | Ht 65.0 in | Wt 244.0 lb

## 2014-07-10 DIAGNOSIS — J0100 Acute maxillary sinusitis, unspecified: Secondary | ICD-10-CM

## 2014-07-10 MED ORDER — PREDNISONE 10 MG PO TABS: 20.0000 mg | ORAL_TABLET | Freq: Every day | ORAL | Status: DC

## 2014-07-10 MED ORDER — DOXYCYCLINE HYCLATE 100 MG PO CAPS: 100.0000 mg | ORAL_CAPSULE | Freq: Two times a day (BID) | ORAL | Status: AC

## 2014-07-10 NOTE — Progress Notes (Signed)
   Subjective:    Patient ID: Morgan [name], female    DOB: 04-12-1966, 49 y.o.   MRN: [id number]  HPI Here for a continued cough that she has had for 4 weeks. This produces yellos sputum at times. She has been seen in Urgent Care, here, and in the ED. CXR was normal. She has been given Amoxicillin and a Zpack, along with a Ventolin inhaler and Mucinex DM. No fever.    Review of Systems  Constitutional: Negative.   HENT: Negative.   Eyes: Negative.   Respiratory: Positive for cough. Negative for shortness of breath and wheezing.   Cardiovascular: Negative.        Objective:   Physical Exam  Constitutional: She appears well-developed and well-nourished.  HENT:  Right Ear: External ear normal.  Left Ear: External ear normal.  Nose: Nose normal.  Mouth/Throat: Oropharynx is clear and moist.  Eyes: Conjunctivae are normal.  Cardiovascular: Normal rate, regular rhythm, normal heart sounds and intact distal pulses.   Pulmonary/Chest: Effort normal and breath sounds normal.  Lymphadenopathy:    She has no cervical adenopathy.          Assessment & Plan:  This is a partially treated sinusitis. Try Doxycycline and some prednisone.

## 2014-07-10 NOTE — Progress Notes (Signed)
Pre visit review using our clinic review tool, if applicable. No additional management support is needed unless otherwise documented below in the visit note. 

## 2014-07-11 ENCOUNTER — Ambulatory Visit: Payer: Self-pay | Admitting: Family Medicine

## 2014-07-31 ENCOUNTER — Encounter (HOSPITAL_BASED_OUTPATIENT_CLINIC_OR_DEPARTMENT_OTHER): Payer: Self-pay | Admitting: *Deleted

## 2014-07-31 ENCOUNTER — Emergency Department (HOSPITAL_BASED_OUTPATIENT_CLINIC_OR_DEPARTMENT_OTHER)
Admission: EM | Admit: 2014-07-31 | Discharge: 2014-07-31 | Disposition: A | Discharge status: Home / Self Care | Payer: 59 | Attending: Emergency Medicine | Admitting: Emergency Medicine

## 2014-07-31 DIAGNOSIS — E119 Type 2 diabetes mellitus without complications: Secondary | ICD-10-CM | POA: Insufficient documentation

## 2014-07-31 DIAGNOSIS — Z8543 Personal history of malignant neoplasm of ovary: Secondary | ICD-10-CM | POA: Diagnosis not present

## 2014-07-31 DIAGNOSIS — Z7951 Long term (current) use of inhaled steroids: Secondary | ICD-10-CM | POA: Diagnosis not present

## 2014-07-31 DIAGNOSIS — K219 Gastro-esophageal reflux disease without esophagitis: Secondary | ICD-10-CM | POA: Diagnosis not present

## 2014-07-31 DIAGNOSIS — Z79899 Other long term (current) drug therapy: Secondary | ICD-10-CM | POA: Diagnosis not present

## 2014-07-31 DIAGNOSIS — Z85828 Personal history of other malignant neoplasm of skin: Secondary | ICD-10-CM | POA: Insufficient documentation

## 2014-07-31 DIAGNOSIS — M79602 Pain in left arm: Secondary | ICD-10-CM

## 2014-07-31 DIAGNOSIS — I10 Essential (primary) hypertension: Secondary | ICD-10-CM | POA: Insufficient documentation

## 2014-07-31 DIAGNOSIS — Z7952 Long term (current) use of systemic steroids: Secondary | ICD-10-CM | POA: Insufficient documentation

## 2014-07-31 MED ORDER — TRAMADOL HCL 50 MG PO TABS: 50.0000 mg | ORAL_TABLET | Freq: Four times a day (QID) | ORAL | Status: DC | PRN

## 2014-07-31 MED ORDER — HYDROCODONE-ACETAMINOPHEN 5-325 MG PO TABS: 1.0000 | ORAL_TABLET | Freq: Four times a day (QID) | ORAL | Status: DC | PRN

## 2014-07-31 NOTE — ED Notes (Signed)
Pt having left elbow pain, needs referral to orthopedic for cortisone injection.  Pt unable to see pcp until next week.

## 2014-07-31 NOTE — ED Provider Notes (Signed)
CSN: [id number]     Arrival date & time 07/31/14  1405 History   First MD Initiated Contact with Patient 07/31/14 1420     Chief Complaint  Patient presents with  . Arm Pain     (Consider location/radiation/quality/duration/timing/severity/associated sxs/prior Treatment) Patient is a 49 y.o. female presenting with arm pain. The history is provided by the patient.  Arm Pain This is a recurrent problem. The current episode started more than 1 week ago. The problem occurs constantly. The problem has not changed since onset.Pertinent negatives include no chest pain, no abdominal pain, no headaches and no shortness of breath. Nothing aggravates the symptoms. Relieved by: ibuprofen helps. Treatments tried: nsaids. The treatment provided mild relief.    Past Medical History  Diagnosis Date  . Diabetes mellitus   . Hyperlipidemia   . Hypertension   . Ovarian cancer   . Arthritis   . GERD (gastroesophageal reflux disease)   . Anal fissure     saw Dr. Mercy Riding   . Skin cancer (melanoma)    Past Surgical History  Procedure Laterality Date  . Melanoma excision  1997    upper chest   . Abdominal hysterectomy  1986    BSO  . Hemorrhoid surgery    . Hip fracture surgery    . Basal cell carcinoma excision  10-09-12    lower lip per Dr. Link Snuffer   . Glbs tumor ovary 1986    . Colonoscopy  02-17-14    per Dr. Olevia Perches, adenomatous polyps, repeat in 5 yrs    Family History  Problem Relation Age of Onset  . Arthritis    . Diabetes    . Prostate cancer    . Breast cancer    . Coronary artery disease    . Pancreatic cancer Neg Hx   . Stomach cancer Neg Hx   . Ovarian cancer Sister   . Colon cancer Paternal Grandfather    History  Substance Use Topics  . Smoking status: Never Smoker   . Smokeless tobacco: Never Used  . Alcohol Use: No   OB History    No data available     Review of Systems  Constitutional: Negative for fever and fatigue.  HENT: Negative for congestion  and drooling.   Eyes: Negative for pain.  Respiratory: Negative for cough and shortness of breath.   Cardiovascular: Negative for chest pain.  Gastrointestinal: Negative for nausea, vomiting, abdominal pain and diarrhea.  Genitourinary: Negative for dysuria and hematuria.  Musculoskeletal: Negative for back pain, gait problem and neck pain.  Skin: Negative for color change.  Neurological: Negative for dizziness and headaches.  Hematological: Negative for adenopathy.  Psychiatric/Behavioral: Negative for behavioral problems.  All other systems reviewed and are negative.     Allergies  Lisinopril  Home Medications   Prior to Admission medications   Medication Sig Start Date End Date Taking? Authorizing Provider  atorvastatin (LIPITOR) 40 MG tablet TAKE 1 TABLET BY MOUTH DAILY 05/09/14   Laurey Morale, MD  benzonatate (TESSALON) 100 MG capsule Take 1 capsule (100 mg total) by mouth 2 (two) times daily as needed for cough. Patient not taking: Reported on 07/10/2014 06/26/14   Lucretia Kern, DO  calcium carbonate (TUMS - DOSED IN MG ELEMENTAL CALCIUM) 500 MG chewable tablet Chew 2 tablets by mouth daily as needed. For indigestion      Historical Provider, MD  fluticasone (FLOVENT HFA) 44 MCG/ACT inhaler Inhale 2 puffs into the lungs 2 (two) times  daily. Patient not taking: Reported on 07/10/2014 06/26/14   Lucretia Kern, DO  glipiZIDE (GLUCOTROL) 10 MG tablet Take 1 tablet (10 mg total) by mouth 2 (two) times daily before a meal. 08/03/13   Laurey Morale, MD  glucose blood (FREESTYLE LITE) test strip Use as instructed 07/14/12   Laurey Morale, MD  losartan-hydrochlorothiazide (HYZAAR) 50-12.5 MG per tablet TAKE 1 TABLET BY MOUTH DAILY. 07/25/13   Laurey Morale, MD  metFORMIN (GLUCOPHAGE) 1000 MG tablet Take 1 tablet (1,000 mg total) by mouth 2 (two) times daily with a meal. 07/25/13   Laurey Morale, MD  metoprolol tartrate (LOPRESSOR) 25 MG tablet TAKE 1 TABLET BY MOUTH TWICE A DAY    Laurey Morale, MD  omeprazole (PRILOSEC) 20 MG capsule Take 20 mg by mouth daily.    Historical Provider, MD  predniSONE (DELTASONE) 10 MG tablet Take 2 tablets (20 mg total) by mouth daily with breakfast. 07/10/14   Laurey Morale, MD  sertraline (ZOLOFT) 50 MG tablet Take 1 tablet (50 mg total) by mouth daily. Patient not taking: Reported on 07/10/2014 07/25/13   Laurey Morale, MD   BP 130/86 mmHg  Pulse 92  Temp(Src) 98.5 F (36.9 C) (Oral)  Resp 16  SpO2 97% Physical Exam  Constitutional: She is oriented to person, place, and time. She appears well-developed and well-nourished.  HENT:  Head: Normocephalic.  Mouth/Throat: Oropharynx is clear and moist. No oropharyngeal exudate.  Eyes: Conjunctivae and EOM are normal. Pupils are equal, round, and reactive to light.  Neck: Normal range of motion. Neck supple.  Cardiovascular: Normal rate, regular rhythm, normal heart sounds and intact distal pulses.  Exam reveals no gallop and no friction rub.   No murmur heard. Pulmonary/Chest: Effort normal and breath sounds normal. No respiratory distress. She has no wheezes.  Abdominal: Soft. Bowel sounds are normal. There is no tenderness. There is no rebound and no guarding.  Musculoskeletal: Normal range of motion. She exhibits no edema or tenderness.  Normal range of motion of the left shoulder, left elbow, left wrist.  Normal motor skills of the left hand.  2+ pulses in bilateral upper extremities.  Normal strength and sensation in all extremities.  Neurological: She is alert and oriented to person, place, and time.  Skin: Skin is warm and dry.  Psychiatric: She has a normal mood and affect. Her behavior is normal.  Nursing note and vitals reviewed.   ED Course  Procedures (including critical care time) Labs Review Labs Reviewed - No data to display  Imaging Review No results found.   EKG Interpretation None      MDM   Final diagnoses:  Left arm pain    2:36 PM 49 y.o. female  presents with left arm burning pain which began 1-2 weeks ago. She has had similar symptoms in the past related to tennis elbow. She denies any fevers or other associated symptoms. No injury. She was unable to see her orthopedist today. She has plans to follow-up with Dr. Ninfa Linden. She is afebrile and vital signs are unremarkable here. We'll give her stronger pain medicine until she can follow-up with the orthopedist.  2:37 PM:  I have discussed the diagnosis/risks/treatment options with the patient and believe the pt to be eligible for discharge home to follow-up with her orthopedist. We also discussed returning to the ED immediately if new or worsening sx occur. We discussed the sx which are most concerning (e.g., worsening pain, fever, weakness,  numbness) that necessitate immediate return. Medications administered to the patient during their visit and any new prescriptions provided to the patient are listed below.  Medications given during this visit Medications - No data to display  New Prescriptions   TRAMADOL (ULTRAM) 50 MG TABLET    Take 1 tablet (50 mg total) by mouth every 6 (six) hours as needed.       Pamella Pert, MD 07/31/14 [phone]

## 2014-08-03 ENCOUNTER — Encounter: Payer: Self-pay | Admitting: Family Medicine

## 2014-08-03 ENCOUNTER — Ambulatory Visit (INDEPENDENT_AMBULATORY_CARE_PROVIDER_SITE_OTHER): Payer: 59 | Admitting: Family Medicine

## 2014-08-03 VITALS — BP 110/86 | HR 86 | Temp 98.1°F | Ht 65.0 in | Wt 242.0 lb

## 2014-08-03 DIAGNOSIS — M7712 Lateral epicondylitis, left elbow: Secondary | ICD-10-CM

## 2014-08-03 NOTE — Progress Notes (Signed)
   Subjective:    Patient ID: Morgan [name], female    DOB: 08/13/1965, 49 y.o.   MRN: [id number]  HPI Here asking for a referral to see Dr. Felix Ahmadi for left elbow pain. She has longstanding lateral epicondylitis, and he gave her a cortisone shot for this in May 2015. This helped for awhile. Now she is having daily pain again. 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 She went to the ER and they gave her some Tramadol.   Review of Systems  Constitutional: Negative.   Musculoskeletal: Positive for arthralgias.       Objective:   Physical Exam  Constitutional: She appears well-developed and well-nourished.  Musculoskeletal:  Tender over the left lateral epicondyle          Assessment & Plan:  We will put in a referral for her to see Dr. Rush Farmer today

## 2014-08-03 NOTE — Progress Notes (Signed)
Pre visit review using our clinic review tool, if applicable. No additional management support is needed unless otherwise documented below in the visit note. 

## 2014-08-09 ENCOUNTER — Other Ambulatory Visit: Payer: Self-pay | Admitting: Family Medicine

## 2014-08-11 ENCOUNTER — Ambulatory Visit: Payer: Self-pay | Admitting: Family Medicine

## 2014-08-26 ENCOUNTER — Other Ambulatory Visit: Payer: Self-pay | Admitting: Family Medicine

## 2014-11-17 ENCOUNTER — Other Ambulatory Visit: Payer: Self-pay | Admitting: Family Medicine

## 2014-11-30 ENCOUNTER — Ambulatory Visit (INDEPENDENT_AMBULATORY_CARE_PROVIDER_SITE_OTHER): Payer: 59 | Admitting: Family Medicine

## 2014-11-30 ENCOUNTER — Encounter: Payer: Self-pay | Admitting: Family Medicine

## 2014-11-30 VITALS — BP 104/76 | HR 77 | Temp 98.3°F | Ht 65.0 in | Wt 244.0 lb

## 2014-11-30 DIAGNOSIS — K589 Irritable bowel syndrome without diarrhea: Secondary | ICD-10-CM

## 2014-11-30 DIAGNOSIS — E119 Type 2 diabetes mellitus without complications: Secondary | ICD-10-CM | POA: Diagnosis not present

## 2014-11-30 DIAGNOSIS — K5732 Diverticulitis of large intestine without perforation or abscess without bleeding: Secondary | ICD-10-CM | POA: Diagnosis not present

## 2014-11-30 LAB — HEMOGLOBIN A1C: Hgb A1c MFr Bld: 6.6 % — ABNORMAL HIGH (ref 4.6–6.5)

## 2014-11-30 MED ORDER — METRONIDAZOLE 500 MG PO TABS: 500.0000 mg | ORAL_TABLET | Freq: Three times a day (TID) | ORAL | Status: DC

## 2014-11-30 MED ORDER — CIPROFLOXACIN HCL 500 MG PO TABS: 500.0000 mg | ORAL_TABLET | Freq: Two times a day (BID) | ORAL | Status: DC

## 2014-11-30 MED ORDER — DICYCLOMINE HCL 20 MG PO TABS: 20.0000 mg | ORAL_TABLET | Freq: Three times a day (TID) | ORAL | Status: DC | PRN

## 2014-11-30 NOTE — Progress Notes (Signed)
Pre visit review using our clinic review tool, if applicable. No additional management support is needed unless otherwise documented below in the visit note. 

## 2014-11-30 NOTE — Progress Notes (Signed)
   Subjective:    Patient ID: Morgan [name], female    DOB: [birth year], 49 y.o.   MRN: [id number]  HPI Here for 2 days of diarrhea with left flank pain and LLQ pain. No urinary symptoms. No fever. She has been under a lot of stress lately and this always causes her to have cramps and diarrhea. She recently lost her job and she has financial concerns.    Review of Systems  Constitutional: Negative.   Gastrointestinal: Positive for abdominal pain and diarrhea. Negative for nausea, vomiting, constipation, blood in stool, abdominal distention, anal bleeding and rectal pain.  Genitourinary: Negative.        Objective:   Physical Exam  Constitutional: She appears well-developed and well-nourished.  Abdominal: Soft. Bowel sounds are normal. She exhibits no distension and no mass. There is no rebound and no guarding.  Tender in the LUQ and LLQ           Assessment & Plan:  Treat the diverticulitis with Cipro and Flagyl. Use Bentyl prn for the IBS. Check an A1c today

## 2015-02-14 ENCOUNTER — Ambulatory Visit (INDEPENDENT_AMBULATORY_CARE_PROVIDER_SITE_OTHER): Payer: 59 | Admitting: Family Medicine

## 2015-02-14 ENCOUNTER — Encounter: Payer: Self-pay | Admitting: Family Medicine

## 2015-02-14 VITALS — BP 122/80 | HR 103 | Temp 98.3°F | Resp 18 | Ht 65.0 in | Wt 249.8 lb

## 2015-02-14 DIAGNOSIS — M940 Chondrocostal junction syndrome [Tietze]: Secondary | ICD-10-CM | POA: Diagnosis not present

## 2015-02-14 DIAGNOSIS — M79641 Pain in right hand: Secondary | ICD-10-CM | POA: Diagnosis not present

## 2015-02-14 MED ORDER — MELOXICAM 15 MG PO TABS: 15.0000 mg | ORAL_TABLET | Freq: Every day | ORAL | Status: DC

## 2015-02-14 NOTE — Progress Notes (Signed)
   Subjective:    Patient ID: Morgan [name], female    DOB: 1965/11/26, 49 y.o.   MRN: [id number]  HPI Here for intermittent pain and numbness in the right hand which started about a year ago. She feels tingling in the fingers and sometimes drops things. She wears a brace at night and she takes Aleve bid. Also she has had intermittent sharp pains for the past few weeks. It hurts to take a deep breath. No SOB.     Review of Systems  Respiratory: Negative.   Cardiovascular: Positive for chest pain. Negative for palpitations and leg swelling.  Neurological: Positive for weakness and numbness.       Objective:   Physical Exam  Constitutional: She is oriented to person, place, and time. She appears well-developed and well-nourished.  Cardiovascular: Normal rate, regular rhythm, normal heart sounds and intact distal pulses.   Pulmonary/Chest: Effort normal and breath sounds normal. No respiratory distress. She has no wheezes. She has no rales.  Tender along both sternal margins   Musculoskeletal: Normal range of motion. She exhibits no edema or tenderness.  Neurological: She is alert and oriented to person, place, and time. She has normal reflexes. No cranial nerve deficit. She exhibits normal muscle tone. Coordination normal.          Assessment & Plan:  She has costochondritis and we will start her on an NSAID for this. She probably has carpal tunnel syndrome and she will continue to wear the wrist brace at night. Try Meloxicam 15 mg daily. We will set her up for a NCS soon.

## 2015-03-05 ENCOUNTER — Other Ambulatory Visit: Payer: Self-pay | Admitting: Family Medicine

## 2015-05-17 ENCOUNTER — Encounter: Payer: Self-pay | Admitting: Family Medicine

## 2015-05-17 ENCOUNTER — Ambulatory Visit (INDEPENDENT_AMBULATORY_CARE_PROVIDER_SITE_OTHER): Payer: 59 | Admitting: Family Medicine

## 2015-05-17 VITALS — BP 110/78 | HR 80 | Temp 98.1°F | Ht 65.0 in | Wt 249.0 lb

## 2015-05-17 DIAGNOSIS — H00016 Hordeolum externum left eye, unspecified eyelid: Secondary | ICD-10-CM

## 2015-05-17 MED ORDER — TOBRAMYCIN-DEXAMETHASONE 0.3-0.1 % OP SUSP: 2.0000 [drp] | OPHTHALMIC | Status: DC

## 2015-05-17 NOTE — Progress Notes (Signed)
   Subjective:    Patient ID: Morgan [name], female    DOB: Nov 20, 1965, 49 y.o.   MRN: [id number]  HPI Here for 2 days of swelling, redness and itching in the left eye. No URI sx.    Review of Systems  Constitutional: Negative.   HENT: Negative.   Eyes: Positive for discharge, redness and itching. Negative for visual disturbance.  Respiratory: Negative.        Objective:   Physical Exam  Constitutional: She appears well-developed and well-nourished.  HENT:  Right Ear: External ear normal.  Left Ear: External ear normal.  Nose: Nose normal.  Mouth/Throat: Oropharynx is clear and moist.  Eyes: Conjunctivae and EOM are normal. Pupils are equal, round, and reactive to light.  Left upper eyelid is swollen and tender   Neck: Neck supple. No thyromegaly present.  Pulmonary/Chest: Effort normal and breath sounds normal.  Lymphadenopathy:    She has no cervical adenopathy.          Assessment & Plan:  Stye, treat with Tobradex drops and warm compresses

## 2015-05-17 NOTE — Progress Notes (Signed)
Pre visit review using our clinic review tool, if applicable. No additional management support is needed unless otherwise documented below in the visit note. 

## 2015-06-08 ENCOUNTER — Other Ambulatory Visit: Payer: Self-pay | Admitting: Family Medicine

## 2015-06-08 ENCOUNTER — Other Ambulatory Visit: Payer: Self-pay | Admitting: Family

## 2015-07-11 ENCOUNTER — Other Ambulatory Visit: Payer: Self-pay | Admitting: Family Medicine

## 2015-07-23 ENCOUNTER — Other Ambulatory Visit: Payer: Self-pay | Admitting: Family Medicine

## 2015-07-24 ENCOUNTER — Encounter: Payer: Self-pay | Admitting: Family Medicine

## 2015-07-24 ENCOUNTER — Encounter: Payer: Self-pay | Admitting: Gastroenterology

## 2015-07-24 ENCOUNTER — Ambulatory Visit (INDEPENDENT_AMBULATORY_CARE_PROVIDER_SITE_OTHER): Payer: BLUE CROSS/BLUE SHIELD | Admitting: Family Medicine

## 2015-07-24 VITALS — BP 124/89 | HR 80 | Temp 97.9°F | Ht 65.0 in | Wt 245.0 lb

## 2015-07-24 DIAGNOSIS — R1310 Dysphagia, unspecified: Secondary | ICD-10-CM

## 2015-07-24 NOTE — Progress Notes (Signed)
   Subjective:    Patient ID: Morgan [name], female    DOB: 1966-03-10, 50 y.o.   MRN: [id number]  HPI Here for frequent choking and coughing spells that started about 3 months ago. This usually happens during eating or shortly after eating a meal. No SOB or PND or fever. She has GERD and takes Omeprazole daily. This has been very successful at eliminating her heartburn. She denies ever feeling like food stops part way down. She had a normal CXR in Dec 2015.    Review of Systems  Constitutional: Negative.   HENT: Negative.   Eyes: Negative.   Respiratory: Positive for cough and choking. Negative for shortness of breath, wheezing and stridor.   Cardiovascular: Negative.   Gastrointestinal: Negative.        Objective:   Physical Exam  Constitutional: She appears well-developed and well-nourished.  HENT:  Right Ear: External ear normal.  Left Ear: External ear normal.  Nose: Nose normal.  Mouth/Throat: Oropharynx is clear and moist.  Eyes: Conjunctivae are normal.  Neck: Neck supple. No thyromegaly present.  Cardiovascular: Normal rate, regular rhythm, normal heart sounds and intact distal pulses.   Pulmonary/Chest: Effort normal and breath sounds normal.  Abdominal: Soft. Bowel sounds are normal. She exhibits no distension and no mass. There is no tenderness. There is no rebound and no guarding.  Lymphadenopathy:    She has no cervical adenopathy.          Assessment & Plan:  Dysphagia. It sounds like she has an LES problem with reflux, even though she takes a PPI. We will refer to GI to evaluate

## 2015-07-24 NOTE — Progress Notes (Signed)
Pre visit review using our clinic review tool, if applicable. No additional management support is needed unless otherwise documented below in the visit note. 

## 2015-07-27 ENCOUNTER — Encounter: Payer: Self-pay | Admitting: Family Medicine

## 2015-07-27 MED ORDER — GLUCOSE BLOOD VI STRP: ORAL_STRIP | Status: DC

## 2015-07-27 NOTE — Telephone Encounter (Signed)
Script sent for strips, see my chart message.

## 2015-07-30 NOTE — Telephone Encounter (Signed)
I agree that this sounds like an anxiety attack. Let us know if it happens again

## 2015-08-07 ENCOUNTER — Encounter: Payer: Self-pay | Admitting: Family Medicine

## 2015-08-07 ENCOUNTER — Ambulatory Visit (INDEPENDENT_AMBULATORY_CARE_PROVIDER_SITE_OTHER): Payer: BLUE CROSS/BLUE SHIELD | Admitting: Family Medicine

## 2015-08-07 VITALS — BP 128/88 | HR 83 | Temp 98.6°F | Ht 65.0 in | Wt 242.0 lb

## 2015-08-07 DIAGNOSIS — J019 Acute sinusitis, unspecified: Secondary | ICD-10-CM

## 2015-08-07 MED ORDER — DOXYCYCLINE HYCLATE 100 MG PO CAPS: 100.0000 mg | ORAL_CAPSULE | Freq: Two times a day (BID) | ORAL | Status: AC

## 2015-08-07 NOTE — Progress Notes (Signed)
   Subjective:    Patient ID: Morgan [name], female    DOB: 21-Jun-1966, 50 y.o.   MRN: [id number]  HPI Here for 4 days of sinus pressure, blowing green mucus from the nose, and ear fullness. No fever.    Review of Systems  Constitutional: Negative.   HENT: Positive for congestion, ear pain, postnasal drip and sinus pressure. Negative for sore throat.   Eyes: Negative.   Respiratory: Negative.        Objective:   Physical Exam  Constitutional: She appears well-developed and well-nourished.  HENT:  Right Ear: External ear normal.  Left Ear: External ear normal.  Nose: Nose normal.  Mouth/Throat: Oropharynx is clear and moist.  Eyes: Conjunctivae are normal.  Neck: No thyromegaly present.  Pulmonary/Chest: Effort normal and breath sounds normal.  Lymphadenopathy:    She has no cervical adenopathy.          Assessment & Plan:  Sinusitis, treat with Doxycycline and Mucinex.

## 2015-08-07 NOTE — Progress Notes (Signed)
Pre visit review using our clinic review tool, if applicable. No additional management support is needed unless otherwise documented below in the visit note. 

## 2015-08-08 ENCOUNTER — Other Ambulatory Visit: Payer: Self-pay | Admitting: Family

## 2015-08-08 ENCOUNTER — Other Ambulatory Visit: Payer: Self-pay | Admitting: Family Medicine

## 2015-08-16 ENCOUNTER — Encounter: Payer: Self-pay | Admitting: Family Medicine

## 2015-08-16 ENCOUNTER — Other Ambulatory Visit: Payer: Self-pay | Admitting: Family Medicine

## 2015-08-16 MED ORDER — BENZONATATE 200 MG PO CAPS: 200.0000 mg | ORAL_CAPSULE | Freq: Two times a day (BID) | ORAL | Status: DC | PRN

## 2015-08-16 NOTE — Telephone Encounter (Signed)
Pt was seen on 08-07-15 and needs something for cough  send to  [facility] on [address]. Pt can not take hydrocodone cough syrup.

## 2015-08-16 NOTE — Telephone Encounter (Signed)
Rx sent to pharmacy   

## 2015-08-16 NOTE — Telephone Encounter (Signed)
Call in Benzonatate 200 mg bid prn cough, #60 with 2 rf 

## 2015-08-16 NOTE — Telephone Encounter (Signed)
Pt aware rx sent.  

## 2015-08-17 NOTE — Telephone Encounter (Signed)
This was done on 08/16/2015.

## 2015-08-20 ENCOUNTER — Telehealth: Payer: Self-pay

## 2015-08-20 NOTE — Telephone Encounter (Signed)
[facility] Night - Client [facility] Patient Name: Morgan [name] Gender: Female DOB: 04/09/1966 Age: 50 Y 10 M 49 D Return Phone Number: [phone] (Primary) Address: City/State/Zip: [place] [address] Client [facility] Night - Client Client Site [facility] - Night Physician [name] Contact Type Call Who Is Calling Patient / Member / Family / Caregiver Call Type Triage / Clinical Relationship To Patient Self Return Phone Number [phone] (Primary) Chief Complaint Cough Reason for Call Symptomatic / Request for Health Information Initial Comment Caller states she is taking Benzonatate 200 mg and wants to know if she can take claritin with that? She is having allergies and a cough. PreDisposition Did not know what to do Translation No Nurse Assessment Nurse: Harlow Mares, RN, Suanne Marker Date/Time (Eastern Time): 08/19/2015 4:53:28 PM Confirm and document reason for call. If symptomatic, describe symptoms. You must click the next button to save text entered. ---Caller states she is taking Benzonatate 200 mg and wants to know if she can take claritin with that? She is having allergies and a cough. Reports that Benzonatate is a new prescription and she is having a nagging cough. Worse when she eats. She has an appt with GI due to problems with swallowing. She has periods of non stop coughing. She states that the benzonatate is not working. Denies itchy watery eyes. Just the coughing. She is using 2 bags of throat losenges a day. Has the patient traveled out of the country within the last 30 days? ---Not Applicable Does the patient have any new or worsening symptoms? ---Yes Will a triage be completed? ---Yes Related visit to physician within the last 2 weeks? ---Yes Does the PT have any chronic conditions? (i.e. diabetes, asthma, etc.) ---Yes List chronic conditions.  ---diabetes, high chol, hypertension Is the patient pregnant or possibly pregnant? (Ask all females between the ages of 57-55) ---No Is this a behavioral health or substance abuse call? ---No PLEASE NOTE: All timestamps contained within this report are represented as [place] Standard Time. CONFIDENTIALTY NOTICE: This fax transmission is intended only for the addressee. It contains information that is legally privileged, confidential or otherwise protected from use or disclosure. If you are not the intended recipient, you are strictly prohibited from reviewing, disclosing, copying using or disseminating any of this information or taking any action in reliance on or regarding this information. If you have received this fax in error, please notify us immediately by telephone so that we can arrange for its return to [place]. Phone: [phone], Toll-Free: [phone], Fax: [phone] Page: 2 of 2 Call Id: [id number] Guidelines Guideline Title Affirmed Question Affirmed Notes Nurse Date/Time Eilene Ghazi Time) Cough - Chronic Cough lasts > 3 weeks (all triage questions negative) Harlow Mares, RN, Suanne Marker 08/19/2015 5:00:49 PM Disp. Time Eilene Ghazi Time) Disposition Final User 08/19/2015 5:10:15 PM See PCP within 2 Weeks Yes Harlow Mares, RN, Rosalyn Charters Understands: Yes Disagree/Comply: Comply Care Advice Given Per Guideline SEE PCP WITHIN 2 WEEKS: You need an evaluation for this ongoing problem within the next 2 weeks. Call your doctor during regular office hours and make an appointment. COUGH MEDICINES: * OTC COUGH SYRUPS: The most common cough suppressant in OTC cough medications is dextromethorphan. Often the letters 'DM' appear in the name. * OTC COUGH DROPS: Cough drops can help a lot, especially for mild coughs. They reduce coughing by soothing your irritated throat and removing that tickle sensation in the back of the throat. Cough drops also have  the advantage of portability - you can carry them with you. *  HOME REMEDY - HARD CANDY: Hard candy works just as well as medicine-flavored OTC cough drops. Diabetics should use sugarfree candy. * HOME REMEDY - HONEY: This old home remedy has been shown to help decrease coughing at night. The adult dosage is 2 teaspoons (10 ml) at bedtime. Honey should not be given to infants under one year of age. AVOID TOBACCO SMOKE: Smoking or being exposed to smoke makes coughs much worse. COUGHING SPELLS: * Drink warm fluids. Inhale warm mist. (Reason: both relax the airway and loosen up the phlegm) * Suck on cough drops or hard candy to coat the irritated throat. CALL BACK IF: * Difficulty breathing occurs * Fever occurs * You become worse. CARE ADVICE given per Cough - Chronic (Adult) guideline.

## 2015-08-20 NOTE — Telephone Encounter (Signed)
Is there anything else we can recommend or does pt need another office visit?

## 2015-08-21 NOTE — Telephone Encounter (Signed)
I left a voice message with below information. 

## 2015-08-21 NOTE — Telephone Encounter (Signed)
Tell her to stop the Benzonatate and try Claritin instead. Otherwise she will see GI soon

## 2015-08-31 ENCOUNTER — Encounter: Payer: Self-pay | Admitting: Family Medicine

## 2015-08-31 NOTE — Telephone Encounter (Signed)
Looks like a FYI 

## 2015-09-11 ENCOUNTER — Other Ambulatory Visit: Payer: Self-pay | Admitting: Family Medicine

## 2015-09-12 ENCOUNTER — Encounter: Payer: Self-pay | Admitting: Gastroenterology

## 2015-09-12 ENCOUNTER — Ambulatory Visit (INDEPENDENT_AMBULATORY_CARE_PROVIDER_SITE_OTHER): Payer: BLUE CROSS/BLUE SHIELD | Admitting: Gastroenterology

## 2015-09-12 VITALS — BP 108/80 | HR 84 | Ht 65.0 in | Wt 244.0 lb

## 2015-09-12 DIAGNOSIS — R05 Cough: Secondary | ICD-10-CM

## 2015-09-12 DIAGNOSIS — K219 Gastro-esophageal reflux disease without esophagitis: Secondary | ICD-10-CM | POA: Diagnosis not present

## 2015-09-12 DIAGNOSIS — Z86010 Personal history of colonic polyps: Secondary | ICD-10-CM

## 2015-09-12 DIAGNOSIS — R053 Chronic cough: Secondary | ICD-10-CM

## 2015-09-12 MED ORDER — OMEPRAZOLE 40 MG PO CPDR: 40.0000 mg | DELAYED_RELEASE_CAPSULE | Freq: Two times a day (BID) | ORAL | Status: DC

## 2015-09-12 NOTE — Patient Instructions (Addendum)
Increase your omeprazole one tablet by mouth twice daily. A new prescription has been sent to your pharmacy.   You have been scheduled for a Barium Esophogram at [facility] (1st floor of the hospital) on 09/21/15 at 1:30pm. Please arrive 15 minutes prior to your appointment for registration. Make certain not to have anything to eat or drink 3 hours prior to your test. If you need to reschedule for any reason, please contact radiology at [phone] to do so. __________________________________________________________________ A barium swallow is an examination that concentrates on views of the esophagus. This tends to be a double contrast exam (barium and two liquids which, when combined, create a gas to distend the wall of the oesophagus) or single contrast (non-ionic iodine based). The study is usually tailored to your symptoms so a good history is essential. Attention is paid during the study to the form, structure and configuration of the esophagus, looking for functional disorders (such as aspiration, dysphagia, achalasia, motility and reflux) EXAMINATION You may be asked to change into a gown, depending on the type of swallow being performed. A radiologist and radiographer will perform the procedure. The radiologist will advise you of the type of contrast selected for your procedure and direct you during the exam. You will be asked to stand, sit or lie in several different positions and to hold a small amount of fluid in your mouth before being asked to swallow while the imaging is performed .In some instances you may be asked to swallow barium coated marshmallows to assess the motility of a solid food bolus. The exam can be recorded as a digital or video fluoroscopy procedure. POST PROCEDURE It will take 1-2 days for the barium to pass through your system. To facilitate this, it is important, unless otherwise directed, to increase your fluids for the next 24-48hrs and to resume your normal  diet.  This test typically takes about 30 minutes to perform. __________________________________________________________________________________

## 2015-09-12 NOTE — Progress Notes (Signed)
HPI :  50 y/o female here in consultation for chronic cough / gagging symptoms. She has a history of DM, HTN, HLD, ovarian cancer, and melanoma.  Patient here with chronic cough since September at least from what she reports. When she swallows she does not endorse dysphagia. She does endorse significant gagging with certain food intake. She does have coughing when she swallows and has gagging and sense of choking when she swallows. She denies any nasal regurgitation. She denies any odynophagia. She denies any pyrosis. She denies regurgitation. She denies nausea / vomiting after she eats. She thinks softer foods are less likely to produce her symptoms. If she drinks some liquids it will produce coughing and gagging. She thinks certain foods can precipitate her symptoms more than other foods. She also has some coughing when she wakes up in the middle of the night with "white foam" in her mouth, and wakes up coughing / gasping for air. She has been on omeprazole 40mg  q day for history of GERD and pyrosis. She has had dysphagia previously when she was off this medication, and she feels much worse when she stops it. While on omeprazole however she has no obvious dysphagia. She has never had a prior upper endoscopy. No FH of esophageal cancer. No prior tobacco use. She can also has a chronic cough despite not eating anything and can gag sporadically, however this is reliably produced with eating.    Colonoscopy 01/2014 - one small adenoma, one hyperplastic, diverticulosis, due for recall in 2020  Past Medical History  Diagnosis Date  . Diabetes mellitus   . Hyperlipidemia   . Hypertension   . Ovarian cancer ([place])   . Arthritis   . GERD (gastroesophageal reflux disease)   . Anal fissure     saw Dr. Mercy Riding   . Skin cancer (melanoma) ([place])   . Tubular adenoma of colon 01/2014     Past Surgical History  Procedure Laterality Date  . Melanoma excision  1997    upper chest   . Abdominal  hysterectomy  1986    BSO  . Hemorrhoid surgery    . Hip fracture surgery    . Basal cell carcinoma excision  10-09-12    lower lip per Dr. Link Snuffer   . Glbs tumor ovary 1986    . Colonoscopy  02-17-14    per Dr. Olevia Perches, adenomatous polyps, repeat in 5 yrs    Family History  Problem Relation Age of Onset  . Arthritis    . Diabetes    . Prostate cancer    . Breast cancer    . Coronary artery disease    . Pancreatic cancer Neg Hx   . Stomach cancer Neg Hx   . Ovarian cancer Sister   . Colon cancer Paternal Grandfather    Social History  Substance Use Topics  . Smoking status: Never Smoker   . Smokeless tobacco: Never Used  . Alcohol Use: No   Current Outpatient Prescriptions  Medication Sig Dispense Refill  . atorvastatin (LIPITOR) 40 MG tablet TAKE 1 TABLET BY MOUTH DAILY 30 tablet 3  . dicyclomine (BENTYL) 20 MG tablet Take 1 tablet (20 mg total) by mouth every 8 (eight) hours as needed (bowel cramps). 90 tablet 5  . glipiZIDE (GLUCOTROL) 10 MG tablet TAKE 1 TABLET (10 MG TOTAL) BY MOUTH 2 (TWO) TIMES DAILY BEFORE A MEAL. 60 tablet 0  . glucose blood (PRODIGY AUTOCODE TEST) test strip Test once per day and diagnosis  code is E 11.9 100 each 1  . losartan-hydrochlorothiazide (HYZAAR) 50-12.5 MG tablet TAKE 1 TABLET BY MOUTH DAILY. 30 tablet 0  . losartan-hydrochlorothiazide (HYZAAR) 50-12.5 MG tablet TAKE 1 TABLET BY MOUTH DAILY. 30 tablet 0  . meloxicam (MOBIC) 15 MG tablet Take 1 tablet (15 mg total) by mouth daily. 30 tablet 11  . metFORMIN (GLUCOPHAGE) 1000 MG tablet TAKE 1 TABLET (1,000 MG TOTAL) BY MOUTH 2 (TWO) TIMES DAILY WITH A MEAL. 60 tablet 10  . metoprolol tartrate (LOPRESSOR) 25 MG tablet TAKE 1 TABLET BY MOUTH TWICE A DAY 60 tablet 3  . omeprazole (PRILOSEC) 40 MG capsule Take 1 capsule (40 mg total) by mouth 2 (two) times daily. 60 capsule 5   No current facility-administered medications for this visit.   Allergies  Allergen Reactions  . Lisinopril Cough      Review of Systems: All systems reviewed and negative except where noted in HPI.   Lab Results  Component Value Date   WBC 9.5 02/16/2013   HGB 13.0 02/16/2013   HCT 38.2 02/16/2013   MCV 84.3 02/16/2013   PLT 387.0 02/16/2013    Lab Results  Component Value Date   ALT 33 02/16/2014   AST 27 02/16/2014   ALKPHOS 77 02/16/2014   BILITOT 0.6 02/16/2014   Lab Results  Component Value Date   CREATININE 0.8 02/16/2013   BUN 15 02/16/2013   NA 140 02/16/2013   K 3.2* 02/16/2013   CL 103 02/16/2013   CO2 28 02/16/2013     Physical Exam: BP 108/80 mmHg  Pulse 84  Ht 5\' 5"  (1.651 m)  Wt 244 lb (110.678 kg)  BMI 40.60 kg/m2 Constitutional: Pleasant,well-developed, female in no acute distress. HEENT: Normocephalic and atraumatic. Conjunctivae are normal. No scleral icterus. Neck supple.  Cardiovascular: Normal rate, regular rhythm.  Pulmonary/chest: Effort normal and breath sounds normal. No wheezing, rales or rhonchi. Abdominal: Soft, protuberant, nontender. Bowel sounds active throughout. There are no masses palpable. No hepatomegaly. Extremities: trace edema Lymphadenopathy: No cervical adenopathy noted. Neurological: Alert and oriented to person place and time. Skin: Skin is warm and dry. No rashes noted. Psychiatric: Normal mood and affect. Behavior is normal.   ASSESSMENT AND PLAN: 50 y/o female with PMH as outlined above, presenting with chronic cough / gagging. Symptoms as described above. Eating or drinking can reliably cause her symptoms although she has not had a frank aspiration event. She also has coughing at other times when not eating. She has also had more traditional dysphagia while off PPI, however this is not bothering her currently. Based on her description of symptoms, it's quite possible she could have oropharyngeal dysphagia or oropharyngeal pathology causing this, and in this light will start with a barium swallow to assess both oropharynx and the  esophagus in light of her reflux complaints. If there is concern for oropharyngeal dysphagia on this exam will proceed with modified barium swallow. If no evidence of this on barium study may consider EGD and or esophageal manometry / pH testing to more definitely evaluate if reflux is contributing to or causing the cough. In the interim she will maximize her reflux regimen and increase PPI to BID to see if this helps. I will let her know results of barium study with further recommendations. She agreed. Otherwise recall colonoscopy in 2020 for history of polyps.   Houck Cellar, MD [facility] Pager [phone]

## 2015-09-21 ENCOUNTER — Ambulatory Visit (HOSPITAL_COMMUNITY)
Admission: RE | Admit: 2015-09-21 | Discharge: 2015-09-21 | Disposition: A | Discharge status: Home / Self Care | Payer: BLUE CROSS/BLUE SHIELD | Source: Ambulatory Visit | Attending: Gastroenterology | Admitting: Gastroenterology

## 2015-09-21 DIAGNOSIS — K219 Gastro-esophageal reflux disease without esophagitis: Secondary | ICD-10-CM | POA: Insufficient documentation

## 2015-09-21 DIAGNOSIS — R053 Chronic cough: Secondary | ICD-10-CM

## 2015-09-21 DIAGNOSIS — R05 Cough: Secondary | ICD-10-CM | POA: Diagnosis not present

## 2015-09-25 ENCOUNTER — Encounter: Payer: Self-pay | Admitting: Gastroenterology

## 2015-10-06 ENCOUNTER — Other Ambulatory Visit: Payer: Self-pay | Admitting: Family Medicine

## 2015-10-19 ENCOUNTER — Other Ambulatory Visit: Payer: Self-pay | Admitting: Family Medicine

## 2015-10-22 ENCOUNTER — Telehealth: Payer: Self-pay | Admitting: Gastroenterology

## 2015-10-22 DIAGNOSIS — R059 Cough, unspecified: Secondary | ICD-10-CM

## 2015-10-22 DIAGNOSIS — R05 Cough: Secondary | ICD-10-CM

## 2015-10-22 NOTE — Telephone Encounter (Signed)
Did she find any benefit from twice daily PPI when she took this, and is she still taking it? If this did not provide much benefit, it would appear less likely related to reflux. If she had benefit at a high dose it is possible this could be related to reflux. I had previously offered her a 24 HR pH Impedance study to help correlate to reflux symptoms and possible EGD.    However, especially if her cough is not related to eating anymore and she thinks unrelated to reflux, I think a pulmonary referral is okay, we can place it through our office or her PCM, does not matter to me. Thanks

## 2015-10-22 NOTE — Telephone Encounter (Signed)
Spoke with patient and she states she is beginning to cough again. States it is not when she eats now. She can go to church and start coughing. She thinks it may be allergies. She is asking for a referral to pulmonary. Please advise if needs to come for PCP or GI.

## 2015-10-22 NOTE — Telephone Encounter (Signed)
Patient did have benefit from PPI. She continues to take it. She feels the cough she is having now maybe allergy related. Referral made to pulmonary. Scheduled with Dr. Milinda Hirschfeld on 10/24/15 at 9:45 AM.

## 2015-10-24 ENCOUNTER — Telehealth: Payer: Self-pay | Admitting: Pulmonary Disease

## 2015-10-24 ENCOUNTER — Ambulatory Visit (INDEPENDENT_AMBULATORY_CARE_PROVIDER_SITE_OTHER)
Admission: RE | Admit: 2015-10-24 | Discharge: 2015-10-24 | Disposition: A | Discharge status: Home / Self Care | Payer: BLUE CROSS/BLUE SHIELD | Source: Ambulatory Visit | Attending: Pulmonary Disease | Admitting: Pulmonary Disease

## 2015-10-24 ENCOUNTER — Ambulatory Visit (INDEPENDENT_AMBULATORY_CARE_PROVIDER_SITE_OTHER): Payer: BLUE CROSS/BLUE SHIELD | Admitting: Pulmonary Disease

## 2015-10-24 ENCOUNTER — Encounter: Payer: Self-pay | Admitting: Pulmonary Disease

## 2015-10-24 VITALS — BP 128/74 | HR 75 | Ht 66.0 in | Wt 244.0 lb

## 2015-10-24 DIAGNOSIS — R0683 Snoring: Secondary | ICD-10-CM

## 2015-10-24 DIAGNOSIS — K219 Gastro-esophageal reflux disease without esophagitis: Secondary | ICD-10-CM

## 2015-10-24 DIAGNOSIS — R05 Cough: Secondary | ICD-10-CM | POA: Diagnosis not present

## 2015-10-24 DIAGNOSIS — R053 Chronic cough: Secondary | ICD-10-CM

## 2015-10-24 MED ORDER — ALBUTEROL SULFATE HFA 108 (90 BASE) MCG/ACT IN AERS: 2.0000 | INHALATION_SPRAY | Freq: Four times a day (QID) | RESPIRATORY_TRACT | Status: DC | PRN

## 2015-10-24 NOTE — Progress Notes (Signed)
Subjective:    Patient ID: Morgan [name], female    DOB: [birth year], 50 y.o.   MRN: [id number]  HPI She reports she has had a cough since September 2016. She reports she has tried cough suppressants with pills and cough syrup without any help. She will wake up in the middle of the night coughing. She reports that a month ago she has noticed coughing predominantly with eating. She reports that she was started on Prilosec twice daily a month ago and has noticed improved cough with oral intake. She reports she has noticed more coughing that is sporadic occuring at rest and also with talking. She reports cough drops seem to help suppress her cough. She reports her cough is predominantly nonproductive but did have some "yellow, hard" phlegm once on Saturday. She denies any wheezing. She feels that there is something constantly in the back of her throat. 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 She reports she does have associated dyspnea, particularly lately. She denies any sinus congestion, pressure, or post nasal drainage recently but has had in the past. She reports she gets bronchitis on a yearly basis. She reports she has had albuterol inhalers in the past that seem to help her breathing. No chest pressure, tightness or pain. No lower extremity edema. She reports she has no reflux or dyspepsia but does wake up gagging every night with a white film and mucus. She reports she has been told she snored. She had a polysomnogram in the 1990s but never followed up on the result. She reports poor quality of sleep but denies any morning headaches. She reports she feels like she "doesn't get any sleep". She does doze off easily but doesn't nap frequently.   Review of Systems No rashes or bruising. No joint pain, swelling, or stiffness. A pertinent 14 point review of systems is negative  except as per the history of presenting illness.  Allergies  Allergen Reactions  . Lisinopril Cough    Current Outpatient Prescriptions on File Prior to Visit  Medication Sig Dispense Refill  . atorvastatin (LIPITOR) 40 MG tablet TAKE 1 TABLET BY MOUTH DAILY 30 tablet 3  . atorvastatin (LIPITOR) 40 MG tablet TAKE 1 TABLET BY MOUTH DAILY 30 tablet 3  . dicyclomine (BENTYL) 20 MG tablet Take 1 tablet (20 mg total) by mouth every 8 (eight) hours as needed (bowel cramps). 90 tablet 5  . glipiZIDE (GLUCOTROL) 10 MG tablet TAKE 1 TABLET (10 MG TOTAL) BY MOUTH 2 (TWO) TIMES DAILY BEFORE A MEAL. 60 tablet 0  . glucose blood (PRODIGY AUTOCODE TEST) test strip Test once per day and diagnosis code is E 11.9 100 each 1  . losartan-hydrochlorothiazide (HYZAAR) 50-12.5 MG tablet TAKE 1 TABLET BY MOUTH DAILY. 30 tablet 0  . losartan-hydrochlorothiazide (HYZAAR) 50-12.5 MG tablet TAKE 1 TABLET BY MOUTH DAILY. 30 tablet 0  . meloxicam (MOBIC) 15 MG tablet Take 1 tablet (15 mg total) by mouth daily. 30 tablet 11  . metFORMIN (GLUCOPHAGE) 1000 MG tablet TAKE 1 TABLET (1,000 MG TOTAL) BY MOUTH 2 (TWO) TIMES DAILY WITH A MEAL. 60 tablet 10  . metoprolol tartrate (LOPRESSOR) 25 MG tablet TAKE 1 TABLET BY MOUTH TWICE A DAY 60 tablet 3  . omeprazole (PRILOSEC) 40 MG capsule Take 1 capsule (40 mg total) by mouth 2 (two) times daily. 60 capsule 5  No current facility-administered medications on file prior to visit.    Past Medical History  Diagnosis Date  . Diabetes mellitus   . Hyperlipidemia   . Hypertension   . Ovarian cancer ([place])   . Arthritis   . GERD (gastroesophageal reflux disease)   . Anal fissure     saw Dr. Mercy Riding   . Skin cancer (melanoma) ([place])   . Tubular adenoma of colon 01/2014    Past Surgical History  Procedure Laterality Date  . Melanoma excision  1997    upper chest   . Abdominal hysterectomy  1986    BSO  . Hemorrhoid surgery    . Hip fracture surgery    . Basal  cell carcinoma excision  10-09-12    lower lip per Dr. Link Snuffer   . Glbs tumor ovary 1986    . Colonoscopy  02-17-14    per Dr. Olevia Perches, adenomatous polyps, repeat in 5 yrs     Family History  Problem Relation Age of Onset  . Arthritis    . Diabetes    . Prostate cancer    . Breast cancer    . Coronary artery disease    . Pancreatic cancer Neg Hx   . Stomach cancer Neg Hx   . Ovarian cancer Sister   . Colon cancer Paternal Grandfather     Social History   Social History  . Marital Status: Single    Spouse Name: N/A  . Number of Children: N/A  . Years of Education: N/A   Occupational History  . billing     Social History Main Topics  . Smoking status: Never Smoker   . Smokeless tobacco: Never Used  . Alcohol Use: No  . Drug Use: No  . Sexual Activity: Not Asked   Other Topics Concern  . None   Social History Narrative      Objective:   Physical Exam BP 128/74 mmHg  Pulse 75  Ht 5\' 6"  (1.676 m)  Wt 244 lb (110.678 kg)  BMI 39.40 kg/m2  SpO2 95% General:  Awake. Alert. No acute distress. Obese.  Integument:  Warm & dry. No rash on exposed skin. No bruising. Lymphatics:  No appreciated cervical or supraclavicular lymphadenoapthy. HEENT:  Moist mucus membranes. No oral ulcers. No scleral injection or icterus. Minimal nasal turbinate swelling. Mallampati Class II w/ high palate. Neck circumference 16.5 inches. Cardiovascular:  Regular rate. No edema. No appreciable JVD.  Pulmonary:  Good aeration & clear to auscultation bilaterally. Symmetric chest wall expansion. No accessory muscle use. Abdomen: Soft. Normal bowel sounds. Nondistended. Grossly nontender. Musculoskeletal:  Normal bulk and tone. Hand grip strength 5/5 bilaterally. No joint deformity or effusion appreciated. Neurological:  CN 2-12 grossly in tact. No meningismus. Moving all 4 extremities equally. Symmetric brachioradialis deep tendon reflexes. Psychiatric:  Mood and affect congruent. Speech normal  rhythm, rate & tone.   PFT 12/04/11: FVC 2.87 L (77%) FEV1 2.59 L (88%) FEV1/FVC 0.90 FEF 25-75 3.32 L (114%) negative bronchodilator response  METHACHOLINE CHALLENGE (12/04/11): Maximum 7% reduction in FEV1 at level V dilution of methacholine. Normal bronchial hyperactivity.  IMAGING BARIUM SWALLOW (09/21/15): Normal esophageal motility without aspiration. Small sliding type hiatal hernia and episodes of spontaneous gastroesophageal reflux noted. No esophageal mass or stricture.  CARDIAC TTE (08/17/08): LV normal in size with EF 60%. No regional wall motion abnormalities. LA & RA normal in size. RV normal in size and function. No aortic stenosis or regurgitation. No mitral stenosis or regurgitation.  No tricuspid stenosis or regurgitation. No pericardial effusion.    Assessment & Plan:  50 year old female with chronic cough and known underlying reflux with small sliding hiatal hernia. I am highly suspicious about possible underlying asthma despite previous negative methacholine challenge test in 2013. 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 This will need to be investigated further with imaging. Given her previous symptomatic response to albuterol inhalers I feel it's reasonable to try symptomatic control with this medication. I instructed the patient to contact my office if she has any new breathing problems before her next appointment. Additionally, she seems to have significant symptoms that would correlate with sleep apnea and given her obesity and neck circumference I feel it's reasonable for her to undergo a repeat polysomnogram testing.  1. Chronic cough: Checking full pulmonary function testing at follow-up appointment. Checking chest x-ray PA/LAT today. Symptomatically controlled with albuterol inhaler. Patient may require repeat methacholine challenge testing versus CT scan of the chest depending upon  findings. 2. GERD: Patient encouraged to continue Prilosec as prescribed. 3. Snoring: High risk for sleep apnea. Checking polysomnogram. 4. Follow-up: Patient to return to clinic in 4-8 weeks or sooner if needed.  Sonia Baller Ashok Cordia, M.D. [facility] Pager:  [phone] After 3pm or if no response, call [phone] 10:28 AM 10/24/2015

## 2015-10-24 NOTE — Telephone Encounter (Signed)
PFT 12/04/11: FVC 2.7 L (77%) FEV1 2.59 L (88%) FEV1/FVC 0.90 FEF 25-75 3.32 L (114%) negative bronchodilator response  METHACHOLINE CHALLENGE (12/04/11): Maximum 7% reduction in FEV1 at level V dilution of methacholine. Normal bronchial hyperactivity.  IMAGING BARIUM SWALLOW (09/21/15): Normal esophageal motility without aspiration. Small sliding type hiatal hernia and episodes of spontaneous gastroesophageal reflux noted. No esophageal mass or stricture.  CARDIAC TTE (08/17/08): LV normal in size with EF 60%. No regional wall motion abnormalities. LA & RA normal in size. RV normal in size and function. No aortic stenosis or regurgitation. No mitral stenosis or regurgitation. No tricuspid stenosis or regurgitation. No pericardial effusion.

## 2015-10-24 NOTE — Patient Instructions (Addendum)
   Continue taking your Prilosec (omeprazole) as prescribed  We will schedule for a sleep study to check to see if you have sleep apnea.  You will have a breathing test at your next appointment.  Remember to use your albuterol inhaler when you're having coughing spells. 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  I will see you back in 4-8 weeks or sooner if needed.  TESTS ORDERED: 1. Polysomnogram 2. Full pulmonary function testing and follow-up appointment 3. Chest x-ray PA/LAT today

## 2015-10-26 ENCOUNTER — Telehealth: Payer: Self-pay | Admitting: Pulmonary Disease

## 2015-10-26 NOTE — Telephone Encounter (Signed)
Result Notes     Notes Recorded by Glean Hess, CMA on 10/25/2015 at 12:11 PM Left message for patient to call back. ------ Notes Recorded by Javier Glazier, MD on 10/24/2015 at 5:52 PM Please call the patient and let her know I reviewed her CXR. There is nothing abnormal that would point to a cause for her cough. Thank you.   Pt is aware of results. Nothing further was needed.

## 2015-10-28 ENCOUNTER — Telehealth: Payer: Self-pay | Admitting: Family Medicine

## 2015-10-28 ENCOUNTER — Emergency Department (HOSPITAL_COMMUNITY)
Admission: EM | Admit: 2015-10-28 | Discharge: 2015-10-28 | Disposition: A | Discharge status: Home / Self Care | Payer: BLUE CROSS/BLUE SHIELD | Attending: Emergency Medicine | Admitting: Emergency Medicine

## 2015-10-28 ENCOUNTER — Encounter (HOSPITAL_COMMUNITY): Payer: Self-pay | Admitting: Emergency Medicine

## 2015-10-28 DIAGNOSIS — E785 Hyperlipidemia, unspecified: Secondary | ICD-10-CM | POA: Insufficient documentation

## 2015-10-28 DIAGNOSIS — M1990 Unspecified osteoarthritis, unspecified site: Secondary | ICD-10-CM | POA: Diagnosis not present

## 2015-10-28 DIAGNOSIS — R0781 Pleurodynia: Secondary | ICD-10-CM | POA: Diagnosis present

## 2015-10-28 DIAGNOSIS — Z85820 Personal history of malignant melanoma of skin: Secondary | ICD-10-CM | POA: Diagnosis not present

## 2015-10-28 DIAGNOSIS — Z79899 Other long term (current) drug therapy: Secondary | ICD-10-CM | POA: Diagnosis not present

## 2015-10-28 DIAGNOSIS — Z7984 Long term (current) use of oral hypoglycemic drugs: Secondary | ICD-10-CM | POA: Diagnosis not present

## 2015-10-28 DIAGNOSIS — Z7951 Long term (current) use of inhaled steroids: Secondary | ICD-10-CM | POA: Insufficient documentation

## 2015-10-28 DIAGNOSIS — R0789 Other chest pain: Secondary | ICD-10-CM | POA: Insufficient documentation

## 2015-10-28 DIAGNOSIS — I10 Essential (primary) hypertension: Secondary | ICD-10-CM | POA: Diagnosis not present

## 2015-10-28 DIAGNOSIS — Z8543 Personal history of malignant neoplasm of ovary: Secondary | ICD-10-CM | POA: Diagnosis not present

## 2015-10-28 DIAGNOSIS — K219 Gastro-esophageal reflux disease without esophagitis: Secondary | ICD-10-CM | POA: Insufficient documentation

## 2015-10-28 DIAGNOSIS — E119 Type 2 diabetes mellitus without complications: Secondary | ICD-10-CM | POA: Insufficient documentation

## 2015-10-28 DIAGNOSIS — Z86010 Personal history of colonic polyps: Secondary | ICD-10-CM | POA: Insufficient documentation

## 2015-10-28 MED ORDER — HYDROCODONE-ACETAMINOPHEN 5-325 MG PO TABS: 1.0000 | ORAL_TABLET | ORAL | Status: DC | PRN

## 2015-10-28 MED ORDER — KETOROLAC TROMETHAMINE 60 MG/2ML IM SOLN
60.0000 mg | Freq: Once | INTRAMUSCULAR | Status: AC
  Administered 2015-10-28: 60 mg via INTRAMUSCULAR
  Filled 2015-10-28: qty 2

## 2015-10-28 NOTE — ED Provider Notes (Signed)
CSN: [id number]     Arrival date & time 10/28/15  0517 History   First MD Initiated Contact with Patient 10/28/15 [phone]     Chief Complaint  Patient presents with  . left rib pain      (Consider location/radiation/quality/duration/timing/severity/associated sxs/prior Treatment) HPI Comments: Patient presents to the emergency part with complaints of pain in the left lateral rib area. Symptoms have been ongoing for 3 days. Patient denies injury. She has has had a chronic cough. She has seen pulmonology for this and had an x-ray several days ago. She reports no change in the cough. She is not short of breath. She has not had any fever.   Past Medical History  Diagnosis Date  . Diabetes mellitus   . Hyperlipidemia   . Hypertension   . Ovarian cancer ([place])   . Arthritis   . GERD (gastroesophageal reflux disease)   . Anal fissure     saw Dr. Mercy Riding   . Skin cancer (melanoma) ([place])   . Tubular adenoma of colon 01/2014   Past Surgical History  Procedure Laterality Date  . Melanoma excision  1997    upper chest   . Abdominal hysterectomy  1986    BSO  . Hemorrhoid surgery    . Hip fracture surgery    . Basal cell carcinoma excision  10-09-12    lower lip per Dr. Link Snuffer   . Glbs tumor ovary 1986    . Colonoscopy  02-17-14    per Dr. Olevia Perches, adenomatous polyps, repeat in 5 yrs    Family History  Problem Relation Age of Onset  . Arthritis    . Diabetes    . Prostate cancer    . Breast cancer    . Coronary artery disease    . Pancreatic cancer Neg Hx   . Stomach cancer Neg Hx   . Ovarian cancer Sister   . Colon cancer Paternal Grandfather   . Asthma Mother    Social History  Substance Use Topics  . Smoking status: Never Smoker   . Smokeless tobacco: Never Used  . Alcohol Use: No   OB History    No data available     Review of Systems  Respiratory: Positive for cough.   All other systems reviewed and are negative.     Allergies  Lisinopril  Home  Medications   Prior to Admission medications   Medication Sig Start Date End Date Taking? Authorizing Provider  albuterol (PROVENTIL HFA;VENTOLIN HFA) 108 (90 Base) MCG/ACT inhaler Inhale 2 puffs into the lungs every 6 (six) hours as needed for wheezing or shortness of breath. 10/24/15   Javier Glazier, MD  atorvastatin (LIPITOR) 40 MG tablet TAKE 1 TABLET BY MOUTH DAILY 06/08/15   Laurey Morale, MD  atorvastatin (LIPITOR) 40 MG tablet TAKE 1 TABLET BY MOUTH DAILY 10/19/15   Laurey Morale, MD  dicyclomine (BENTYL) 20 MG tablet Take 1 tablet (20 mg total) by mouth every 8 (eight) hours as needed (bowel cramps). 11/30/14   Laurey Morale, MD  glipiZIDE (GLUCOTROL) 10 MG tablet TAKE 1 TABLET (10 MG TOTAL) BY MOUTH 2 (TWO) TIMES DAILY BEFORE A MEAL. 10/08/15   Laurey Morale, MD  glucose blood (PRODIGY AUTOCODE TEST) test strip Test once per day and diagnosis code is E 11.9 07/27/15   Laurey Morale, MD  losartan-hydrochlorothiazide [facility]) 50-12.5 MG tablet TAKE 1 TABLET BY MOUTH DAILY. 08/09/15   Laurey Morale, MD  losartan-hydrochlorothiazide [facility])  50-12.5 MG tablet TAKE 1 TABLET BY MOUTH DAILY. 09/11/15   Laurey Morale, MD  meloxicam (MOBIC) 15 MG tablet Take 1 tablet (15 mg total) by mouth daily. 02/14/15   Laurey Morale, MD  metFORMIN (GLUCOPHAGE) 1000 MG tablet TAKE 1 TABLET (1,000 MG TOTAL) BY MOUTH 2 (TWO) TIMES DAILY WITH A MEAL. 09/11/15   Laurey Morale, MD  metoprolol tartrate (LOPRESSOR) 25 MG tablet TAKE 1 TABLET BY MOUTH TWICE A DAY    Laurey Morale, MD  omeprazole (PRILOSEC) 40 MG capsule Take 1 capsule (40 mg total) by mouth 2 (two) times daily. 09/12/15   Manus Gunning, MD   BP 102/50 mmHg  Pulse 80  Temp(Src) 97.6 F (36.4 C) (Oral)  Resp 18  Ht 5\' 6"  (1.676 m)  Wt 245 lb (111.131 kg)  BMI 39.56 kg/m2  SpO2 99% Physical Exam  Constitutional: She is oriented to person, place, and time. She appears well-developed and well-nourished. No distress.  HENT:  Head: Normocephalic  and atraumatic.  Right Ear: Hearing normal.  Left Ear: Hearing normal.  Nose: Nose normal.  Mouth/Throat: Oropharynx is clear and moist and mucous membranes are normal.  Eyes: Conjunctivae and EOM are normal. Pupils are equal, round, and reactive to light.  Neck: Normal range of motion. Neck supple.  Cardiovascular: Regular rhythm, S1 normal and S2 normal.  Exam reveals no gallop and no friction rub.   No murmur heard. Pulmonary/Chest: Effort normal and breath sounds normal. No respiratory distress. She exhibits tenderness. She exhibits no crepitus.    Abdominal: Soft. Normal appearance and bowel sounds are normal. There is no hepatosplenomegaly. There is no tenderness. There is no rebound, no guarding, no tenderness at McBurney's point and negative Murphy's sign. No hernia.  Musculoskeletal: Normal range of motion.  Neurological: She is alert and oriented to person, place, and time. She has normal strength. No cranial nerve deficit or sensory deficit. Coordination normal. GCS eye subscore is 4. GCS verbal subscore is 5. GCS motor subscore is 6.  Skin: Skin is warm, dry and intact. No rash noted. No cyanosis.  Psychiatric: She has a normal mood and affect. Her speech is normal and behavior is normal. Thought content normal.  Nursing note and vitals reviewed.   ED Course  Procedures (including critical care time) Labs Review Labs Reviewed - No data to display  Imaging Review No results found. I have personally reviewed and evaluated these images and lab results as part of my medical decision-making.   EKG Interpretation None      MDM   Final diagnoses:  None  Chest wall pain  Presents to the emergency department for evaluation of pain in the left lateral rib area. Patient has not had any injury. She has, however, had a chronic cough for which she recently saw pulmonology. She was started on albuterol for this. I did review the x-ray from 3 days ago and there was no evidence of  abnormality. I do not feel she requires repeat imaging. Examination reveals tenderness in the region and the pain is reproduced with movement torso. This consistent with chest wall pain. Patient will therefore be discharged with treatment for chest wall pain and follow-up with primary doctor.    Orpah Greek, MD 10/28/15 [phone]

## 2015-10-28 NOTE — ED Notes (Signed)
Patient c/o left rib pain x3 days. Patient denies injury. Patient states the pain was worse when she didn't have a bowel movement for a couple days that when she did have a bowel movement the pain subsided. Patient taking Aleve at home for pain, last dose at 1500.

## 2015-10-28 NOTE — Discharge Instructions (Signed)

## 2015-10-29 NOTE — Telephone Encounter (Signed)
Pt went to ER

## 2015-10-29 NOTE — Telephone Encounter (Signed)
[facility] - Client [facility] Patient Name: Morgan [name] Gender: Female DOB: 05-29-1966 Age: 50 Y 17 D Return Phone Number: [phone] (Primary) Address: City/State/Zip: [place] [address] Client [facility] Night - Client Client Site [facility] - Night Physician [name] - MD Contact Type Call Who Is Calling Patient / Member / Family / Caregiver Call Type Triage / Clinical Relationship To Patient Self Return Phone Number [phone] (Primary) Chief Complaint Back Pain - General Reason for Call Symptomatic / Request for [place] states has back pain PreDisposition Call Doctor Translation No Nurse Assessment Nurse: Wynetta Emery, RN, [name] Date/Time Eilene Ghazi Time): 10/28/2015 4:43:36 AM Confirm and document reason for call. If symptomatic, describe symptoms. You must click the next button to save text entered. ---Morgan [name] has had left side pain in back -- has been having trouble with constipation x 2 days had bm and after had it pain subsided and the next day the back pain came back. (upper flank pain) Has the patient traveled out of the country within the last 30 days? ---No Does the patient have any new or worsening symptoms? ---Yes Will a triage be completed? ---Yes Related visit to physician within the last 2 weeks? ---No Does the PT have any chronic conditions? (i.e. diabetes, asthma, etc.) ---Unknown Is the patient pregnant or possibly pregnant? (Ask all females between the ages of 43-55) ---No Is this a behavioral health or substance abuse call? ---No Guidelines Guideline Title Affirmed Question Affirmed Notes Nurse Date/Time (Eastern Time) Flank Pain [1] SEVERE pain (e.g., excruciating, scale 8-10) AND [2] not improved after pain medicine Wynetta Emery, RN, [name] 10/28/2015 4:48:00 AM Disp. Time Eilene Ghazi Time)  Disposition Final User 10/28/2015 4:51:42 AM Go to ED Now Yes Wynetta Emery, RN, Juleen China NOTE: All timestamps contained within this report are represented as [place] Standard Time. CONFIDENTIALTY NOTICE: This fax transmission is intended only for the addressee. It contains information that is legally privileged, confidential or otherwise protected from use or disclosure. If you are not the intended recipient, you are strictly prohibited from reviewing, disclosing, copying using or disseminating any of this information or taking any action in reliance on or regarding this information. If you have received this fax in error, please notify us immediately by telephone so that we can arrange for its return to [place]. Phone: [phone], Toll-Free: [phone], Fax: [phone] Page: 2 of 2 Call Id: [id number] Caller Understands: Yes Disagree/Comply: Comply Care Advice Given Per Guideline GO TO ED NOW: You need to be seen in the Emergency Department. Go to the ER at ___________ [place] now. Drive carefully. DRIVING: Another adult should drive. * Please bring a list of your current medicines when you go to the Emergency Department (ER). * It is also a good idea to bring the pill bottles too. This will help the doctor to make certain you are taking the right medicines and the right dose. CARE ADVICE given per Flank Pain (Adult) guideline. Referrals [facility] - ED

## 2015-11-09 ENCOUNTER — Other Ambulatory Visit: Payer: Self-pay | Admitting: Family Medicine

## 2015-11-27 ENCOUNTER — Ambulatory Visit: Payer: Self-pay | Admitting: Family Medicine

## 2015-11-28 ENCOUNTER — Encounter: Payer: Self-pay | Admitting: Family Medicine

## 2015-11-28 ENCOUNTER — Ambulatory Visit (INDEPENDENT_AMBULATORY_CARE_PROVIDER_SITE_OTHER): Payer: BLUE CROSS/BLUE SHIELD | Admitting: Family Medicine

## 2015-11-28 VITALS — BP 122/85 | HR 79 | Temp 97.9°F | Ht 66.0 in | Wt 244.0 lb

## 2015-11-28 DIAGNOSIS — I10 Essential (primary) hypertension: Secondary | ICD-10-CM

## 2015-11-28 DIAGNOSIS — E119 Type 2 diabetes mellitus without complications: Secondary | ICD-10-CM

## 2015-11-28 DIAGNOSIS — E785 Hyperlipidemia, unspecified: Secondary | ICD-10-CM | POA: Diagnosis not present

## 2015-11-28 LAB — LIPID PANEL
Cholesterol: 176 mg/dL (ref 0–200)
HDL: 42.1 mg/dL (ref >39.00)
LDL Cholesterol: 97 mg/dL (ref 0–99)
NonHDL: 133.76
Total CHOL/HDL Ratio: 4
Triglycerides: 185 mg/dL — ABNORMAL HIGH (ref 0.0–149.0)
VLDL: 37 mg/dL (ref 0.0–40.0)

## 2015-11-28 LAB — POC URINALSYSI DIPSTICK (AUTOMATED)
Bilirubin, UA: NEGATIVE
Blood, UA: NEGATIVE
Glucose, UA: NEGATIVE
Ketones, UA: NEGATIVE
Leukocytes, UA: NEGATIVE
Nitrite, UA: NEGATIVE
Protein, UA: NEGATIVE
Spec Grav, UA: >=1.03
Urobilinogen, UA: 0.2
pH, UA: 5.5

## 2015-11-28 LAB — HEPATIC FUNCTION PANEL
ALT: 24 U/L (ref 0–35)
AST: 17 U/L (ref 0–37)
Albumin: 4.1 g/dL (ref 3.5–5.2)
Alkaline Phosphatase: 79 U/L (ref 39–117)
Bilirubin, Direct: 0.1 mg/dL (ref 0.0–0.3)
Total Bilirubin: 0.6 mg/dL (ref 0.2–1.2)
Total Protein: 7.1 g/dL (ref 6.0–8.3)

## 2015-11-28 LAB — CBC WITH DIFFERENTIAL/PLATELET
Basophils Absolute: 0 10*3/uL (ref 0.0–0.1)
Basophils Relative: 0.2 % (ref 0.0–3.0)
Eosinophils Absolute: 0.1 10*3/uL (ref 0.0–0.7)
Eosinophils Relative: 0.7 % (ref 0.0–5.0)
HCT: 39.6 % (ref 36.0–46.0)
Hemoglobin: 13.3 g/dL (ref 12.0–15.0)
Lymphocytes Relative: 26.2 % (ref 12.0–46.0)
Lymphs Abs: 2.2 10*3/uL (ref 0.7–4.0)
MCHC: 33.6 g/dL (ref 30.0–36.0)
MCV: 86.1 fl (ref 78.0–100.0)
Monocytes Absolute: 0.3 10*3/uL (ref 0.1–1.0)
Monocytes Relative: 4 % (ref 3.0–12.0)
Neutro Abs: 5.8 10*3/uL (ref 1.4–7.7)
Neutrophils Relative %: 68.9 % (ref 43.0–77.0)
Platelets: 366 10*3/uL (ref 150.0–400.0)
RBC: 4.6 Mil/uL (ref 3.87–5.11)
RDW: 14.8 % (ref 11.5–15.5)
WBC: 8.5 10*3/uL (ref 4.0–10.5)

## 2015-11-28 LAB — BASIC METABOLIC PANEL
BUN: 17 mg/dL (ref 6–23)
CO2: 29 mEq/L (ref 19–32)
Calcium: 9.3 mg/dL (ref 8.4–10.5)
Chloride: 103 mEq/L (ref 96–112)
Creatinine, Ser: 0.75 mg/dL (ref 0.40–1.20)
GFR: 86.2 mL/min (ref >60.00)
Glucose, Bld: 147 mg/dL — ABNORMAL HIGH (ref 70–99)
Potassium: 3.6 mEq/L (ref 3.5–5.1)
Sodium: 140 mEq/L (ref 135–145)

## 2015-11-28 LAB — HEMOGLOBIN A1C: Hgb A1c MFr Bld: 7.1 % — ABNORMAL HIGH (ref 4.6–6.5)

## 2015-11-28 LAB — TSH: TSH: 1.52 u[IU]/mL (ref 0.35–4.50)

## 2015-11-28 MED ORDER — LOSARTAN POTASSIUM-HCTZ 50-12.5 MG PO TABS: 1.0000 | ORAL_TABLET | Freq: Every day | ORAL | Status: DC

## 2015-11-28 MED ORDER — ATORVASTATIN CALCIUM 40 MG PO TABS: 40.0000 mg | ORAL_TABLET | Freq: Every day | ORAL | Status: DC

## 2015-11-28 NOTE — Progress Notes (Signed)
   Subjective:    Patient ID: Shanon Payor, female    DOB: 1965-08-10, 50 y.o.   MRN: [id number]  HPI Here to follow up. She feels well. She does not check her glucoses very often. 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 Apparently she is exposed to something in the air when she does this, because now her chronic cough has totally stopped.    Review of Systems  Constitutional: Negative.   Respiratory: Negative.   Cardiovascular: Negative.   Endocrine: Negative.   Neurological: Negative.        Objective:   Physical Exam  Constitutional: She is oriented to person, place, and time. She appears well-developed and well-nourished.  Neck: No thyromegaly present.  Cardiovascular: Normal rate, regular rhythm, normal heart sounds and intact distal pulses.   Pulmonary/Chest: Effort normal and breath sounds normal.  Lymphadenopathy:    She has no cervical adenopathy.  Neurological: She is alert and oriented to person, place, and time.          Assessment & Plan:  Her HTN is stable. We will check her lipids and diabetic status with fasting labs today.  Laurey Morale, MD

## 2015-11-28 NOTE — Progress Notes (Signed)
Pre visit review using our clinic review tool, if applicable. No additional management support is needed unless otherwise documented below in the visit note. 

## 2015-12-05 ENCOUNTER — Telehealth: Payer: Self-pay | Admitting: Gastroenterology

## 2015-12-05 NOTE — Telephone Encounter (Signed)
Spoke with patient and she states she breaks out in a sweat and feels like she is going to faint with bowel movements. States she is not having diarrhea. She is pushing the stools out. Patient will call her PCP to be seen ?vagal reaction.

## 2015-12-05 NOTE — Telephone Encounter (Signed)
Left a message for patient to call back. 

## 2015-12-05 NOTE — Telephone Encounter (Signed)
This must be a new issue she did not mentioned it to me at our last office visit. She should avoid straining if at all possible, suspect Vagal reaction as you mentioned. If she is straining or constipated she can take a daily fiber supplement or use Miralax PRN.

## 2015-12-06 ENCOUNTER — Encounter: Payer: Self-pay | Admitting: Family Medicine

## 2015-12-06 NOTE — Telephone Encounter (Signed)
Left a message for patient to call back. 

## 2015-12-07 ENCOUNTER — Other Ambulatory Visit: Payer: Self-pay | Admitting: Family Medicine

## 2015-12-07 ENCOUNTER — Encounter: Payer: Self-pay | Admitting: Family Medicine

## 2015-12-07 ENCOUNTER — Telehealth: Payer: Self-pay | Admitting: *Deleted

## 2015-12-07 ENCOUNTER — Other Ambulatory Visit: Payer: Self-pay | Admitting: *Deleted

## 2015-12-07 MED ORDER — ESCITALOPRAM OXALATE 10 MG PO TABS: 10.0000 mg | ORAL_TABLET | Freq: Every day | ORAL | Status: DC

## 2015-12-07 NOTE — Telephone Encounter (Signed)
Rx done and the pt was informed via Mychart message. 

## 2015-12-07 NOTE — Telephone Encounter (Signed)
Left a message on identified voice mail with recommendations for patient.

## 2015-12-07 NOTE — Telephone Encounter (Signed)
Repeat the A1c in 3 months

## 2015-12-07 NOTE — Telephone Encounter (Signed)
I called [facility] and informed the pharmacist [place] the Rx was sent in error on my part and to please cancel this and he agreed.

## 2015-12-10 ENCOUNTER — Telehealth: Payer: Self-pay

## 2015-12-10 NOTE — Telephone Encounter (Signed)
Left message to return call 

## 2015-12-10 NOTE — Telephone Encounter (Signed)
Okay with home care?    Patient Name: Morgan [name] Gender: Female DOB: Mar 21, 1966 Age: 50 Y 86 M 28 D Return Phone Number: [phone] (Primary) Address: City/State/Zip: [place] [address] Client [facility] Night - Client Client Site [facility] - Night Physician [name] - MD Contact Type Call Who Is Calling Patient / Member / Family / Caregiver Call Type Triage / Clinical Relationship To Patient Self Return Phone Number [phone] (Primary) Chief Complaint Blood Sugar High Reason for Call Symptomatic / Request for [place] states her blood sugar is 267, feels tired, taking metformin, wants to know how to bring down PreDisposition Call a family member Translation No Nurse Assessment Nurse: Christel Mormon, RN, Levada Dy Date/Time (Eastern Time): 12/08/2015 3:49:40 PM Confirm and document reason for call. If symptomatic, describe symptoms. You must click the next button to save text entered. ---Caller states her blood sugar is 267, feels tired, taking Metformin Has the patient traveled out of the country within the last 30 days? ---Not Applicable Does the patient have any new or worsening symptoms? ---Yes Will a triage be completed? ---Yes Related visit to physician within the last 2 weeks? ---Yes Does the PT have any chronic conditions? (i.e. diabetes, asthma, etc.) ---Yes List chronic conditions. ---diabetes, Is the patient pregnant or possibly pregnant? (Ask all females between the ages of 58-55) ---No Is this a behavioral health or substance abuse call? ---No Guidelines Guideline Title Affirmed Question Affirmed Notes Nurse Date/Time (Eastern Time) Diabetes - High Blood Sugar [1] Blood glucose > 240 mg/dl (13 mmol/l) AND [2] does not use insulin (e.g., not insulindependent; most type 2 diabetics) (all triage questions negative) [place], RN, Levada Dy 12/08/2015 3:51:57 PM PLEASE NOTE: All  timestamps contained within this report are represented as [place] Standard Time. CONFIDENTIALTY NOTICE: This fax transmission is intended only for the addressee. It contains information that is legally privileged, confidential or otherwise protected from use or disclosure. If you are not the intended recipient, you are strictly prohibited from reviewing, disclosing, copying using or disseminating any of this information or taking any action in reliance on or regarding this information. If you have received this fax in error, please notify us immediately by telephone so that we can arrange for its return to [place]. Phone: [phone], Toll-Free: [phone], Fax: [phone] Page: 2 of 2 Call Id: [id number] [place]. Time Eilene Ghazi Time) Disposition Final User 12/08/2015 4:02:11 PM Home Care Yes Christel Mormon, RN, Marin Shutter Understands: Yes Disagree/Comply: Comply Care Advice Given Per Guideline HOME CARE: You should be able to treat this at home. HIGH BLOOD SUGAR (HYPERGLYCEMIA): * Definition: Fasting blood glucose over 140 mg/dL (7.5 mmol/l) or random blood glucose over 200 mg/dL (11 mmol/l). * Symptoms of mild hyperglycemia: Frequent urination, increased thirst, fatigue, blurred vision. * Symptoms of severe hyperglycemia: Confusion and coma. * Contributing factors: Non-compliance with medications, non-compliance with diet, infection TREATMENT - LIQUIDS: * Drink at least one glass (8 oz; 240 ml) of water per hour for the next 4 hours (Reason: adequate hydration will reduce hyperglycemia). * Generally, you should try to drink 6-8 glasses of water each day. TREATMENT - DIABETES MEDICATIONS: Continue taking your diabetes pills.. MEASURE AND RECORD YOUR BLOOD GLUCOSE: * Measure your blood glucose before breakfast and before going to bed. * Record the results and show them to your doctor at your next office visit. DAILY BLOOD GLUCOSE GOALS: * You and your doctor should decide on what your blood glucose goals  should be. Typical  goals for most nonpregnant adults who perform daily finger-stick blood testing at home shown below. * Pre-prandial (before meal): 70-130 mg/dL (3.9-7.2 mmol/l) * Post-prandial (2-3 hours after a meal): Less than 180 mg/dL (10 mmol/l) * A1C Level less than 7% CHECK FOR KETONES: * If you use insulin, you should have ketone test strips at home. * You should check your ketones when you are sick or your blood glucose is over 240 mg/dL (13 mmol/l). * URINE KETONE TEST: Most people with diabetes use this test. Urine ketone test kits are available at your local pharmacy. * BLOOD KETONE TEST: Some people have special meters that allow them to test for blood ketones. EXPECTED COURSE - You should CALL YOUR DOCTOR WITHIN 1-3 DAYS if: * Your blood sugar continues to get above 240 mg/dl (13 mmol/l). * Your blood sugar continues to be higher than the glucose goals your doctor set for you.It has been longer than 6 months since you had an Hemoglobin A1C test. CALL BACK IF: * Blood glucose over 300 mg/ dL (16.5 mmol/l), two or more times in a row. * Urine ketones become moderate or large (or more than 1+); if you check blood ketones, blood ketone test is 1.6 or higher * Vomiting lasting over 4 hours or unable to drink any fluids * Rapid breathing occurs *

## 2015-12-10 NOTE — Telephone Encounter (Signed)
How long has she had her blood sugar this high?  She needs to force water.   Stay away from carbs and sugars. Continue to retest glucose. If it does not go down she needs to be follow up

## 2015-12-19 ENCOUNTER — Telehealth: Payer: Self-pay | Admitting: Gastroenterology

## 2015-12-19 ENCOUNTER — Encounter: Payer: Self-pay | Admitting: Family Medicine

## 2015-12-19 NOTE — Telephone Encounter (Signed)
Patient states her hemorrhoids are bleeding and bothering her. She is using OTC medications. Suggested she see her PCP now and keep our [place] in August.

## 2015-12-19 NOTE — Telephone Encounter (Signed)
Have her see [place] [facility]

## 2015-12-20 ENCOUNTER — Encounter (HOSPITAL_BASED_OUTPATIENT_CLINIC_OR_DEPARTMENT_OTHER): Payer: Self-pay

## 2015-12-21 ENCOUNTER — Encounter: Payer: Self-pay | Admitting: Family Medicine

## 2015-12-21 ENCOUNTER — Ambulatory Visit (INDEPENDENT_AMBULATORY_CARE_PROVIDER_SITE_OTHER): Payer: BLUE CROSS/BLUE SHIELD | Admitting: Family Medicine

## 2015-12-21 VITALS — BP 108/82 | HR 86 | Temp 98.0°F | Ht 66.0 in | Wt 240.0 lb

## 2015-12-21 DIAGNOSIS — K644 Residual hemorrhoidal skin tags: Secondary | ICD-10-CM

## 2015-12-21 DIAGNOSIS — K648 Other hemorrhoids: Secondary | ICD-10-CM

## 2015-12-21 NOTE — Progress Notes (Signed)
Pre visit review using our clinic review tool, if applicable. No additional management support is needed unless otherwise documented below in the visit note. 

## 2015-12-21 NOTE — Progress Notes (Signed)
   Subjective:    Patient ID: Morgan [name], female    DOB: [birth year], 50 y.o.   MRN: [id number]  HPI Here asking for help with hemorrhoids. They ave been swelling and itchy with some pain over the past 2 weeks. Her BMs are soft and easy to pass since she started using Miralax daily. No bleeding. Using Preparation H.    Review of Systems  Constitutional: Negative.   Gastrointestinal: Positive for rectal pain. Negative for abdominal pain, diarrhea, constipation, blood in stool and anal bleeding.       Objective:   Physical Exam  Constitutional: She appears well-developed and well-nourished.  Abdominal: Soft. Bowel sounds are normal. She exhibits no distension and no mass. There is no tenderness. There is no rebound and no guarding.  Genitourinary:  Several small non-thrombosed external hemorrhoids          Assessment & Plan:  Hemorrhoids, try applying witch hazel for relief. She will see Dr. Havery Moros on 02-21-16.  Laurey Morale, MD

## 2015-12-25 ENCOUNTER — Encounter: Payer: Self-pay | Admitting: Pulmonary Disease

## 2015-12-25 ENCOUNTER — Ambulatory Visit (HOSPITAL_COMMUNITY)
Admission: RE | Admit: 2015-12-25 | Discharge: 2015-12-25 | Disposition: A | Discharge status: Home / Self Care | Payer: BLUE CROSS/BLUE SHIELD | Source: Ambulatory Visit | Attending: Pulmonary Disease | Admitting: Pulmonary Disease

## 2015-12-25 ENCOUNTER — Ambulatory Visit (INDEPENDENT_AMBULATORY_CARE_PROVIDER_SITE_OTHER): Payer: BLUE CROSS/BLUE SHIELD | Admitting: Pulmonary Disease

## 2015-12-25 VITALS — BP 126/74 | HR 82 | Ht 65.0 in | Wt 242.0 lb

## 2015-12-25 DIAGNOSIS — R05 Cough: Secondary | ICD-10-CM | POA: Diagnosis present

## 2015-12-25 DIAGNOSIS — K219 Gastro-esophageal reflux disease without esophagitis: Secondary | ICD-10-CM | POA: Diagnosis not present

## 2015-12-25 DIAGNOSIS — J4520 Mild intermittent asthma, uncomplicated: Secondary | ICD-10-CM

## 2015-12-25 DIAGNOSIS — J45909 Unspecified asthma, uncomplicated: Secondary | ICD-10-CM | POA: Insufficient documentation

## 2015-12-25 DIAGNOSIS — Z579 Occupational exposure to unspecified risk factor: Secondary | ICD-10-CM | POA: Diagnosis not present

## 2015-12-25 DIAGNOSIS — R053 Chronic cough: Secondary | ICD-10-CM

## 2015-12-25 LAB — PULMONARY FUNCTION TEST
DL/VA % pred: 101 %
DL/VA: 4.99 ml/min/mmHg/L
DLCO unc % pred: 75 %
DLCO unc: 19.45 ml/min/mmHg
FEF 25-75 Post: 3.63 L/sec
FEF 25-75 Pre: 3.43 L/sec
FEF2575-%Change-Post: 5 %
FEF2575-%Pred-Post: 133 %
FEF2575-%Pred-Pre: 125 %
FEV1-%Change-Post: 5 %
FEV1-%Pred-Post: 90 %
FEV1-%Pred-Pre: 85 %
FEV1-Post: 2.58 L
FEV1-Pre: 2.44 L
FEV1FVC-%Change-Post: 0 %
FEV1FVC-%Pred-Pre: 113 %
FEV6-%Change-Post: 5 %
FEV6-%Pred-Post: 80 %
FEV6-%Pred-Pre: 76 %
FEV6-Post: 2.84 L
FEV6-Pre: 2.68 L
FEV6FVC-%Pred-Post: 103 %
FEV6FVC-%Pred-Pre: 103 %
FVC-%Change-Post: 5 %
FVC-%Pred-Post: 78 %
FVC-%Pred-Pre: 74 %
FVC-Post: 2.84 L
FVC-Pre: 2.68 L
Post FEV1/FVC ratio: 91 %
Post FEV6/FVC ratio: 100 %
Pre FEV1/FVC ratio: 91 %
Pre FEV6/FVC Ratio: 100 %
RV % pred: 75 %
RV: 1.41 L
TLC % pred: 83 %
TLC: 4.32 L

## 2015-12-25 MED ORDER — ALBUTEROL SULFATE (2.5 MG/3ML) 0.083% IN NEBU
2.5000 mg | INHALATION_SOLUTION | Freq: Once | RESPIRATORY_TRACT | Status: AC
  Administered 2015-12-25: 2.5 mg via RESPIRATORY_TRACT

## 2015-12-25 NOTE — Patient Instructions (Signed)
   Continue using your inhaler.  Let me know if your cough or breathing get worse.  I will see you back in 4-6 months with a breathing test at that time to monitor your lung function.  TESTS ORDERED: 1. Spirometry with bronchodilator challenge & DLCO at next appointment

## 2015-12-25 NOTE — Progress Notes (Signed)
Subjective:    Patient ID: Morgan [name], female    DOB: Jul 29, 1965, 50 y.o.   MRN: [id number]  C.C.:  Follow-up for Chronic Cough/Occupational Asthma, GERD, & Snoring.  HPI Chronic Cough/Occupational Asthma: History of negative methacholine challenge testing in 2013. 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 Denies any nocturnal awakenings with wheezing or cough.  GERD: No reflux, dyspepsia, or morning brash water taste. Continuing Prilosec twice daily.  Snoring: Suspect underlying sleep apnea. Unable to undergo polysomnogram due to cost of testing.  Review of Systems No chest pain, pressure, or tightness. No subjective fever, chills, or sweats. No rashes or abnormal bruising.  Allergies  Allergen Reactions  . Lisinopril Cough    Current Outpatient Prescriptions on File Prior to Visit  Medication Sig Dispense Refill  . albuterol (PROVENTIL HFA;VENTOLIN HFA) 108 (90 Base) MCG/ACT inhaler Inhale 2 puffs into the lungs every 6 (six) hours as needed for wheezing or shortness of breath. 1 Inhaler 6  . atorvastatin (LIPITOR) 40 MG tablet Take 1 tablet (40 mg total) by mouth daily. 30 tablet 11  . dicyclomine (BENTYL) 20 MG tablet Take 1 tablet (20 mg total) by mouth every 8 (eight) hours as needed (bowel cramps). 90 tablet 5  . glipiZIDE (GLUCOTROL) 10 MG tablet TAKE 1 TABLET (10 MG TOTAL) BY MOUTH 2 (TWO) TIMES DAILY BEFORE A MEAL. 60 tablet 5  . glucose blood (PRODIGY AUTOCODE TEST) test strip Test once per day and diagnosis code is E 11.9 100 each 1  . HYDROcodone-acetaminophen (NORCO/VICODIN) 5-325 MG tablet Take 1-2 tablets by mouth every 4 (four) hours as needed for moderate pain. 20 tablet 0  . losartan-hydrochlorothiazide (HYZAAR) 50-12.5 MG tablet Take 1 tablet by mouth daily. 30 tablet 11  . meloxicam (MOBIC) 15 MG tablet Take 1 tablet (15 mg total) by mouth daily. 30 tablet 11  . metFORMIN (GLUCOPHAGE)  1000 MG tablet TAKE 1 TABLET (1,000 MG TOTAL) BY MOUTH 2 (TWO) TIMES DAILY WITH A MEAL. 60 tablet 10  . metoprolol tartrate (LOPRESSOR) 25 MG tablet TAKE 1 TABLET BY MOUTH TWICE DAILY 60 tablet 5  . omeprazole (PRILOSEC) 40 MG capsule Take 1 capsule (40 mg total) by mouth 2 (two) times daily. 60 capsule 5   No current facility-administered medications on file prior to visit.    Past Medical History  Diagnosis Date  . Diabetes mellitus   . Hyperlipidemia   . Hypertension   . Ovarian cancer ([place])   . Arthritis   . GERD (gastroesophageal reflux disease)   . Anal fissure     saw Dr. Mercy Riding   . Skin cancer (melanoma) ([place])   . Tubular adenoma of colon 01/2014    Past Surgical History  Procedure Laterality Date  . Melanoma excision  1997    upper chest   . Abdominal hysterectomy  1986    BSO  . Hemorrhoid surgery    . Hip fracture surgery    . Basal cell carcinoma excision  10-09-12    lower lip per Dr. Link Snuffer   . Glbs tumor ovary 1986    . Colonoscopy  02-17-14    per Dr. Olevia Perches, adenomatous polyps, repeat in 5 yrs     Family History  Problem Relation Age of Onset  . Arthritis    . Diabetes    . Prostate cancer    . Breast cancer    . Coronary artery disease    .  Pancreatic cancer Neg Hx   . Stomach cancer Neg Hx   . Ovarian cancer Sister   . Colon cancer Paternal Grandfather   . Asthma Mother     Social History   Social History  . Marital Status: Single    Spouse Name: N/A  . Number of Children: N/A  . Years of Education: N/A   Occupational History  . billing     Social History Main Topics  . Smoking status: Never Smoker   . Smokeless tobacco: Never Used  . Alcohol Use: No  . Drug Use: No  . Sexual Activity: Not Asked   Other Topics Concern  . None   Social History Narrative   Originally from [place], [place], & moved to [place] at 50 y.o. She reports she started a new job in July 2016. 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 No pets currently. No bird, mold, or hot tub exposure.       Objective:   Physical Exam BP 126/74 mmHg  Pulse 82  Ht 5\' 5"  (1.651 m)  Wt 242 lb (109.77 kg)  BMI 40.27 kg/m2  SpO2 96% General:  Awake. Alert. Morbidly obese. No distress.  Integument:  Warm & dry. No rash on exposed skin.  Lymphatics:  No appreciated cervical or supraclavicular lymphadenoapthy. HEENT:  Moist mucus membranes. No oral ulcers. Minimal nasal turbinate swelling. Cardiovascular:  Regular rate. No edema. Normal S1 & S2. Pulmonary: Clear bilaterally to auscultation. Good aeration bilaterally. Normal work of breathing on room air. Musculoskeletal:  Normal bulk and tone. No joint deformity or effusion appreciated.  PFT 12/25/15: FVC 2.68 L (74%) FEV1 2.44 L (85%) FEV1/FVC 0.91 FEF 25-75 3.43 L (125%) negative bronchodilator response TLC 4.32 L (83%) RV 75% ERV 24% DLCO uncorrected 75% 12/04/11: FVC 2.87 L (77%) FEV1 2.59 L (88%) FEV1/FVC 0.90 FEF 25-75 3.32 L (114%) negative bronchodilator response  METHACHOLINE CHALLENGE (12/04/11): Maximum 7% reduction in FEV1 at level V dilution of methacholine. Normal bronchial hyperactivity.  IMAGING CXR PA/LAT 10/24/15 (personally reviewed by me):  No parenchymal nodule or opacity. No pleural effusion. Heart normal in size & mediastinum normal in contour.   BARIUM SWALLOW (09/21/15): Normal esophageal motility without aspiration. Small sliding type hiatal hernia and episodes of spontaneous gastroesophageal reflux noted. No esophageal mass or stricture.  CARDIAC TTE (08/17/08): LV normal in size with EF 60%. No regional wall motion abnormalities. LA & RA normal in size. RV normal in size and function. No aortic stenosis or regurgitation. No mitral stenosis or regurgitation. No tricuspid stenosis or regurgitation. No pericardial effusion.      Assessment & Plan:  50 year old female with chronic cough and probable underlying occupational asthma. Symptomatically well controlled with premedication with albuterol. Patient's reflux seems to be well-controlled at this time. I reviewed her spirometry which shows no evidence for fixed airway obstruction. Patient had normal lung volumes and carbon monoxide diffusion capacity as well. Also reviewed her chest x-ray performed previously which shows no evidence for parenchymal disease as a cause for her cough. I instructed the patient to contact my office if she has any new breathing problems or questions before her next appointment.   1. 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 Consider inhaled corticosteroid symptoms or spirometry worsen. Repeat spirometry with bronchodilator challenge &  DLCO next appointment.  2. GERD: Continuing Prilosec. No changes.  3. Snoring: High risk for sleep apnea. Unable to afford polysomnogram. 4. Follow-up: Patient to return to clinic in 4-6 months or sooner if needed.   Morgan [name] Ashok Cordia, M.D. [facility] Pager:  [phone] After 3pm or if no response, call [phone] 5:00 PM 12/25/2015

## 2016-02-13 ENCOUNTER — Encounter: Payer: Self-pay | Admitting: Family Medicine

## 2016-02-20 MED ORDER — GLUCOSE BLOOD VI STRP: ORAL_STRIP | 1 refills | Status: DC

## 2016-02-21 ENCOUNTER — Ambulatory Visit: Payer: Self-pay | Admitting: Gastroenterology

## 2016-03-30 ENCOUNTER — Other Ambulatory Visit: Payer: Self-pay | Admitting: Gastroenterology

## 2016-05-17 ENCOUNTER — Other Ambulatory Visit: Payer: Self-pay | Admitting: Family Medicine

## 2016-05-20 ENCOUNTER — Other Ambulatory Visit: Payer: Self-pay | Admitting: Family Medicine

## 2016-05-20 DIAGNOSIS — Z1231 Encounter for screening mammogram for malignant neoplasm of breast: Secondary | ICD-10-CM

## 2016-05-21 ENCOUNTER — Encounter: Payer: Self-pay | Admitting: Family Medicine

## 2016-05-30 ENCOUNTER — Other Ambulatory Visit: Payer: Self-pay

## 2016-05-30 ENCOUNTER — Encounter: Payer: Self-pay | Admitting: Family Medicine

## 2016-05-30 ENCOUNTER — Ambulatory Visit (INDEPENDENT_AMBULATORY_CARE_PROVIDER_SITE_OTHER): Payer: Commercial Managed Care - PPO | Admitting: Family Medicine

## 2016-05-30 VITALS — BP 128/92 | HR 95 | Temp 98.4°F | Ht 65.0 in | Wt 246.8 lb

## 2016-05-30 DIAGNOSIS — I10 Essential (primary) hypertension: Secondary | ICD-10-CM

## 2016-05-30 DIAGNOSIS — E119 Type 2 diabetes mellitus without complications: Secondary | ICD-10-CM

## 2016-05-30 DIAGNOSIS — J019 Acute sinusitis, unspecified: Secondary | ICD-10-CM

## 2016-05-30 LAB — HEMOGLOBIN A1C: Hgb A1c MFr Bld: 6.9 % — ABNORMAL HIGH (ref 4.6–6.5)

## 2016-05-30 MED ORDER — METHYLPREDNISOLONE ACETATE 80 MG/ML IJ SUSP
120.0000 mg | Freq: Once | INTRAMUSCULAR | Status: AC
  Administered 2016-05-30: 120 mg via INTRAMUSCULAR

## 2016-05-30 MED ORDER — METHYLPREDNISOLONE ACETATE 80 MG/ML IJ SUSP: 120.0000 mg | Freq: Once | INTRAMUSCULAR | 0 refills | Status: DC

## 2016-05-30 MED ORDER — AZITHROMYCIN 250 MG PO TABS: ORAL_TABLET | ORAL | 0 refills | Status: DC

## 2016-05-30 NOTE — Progress Notes (Signed)
   Subjective:    Patient ID: Morgan [name], female    DOB: 1965-11-26, 50 y.o.   MRN: [id number]  HPI Here for one week of sinus pressure, PND, and a dry cough.    Review of Systems  Constitutional: Negative.   HENT: Positive for congestion, postnasal drip and sinus pressure. Negative for sinus pain.   Eyes: Negative.   Respiratory: Positive for cough.        Objective:   Physical Exam  Constitutional: She appears well-developed and well-nourished.  HENT:  Right Ear: External ear normal.  Left Ear: External ear normal.  Nose: Nose normal.  Mouth/Throat: Oropharynx is clear and moist.  Eyes: Conjunctivae are normal.  Neck: No thyromegaly present.  Pulmonary/Chest: Effort normal and breath sounds normal.  Lymphadenopathy:    She has no cervical adenopathy.          Assessment & Plan:  Sinusitis, treat with a Zpack and a steroid shot. Check an A1c today.  Laurey Morale, MD

## 2016-05-30 NOTE — Addendum Note (Signed)
Addended by: Lyndle Herrlich on: 05/30/2016 03:35 PM   Modules accepted: Orders

## 2016-05-30 NOTE — Progress Notes (Signed)
Pre visit review using our clinic review tool, if applicable. No additional management support is needed unless otherwise documented below in the visit note. 

## 2016-06-05 ENCOUNTER — Encounter: Payer: Self-pay | Admitting: Family Medicine

## 2016-06-05 NOTE — Telephone Encounter (Signed)
Take Mucinex 1200 mg bid and use Flonase nasal sprays daily as needed

## 2016-06-12 ENCOUNTER — Ambulatory Visit: Payer: Self-pay

## 2016-06-13 ENCOUNTER — Ambulatory Visit
Admission: RE | Admit: 2016-06-13 | Discharge: 2016-06-13 | Disposition: A | Discharge status: Home / Self Care | Payer: Commercial Managed Care - PPO | Source: Ambulatory Visit | Attending: Family Medicine | Admitting: Family Medicine

## 2016-06-13 DIAGNOSIS — Z1231 Encounter for screening mammogram for malignant neoplasm of breast: Secondary | ICD-10-CM

## 2016-06-16 ENCOUNTER — Encounter: Payer: Self-pay | Admitting: Pulmonary Disease

## 2016-07-02 ENCOUNTER — Other Ambulatory Visit: Payer: Self-pay | Admitting: Gastroenterology

## 2016-07-03 ENCOUNTER — Ambulatory Visit (INDEPENDENT_AMBULATORY_CARE_PROVIDER_SITE_OTHER): Payer: Commercial Managed Care - PPO | Admitting: Pulmonary Disease

## 2016-07-03 ENCOUNTER — Encounter: Payer: Self-pay | Admitting: Pulmonary Disease

## 2016-07-03 VITALS — BP 124/86 | HR 99 | Ht 65.0 in | Wt 245.4 lb

## 2016-07-03 DIAGNOSIS — Z579 Occupational exposure to unspecified risk factor: Secondary | ICD-10-CM

## 2016-07-03 DIAGNOSIS — K219 Gastro-esophageal reflux disease without esophagitis: Secondary | ICD-10-CM

## 2016-07-03 DIAGNOSIS — J4520 Mild intermittent asthma, uncomplicated: Secondary | ICD-10-CM

## 2016-07-03 DIAGNOSIS — R0681 Apnea, not elsewhere classified: Secondary | ICD-10-CM

## 2016-07-03 LAB — PULMONARY FUNCTION TEST
DL/VA % pred: 117 %
DL/VA: 5.8 ml/min/mmHg/L
DLCO cor % pred: 87 %
DLCO cor: 22.29 ml/min/mmHg
DLCO unc % pred: 91 %
DLCO unc: 23.32 ml/min/mmHg
FEF 25-75 Post: 3.24 L/sec
FEF 25-75 Pre: 3.82 L/sec
FEF2575-%Change-Post: -15 %
FEF2575-%Pred-Post: 119 %
FEF2575-%Pred-Pre: 140 %
FEV1-%Change-Post: 0 %
FEV1-%Pred-Post: 90 %
FEV1-%Pred-Pre: 89 %
FEV1-Post: 2.57 L
FEV1-Pre: 2.55 L
FEV1FVC-%Change-Post: 1 %
FEV1FVC-%Pred-Pre: 113 %
FEV6-%Change-Post: 0 %
FEV6-%Pred-Post: 79 %
FEV6-%Pred-Pre: 80 %
FEV6-Post: 2.79 L
FEV6-Pre: 2.8 L
FEV6FVC-%Pred-Post: 103 %
FEV6FVC-%Pred-Pre: 103 %
FVC-%Change-Post: 0 %
FVC-%Pred-Post: 77 %
FVC-%Pred-Pre: 77 %
FVC-Post: 2.79 L
FVC-Pre: 2.8 L
Post FEV1/FVC ratio: 92 %
Post FEV6/FVC ratio: 100 %
Pre FEV1/FVC ratio: 91 %
Pre FEV6/FVC Ratio: 100 %

## 2016-07-03 NOTE — Progress Notes (Signed)
Test reviewed.  

## 2016-07-03 NOTE — Progress Notes (Signed)
Subjective:    Patient ID: Morgan [name], female    DOB: [birth year], 51 y.o.   MRN: [id number]  C.C.:  Follow-up for Chronic Cough/Occupational Asthma, GERD, & Snoring.  HPI Chronic Cough/Occupational Asthma: History of negative methacholine challenge testing in 2013. She reports her cough seems to fluctuate. She reports she still feels like the inhaler helps significantly. 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 No exacerbations since last appointment. No wheezing.   GERD: Prescribed Prilosec twice a day. Denies any reflux or dyspepsia. No morning brash water taste.   Snoring: Suspect underlying sleep apnea. Has a history of apneic spells. Unable to undergo polysomnogram due to cost of testing.  Review of Systems No chest pain or pressure. No fever or chills. No headache or vision changes.   Allergies  Allergen Reactions  . Lisinopril Cough    Current Outpatient Prescriptions on File Prior to Visit  Medication Sig Dispense Refill  . albuterol (PROVENTIL HFA;VENTOLIN HFA) 108 (90 Base) MCG/ACT inhaler Inhale 2 puffs into the lungs every 6 (six) hours as needed for wheezing or shortness of breath. 1 Inhaler 6  . atorvastatin (LIPITOR) 40 MG tablet Take 1 tablet (40 mg total) by mouth daily. 30 tablet 11  . dicyclomine (BENTYL) 20 MG tablet Take 1 tablet (20 mg total) by mouth every 8 (eight) hours as needed (bowel cramps). 90 tablet 5  . glipiZIDE (GLUCOTROL) 10 MG tablet TAKE 1 TABLET (10 MG TOTAL) BY MOUTH 2 (TWO) TIMES DAILY BEFORE A MEAL. 60 tablet 5  . glucose blood (PRODIGY AUTOCODE TEST) test strip Test once per day and diagnosis code is E 11.9 100 each 1  . losartan-hydrochlorothiazide (HYZAAR) 50-12.5 MG tablet Take 1 tablet by mouth daily. 30 tablet 11  . meloxicam (MOBIC) 15 MG tablet Take 1 tablet (15 mg total) by mouth daily. 30 tablet 11  . metFORMIN (GLUCOPHAGE) 1000 MG tablet TAKE 1 TABLET (1,000 MG TOTAL) BY MOUTH 2 (TWO) TIMES DAILY WITH A MEAL. 60 tablet 10    . metoprolol tartrate (LOPRESSOR) 25 MG tablet TAKE 1 TABLET BY MOUTH TWICE DAILY 60 tablet 2  . omeprazole (PRILOSEC) 40 MG capsule TAKE ONE CAPSULE BY MOUTH TWICE A DAY 60 capsule 1  . azithromycin (ZITHROMAX) 250 MG tablet As directed (Patient not taking: Reported on 07/03/2016) 6 tablet 0   No current facility-administered medications on file prior to visit.     Past Medical History:  Diagnosis Date  . Anal fissure    saw Dr. Mercy Riding   . Arthritis   . Diabetes mellitus   . GERD (gastroesophageal reflux disease)   . Hyperlipidemia   . Hypertension   . Ovarian cancer ([place])   . Skin cancer (melanoma) ([place])   . Tubular adenoma of colon 01/2014    Past Surgical History:  Procedure Laterality Date  . ABDOMINAL HYSTERECTOMY  1986   BSO  . BASAL CELL CARCINOMA EXCISION  10-09-12   lower lip per Dr. Link Snuffer   . COLONOSCOPY  02-17-14   per Dr. Olevia Perches, adenomatous polyps, repeat in 5 yrs   . GLBS tumor ovary 1986    . HEMORRHOID SURGERY    . HIP FRACTURE SURGERY    . MELANOMA EXCISION  1997   upper chest     Family History  Problem Relation Age of Onset  . Arthritis    . Diabetes    . Prostate cancer    . Breast cancer    . Coronary artery disease    .  Pancreatic cancer Neg Hx   . Stomach cancer Neg Hx   . Ovarian cancer Sister   . Colon cancer Paternal Grandfather   . Asthma Mother     Social History   Social History  . Marital status: Single    Spouse name: N/A  . Number of children: N/A  . 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 Smoking status: Never Smoker  . Smokeless tobacco: Never Used  . Alcohol use No  . Drug use: No  . Sexual activity: Not Asked   Other Topics Concern  . None   Social History Narrative   Originally from [place], [place], & moved to [place] at 51 y.o. She reports she started a new job in July 2016. 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 No pets currently. No bird, mold, or hot tub exposure.       Objective:   Physical Exam BP 124/86 (BP Location: Right Arm, Patient Position: Sitting, Cuff Size: Normal)   Pulse 99   Ht 5\' 5"  (1.651 m)   Wt 245 lb 6.4 oz (111.3 kg)   SpO2 98%   BMI 40.84 kg/m  General:  Awake. Alert. Morbidly obese. No distress.  Integument:  Warm & dry. No rash on exposed skin.  Lymphatics:  No appreciated cervical or supraclavicular lymphadenoapthy. HEENT:  Moist mucus membranes. No oral ulcers. Mallampati class III. Neck circumference 17 inches. Cardiovascular:  Regular rate. Trace edema. Normal S1 & S2. Pulmonary: Good aeration bilaterally. Clear with auscultation. No accessory muscle use on room air. Musculoskeletal:  Normal bulk and tone. No joint deformity or effusion appreciated.  PFT 07/03/16: FVC 2.80 L (77%) FEV1 2.55 L (89%) FEV1/FVC 0.91 FEF 25-75 3.82 L (140%) negative bronchodilator response                                                              DLCO corrected 87% (Hgb 15.0) 12/25/15: FVC 2.68 L (74%) FEV1 2.44 L (85%) FEV1/FVC 0.91 FEF 25-75 3.43 L (125%) negative bronchodilator response TLC 4.32 L (83%) RV 75% ERV 24% DLCO uncorrected 75% 12/04/11: FVC 2.87 L (77%) FEV1 2.59 L (88%) FEV1/FVC 0.90 FEF 25-75 3.32 L (114%) negative bronchodilator response  METHACHOLINE CHALLENGE (12/04/11): Maximum 7% reduction in FEV1 at level V dilution of methacholine. Normal bronchial hyperactivity.  IMAGING CXR PA/LAT 10/24/15 (previously reviewed by me):  No parenchymal nodule or opacity. No pleural effusion. Heart normal in size & mediastinum normal in contour.   BARIUM SWALLOW (09/21/15): Normal esophageal motility without aspiration. Small sliding type hiatal hernia and episodes of spontaneous gastroesophageal reflux noted. No esophageal mass or  stricture.  CARDIAC TTE (08/17/08): LV normal in size with EF 60%. No regional wall motion abnormalities. LA & RA normal in size. RV normal in size and function. No aortic stenosis or regurgitation. No mitral stenosis or regurgitation. No tricuspid stenosis or regurgitation. No pericardial effusion.    Assessment & Plan:  51 y.o. female with  occupational asthma which seems to be well-controlled. Patient spirometry today  has improved since previous testing and continues to show no significant bronchodilator response. As such, I am not adjusting her current inhaler regimen. Her reflux too seems to be well-controlled. I am concerned the patient has underlying sleep apnea and previously was unable to afford testing. She has had witnessed apneas by family now. She is going to investigate the financial feasibility of testing and treatment under her current insurance. I instructed the patient to contact my office if she had any new breathing problems or questions before next appointment.  1. Chronic Cough/Occupational Asthma:  Well-controlled with albuterol inhaler as needed. Repeat spirometry with bronchodilator challenge at next appointment in one year. 2. GERD: Controlled. Continuing Prilosec twice a day. 3. Snoring/Witnessed Apnea:  Patient checking with her current insurance company to determine the affordability of testing. 4. Health Maintenance:  Administering Influenza Vaccine today.  5. Follow-up: Patient to return to clinic in 1 year or sooner if needed.   Sonia Baller Ashok Cordia, M.D. [facility] Pager:  [phone] After 3pm or if no response, call [phone] 4:41 PM 07/03/16

## 2016-07-03 NOTE — Patient Instructions (Signed)
   Continue using your medication and inhaler as prescribed.  Call me if you decide to undergo the sleep apnea testing.   I will see you back in 1 year or sooner.  TESTS ORDERED: 1. Spirometry with bronchodilator challenge at next appointment in 1 year

## 2016-07-03 NOTE — Addendum Note (Signed)
Addended by: Parke Poisson E on: 07/03/2016 05:22 PM   Modules accepted: Orders

## 2016-07-07 ENCOUNTER — Ambulatory Visit (INDEPENDENT_AMBULATORY_CARE_PROVIDER_SITE_OTHER): Payer: Commercial Managed Care - PPO | Admitting: Family Medicine

## 2016-07-07 ENCOUNTER — Encounter: Payer: Self-pay | Admitting: Family Medicine

## 2016-07-07 ENCOUNTER — Telehealth: Payer: Self-pay | Admitting: Family Medicine

## 2016-07-07 VITALS — BP 127/89 | HR 109 | Temp 99.5°F | Ht 65.0 in | Wt 244.0 lb

## 2016-07-07 DIAGNOSIS — J019 Acute sinusitis, unspecified: Secondary | ICD-10-CM | POA: Diagnosis not present

## 2016-07-07 MED ORDER — METHYLPREDNISOLONE ACETATE 80 MG/ML IJ SUSP
120.0000 mg | Freq: Once | INTRAMUSCULAR | Status: AC
  Administered 2016-07-07: 120 mg via INTRAMUSCULAR

## 2016-07-07 MED ORDER — AMOXICILLIN-POT CLAVULANATE 875-125 MG PO TABS: 1.0000 | ORAL_TABLET | Freq: Two times a day (BID) | ORAL | 0 refills | Status: DC

## 2016-07-07 NOTE — Telephone Encounter (Signed)
Pt states she may have been confused about the amoxicillin. Does he want her to take?

## 2016-07-07 NOTE — Telephone Encounter (Signed)
Pt is at the pharmacy waiting for the amoxicillin   Pharm:  [facility]

## 2016-07-07 NOTE — Addendum Note (Signed)
Addended by: [name] A on: 07/07/2016 03:19 PM   Modules accepted: Orders

## 2016-07-07 NOTE — Progress Notes (Signed)
   Subjective:    Patient ID: Morgan [name], female    DOB: [birth year], 51 y.o.   MRN: [id number]  HPI Here for 4 days of sinus pressure, PND, and coughing up yellow sputum. Low grade fevers. Using Mucinex.    Review of Systems  Constitutional: Positive for fever.  HENT: Positive for congestion, sinus pain and sinus pressure. Negative for sore throat.   Eyes: Negative.   Respiratory: Positive for cough.        Objective:   Physical Exam  Constitutional: She appears well-developed and well-nourished.  HENT:  Right Ear: External ear normal.  Left Ear: External ear normal.  Nose: Nose normal.  Mouth/Throat: Oropharynx is clear and moist.  Eyes: Conjunctivae are normal.  Neck: No thyromegaly present.  Pulmonary/Chest: Effort normal and breath sounds normal.  Lymphadenopathy:    She has no cervical adenopathy.          Assessment & Plan:  Sinusitis, treat with Augmentin. Given a steroid shot. Written out of work today and tomorrow.  [name], MD

## 2016-07-07 NOTE — Addendum Note (Signed)
Addended by: Aggie Hacker A on: 07/07/2016 02:24 PM   Modules accepted: Orders

## 2016-07-07 NOTE — Telephone Encounter (Signed)
Dr. Sarajane Jews did send script e-scribe and I spoke with pt.

## 2016-07-07 NOTE — Progress Notes (Signed)
Pre visit review using our clinic review tool, if applicable. No additional management support is needed unless otherwise documented below in the visit note. 

## 2016-07-14 ENCOUNTER — Other Ambulatory Visit: Payer: Self-pay | Admitting: Family Medicine

## 2016-07-14 ENCOUNTER — Encounter: Payer: Self-pay | Admitting: Family Medicine

## 2016-07-14 MED ORDER — LEVOFLOXACIN 500 MG PO TABS: 500.0000 mg | ORAL_TABLET | Freq: Every day | ORAL | 0 refills | Status: DC

## 2016-07-14 NOTE — Telephone Encounter (Signed)
Have her stop the Augmentin. Call in Levaquin 500 mg daily for 10 days

## 2016-08-16 ENCOUNTER — Other Ambulatory Visit: Payer: Self-pay | Admitting: Family Medicine

## 2016-08-26 ENCOUNTER — Ambulatory Visit: Payer: Self-pay | Admitting: Family Medicine

## 2016-09-07 ENCOUNTER — Other Ambulatory Visit: Payer: Self-pay | Admitting: Gastroenterology

## 2016-09-08 ENCOUNTER — Encounter: Payer: Self-pay | Admitting: Family Medicine

## 2016-09-08 NOTE — Telephone Encounter (Signed)
I will need to see her first to evaluate this

## 2016-09-21 ENCOUNTER — Other Ambulatory Visit: Payer: Self-pay | Admitting: Family Medicine

## 2016-09-24 ENCOUNTER — Encounter: Payer: Self-pay | Admitting: Family Medicine

## 2016-09-24 NOTE — Telephone Encounter (Signed)
This is not something we can handle over the phone, pt needs OV to discuss. LMTCB

## 2016-10-15 ENCOUNTER — Other Ambulatory Visit: Payer: Self-pay | Admitting: Family Medicine

## 2016-10-22 ENCOUNTER — Encounter: Payer: Self-pay | Admitting: Family Medicine

## 2016-10-22 ENCOUNTER — Ambulatory Visit (INDEPENDENT_AMBULATORY_CARE_PROVIDER_SITE_OTHER): Payer: Commercial Managed Care - PPO | Admitting: Family Medicine

## 2016-10-22 VITALS — BP 110/86 | Temp 98.7°F | Ht 65.0 in | Wt 247.0 lb

## 2016-10-22 DIAGNOSIS — I10 Essential (primary) hypertension: Secondary | ICD-10-CM | POA: Diagnosis not present

## 2016-10-22 DIAGNOSIS — R5382 Chronic fatigue, unspecified: Secondary | ICD-10-CM

## 2016-10-22 DIAGNOSIS — F5089 Other specified eating disorder: Secondary | ICD-10-CM

## 2016-10-22 DIAGNOSIS — F502 Bulimia nervosa: Secondary | ICD-10-CM

## 2016-10-22 DIAGNOSIS — E119 Type 2 diabetes mellitus without complications: Secondary | ICD-10-CM | POA: Diagnosis not present

## 2016-10-22 NOTE — Patient Instructions (Signed)
WE NOW OFFER   [name] FAST TRACK!!!  SAME DAY Appointments for ACUTE CARE  Such as: Sprains, Injuries, cuts, abrasions, rashes, muscle pain, joint pain, back pain Colds, flu, sore throats, headache, allergies, cough, fever  Ear pain, sinus and eye infections Abdominal pain, nausea, vomiting, diarrhea, upset stomach Animal/insect bites  3 Easy Ways to Schedule: Walk-In Scheduling Call in scheduling Mychart Sign-up: [email or web address]         

## 2016-10-22 NOTE — Progress Notes (Signed)
   Subjective:    Patient ID: Morgan [name], female    DOB: 1966-04-18, 51 y.o.   MRN: [id number]  HPI Here to follow up on diabetes but also to discuss her chronic fatigue. She says she feels tired all the time every day. She sleeps fairly well although she gets up to urinate several times a night. She has gained some weight this past year. She does not exercise. She snores when she sleeps. She notes her brother has sleep apnea. She has not checked her glucoses for some time, and her last A1c was in December. She has seen Dr. Ashok Cordia and he has recommended a sleep study, but she has been reluctant to do this. She asks my opinion.    Review of Systems  Constitutional: Positive for fatigue.  Respiratory: Negative.   Cardiovascular: Negative.   Endocrine: Negative.   Neurological: Negative.        Objective:   Physical Exam  Constitutional: She is oriented to person, place, and time. She appears well-developed and well-nourished. No distress.  Neck: No thyromegaly present.  Cardiovascular: Normal rate, regular rhythm, normal heart sounds and intact distal pulses.   Pulmonary/Chest: Effort normal and breath sounds normal.  Lymphadenopathy:    She has no cervical adenopathy.  Neurological: She is alert and oriented to person, place, and time.          Assessment & Plan:  She has chronic fatigue, and I am fairly certain she has obstructive sleep apnea. I urged her to contact Dr. Ashok Cordia to set up a sleep study and she assured me she will. In the meantime we wil get labs today to evaluate for other issues and will get an A1c for the diabetes. I will also refer her to Nutrition to learn more about this condition. [name], MD

## 2016-10-23 ENCOUNTER — Encounter: Payer: Self-pay | Admitting: Pulmonary Disease

## 2016-10-23 ENCOUNTER — Encounter: Payer: Self-pay | Admitting: Family Medicine

## 2016-10-23 LAB — HEPATIC FUNCTION PANEL
ALT: 23 U/L (ref 0–35)
AST: 14 U/L (ref 0–37)
Albumin: 4.1 g/dL (ref 3.5–5.2)
Alkaline Phosphatase: 72 U/L (ref 39–117)
Bilirubin, Direct: 0.1 mg/dL (ref 0.0–0.3)
Total Bilirubin: 0.5 mg/dL (ref 0.2–1.2)
Total Protein: 7 g/dL (ref 6.0–8.3)

## 2016-10-23 LAB — CBC WITH DIFFERENTIAL/PLATELET
Basophils Absolute: 0.3 10*3/uL — ABNORMAL HIGH (ref 0.0–0.1)
Basophils Relative: 2.9 % (ref 0.0–3.0)
Eosinophils Absolute: 0 10*3/uL (ref 0.0–0.7)
Eosinophils Relative: 0.4 % (ref 0.0–5.0)
HCT: 39.7 % (ref 36.0–46.0)
Hemoglobin: 13 g/dL (ref 12.0–15.0)
Lymphocytes Relative: 28.8 % (ref 12.0–46.0)
Lymphs Abs: 3.2 10*3/uL (ref 0.7–4.0)
MCHC: 32.8 g/dL (ref 30.0–36.0)
MCV: 88.9 fl (ref 78.0–100.0)
Monocytes Absolute: 0.6 10*3/uL (ref 0.1–1.0)
Monocytes Relative: 5.2 % (ref 3.0–12.0)
Neutro Abs: 7 10*3/uL (ref 1.4–7.7)
Neutrophils Relative %: 62.7 % (ref 43.0–77.0)
Platelets: 356 10*3/uL (ref 150.0–400.0)
RBC: 4.46 Mil/uL (ref 3.87–5.11)
RDW: 15.1 % (ref 11.5–15.5)
WBC: 11.2 10*3/uL — ABNORMAL HIGH (ref 4.0–10.5)

## 2016-10-23 LAB — BASIC METABOLIC PANEL
BUN: 19 mg/dL (ref 6–23)
CO2: 30 mEq/L (ref 19–32)
Calcium: 9.9 mg/dL (ref 8.4–10.5)
Chloride: 104 mEq/L (ref 96–112)
Creatinine, Ser: 0.77 mg/dL (ref 0.40–1.20)
GFR: 83.33 mL/min (ref >60.00)
Glucose, Bld: 123 mg/dL — ABNORMAL HIGH (ref 70–99)
Potassium: 3.7 mEq/L (ref 3.5–5.1)
Sodium: 142 mEq/L (ref 135–145)

## 2016-10-23 LAB — TSH: TSH: 1.08 u[IU]/mL (ref 0.35–4.50)

## 2016-10-23 LAB — HEMOGLOBIN A1C: Hgb A1c MFr Bld: 6.8 % — ABNORMAL HIGH (ref 4.6–6.5)

## 2016-10-23 NOTE — Telephone Encounter (Signed)
Dr Ashok Cordia,  Pt decided to move forward with sleep study and would like this ordered.  Please advise which sleep test you would like and we can order it.   Thanks.

## 2016-10-23 NOTE — Telephone Encounter (Signed)
Can you please go ahead and order the polysomnogram. Thanks.

## 2016-10-24 NOTE — Telephone Encounter (Signed)
Tell her that the entire CBC is fine, basophils, etc. We all have minor shifts up or down, but these are within normal limits. Having a GFR slightly high is a good thing, this means her kidneys are working very well

## 2016-10-28 DIAGNOSIS — E119 Type 2 diabetes mellitus without complications: Secondary | ICD-10-CM | POA: Diagnosis not present

## 2016-10-28 LAB — HM DIABETES EYE EXAM

## 2016-10-30 ENCOUNTER — Encounter: Payer: Self-pay | Admitting: Family Medicine

## 2016-10-30 ENCOUNTER — Other Ambulatory Visit: Payer: Self-pay | Admitting: Pulmonary Disease

## 2016-10-30 DIAGNOSIS — R0681 Apnea, not elsewhere classified: Secondary | ICD-10-CM

## 2016-10-30 NOTE — Telephone Encounter (Signed)
I'm sorry to hear that but I understand

## 2016-10-31 ENCOUNTER — Telehealth: Payer: Self-pay | Admitting: Pulmonary Disease

## 2016-10-31 NOTE — Telephone Encounter (Signed)
Left message informing patient she will receive a call to have split night study scheduled. Nothing further is needed.

## 2016-11-01 ENCOUNTER — Other Ambulatory Visit: Payer: Self-pay | Admitting: Family Medicine

## 2016-11-03 NOTE — Telephone Encounter (Signed)
Looks like old script, can we refill this?

## 2016-11-18 ENCOUNTER — Encounter: Payer: Self-pay | Admitting: Family Medicine

## 2016-11-28 ENCOUNTER — Other Ambulatory Visit: Payer: Self-pay | Admitting: Family Medicine

## 2016-12-04 ENCOUNTER — Encounter: Payer: Self-pay | Admitting: Dietician

## 2016-12-04 ENCOUNTER — Encounter: Payer: Commercial Managed Care - PPO | Attending: Family Medicine | Admitting: Dietician

## 2016-12-04 DIAGNOSIS — F502 Bulimia nervosa: Secondary | ICD-10-CM | POA: Diagnosis present

## 2016-12-04 DIAGNOSIS — E119 Type 2 diabetes mellitus without complications: Secondary | ICD-10-CM | POA: Insufficient documentation

## 2016-12-04 DIAGNOSIS — Z713 Dietary counseling and surveillance: Secondary | ICD-10-CM | POA: Insufficient documentation

## 2016-12-04 NOTE — Progress Notes (Signed)
Medical Nutrition Therapy:  Appt start time: 0254 end time:  1700.   Assessment:  Primary concerns today: Patient is here alone.  She was referred for type 2 diabetes.  Her last A1C was 6.8% 10/22/16.  She states that she is very hungry all the time.  She will eat a large pizza and then eat an hour later.  She eats when stressed, sad, bored and just wanting a hand to mouth movement, and due to anxiety and for comfort.  Patient is being evaluated for sleep apnea in 3 weeks.  She has a hx of GERD, hyperlipidemia, HTN and had a history of ovarian cancer and other tumors.  Weight hx: 251 lbs today.  This is her highest weight. 125 lbs at 51 yo after ovarian cancer surgery She was on Weight Watcher's in the past and lost 40 lbs and regained the weight when she going to the meetings.  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  She enjoys talking friends on the phone.  Did a water aerobics class for 2 months and really enjoyed it.  She ate less then.  She never learned how to cook.  She was eating a lot of TV dinners but quit because they are high in sodium.  She has strong faith and gets much support from her church and pastors.  Preferred Learning Style:   No preference indicated   Learning Readiness:   Contemplating  MEDICATIONS: see list   DIETARY INTAKE:  Usual eating pattern includes 3-4 meals and 3-5 snacks per day. She is lactose intolerant. Everyday foods include fast food.  Avoided foods include bananas, strawberries, grapefruit, oranges.    24-hr recall:  B ( AM): instant oatmeal at home OR sausage wrap out to eat Snk ( AM): NABS, applesauce L ( PM): bologna or [place] or PB&Jelly sandwich, green apple, chips Snk ( PM): NABS and apple D ( PM): 2 quarter pounder with cheese, large fries. Snk ( PM): large bowl of cereal with lactaid milk Beverages: 1/2 liter Mt. Dew daily, lactaid milk, water, sweet tea once per week.  Usual physical activity: none  Progress Towards  Goal(s):  In progress.   Nutritional Diagnosis:  NB-1.1 Food and nutrition-related knowledge deficit NB-1.5 Disordered eating pattern As related to unknown cause.  As evidenced by diet hx and patient report.    Intervention:  Nutrition counseling/education related to nutrition and mindful eating.  Discussed nutrition as a means to normalize her intake.  Discussed balanced meal ideas, the importance of a regular meal schedule.  Discussed options for counselors and RD options for follow-up and support.  Discussed impact of Sleep Apnea on weight and diabetes control and overall feelings of well being.  PLAN: Complete the sleep study for sleep apnea.  If you have this, follow the recommendations.  Find something that brings you joy. (Pool exercises, etc.) Aim to be more active most days.  Start slow and increase as tolerated.    Be sure to eat Breakfast, Lunch and Dinner daily.  Eat a snack if you are hungry or to avoid being ravenous later.  Increase your intake of non-starchy vegetables, sweet potatoes, and beans.   (This will increase your fiber intake.)  Be mindful when you eat.  Eat slowly.  Eat at the table without distractions  (away from TV)  Ask, Am I really hungry or eating for another reason.  Teaching Method Utilized:  Visual Auditory Hands on  Handouts given during visit include:  My plate  Snack list  Breakfast ideas  Therapy list with those highlighted who accept her insurance  Barriers to learning/adherence to lifestyle change: binge eating, insatiable appetite.  Demonstrated degree of understanding via:  Teach Back   Monitoring/Evaluation:  Dietary intake, exercise, and body weight in 3 week(s).

## 2016-12-04 NOTE — Patient Instructions (Addendum)
Complete the sleep study for sleep apnea.  If you have this, follow the recommendations.  Find something that brings you joy. (Pool exercises, etc.) Aim to be more active most days.  Start slow and increase as tolerated.    Be sure to eat Breakfast, Lunch and Dinner daily.  Eat a snack if you are hungry or to avoid being ravenous later.  Increase your intake of non-starchy vegetables, sweet potatoes, and beans.   (This will increase your fiber intake.)  Be mindful when you eat.  Eat slowly.  Eat at the table without distractions  (away from TV)  Ask, Am I really hungry or eating for another reason.

## 2016-12-05 ENCOUNTER — Encounter: Payer: Self-pay | Admitting: Family Medicine

## 2016-12-05 NOTE — Telephone Encounter (Signed)
I put in a referral for her to see someone for compulsive overeating

## 2016-12-05 NOTE — Addendum Note (Signed)
Addended by: [name] on: 12/05/2016 04:51 PM   Modules accepted: Orders

## 2016-12-16 ENCOUNTER — Other Ambulatory Visit: Payer: Self-pay | Admitting: Pulmonary Disease

## 2016-12-16 MED ORDER — ALBUTEROL SULFATE HFA 108 (90 BASE) MCG/ACT IN AERS: 2.0000 | INHALATION_SPRAY | Freq: Four times a day (QID) | RESPIRATORY_TRACT | 6 refills | Status: DC | PRN

## 2016-12-17 ENCOUNTER — Encounter: Payer: Self-pay | Admitting: Pulmonary Disease

## 2016-12-17 ENCOUNTER — Encounter: Payer: Self-pay | Admitting: *Deleted

## 2016-12-18 ENCOUNTER — Telehealth: Payer: Self-pay | Admitting: Pulmonary Disease

## 2016-12-19 ENCOUNTER — Ambulatory Visit (INDEPENDENT_AMBULATORY_CARE_PROVIDER_SITE_OTHER): Payer: Commercial Managed Care - PPO | Admitting: Family Medicine

## 2016-12-19 ENCOUNTER — Encounter: Payer: Self-pay | Admitting: Family Medicine

## 2016-12-19 VITALS — BP 114/88 | HR 105 | Temp 98.3°F | Ht 65.0 in | Wt 251.0 lb

## 2016-12-19 DIAGNOSIS — J018 Other acute sinusitis: Secondary | ICD-10-CM

## 2016-12-19 MED ORDER — AMOXICILLIN-POT CLAVULANATE 875-125 MG PO TABS: 1.0000 | ORAL_TABLET | Freq: Two times a day (BID) | ORAL | 0 refills | Status: DC

## 2016-12-19 MED ORDER — METHYLPREDNISOLONE ACETATE 80 MG/ML IJ SUSP
120.0000 mg | Freq: Once | INTRAMUSCULAR | Status: AC
  Administered 2016-12-19: 120 mg via INTRAMUSCULAR

## 2016-12-19 NOTE — Addendum Note (Signed)
Addended by: Aggie Hacker A on: 12/19/2016 03:57 PM   Modules accepted: Orders

## 2016-12-19 NOTE — Addendum Note (Signed)
Addended by: [name] A on: 12/19/2016 03:46 PM   Modules accepted: Orders

## 2016-12-19 NOTE — Patient Instructions (Signed)
WE NOW OFFER   [facility] FAST TRACK!!!  SAME DAY Appointments for ACUTE CARE  Such as: Sprains, Injuries, cuts, abrasions, rashes, muscle pain, joint pain, back pain Colds, flu, sore throats, headache, allergies, cough, fever  Ear pain, sinus and eye infections Abdominal pain, nausea, vomiting, diarrhea, upset stomach Animal/insect bites  3 Easy Ways to Schedule: Walk-In Scheduling Call in scheduling Mychart Sign-up: [email or web address]         

## 2016-12-19 NOTE — Progress Notes (Signed)
   Subjective:    Patient ID: Morgan [name], female    DOB: [birth year], 51 y.o.   MRN: [id number]  HPI Here for 4 days of stuffy head, PND, and coughing up yellow sputum. No fever.    Review of Systems  Constitutional: Negative.   HENT: Positive for congestion, postnasal drip, sinus pain and sinus pressure. Negative for sore throat.   Eyes: Negative.   Respiratory: Positive for cough.        Objective:   Physical Exam  Constitutional: She appears well-developed and well-nourished.  HENT:  Right Ear: External ear normal.  Left Ear: External ear normal.  Nose: Nose normal.  Mouth/Throat: Oropharynx is clear and moist.  Eyes: Conjunctivae are normal.  Neck: No thyromegaly present.  Pulmonary/Chest: Effort normal and breath sounds normal. No respiratory distress. She has no wheezes. She has no rales.  Lymphadenopathy:    She has no cervical adenopathy.          Assessment & Plan:  Sinusitis, treat with Augmentin.  [name], MD

## 2016-12-19 NOTE — Telephone Encounter (Signed)
A refill authorization request was faxed to [facility] at [phone] for ventolin hfa 90 mcg inhaler. The directions states to inhale 2 puffs into the lungs every 6 hours as needed for wheezing or shortness of breath with qty 18 and 3 refills. A confirmation fax was received. Nothing further is needed.

## 2016-12-23 ENCOUNTER — Ambulatory Visit (HOSPITAL_BASED_OUTPATIENT_CLINIC_OR_DEPARTMENT_OTHER): Payer: Commercial Managed Care - PPO | Attending: Pulmonary Disease | Admitting: Pulmonary Disease

## 2016-12-23 DIAGNOSIS — G4733 Obstructive sleep apnea (adult) (pediatric): Secondary | ICD-10-CM | POA: Insufficient documentation

## 2016-12-23 DIAGNOSIS — I10 Essential (primary) hypertension: Secondary | ICD-10-CM | POA: Diagnosis not present

## 2016-12-23 DIAGNOSIS — R0683 Snoring: Secondary | ICD-10-CM | POA: Insufficient documentation

## 2016-12-23 DIAGNOSIS — Z6841 Body Mass Index (BMI) 40.0 and over, adult: Secondary | ICD-10-CM | POA: Insufficient documentation

## 2016-12-23 DIAGNOSIS — E669 Obesity, unspecified: Secondary | ICD-10-CM | POA: Diagnosis not present

## 2016-12-23 DIAGNOSIS — E119 Type 2 diabetes mellitus without complications: Secondary | ICD-10-CM | POA: Diagnosis not present

## 2016-12-23 DIAGNOSIS — G4719 Other hypersomnia: Secondary | ICD-10-CM | POA: Insufficient documentation

## 2016-12-23 DIAGNOSIS — R0681 Apnea, not elsewhere classified: Secondary | ICD-10-CM

## 2016-12-25 ENCOUNTER — Encounter: Payer: Commercial Managed Care - PPO | Admitting: *Deleted

## 2016-12-25 ENCOUNTER — Encounter: Payer: Self-pay | Admitting: Pulmonary Disease

## 2016-12-25 ENCOUNTER — Encounter: Payer: Self-pay | Admitting: *Deleted

## 2016-12-25 DIAGNOSIS — E119 Type 2 diabetes mellitus without complications: Secondary | ICD-10-CM | POA: Diagnosis not present

## 2016-12-25 DIAGNOSIS — F502 Bulimia nervosa: Secondary | ICD-10-CM | POA: Diagnosis not present

## 2016-12-25 DIAGNOSIS — F509 Eating disorder, unspecified: Secondary | ICD-10-CM

## 2016-12-25 DIAGNOSIS — Z713 Dietary counseling and surveillance: Secondary | ICD-10-CM | POA: Diagnosis not present

## 2016-12-25 NOTE — Progress Notes (Signed)
Appointment start time: 1630  Appointment end time: 1730  Patient was seen on 12/25/16 for nutrition counseling pertaining to disordered eating   Assessment More than likely has sleep apnea States she can't stop eating.  States she will eat a large pizza and then go out to eat right after Also eating for emotional reasons States she eats while watching tv.   Things have gotten worse in the past 6 months Seems to meet criteria for BED.  Binges a couple times a week.  Is a fast eater Knows she has anxiety Spends a good deal of her money on food  Did weight watchers in the 1990s.  Lost 40 pounds but stopped doing it Is inactive States she is lazy and weak   Medical Information:  Changes in hair, skin, nails since ED started: none Chewing/swallowing difficulties : none Relux or heartburn: on prilosec Trouble with teeth: none Constipation, diarrhea: both. Hemorrhoids.  BM daily.  Sometimes a strain Difficulty focusing  Sometimes chest pain No dizziness Poor energy Sleep apnea Average mood   Dietary assessment: A typical day consists of 2 meals and multiple snacks  Safe foods include: donuts, pizza, mt dew, chocolate candy bars, pasta Fear foods: none Avoided foods include:fruits, spicy foods, rice  24 hour recall:  B: 3 scrambled eggs with cottage cheese L: [place] fajitas with guacamole and 5-6 tortilla, chips D: maria calendars frozen enchiladas S: PB and J (feels like she can't sleep without eating) Beverages: mt dew, water, sometimes lactaid     B: 2 oatmeal packets L: frozen meal S: double quarter pounds with fries and coke   Nutrition Diagnosis: NB-1.5 Disordered eating pattern As related to binge eating.  As evidenced by dietary recall.  Intervention/Goals: nutrition counseling provided.  Meets criteria for BED most likely. Referred to [facility] for therapy    Monitoring and Evaluation: Patient will follow up in 2 weeks.

## 2016-12-29 ENCOUNTER — Encounter: Payer: Self-pay | Admitting: Family Medicine

## 2016-12-29 DIAGNOSIS — G4730 Sleep apnea, unspecified: Secondary | ICD-10-CM | POA: Diagnosis not present

## 2016-12-29 NOTE — Procedures (Signed)
Patient Name: Morgan [name], Morgan [name] Date: 12/23/2016 Gender: Female D.O.B: August 15, 1965 Age (years): 51 Referring Provider: Javier Glazier Height (inches): 44 Interpreting Physician: Kara Mead MD, ABSM Weight (lbs): 251 RPSGT: Carolin Coy BMI: 44 MRN: [id number] Neck Size: 18.50   CLINICAL INFORMATION Sleep Study Type: Split Night CPAP  Indication for sleep study: Diabetes, Excessive Daytime Sleepiness, Hypertension, Obesity, OSA, Snoring, Witnessed Apneas  Epworth Sleepiness Score:13  SLEEP STUDY TECHNIQUE As per the AASM Manual for the Scoring of Sleep and Associated Events v2.3 (April 2016) with a hypopnea requiring 4% desaturations.  The channels recorded and monitored were frontal, central and occipital EEG, electrooculogram (EOG), submentalis EMG (chin), nasal and oral airflow, thoracic and abdominal wall motion, anterior tibialis EMG, snore microphone, electrocardiogram, and pulse oximetry. Continuous positive airway pressure (CPAP) was initiated when the patient met split night criteria and was titrated according to treat sleep-disordered breathing.  RESPIRATORY PARAMETERS Diagnostic  Total AHI (/hr): 112.1 RDI (/hr): 116.3 OA Index (/hr): 12.9 CA Index (/hr): 1.7 REM AHI (/hr): 96.5 NREM AHI (/hr): 115.0 Supine AHI (/hr): N/A Non-supine AHI (/hr): 112.08 Min O2 Sat (%): 55.00 Mean O2 (%): 86.82 Time below 88% (min): 72.2   Titration  Optimal Pressure (cm): 15 AHI at Optimal Pressure (/hr): 0.0 Min O2 at Optimal Pressure (%): 89.0 Supine % at Optimal (%): 59 Sleep % at Optimal (%): 36   SLEEP ARCHITECTURE The recording time for the entire night was 425.1 minutes.  During a baseline period of 228.5 minutes, the patient slept for 144.0 minutes in REM and nonREM, yielding a sleep efficiency of 63.0%. Sleep onset after lights out was 24.7 minutes with a REM latency of 180.0 minutes. The patient spent 17.01% of the night in stage N1 sleep, 67.01% in stage N2 sleep,  0.00% in stage N3 and 15.97% in REM.  During the titration period of 195.0 minutes, the patient slept for 165.5 minutes in REM and nonREM, yielding a sleep efficiency of 84.9%. Sleep onset after CPAP initiation was 10.7 minutes with a REM latency of 47.5 minutes. The patient spent 12.39% of the night in stage N1 sleep, 45.92% in stage N2 sleep, 0.00% in stage N3 and 41.69% in REM.  CARDIAC DATA The 2 lead EKG demonstrated sinus rhythm. The mean heart rate was 83.47 beats per minute. Other EKG findings include: None.   LEG MOVEMENT DATA The total Periodic Limb Movements of Sleep (PLMS) were 0. The PLMS index was 0.00 .  IMPRESSIONS - Severe obstructive sleep apnea occurred during the diagnostic portion of the study (AHI = 112.1/hour). An optimal PAP pressure was selected for this patient ( 15 cm of water) - No significant central sleep apnea occurred during the diagnostic portion of the study (CAI = 1.7/hour). - Moderate oxygen desaturation was noted during the diagnostic portion of the study (Min O2 =55.00%). - The patient snored with Moderate snoring volume during the diagnostic portion of the study. - No cardiac abnormalities were noted during this study. - Clinically significant periodic limb movements did not occur during sleep.   DIAGNOSIS - Obstructive Sleep Apnea (327.23 [G47.33 ICD-10])   RECOMMENDATIONS - Trial of CPAP therapy on 15 cm H2O with a Small size Resmed Full Face Mask AirFit F10 mask and heated humidification. - Avoid alcohol, sedatives and other CNS depressants that may worsen sleep apnea and disrupt normal sleep architecture. - Sleep hygiene should be reviewed to assess factors that may improve sleep quality. - Weight management and regular exercise should be initiated or  continued. - Return to [facility] Center for re-evaluation after 4 weeks of therapy    Kara Mead MD Board Certified in [place]

## 2017-01-05 ENCOUNTER — Telehealth: Payer: Self-pay | Admitting: Pulmonary Disease

## 2017-01-05 NOTE — Telephone Encounter (Signed)
Patient called - pt has appt tomorrow w/ Durene Cal - she will wait until tomorrow to get results - pr

## 2017-01-05 NOTE — Telephone Encounter (Signed)
Per staff message received from Media "Please let the patient know that her sleep study did show significant sleep apnea. I would recommend trying CPAP therapy and there are different masks that may be more comfortable for her. We can discuss this further at her appointment on 7/10 if she wishes. Otherwise, if she is agreeable, place an order for CPAP therapy on 15 cm H2O with a Small size Resmed Full Face Mask AirFit F10 mask and heated humidification.  Thank you."  Patient did not answer and voicemail was left for patient to contact office for results. Will await patient's call.

## 2017-01-06 ENCOUNTER — Ambulatory Visit (INDEPENDENT_AMBULATORY_CARE_PROVIDER_SITE_OTHER): Payer: Commercial Managed Care - PPO | Admitting: Pulmonary Disease

## 2017-01-06 ENCOUNTER — Encounter: Payer: Self-pay | Admitting: Pulmonary Disease

## 2017-01-06 VITALS — BP 120/80 | HR 95 | Ht 63.5 in | Wt 247.0 lb

## 2017-01-06 DIAGNOSIS — Z9989 Dependence on other enabling machines and devices: Secondary | ICD-10-CM

## 2017-01-06 DIAGNOSIS — Z579 Occupational exposure to unspecified risk factor: Secondary | ICD-10-CM | POA: Diagnosis not present

## 2017-01-06 DIAGNOSIS — R1311 Dysphagia, oral phase: Secondary | ICD-10-CM | POA: Diagnosis not present

## 2017-01-06 DIAGNOSIS — G4733 Obstructive sleep apnea (adult) (pediatric): Secondary | ICD-10-CM

## 2017-01-06 DIAGNOSIS — J45909 Unspecified asthma, uncomplicated: Secondary | ICD-10-CM | POA: Diagnosis not present

## 2017-01-06 MED ORDER — BUDESONIDE-FORMOTEROL FUMARATE 160-4.5 MCG/ACT IN AERO: 2.0000 | INHALATION_SPRAY | Freq: Two times a day (BID) | RESPIRATORY_TRACT | 0 refills | Status: DC

## 2017-01-06 NOTE — Progress Notes (Signed)
Patient seen in the office today and instructed on use of Symbicort 160.  Patient expressed understanding and demonstrated technique. 

## 2017-01-06 NOTE — Patient Instructions (Addendum)
   Use the Symbicort inhaler we are giving you today by doing 2 puffs twice daily.   If the Symbicort seems to help your cough let me know and we will send in a prescription.  Remember to remove any dentures or partials you have before you use your inhaler. Remember to brush your teeth & tongue after you use your inhaler as well as rinse, gargle & spit to keep from getting thrush in your mouth or on your tongue (a white film).   If your cough doesn't get better let me know too so we can have you seen an ENT specialist about your swallowing.   We are going to set you up with a home CPAP.   Remember to try wearing your mask & use your machine while you are watching TV to help desensitize you to it.

## 2017-01-06 NOTE — Addendum Note (Signed)
Addended by: Tyson Dense on: 01/06/2017 04:15 PM   Modules accepted: Orders

## 2017-01-06 NOTE — Progress Notes (Signed)
Subjective:    Patient ID: Morgan [name], female    DOB: 1966-05-30, 51 y.o.   MRN: [id number]  C.C.:  Follow-up for Severe OSA, Chronic Cough/Occupational Asthma, & GERD.  HPI Severe OSA: Diagnosed since previous testing. Started on CPAP at 15 cm H2O given the severity of her sleep apnea. She has not yet been started on CPAP therapy.   Chronic cough/occupational asthma: Patient had negative methacholine challenge test in 2013. Prescribed albuterol inhaler to use as needed. Does have work exposure. She reports she is coughing more lately. She was given an IM injection of steroids. Previously was also prescribed Augmentin because she felt it was making her cough worse. She denies any wheezing.   GERD: Previously prescribed Prilosec twice a day. She reports intermittent dysphagia & gagging. She denies any reflux or morning brash water taste.   Review of Systems No chest pain or pressure. No fever or chills. No headaches.   Allergies  Allergen Reactions  . Lisinopril Cough    Current Outpatient Prescriptions on File Prior to Visit  Medication Sig Dispense Refill  . albuterol (PROVENTIL HFA;VENTOLIN HFA) 108 (90 Base) MCG/ACT inhaler Inhale 2 puffs into the lungs every 6 (six) hours as needed for wheezing or shortness of breath. 1 Inhaler 6  . atorvastatin (LIPITOR) 40 MG tablet TAKE ONE TABLET BY MOUTH DAILY 30 tablet 2  . dicyclomine (BENTYL) 20 MG tablet Take 1 tablet (20 mg total) by mouth every 8 (eight) hours as needed (bowel cramps). 90 tablet 5  . glipiZIDE (GLUCOTROL) 10 MG tablet TAKE 1 TABLET (10 MG TOTAL) BY MOUTH 2 (TWO) TIMES DAILY BEFORE A MEAL. 60 tablet 11  . glucose blood (PRODIGY AUTOCODE TEST) test strip Test once per day and diagnosis code is E 11.9 100 each 1  . losartan-hydrochlorothiazide (HYZAAR) 50-12.5 MG tablet TAKE ONE TABLET BY MOUTH DAILY 30 tablet 2  . meloxicam (MOBIC) 15 MG tablet Take 1 tablet (15 mg total) by mouth daily. 30 tablet 11  . metFORMIN  (GLUCOPHAGE) 1000 MG tablet TAKE 1 TABLET (1,000 MG TOTAL) BY MOUTH 2 (TWO) TIMES DAILY WITH A MEAL. 60 tablet 2  . metoprolol tartrate (LOPRESSOR) 25 MG tablet TAKE 1 TABLET BY MOUTH TWICE DAILY 60 tablet 3  . omeprazole (PRILOSEC) 40 MG capsule TAKE ONE CAPSULE BY MOUTH TWO TIMES A DAY 60 capsule 3  . amoxicillin-clavulanate (AUGMENTIN) 875-125 MG tablet Take 1 tablet by mouth 2 (two) times daily. (Patient not taking: Reported on 01/06/2017) 20 tablet 0   No current facility-administered medications on file prior to visit.     Past Medical History:  Diagnosis Date  . Anal fissure    saw Dr. Mercy Riding   . Arthritis   . Diabetes mellitus   . GERD (gastroesophageal reflux disease)   . History of ovarian cancer in adulthood   . Hyperlipidemia   . Hypertension   . Ovarian cancer ([place])   . Skin cancer (melanoma) ([place])   . Tubular adenoma of colon 01/2014    Past Surgical History:  Procedure Laterality Date  . ABDOMINAL HYSTERECTOMY  1986   BSO  . BASAL CELL CARCINOMA EXCISION  10-09-12   lower lip per Dr. Link Snuffer   . COLONOSCOPY  02-17-14   per Dr. Olevia Perches, adenomatous polyps, repeat in 5 yrs   . GLBS tumor ovary 1986    . HEMORRHOID SURGERY    . HIP FRACTURE SURGERY    . MELANOMA EXCISION  1997   upper  chest     Family History  Problem Relation Age of Onset  . Ovarian cancer Sister   . Asthma Mother   . Arthritis Unknown   . Diabetes Unknown   . Prostate cancer Unknown   . Breast cancer Unknown   . Coronary artery disease Unknown   . Colon cancer Paternal Grandfather   . Pancreatic cancer Neg Hx   . Stomach cancer Neg Hx     Social History   Social History  . Marital status: Single    Spouse name: N/A  . Number of children: N/A  . 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 Smoking status: Never Smoker  . Smokeless tobacco: Never Used  . Alcohol use No  . Drug use: No  . Sexual  activity: Not Asked   Other Topics Concern  . None   Social History Narrative   Originally from [place], [place], & moved to [place] at 51 y.o. She reports she started a new job in July 2016. 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 No pets currently. No bird, mold, or hot tub exposure.       Objective:   Physical Exam BP 120/80 (BP Location: Right Arm, Patient Position: Sitting, Cuff Size: Large)   Pulse 95   Ht 5' 3.5" (1.613 m)   Wt 247 lb (112 kg)   SpO2 97%   BMI 43.07 kg/m   General:  Awake. Alert. No distress. Obese. Integument:  Warm & dry. No rash on exposed skin. Extremities:  No cyanosis or clubbing.  HEENT:  Moist mucus membranes. Mild bilateral nasal turbinate swelling. No scleral icterus.  Cardiovascular:  Regular rate. No edema. Normal S1 & S2.  Pulmonary:  Good auscultation bilaterally. No accessory muscle use on room air. Abdomen: Soft. Normal bowel sounds. Protuberant. Musculoskeletal:  Normal bulk and tone. No joint deformity or effusion appreciated.  PFT 07/03/16: FVC 2.80 L (77%) FEV1 2.55 L (89%) FEV1/FVC 0.91 FEF 25-75 3.82 L (140%) negative bronchodilator response                                                                    DLCO corrected 87% (Hgb 15.0) 12/25/15: FVC 2.68 L (74%) FEV1 2.44 L (85%) FEV1/FVC 0.91 FEF 25-75 3.43 L (125%) negative bronchodilator response TLC 4.32 L (83%) RV 75% ERV 24% DLCO uncorrected 75% 12/04/11: FVC 2.87 L (77%) FEV1 2.59 L (88%) FEV1/FVC 0.90 FEF 25-75 3.32 L (114%) negative bronchodilator response  SPLIT NIGHT SLEEP STUDY (12/23/16):  Severe obstructive sleep apnea with AHI 112.1 events/hour with desaturation to 55% during the diagnostic portion of study on room air. Optimal CPAP pressure 15 cm H2O. No cardiac abnormalities or clinically significant periodic limb movements  occurred during sleep.  METHACHOLINE CHALLENGE (12/04/11): Maximum 7% reduction in FEV1 at level V dilution of methacholine. Normal bronchial hyperactivity.  IMAGING CXR PA/LAT 10/24/15 (previously reviewed by me):  No parenchymal nodule or opacity. No pleural effusion. Heart normal in size & mediastinum normal in contour.  BARIUM SWALLOW (09/21/15): Normal esophageal motility without aspiration. Small sliding type hiatal hernia and episodes of spontaneous gastroesophageal reflux noted. No esophageal mass or stricture.  CARDIAC TTE (08/17/08): LV normal in size with EF 60%. No regional wall motion abnormalities. LA & RA normal in size. RV normal in size and function. No aortic stenosis or regurgitation. No mitral stenosis or regurgitation. No tricuspid stenosis or regurgitation. No pericardial effusion.    Assessment & Plan:  51 y.o. female with newly diagnosed severe sleep apnea and occupational asthma. Patient also with GERD. Reflux seems to be well-controlled and I do not feel contributing to her cough. Given she has mostly cervical phase dysphagia she may benefit from direct laryngoscopy. However, as she does have some coughing today with deep inspiration a trial of inhaled corticosteroid is reasonable. We had a lengthy discussion regarding the severity of her sleep apnea and my strong recommendation to consider CPAP therapy. I instructed the patient contact me if she had any questions or concerns before her next appointment.  1. Severe OSA:  Patient amenable to starting CPAP therapy. Provided recommendations on desensitization through watching TV. When for download after 4 weeks of use. 2. Chronic cough/occupational asthma:  Trying patient on Symbicort 160/4.5. 3. Cervical dysphagia: If cough does not improve on Symbicort plan to consider ENT consultation for direct laryngoscopy. 4. GERD:  Asymptomatic on twice a day Protonix. No changes. 5. Health maintenance: Status post influenza vaccine  January 2018. 6. Follow-up: Return to clinic in 6-8 weeks or sooner if needed.  Sonia Baller Ashok Cordia, M.D. [facility] Pager:  [phone] After 3pm or if no response, call [phone] 3:13 PM 01/06/17

## 2017-01-07 ENCOUNTER — Encounter: Payer: Self-pay | Admitting: Family Medicine

## 2017-01-07 ENCOUNTER — Other Ambulatory Visit: Payer: Self-pay | Admitting: Gastroenterology

## 2017-01-07 NOTE — Telephone Encounter (Signed)
Noted  

## 2017-01-07 NOTE — Telephone Encounter (Signed)
Pt was last seen on 08/2015. Are you ok to refill Omeprazole or does she need a visit? Thank you.

## 2017-01-08 ENCOUNTER — Encounter: Payer: Self-pay | Admitting: Pulmonary Disease

## 2017-01-08 ENCOUNTER — Encounter: Payer: Commercial Managed Care - PPO | Attending: Family Medicine | Admitting: *Deleted

## 2017-01-08 DIAGNOSIS — Z713 Dietary counseling and surveillance: Secondary | ICD-10-CM | POA: Insufficient documentation

## 2017-01-08 DIAGNOSIS — E785 Hyperlipidemia, unspecified: Secondary | ICD-10-CM

## 2017-01-08 DIAGNOSIS — F502 Bulimia nervosa: Secondary | ICD-10-CM | POA: Diagnosis present

## 2017-01-08 DIAGNOSIS — F509 Eating disorder, unspecified: Secondary | ICD-10-CM

## 2017-01-08 DIAGNOSIS — E119 Type 2 diabetes mellitus without complications: Secondary | ICD-10-CM

## 2017-01-08 DIAGNOSIS — I10 Essential (primary) hypertension: Secondary | ICD-10-CM

## 2017-01-08 NOTE — Patient Instructions (Addendum)
Breakfasts: eggs   Tortilla, avocado  lactaid milk  oatmeal Lunches: PB and J sandwich (bread and PB and J)  Ham  Mustard  spaghettios or ravioli  Pasta  Sauce  Frozen meatballs Snacks : apples  Cinnamon bun  Candy bar  Mt Dew  Pudding  Cottage cheese  applesauce   Dinner: canned veggies  Potatoes  Beans  Pork chops   call [facility] [phone]

## 2017-01-08 NOTE — Telephone Encounter (Signed)
Yes we can refill it - 90 day supply with a refill. She can see me within the next 6 months for reassessment, she is on high dose PPI. Thanks

## 2017-01-08 NOTE — Progress Notes (Signed)
Appointment start time: 1630  Appointment end time: 1730  Patient was seen on 01/08/17 for nutrition counseling pertaining to disordered eating   Assessment Saw karla townsend for therapy.  Was $60/visit Was diagnosed with severe OSA and will start CPAP next week Is struggling with coughing/gagging.  Started Symbicort Thinks that certain foods make that worse and might need a swallow study? Also struggles when she talks a lot of sings at church Is keeping food log on what foods make things worse  Yogurt, frosted shredded wheat, PB crackers,   [place] felt good Has not been feeling as hungry, probably is more stressed about OSA diagnosis  Also walked twice   With diagnosis did not binge.  That's huge! She has a more positive attitude   Dietary assessment:  Safe foods include: donuts, pizza, mt dew, chocolate candy bars, pasta Fear foods: none Avoided foods include:fruits, spicy foods, rice  24 hour recall:  B: frosted mini wheats L: 3 slices [place] on white bread, honey bun, 2 choclate pudding S: grazes on [place] throughout D: ramen noodles, peach, green beans       Nutrition Diagnosis: NB-1.5 Disordered eating pattern As related to binge eating.  As evidenced by dietary recall.  Intervention/Goals: nutrition counseling provided.  Suggested RPC since Florentina Jenny is too expensive Discussed HAES principles.  Created grocery list and suggested food resources   Monitoring and Evaluation: Patient will follow up in 2 weeks.

## 2017-01-09 NOTE — Telephone Encounter (Signed)
90 day supply with 1 refill has been given. Recall placed in epic for 6 month office visit.

## 2017-01-12 ENCOUNTER — Encounter: Payer: Self-pay | Admitting: *Deleted

## 2017-01-13 DIAGNOSIS — G4733 Obstructive sleep apnea (adult) (pediatric): Secondary | ICD-10-CM | POA: Diagnosis not present

## 2017-01-15 ENCOUNTER — Encounter: Payer: Self-pay | Admitting: Pulmonary Disease

## 2017-01-15 ENCOUNTER — Telehealth: Payer: Self-pay | Admitting: Pulmonary Disease

## 2017-01-15 DIAGNOSIS — R053 Chronic cough: Secondary | ICD-10-CM

## 2017-01-15 DIAGNOSIS — R05 Cough: Secondary | ICD-10-CM

## 2017-01-15 NOTE — Telephone Encounter (Signed)
lmomtcb x1 

## 2017-01-15 NOTE — Telephone Encounter (Signed)
As per my note/plan. Please place a referral to ENT for evaluate and direct laryngoscopy given her persistent cough. Thanks.

## 2017-01-15 NOTE — Telephone Encounter (Signed)
Patient returning call - pt also sent email about this - pt can be reached at [phone] -pr

## 2017-01-15 NOTE — Telephone Encounter (Signed)
Spoke with patient. She stated that she has used her Symbicort 160 sample for the past 2 weeks. She stated that her symptoms have stayed the same, using the CPAP machine at night after the Symbicort has caused her cough to go away at night. But she is still having a cough during the day.   She wants to know if you want her to stay on the Symbicort or switch to something else.   Pt wishes to use Krist[facility] Oppenheim on [address] [employer].   JN, please advise. Thanks!

## 2017-01-15 NOTE — Telephone Encounter (Signed)
ATC patient. Call would not go through. Will call back later.

## 2017-01-15 NOTE — Telephone Encounter (Signed)
JN  Please Advise-please see pt email

## 2017-01-16 ENCOUNTER — Encounter: Payer: Self-pay | Admitting: Pulmonary Disease

## 2017-01-16 NOTE — Telephone Encounter (Signed)
Sorry. I was confused from the earlier message. I thought the cough never resolved on the Symbicort. Yes please continue to use the Symbicort inhaler - provide her with sufficient refills for 6 months please and if her cough totally resolved on the Symbicort then you can cancel the ENT referral. Thanks.

## 2017-01-16 NOTE — Telephone Encounter (Signed)
sorry to keep bothering you, but I know I was to only use that Symbort inhaler for one week, and that one week ended on Tuesday 17th.  but since Tuesday been back coughing so bad, so, is it okay that I used that Symbort until the number shows zero ? ( 2 puffs in am and 2 puffs at night.  because it works so much better then my Albuterol regular inhaler?     JN please advise if the pt should continue the symbicort since when she stops this the cough comes back.  Thanks

## 2017-01-19 MED ORDER — BUDESONIDE-FORMOTEROL FUMARATE 160-4.5 MCG/ACT IN AERO: 2.0000 | INHALATION_SPRAY | Freq: Two times a day (BID) | RESPIRATORY_TRACT | 5 refills | Status: DC

## 2017-01-19 NOTE — Telephone Encounter (Signed)
Patient called back wanted to talk to nurse and make sure she is to keep ENT appt - pt can be reached at [phone] -pr

## 2017-01-19 NOTE — Addendum Note (Signed)
Addended by: Len Blalock on: 01/19/2017 03:26 PM   Modules accepted: Orders

## 2017-01-19 NOTE — Telephone Encounter (Signed)
Called and spoke with pt and she is aware of the referral that has been placed for ENT eval. Nothing further is needed.

## 2017-01-22 ENCOUNTER — Encounter: Payer: Commercial Managed Care - PPO | Admitting: *Deleted

## 2017-01-22 DIAGNOSIS — Z713 Dietary counseling and surveillance: Secondary | ICD-10-CM | POA: Diagnosis not present

## 2017-01-22 DIAGNOSIS — E119 Type 2 diabetes mellitus without complications: Secondary | ICD-10-CM | POA: Diagnosis not present

## 2017-01-22 DIAGNOSIS — K5732 Diverticulitis of large intestine without perforation or abscess without bleeding: Secondary | ICD-10-CM

## 2017-01-22 DIAGNOSIS — K644 Residual hemorrhoidal skin tags: Secondary | ICD-10-CM

## 2017-01-22 DIAGNOSIS — K219 Gastro-esophageal reflux disease without esophagitis: Secondary | ICD-10-CM

## 2017-01-22 DIAGNOSIS — F502 Bulimia nervosa: Secondary | ICD-10-CM | POA: Diagnosis not present

## 2017-01-22 NOTE — Progress Notes (Signed)
Appointment start time: 1630  Appointment end time: 1715  Patient was seen on 01/22/17 for nutrition counseling pertaining to disordered eating   Assessment Coughing is much better due to using CPap.  Thinks she has more energy and feels better.   Has not been eating as many sweets. Doesn't feel deprived  [place] [employer] and that lasted longer than usual  blood glucose was normal for the first time in months Doesn't feel as compelled to over eat or indulge.  Doesn't feel as overwhelmed Is feeling a little better about relationships with food Has appointment with Florentina Jenny tomorrow   Dietary assessment:  Safe foods include: donuts, pizza, mt dew, chocolate candy bars, pasta Fear foods: none Avoided foods include:fruits, spicy foods, rice  24 hour recall:  B: frosted flakes L: 2 boiled eggs, frozen smart one S: Nabs D: Frozen enchiladas, green beans S: PB sandwich Beverages: mt dew, apple juice      Nutrition Diagnosis: NB-1.5 Disordered eating pattern As related to binge eating.  As evidenced by dietary recall.  Intervention/Goals: nutrition counseling provided.  Praised progress.  Please get in more water Gave meal planning worksheet  Monitoring and Evaluation: Patient will follow up in 6 weeks.

## 2017-01-23 ENCOUNTER — Telehealth: Payer: Self-pay | Admitting: Family Medicine

## 2017-01-23 NOTE — Telephone Encounter (Signed)
I spoke with pt, last couple of days sugar has been going up and down. Two of readings were 239 evenings and at night. Per Dr. Sarajane Jews pt should watch carb intake and schedule office visit for next week.

## 2017-01-23 NOTE — Telephone Encounter (Signed)
I spoke with pt, please see previous note.

## 2017-01-23 NOTE — Telephone Encounter (Signed)
Pt called in concerned because she checked her blood sugar level and it was running at 235. She stated that she checked it yesterday and it was running 239. She described some facial discomfort, denied dizziness,chest pain.    Forwarding the message to Sunday Spillers to document since she has spoken with the patient

## 2017-01-29 ENCOUNTER — Encounter: Payer: Self-pay | Admitting: *Deleted

## 2017-02-02 ENCOUNTER — Encounter: Payer: Self-pay | Admitting: Pulmonary Disease

## 2017-02-04 ENCOUNTER — Ambulatory Visit (INDEPENDENT_AMBULATORY_CARE_PROVIDER_SITE_OTHER): Payer: Commercial Managed Care - PPO | Admitting: Family Medicine

## 2017-02-04 ENCOUNTER — Encounter: Payer: Self-pay | Admitting: Family Medicine

## 2017-02-04 VITALS — BP 152/94 | HR 78 | Temp 98.4°F | Wt 254.0 lb

## 2017-02-04 DIAGNOSIS — I10 Essential (primary) hypertension: Secondary | ICD-10-CM

## 2017-02-04 DIAGNOSIS — Z9989 Dependence on other enabling machines and devices: Secondary | ICD-10-CM

## 2017-02-04 DIAGNOSIS — R053 Chronic cough: Secondary | ICD-10-CM

## 2017-02-04 DIAGNOSIS — G4733 Obstructive sleep apnea (adult) (pediatric): Secondary | ICD-10-CM | POA: Diagnosis not present

## 2017-02-04 DIAGNOSIS — R05 Cough: Secondary | ICD-10-CM

## 2017-02-04 NOTE — Progress Notes (Signed)
   Subjective:    Patient ID: Morgan [name], female    DOB: 03/01/1966, 51 y.o.   MRN: [id number]  HPI Here with concerns about her BP. Her HTN had been well controlled until about a month ago. She gained a little weight and then her BP went up to the 150s over 90s at home. No chest pain or SOB, but she often feels flushed in the face and feels a pressure in her neck when the BP goes up. Her glucoses have been quite reasonable with am fasting values around 120. She recently started using a CPAP at night, and she feels more energy than she used to. She also no longer coughs like she used to.    Review of Systems  Constitutional: Negative.   HENT: Negative.   Eyes: Negative.   Respiratory: Negative.   Neurological: Negative.        Objective:   Physical Exam  Constitutional: She is oriented to person, place, and time. She appears well-developed and well-nourished.  HENT:  Right Ear: External ear normal.  Left Ear: External ear normal.  Nose: Nose normal.  Mouth/Throat: Oropharynx is clear and moist.  Eyes: Conjunctivae are normal.  Neck: No thyromegaly present.  Cardiovascular: Normal rate, regular rhythm, normal heart sounds and intact distal pulses.   Pulmonary/Chest: Effort normal and breath sounds normal. No respiratory distress. She has no wheezes. She has no rales.  Musculoskeletal: She exhibits no edema.  Lymphadenopathy:    She has no cervical adenopathy.  Neurological: She is alert and oriented to person, place, and time.          Assessment & Plan:  HTN. We will increase the Metoprolol tartrate to 50 mg bid and she will check her pressures several times a dayl. Report back in 2 weeks.  [name], MD

## 2017-02-09 ENCOUNTER — Encounter: Payer: Self-pay | Admitting: Family Medicine

## 2017-02-10 ENCOUNTER — Telehealth: Payer: Self-pay | Admitting: Family Medicine

## 2017-02-10 NOTE — Telephone Encounter (Signed)
Pt state that she is doing good on the metoprolol tartrate 50 MG  Pharm:  [facility]

## 2017-02-10 NOTE — Telephone Encounter (Signed)
That sounds great. Call in Metoprolol tartrate 50 mg bid, #60 with 11 rf

## 2017-02-10 NOTE — Telephone Encounter (Signed)
Duplicate note

## 2017-02-11 MED ORDER — METOPROLOL TARTRATE 50 MG PO TABS: 50.0000 mg | ORAL_TABLET | Freq: Two times a day (BID) | ORAL | 11 refills | Status: DC

## 2017-02-11 NOTE — Telephone Encounter (Signed)
I sent new script e-scribe to [facility], removed previous dose from chart and left pt a voice message with this information.

## 2017-02-11 NOTE — Telephone Encounter (Signed)
° ° °  Pt called to ask when the below med will be called in .    Metoprolol tartrate 50mg    [place]

## 2017-02-12 ENCOUNTER — Encounter: Payer: Self-pay | Admitting: Pulmonary Disease

## 2017-02-13 DIAGNOSIS — G4733 Obstructive sleep apnea (adult) (pediatric): Secondary | ICD-10-CM | POA: Diagnosis not present

## 2017-02-14 ENCOUNTER — Other Ambulatory Visit: Payer: Self-pay | Admitting: Family Medicine

## 2017-02-18 DIAGNOSIS — G4733 Obstructive sleep apnea (adult) (pediatric): Secondary | ICD-10-CM | POA: Diagnosis not present

## 2017-02-24 ENCOUNTER — Telehealth: Payer: Self-pay | Admitting: Pulmonary Disease

## 2017-02-24 NOTE — Telephone Encounter (Signed)
CPAP COMPLIANCE DOWNLOAD 07/18 - 02/12/17:  CPAP set to 15 cm H2O. 100% usage with average 7 hours 49 minutes on days used. Residual AHI 0.4 events/hour.

## 2017-02-26 ENCOUNTER — Encounter: Payer: Self-pay | Admitting: Pulmonary Disease

## 2017-02-27 ENCOUNTER — Encounter: Payer: Self-pay | Admitting: Pulmonary Disease

## 2017-02-27 ENCOUNTER — Ambulatory Visit (INDEPENDENT_AMBULATORY_CARE_PROVIDER_SITE_OTHER): Payer: Commercial Managed Care - PPO | Admitting: Pulmonary Disease

## 2017-02-27 VITALS — BP 116/68 | HR 63 | Ht 63.5 in | Wt 251.0 lb

## 2017-02-27 DIAGNOSIS — Z9989 Dependence on other enabling machines and devices: Secondary | ICD-10-CM

## 2017-02-27 DIAGNOSIS — G4733 Obstructive sleep apnea (adult) (pediatric): Secondary | ICD-10-CM | POA: Diagnosis not present

## 2017-02-27 DIAGNOSIS — Z579 Occupational exposure to unspecified risk factor: Secondary | ICD-10-CM | POA: Diagnosis not present

## 2017-02-27 DIAGNOSIS — K219 Gastro-esophageal reflux disease without esophagitis: Secondary | ICD-10-CM

## 2017-02-27 DIAGNOSIS — R05 Cough: Secondary | ICD-10-CM | POA: Diagnosis not present

## 2017-02-27 DIAGNOSIS — R053 Chronic cough: Secondary | ICD-10-CM

## 2017-02-27 DIAGNOSIS — J45909 Unspecified asthma, uncomplicated: Secondary | ICD-10-CM

## 2017-02-27 NOTE — Progress Notes (Signed)
Subjective:    Patient ID: Morgan [name], female    DOB: 25-Oct-1965, 51 y.o.   MRN: [id number]  C.C.:  Follow-up for Severe OSA, Chronic Cough/Occupational Asthma, & GERD.  HPI Severe OSA: Previously started on CPAP at 15 cm H2O. Compliance download reviewed with excellent adherence and minimal residual AHI. She reports her sleep quality is excellent.   Chronic cough/occupational asthma: Previous negative methacholine challenge in 2013. Exposure through work. She reports she has had no coughing. Haven't needed her rescue inhaler or Symbicort since she started on CPAP therapy. No new dyspnea.   GERD: Previously on Prilosec twice a day with intermittent dysphagia and gagging. She reports her dysphagia has resolved. No reflux or dyspepsia.   Review of Systems  No chest pain or pressure. No fever or chills. No rashes or bruising.   Allergies  Allergen Reactions  . Lisinopril Cough    Current Outpatient Prescriptions on File Prior to Visit  Medication Sig Dispense Refill  . albuterol (PROVENTIL HFA;VENTOLIN HFA) 108 (90 Base) MCG/ACT inhaler Inhale 2 puffs into the lungs every 6 (six) hours as needed for wheezing or shortness of breath. 1 Inhaler 6  . atorvastatin (LIPITOR) 40 MG tablet TAKE ONE TABLET BY MOUTH DAILY 30 tablet 2  . budesonide-formoterol (SYMBICORT) 160-4.5 MCG/ACT inhaler Inhale 2 puffs into the lungs 2 (two) times daily. 1 Inhaler 5  . dicyclomine (BENTYL) 20 MG tablet Take 1 tablet (20 mg total) by mouth every 8 (eight) hours as needed (bowel cramps). 90 tablet 5  . glipiZIDE (GLUCOTROL) 10 MG tablet TAKE 1 TABLET (10 MG TOTAL) BY MOUTH 2 (TWO) TIMES DAILY BEFORE A MEAL. 60 tablet 11  . losartan-hydrochlorothiazide (HYZAAR) 50-12.5 MG tablet TAKE ONE TABLET BY MOUTH DAILY 30 tablet 2  . meloxicam (MOBIC) 15 MG tablet Take 1 tablet (15 mg total) by mouth daily. 30 tablet 11  . metFORMIN (GLUCOPHAGE) 1000 MG tablet TAKE 1 TABLET (1,000 MG TOTAL) BY MOUTH 2 (TWO) TIMES  DAILY WITH A MEAL. 60 tablet 6  . metoprolol tartrate (LOPRESSOR) 50 MG tablet Take 1 tablet (50 mg total) by mouth 2 (two) times daily. 60 tablet 11  . omeprazole (PRILOSEC) 40 MG capsule TAKE ONE CAPSULE BY MOUTH TWICE A DAY 180 capsule 1   No current facility-administered medications on file prior to visit.     Past Medical History:  Diagnosis Date  . Anal fissure    saw Dr. Mercy Riding   . Arthritis   . Diabetes mellitus   . GERD (gastroesophageal reflux disease)   . History of ovarian cancer in adulthood   . Hyperlipidemia   . Hypertension   . Ovarian cancer ([place])   . Skin cancer (melanoma) ([place])   . Tubular adenoma of colon 01/2014    Past Surgical History:  Procedure Laterality Date  . ABDOMINAL HYSTERECTOMY  1986   BSO  . BASAL CELL CARCINOMA EXCISION  10-09-12   lower lip per Dr. Link Snuffer   . COLONOSCOPY  02-17-14   per Dr. Olevia Perches, adenomatous polyps, repeat in 5 yrs   . GLBS tumor ovary 1986    . HEMORRHOID SURGERY    . HIP FRACTURE SURGERY    . MELANOMA EXCISION  1997   upper chest     Family History  Problem Relation Age of Onset  . Ovarian cancer Sister   . Asthma Mother   . Arthritis Unknown   . Diabetes Unknown   . Prostate cancer Unknown   .  Breast cancer Unknown   . Coronary artery disease Unknown   . Colon cancer Paternal Grandfather   . Pancreatic cancer Neg Hx   . Stomach cancer Neg Hx     Social History   Social History  . Marital status: Single    Spouse name: N/A  . Number of children: N/A  . 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 Smoking status: Never Smoker  . Smokeless tobacco: Never Used  . Alcohol use No  . Drug use: No  . Sexual activity: Not Asked   Other Topics Concern  . None   Social History Narrative   Originally from [place], [place], & moved to [place] at 51 y.o. She reports she started a new job in July 2016. 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 No pets currently. No bird, mold, or hot tub exposure.       Objective:   Physical Exam BP 116/68 (BP Location: Left Arm, Cuff Size: Normal)   Pulse 63   Ht 5' 3.5" (1.613 m)   Wt 251 lb (113.9 kg)   SpO2 96%   BMI 43.77 kg/m   General:  Morbidly obese. No distress. Comfortable.  Integument:  Warm & dry. No rash on exposed skin. No bruising on exposed skin. Extremities:  No cyanosis or clubbing.  HEENT:  No scleral icterus. Minimal nasal turbinate swelling. No oral ulcers Cardiovascular:  Regular rate. No edema. Regular rhythm.  Pulmonary:  Clear bilaterally to auscultation. No accessory muscle use on room air. Abdomen: Soft. Normal bowel sounds. Protuberant. Musculoskeletal:  Normal bulk and tone. No joint deformity or effusion appreciated.  PFT 07/03/16: FVC 2.80 L (77%) FEV1 2.55 L (89%) FEV1/FVC 0.91 FEF 25-75 3.82 L (140%) negative bronchodilator response                                                                    DLCO corrected 87% (Hgb 15.0) 12/25/15: FVC 2.68 L (74%) FEV1 2.44 L (85%) FEV1/FVC 0.91 FEF 25-75 3.43 L (125%) negative bronchodilator response TLC 4.32 L (83%) RV 75% ERV 24% DLCO uncorrected 75% 12/04/11: FVC 2.87 L (77%) FEV1 2.59 L (88%) FEV1/FVC 0.90 FEF 25-75 3.32 L (114%) negative bronchodilator response  CPAP COMPLIANCE DOWNLOAD 07/18 - 02/12/17:  CPAP set to 15 cm H2O. 100% usage with average 7 hours 49 minutes on days used. Residual AHI 0.4 events/hour.  SPLIT NIGHT SLEEP STUDY (12/23/16):  Severe obstructive sleep apnea with AHI 112.1 events/hour with desaturation to 55% during the diagnostic portion of study on room air. Optimal CPAP pressure 15 cm H2O. No cardiac abnormalities or clinically significant periodic limb movements occurred during sleep.  METHACHOLINE CHALLENGE (12/04/11):  Maximum 7% reduction in FEV1 at level V dilution of methacholine. Normal bronchial hyperactivity.  IMAGING CXR PA/LAT 10/24/15 (previously reviewed by me):  No parenchymal nodule or opacity. No pleural effusion. Heart normal in size & mediastinum normal in contour.   BARIUM SWALLOW (09/21/15): Normal esophageal motility without aspiration. Small sliding type  hiatal hernia and episodes of spontaneous gastroesophageal reflux noted. No esophageal mass or stricture.  CARDIAC TTE (08/17/08): LV normal in size with EF 60%. No regional wall motion abnormalities. LA & RA normal in size. RV normal in size and function. No aortic stenosis or regurgitation. No mitral stenosis or regurgitation. No tricuspid stenosis or regurgitation. No pericardial effusion.    Assessment & Plan:  51 y.o. female with severe OSA. Patient's occupational asthma and reflux as well as dysphagia have significantly improved with the use of CPAP therapy. I do feel that her sleep apnea was a significant contributing factor given the improvement in her symptoms with her underlying occupational asthma. Reviewing her CPAP download shows excellent adherence and minimal residual AHI. We reviewed her mask fit and function today. I instructed the patient to contact me if she had any problems with her machine or new breathing problems before her next appointment.  1. Severe OSA: Continuing on CPAP indefinitely. Checking overnight oximetry on CPAP therapy and room air. 2. Chronic cough/occupational asthma: Holding off on Symbicort. Continuing albuterol inhaler as needed. 3. Cervical dysplasia: Resolved. No further testing. 4. GERD: Asymptomatic on Prilosec. No changes. 5. Health maintenance: Recommended influenza vaccine next month. 6. Follow-up: Return to clinic in 1 year or sooner if needed.   Sonia Baller Ashok Cordia, M.D. [facility] Pager:  [phone] After 3pm or if no response, call [phone] 9:01 AM 02/27/17

## 2017-02-27 NOTE — Patient Instructions (Signed)
   Keep using your CPAP.   We will do an overnight oxygen test to make sure everything is ok.  Continue using your Albuterol inhaler as needed but you can hold off on the Symbicort.  We will see you back in 1 year or sooner if needed.  Let us know if you need anything before your next appointment.  TESTS ORDERED: 1. ONO on CPAP

## 2017-02-28 ENCOUNTER — Other Ambulatory Visit: Payer: Self-pay | Admitting: Family Medicine

## 2017-03-04 DIAGNOSIS — R0902 Hypoxemia: Secondary | ICD-10-CM | POA: Diagnosis not present

## 2017-03-04 DIAGNOSIS — J449 Chronic obstructive pulmonary disease, unspecified: Secondary | ICD-10-CM | POA: Diagnosis not present

## 2017-03-05 ENCOUNTER — Encounter: Payer: Self-pay | Admitting: Pulmonary Disease

## 2017-03-05 ENCOUNTER — Encounter: Payer: Self-pay | Admitting: *Deleted

## 2017-03-05 ENCOUNTER — Ambulatory Visit: Payer: Self-pay | Admitting: *Deleted

## 2017-03-05 NOTE — Telephone Encounter (Signed)
FYI for JN that the pt will be returning the sleep machine today.

## 2017-03-16 ENCOUNTER — Telehealth: Payer: Self-pay | Admitting: Pulmonary Disease

## 2017-03-16 DIAGNOSIS — G4733 Obstructive sleep apnea (adult) (pediatric): Secondary | ICD-10-CM | POA: Diagnosis not present

## 2017-03-16 NOTE — Telephone Encounter (Signed)
OVERNIGHT OXIMETRY 03/04/17:  Performed on CPAP with room air. 7 hours 14 minutes of analyzed data. Lowest saturation 87%. Patient spent 4 seconds with saturation </= 88% and 40 seconds with saturation </= 89%. 7 hours 14 minutes was spent with a saturation >/= 90%.

## 2017-03-19 ENCOUNTER — Encounter: Payer: Self-pay | Admitting: Family Medicine

## 2017-03-24 ENCOUNTER — Encounter: Payer: Self-pay | Admitting: Pulmonary Disease

## 2017-03-31 ENCOUNTER — Encounter: Payer: Self-pay | Admitting: Pulmonary Disease

## 2017-04-01 ENCOUNTER — Encounter: Payer: Self-pay | Admitting: Family Medicine

## 2017-04-01 ENCOUNTER — Encounter: Payer: Self-pay | Admitting: Pulmonary Disease

## 2017-04-15 DIAGNOSIS — G4733 Obstructive sleep apnea (adult) (pediatric): Secondary | ICD-10-CM | POA: Diagnosis not present

## 2017-04-22 ENCOUNTER — Encounter: Payer: Self-pay | Admitting: Pulmonary Disease

## 2017-04-22 NOTE — Telephone Encounter (Signed)
Please get a download from her CPAP. Thanks.

## 2017-04-22 NOTE — Telephone Encounter (Signed)
Dr. Ashok Cordia, please advise. Thanks!

## 2017-04-28 ENCOUNTER — Other Ambulatory Visit: Payer: Self-pay | Admitting: Family Medicine

## 2017-05-02 ENCOUNTER — Other Ambulatory Visit: Payer: Self-pay | Admitting: Family Medicine

## 2017-05-04 ENCOUNTER — Encounter: Payer: Self-pay | Admitting: Family Medicine

## 2017-05-04 DIAGNOSIS — E119 Type 2 diabetes mellitus without complications: Secondary | ICD-10-CM

## 2017-05-04 DIAGNOSIS — E782 Mixed hyperlipidemia: Secondary | ICD-10-CM

## 2017-05-04 NOTE — Telephone Encounter (Signed)
Patient called back regarding refill. [employer] states they must have provider approval

## 2017-05-06 NOTE — Telephone Encounter (Signed)
She can make a lab appt only. I put in the orders

## 2017-05-11 ENCOUNTER — Other Ambulatory Visit: Payer: Commercial Managed Care - PPO

## 2017-05-12 ENCOUNTER — Other Ambulatory Visit (INDEPENDENT_AMBULATORY_CARE_PROVIDER_SITE_OTHER): Payer: Commercial Managed Care - PPO

## 2017-05-12 ENCOUNTER — Encounter: Payer: Self-pay | Admitting: Family Medicine

## 2017-05-12 DIAGNOSIS — E119 Type 2 diabetes mellitus without complications: Secondary | ICD-10-CM

## 2017-05-12 DIAGNOSIS — E782 Mixed hyperlipidemia: Secondary | ICD-10-CM

## 2017-05-12 LAB — HEPATIC FUNCTION PANEL
ALT: 40 U/L — ABNORMAL HIGH (ref 0–35)
AST: 29 U/L (ref 0–37)
Albumin: 4.1 g/dL (ref 3.5–5.2)
Alkaline Phosphatase: 75 U/L (ref 39–117)
Bilirubin, Direct: 0.1 mg/dL (ref 0.0–0.3)
Total Bilirubin: 0.6 mg/dL (ref 0.2–1.2)
Total Protein: 6.9 g/dL (ref 6.0–8.3)

## 2017-05-12 LAB — LIPID PANEL
Cholesterol: 159 mg/dL (ref 0–200)
HDL: 35.6 mg/dL — ABNORMAL LOW (ref >39.00)
NonHDL: 123.01
Total CHOL/HDL Ratio: 4
Triglycerides: 311 mg/dL — ABNORMAL HIGH (ref 0.0–149.0)
VLDL: 62.2 mg/dL — ABNORMAL HIGH (ref 0.0–40.0)

## 2017-05-12 LAB — HEMOGLOBIN A1C: Hgb A1c MFr Bld: 6.9 % — ABNORMAL HIGH (ref 4.6–6.5)

## 2017-05-12 LAB — LDL CHOLESTEROL, DIRECT: Direct LDL: 83 mg/dL

## 2017-05-13 ENCOUNTER — Encounter: Payer: Self-pay | Admitting: Family Medicine

## 2017-05-13 NOTE — Telephone Encounter (Signed)
Tell her the lipitor is primarily supposed to target the LDL, and it is doing a great job of this. For the TG, just watch the diet like she is doing

## 2017-05-13 NOTE — Telephone Encounter (Signed)
Have her make an OV so I can talk to her about this

## 2017-05-13 NOTE — Telephone Encounter (Signed)
This is a duplicated message see piror note. This has been addresed.

## 2017-05-27 ENCOUNTER — Ambulatory Visit: Payer: Commercial Managed Care - PPO | Admitting: Family Medicine

## 2017-05-27 ENCOUNTER — Encounter: Payer: Self-pay | Admitting: Family Medicine

## 2017-05-27 ENCOUNTER — Encounter: Payer: Self-pay | Admitting: Pulmonary Disease

## 2017-05-27 VITALS — BP 160/100 | HR 90 | Temp 98.5°F | Wt 252.6 lb

## 2017-05-27 DIAGNOSIS — F411 Generalized anxiety disorder: Secondary | ICD-10-CM | POA: Diagnosis not present

## 2017-05-27 DIAGNOSIS — Z23 Encounter for immunization: Secondary | ICD-10-CM

## 2017-05-27 MED ORDER — SERTRALINE HCL 50 MG PO TABS
50.0000 mg | ORAL_TABLET | Freq: Every day | ORAL | 3 refills | Status: DC
Start: 2017-05-27 — End: 2017-11-02

## 2017-05-27 NOTE — Telephone Encounter (Signed)
JN please see email from the pt and advise if you had a doc in mind you wanted her to see, thanks!  I got a notice that Dr Ashok Cordia will beleaving, I was really upset with that. So , who will I need to see instead ? And will the new doctor know of all my issues that I have?

## 2017-05-27 NOTE — Progress Notes (Signed)
   Subjective:    Patient ID: Morgan [name], female    DOB: 19-Dec-1965, 51 y.o.   MRN: [id number]  HPI Here to talk about anxiety. She has felt very stressed for a few years but lately it has been more of a problem. She worries about things and she has trouble sleeping. Her temper has been short. She does feel sad at times but mostly depression has not been an issue.   Review of Systems  Constitutional: Negative.   Respiratory: Negative.   Cardiovascular: Negative.   Neurological: Negative.   Psychiatric/Behavioral: Positive for agitation and decreased concentration. Negative for confusion, dysphoric mood and hallucinations. The patient is nervous/anxious.        Objective:   Physical Exam  Constitutional: She is oriented to person, place, and time. She appears well-developed and well-nourished.  Cardiovascular: Normal rate, regular rhythm, normal heart sounds and intact distal pulses.  Pulmonary/Chest: Effort normal and breath sounds normal.  Neurological: She is alert and oriented to person, place, and time.  Psychiatric: Her behavior is normal. Thought content normal.  Anxious, tearful           Assessment & Plan:  Anxiety, treat with Zoloft 50 mg daily. I also encouraged her to exercise regularly. Recheck in 3-4 weeks.  [name], MD

## 2017-06-01 ENCOUNTER — Other Ambulatory Visit: Payer: Self-pay | Admitting: Family Medicine

## 2017-06-18 DIAGNOSIS — G4733 Obstructive sleep apnea (adult) (pediatric): Secondary | ICD-10-CM | POA: Diagnosis not present

## 2017-07-06 ENCOUNTER — Encounter: Payer: Self-pay | Admitting: Gastroenterology

## 2017-07-07 ENCOUNTER — Other Ambulatory Visit: Payer: Self-pay | Admitting: Gastroenterology

## 2017-07-14 ENCOUNTER — Ambulatory Visit: Payer: Commercial Managed Care - PPO | Admitting: Nurse Practitioner

## 2017-07-14 ENCOUNTER — Encounter: Payer: Self-pay | Admitting: Nurse Practitioner

## 2017-07-14 VITALS — BP 114/80 | HR 65 | Ht 65.5 in | Wt 252.0 lb

## 2017-07-14 DIAGNOSIS — K5900 Constipation, unspecified: Secondary | ICD-10-CM | POA: Diagnosis not present

## 2017-07-14 DIAGNOSIS — K602 Anal fissure, unspecified: Secondary | ICD-10-CM | POA: Diagnosis not present

## 2017-07-14 MED ORDER — AMBULATORY NON FORMULARY MEDICATION: 1 refills | Status: DC

## 2017-07-14 NOTE — Patient Instructions (Signed)
If you are age 54 or older, your body mass index should be between 23-30. Your Body mass index is 41.3 kg/m. If this is out of the aforementioned range listed, please consider follow up with your Primary Care Provider.  If you are age 58 or younger, your body mass index should be between 19-25. Your Body mass index is 41.3 kg/m. If this is out of the aformentioned range listed, please consider follow up with your Primary Care Provider.   We have sent the following medications to your pharmacy for you to pick up at your convenience: Diltiazem cream  Start Benefiber twice daily. Increase water to 6-8 cups daily. Use stool softener daily. Use Miralax as needed.  Follow up with Tye Savoy, NP in four weeks. We will contact you in four weeks when the schedule becomes available.  Thank you for choosing me and [facility].   Tye Savoy, NP

## 2017-07-14 NOTE — Progress Notes (Addendum)
     Chief Complaint:  Bowel changes / hemorrhoids   HPI: Patient is a 52 year old female known previously to Dr. Olevia Perches, established with Dr. Havery Moros in March 2017 when she saw him for a cough / reflux.  Patient contacted our office on 07/06/17 with complaints of constipation, hemorrhoids, scant rectal bleeding.  She had an appointment to come in on January 23 but was worked in today instead.  Patient gives a history of chronic constipation, basically untreated until just recently.  At home she had been taking stool softeners, MiraLAX, using glycerin suppositories and eating more green vegetables.  This regimen seem to work for a few days but yesterday she had recurrent stool which resembled popcorn.  Bowel movements consist of small crumbly pieces of stool.  Sounds like she has significant straining as stool is difficult to expel. She has anal discomfort with defecation and lately having scant rectal bleeding. She drinks only one bottle of water a day. She doesn't get adequate fiber. She is up to date on colonoscopy Olevia Perches).  Past Medical History:  Diagnosis Date  . Anal fissure    saw Dr. Mercy Riding   . Arthritis   . Diabetes mellitus   . GERD (gastroesophageal reflux disease)   . History of ovarian cancer in adulthood   . Hyperlipidemia   . Hypertension   . Ovarian cancer ([place])   . Skin cancer (melanoma) ([place])   . Tubular adenoma of colon 01/2014    Patient's surgical history, family medical history, social history, medications and allergies were all reviewed in Epic    Physical Exam: BP 114/80   Pulse 65   Ht 5' 5.5" (1.664 m)   Wt 252 lb (114.3 kg)   BMI 41.30 kg/m   GENERAL:  Pleasant obese white female  in NAD PSYCH: :Pleasant, cooperative, normal affect ABDOMEN:  Nondistended, soft, nontender. No obvious masses, no hepatomegaly,  normal bowel sounds RECTAL: I asked Dr. Carlean Purl to help with anoscopy. Anal canal seemed tighter than what I expected and she was  tender.   Anoscope gently inserted and posterior midline fissure seen, as well as internal hemorrhoids.   Musculoskeletal:  Normal muscle tone, normal strength NEURO: Alert and oriented x 3, no focal neurologic deficits   ASSESSMENT and PLAN:  1. Pleasant 52 year old female with chronic constipation, unresolved. She drinks only one bottle of water a day, likely doesn't get adequate fiber. BID miralax worked for a few days but back with small crumbling stools and difficult time with expulsion. Doesn't currently feel constipated, had BMs on Sunday so need to purge  -Increase water intake to 6-8 times a day. -Citrucel bid -Continue glycerin supp as needed, she thinks they are helpful.  -take the Miralax as needed.   2. Posterior midline anal fissure. -diltiazem 2% gel / lidocaine mixture. Apply TID as I instructed (up to first knuckle) for 6-8 weeks -recommended warm tub soaks several times a day if possible -DO NOT strain, see #1.  -follow up with me in 4-6 weeks -follow up with   3. Hx of adenomatous colon polyps.  On 5 year recall for Aug 2020.   Tye Savoy , NP 07/14/2017, 10:29 AM   Agree with Ms. Cameshia Cressman's assessment and plan. Gatha Mayer, MD, [facility]

## 2017-07-17 ENCOUNTER — Encounter: Payer: Self-pay | Admitting: Family Medicine

## 2017-07-17 ENCOUNTER — Telehealth: Payer: Self-pay | Admitting: Nurse Practitioner

## 2017-07-17 ENCOUNTER — Encounter: Payer: Self-pay | Admitting: Nurse Practitioner

## 2017-07-17 NOTE — Telephone Encounter (Signed)
Patient is complaining of sharp lower back pain. Has to stop when walking because of her pain. Denies numbness or tingling of her lower extremities. Not incontinent of bowels or bladder. This is a new issue. Encouraged to contact her PCP with this information.

## 2017-07-22 ENCOUNTER — Ambulatory Visit: Payer: Self-pay | Admitting: Gastroenterology

## 2017-07-28 ENCOUNTER — Other Ambulatory Visit: Payer: Self-pay | Admitting: Family Medicine

## 2017-07-31 ENCOUNTER — Institutional Professional Consult (permissible substitution): Payer: Self-pay | Admitting: Pulmonary Disease

## 2017-08-13 ENCOUNTER — Encounter: Payer: Self-pay | Admitting: Nurse Practitioner

## 2017-08-13 ENCOUNTER — Ambulatory Visit: Payer: Commercial Managed Care - PPO | Admitting: Nurse Practitioner

## 2017-08-13 VITALS — BP 120/78 | HR 70 | Ht 65.0 in | Wt 251.0 lb

## 2017-08-13 DIAGNOSIS — K602 Anal fissure, unspecified: Secondary | ICD-10-CM | POA: Diagnosis not present

## 2017-08-13 DIAGNOSIS — K648 Other hemorrhoids: Secondary | ICD-10-CM

## 2017-08-13 MED ORDER — HYDROCORTISONE 2.5 % RE CREA: 1.0000 "application " | TOPICAL_CREAM | Freq: Two times a day (BID) | RECTAL | 1 refills | Status: DC

## 2017-08-13 NOTE — Patient Instructions (Addendum)
If you are age 53 or older, your body mass index should be between 23-30. Your Body mass index is 41.77 kg/m. If this is out of the aforementioned range listed, please consider follow up with your Primary Care Provider.  If you are age 44 or younger, your body mass index should be between 19-25. Your Body mass index is 41.77 kg/m. If this is out of the aformentioned range listed, please consider follow up with your Primary Care Provider.   We have sent the following medications to your pharmacy for you to pick up at your convenience: Anusol cream  Complete 4 additional weeks of fissure ointment.  Follow up with Tye Savoy, NP on September 07, 2017 at 1:30 pm.  Thank you for choosing me and [facility].   Tye Savoy, NP

## 2017-08-13 NOTE — Progress Notes (Signed)
      IMPRESSION and PLAN:    1. 52 yo female with posterior midline fissure, improving after four weeks of Diltiazem gel.  -Continue diltiazem gel as directed 3 times daily -avoid straining  2. Constipation, significantly improved.  -Continue current bowel regimen to keep stools soft and avoid straining  3.  Thrombosed internal hemorrhoid.  -Anusol cream BID inside rectum BID. -follow up with me in ~ 4 weeks to ensue resolution of fissure and thrombosed hemorrhoid    HPI:    Chief Complaint: follow up anal fissure   Patient is a 52 year old female known to Dr. Havery Moros.  I saw her a few weeks ago with rectal pain in setting of constipation.  On exam she was found to have a posterior midline.  She was prescribed gel 3 times daily.  For constipation we started twice daily Citrucel, increased water intake, and miralax as needed.She has also added in a stool softener.  Sreshta is back for follow up as requested. Her constipation significantly improved.  She still has some discomfort as stool was being expelled but feels that her symptoms have overall significantly improved.    Past Medical History:  Diagnosis Date  . Anal fissure    saw Dr. Mercy Riding   . Arthritis   . Diabetes mellitus   . GERD (gastroesophageal reflux disease)   . History of ovarian cancer in adulthood   . Hyperlipidemia   . Hypertension   . Ovarian cancer ([place])   . Skin cancer (melanoma) ([place])   . Tubular adenoma of colon 01/2014    Patient's surgical history, family medical history, social history, medications and allergies were all reviewed in Epic    Review of systems:  No abdominal pain, no rectal bleeding.   Physical Exam:     BP 120/78   Pulse 70   Ht 5\' 5"  (1.651 m)   Wt 251 lb (113.9 kg)   BMI 41.77 kg/m   GENERAL:  Pleasant white female in NAD PSYCH: : cooperative, normal affect RECTAl: No external lesions.  I was able to perform a DRE without significant discomfort to her.   She has a thrombosed internal hemorrhoid.  The posterior midline fissure is healing. NEURO: Alert and oriented x 3, no focal neurologic deficits   Tye Savoy , NP 08/13/2017, 1:54 PM

## 2017-08-16 ENCOUNTER — Encounter: Payer: Self-pay | Admitting: Nurse Practitioner

## 2017-08-17 NOTE — Progress Notes (Signed)
Agree with assessment and plan as outlined. Glad to hear she is better. She is due for surveillance colonoscopy in 01/2019 due to history of adenomas.

## 2017-08-21 ENCOUNTER — Encounter: Payer: Self-pay | Admitting: Nurse Practitioner

## 2017-08-21 DIAGNOSIS — K626 Ulcer of anus and rectum: Secondary | ICD-10-CM | POA: Diagnosis not present

## 2017-08-25 DIAGNOSIS — Z85820 Personal history of malignant melanoma of skin: Secondary | ICD-10-CM | POA: Diagnosis not present

## 2017-08-25 DIAGNOSIS — D225 Melanocytic nevi of trunk: Secondary | ICD-10-CM | POA: Diagnosis not present

## 2017-08-25 DIAGNOSIS — Z85828 Personal history of other malignant neoplasm of skin: Secondary | ICD-10-CM | POA: Diagnosis not present

## 2017-09-07 ENCOUNTER — Ambulatory Visit: Payer: Self-pay | Admitting: Nurse Practitioner

## 2017-09-08 ENCOUNTER — Ambulatory Visit: Payer: Self-pay | Admitting: Nurse Practitioner

## 2017-09-17 ENCOUNTER — Encounter: Payer: Self-pay | Admitting: Family Medicine

## 2017-10-02 DIAGNOSIS — G4733 Obstructive sleep apnea (adult) (pediatric): Secondary | ICD-10-CM | POA: Diagnosis not present

## 2017-10-07 ENCOUNTER — Other Ambulatory Visit: Payer: Self-pay | Admitting: Gastroenterology

## 2017-10-07 ENCOUNTER — Telehealth: Payer: Self-pay | Admitting: Nurse Practitioner

## 2017-10-07 ENCOUNTER — Encounter: Payer: Self-pay | Admitting: Family Medicine

## 2017-10-07 ENCOUNTER — Encounter: Payer: Self-pay | Admitting: Nurse Practitioner

## 2017-10-07 ENCOUNTER — Other Ambulatory Visit: Payer: Self-pay

## 2017-10-07 MED ORDER — OMEPRAZOLE 40 MG PO CPDR: 40.0000 mg | DELAYED_RELEASE_CAPSULE | Freq: Two times a day (BID) | ORAL | 1 refills | Status: DC

## 2017-10-07 NOTE — Telephone Encounter (Signed)
Called and spoke to pt.  Per Dr. Loni Muse she needs to have an OV to discuss her PPI but OK to give refills until she can get in.  She expressed understanding.  Scheduled patient for OV with Dr. Loni Muse for May 23.  Sent refills.

## 2017-10-26 ENCOUNTER — Other Ambulatory Visit: Payer: Self-pay | Admitting: Family Medicine

## 2017-11-02 ENCOUNTER — Other Ambulatory Visit: Payer: Self-pay | Admitting: Family Medicine

## 2017-11-12 ENCOUNTER — Encounter: Payer: Self-pay | Admitting: Family Medicine

## 2017-11-18 ENCOUNTER — Encounter: Payer: Self-pay | Admitting: Family Medicine

## 2017-11-19 ENCOUNTER — Encounter: Payer: Self-pay | Admitting: Gastroenterology

## 2017-11-19 ENCOUNTER — Ambulatory Visit: Payer: Commercial Managed Care - PPO | Admitting: Gastroenterology

## 2017-11-19 ENCOUNTER — Other Ambulatory Visit (INDEPENDENT_AMBULATORY_CARE_PROVIDER_SITE_OTHER): Payer: Self-pay

## 2017-11-19 VITALS — BP 110/74 | HR 76 | Ht 64.5 in | Wt 244.4 lb

## 2017-11-19 DIAGNOSIS — K219 Gastro-esophageal reflux disease without esophagitis: Secondary | ICD-10-CM

## 2017-11-19 DIAGNOSIS — Z86010 Personal history of colonic polyps: Secondary | ICD-10-CM | POA: Diagnosis not present

## 2017-11-19 DIAGNOSIS — D649 Anemia, unspecified: Secondary | ICD-10-CM

## 2017-11-19 LAB — CBC WITH DIFFERENTIAL/PLATELET
Basophils Absolute: 0.1 10*3/uL (ref 0.0–0.1)
Basophils Relative: 0.9 % (ref 0.0–3.0)
Eosinophils Absolute: 0.1 10*3/uL (ref 0.0–0.7)
Eosinophils Relative: 0.5 % (ref 0.0–5.0)
HCT: 37.2 % (ref 36.0–46.0)
Hemoglobin: 12.5 g/dL (ref 12.0–15.0)
Lymphocytes Relative: 27.5 % (ref 12.0–46.0)
Lymphs Abs: 3 10*3/uL (ref 0.7–4.0)
MCHC: 33.7 g/dL (ref 30.0–36.0)
MCV: 84.8 fl (ref 78.0–100.0)
Monocytes Absolute: 0.5 10*3/uL (ref 0.1–1.0)
Monocytes Relative: 4.8 % (ref 3.0–12.0)
Neutro Abs: 7.1 10*3/uL (ref 1.4–7.7)
Neutrophils Relative %: 66.3 % (ref 43.0–77.0)
Platelets: 369 10*3/uL (ref 150.0–400.0)
RBC: 4.39 Mil/uL (ref 3.87–5.11)
RDW: 16 % — ABNORMAL HIGH (ref 11.5–15.5)
WBC: 10.7 10*3/uL — ABNORMAL HIGH (ref 4.0–10.5)

## 2017-11-19 LAB — FERRITIN: Ferritin: 33.8 ng/mL (ref 10.0–291.0)

## 2017-11-19 MED ORDER — OMEPRAZOLE 40 MG PO CPDR: 40.0000 mg | DELAYED_RELEASE_CAPSULE | Freq: Every day | ORAL | 3 refills | Status: DC

## 2017-11-19 NOTE — Patient Instructions (Addendum)
If you are age 54 or older, your body mass index should be between 23-30. Your Body mass index is 41.3 kg/m. If this is out of the aforementioned range listed, please consider follow up with your Primary Care Provider.  If you are age 14 or younger, your body mass index should be between 19-25. Your Body mass index is 41.3 kg/m. If this is out of the aformentioned range listed, please consider follow up with your Primary Care Provider.   Please go to the lab in the basement of our building to have lab work done as you leave today.  We have sent the following medications to your pharmacy for you to pick up at your convenience: Omeprazole 40 mg: Take once a day.  If needed you may resume twice a day.  You will be due for a recall colonoscopy in August 2020. We will send you a reminder in the mail when it gets closer to that time.  Thank you for entrusting me with your care and for choosing [facility], Dr. Akron Cellar

## 2017-11-19 NOTE — Telephone Encounter (Signed)
Noted  

## 2017-11-19 NOTE — Progress Notes (Signed)
HPI :  52 year old female here for a follow-up visit.  She previously saw [place] for chronic cough and gagging sensation, in the absence of typical reflux symptoms. She was given a trial of omeprazole 40 mg twice daily. She states this helped a lot of her symptoms, along with starting CPAP device for diagnosis of sleep apnea. She states on this regimen her symptoms have been minimal. She had a prior barium swallow in March 2017 showing a small hiatal hernia but no concerning findings otherwise. She states her symptoms in general thought to be related reflux have been ongoing since 2005 or so. She denies any family history of esophagus cancer.  Otherwise she was more recently seen for an anal fissure which is healed with conservative therapy. She is using Benefiber which is keeping the stool soft and preventing straining. She denies any blood in her stools or bowel habit changes. She's had a prior colonoscopy in 2015 due for recall colonoscopy 01/2019 due to history of adenoma x 1 in 2015  Incidentally she attempted to donate blood recently and was told she cannot do so due to anemia. Is not had any further workup for this. He denies any history of known anemia.   Colonoscopy 02/17/2014 - diverticulosis, internal hemorrhoids, 1 small adenoma  Past Medical History:  Diagnosis Date  . Anal fissure    saw Dr. Mercy Riding   . Arthritis   . Diabetes mellitus   . GERD (gastroesophageal reflux disease)   . History of ovarian cancer in adulthood   . Hyperlipidemia   . Hypertension   . Ovarian cancer ([place])   . Skin cancer (melanoma) ([place])   . Tubular adenoma of colon 01/2014     Past Surgical History:  Procedure Laterality Date  . ABDOMINAL HYSTERECTOMY  1986   BSO  . BASAL CELL CARCINOMA EXCISION  10-09-12   lower lip per Dr. Link Snuffer   . COLONOSCOPY  02-17-14   per Dr. Olevia Perches, adenomatous polyps, repeat in 5 yrs   . GLBS tumor ovary 1986    . HEMORRHOID SURGERY    . HIP FRACTURE  SURGERY    . MELANOMA EXCISION  1997   upper chest    Family History  Problem Relation Age of Onset  . Ovarian cancer Sister   . Asthma Mother   . Arthritis Unknown   . Diabetes Unknown   . Prostate cancer Unknown   . Breast cancer Unknown   . Coronary artery disease Unknown   . Colon cancer Paternal Grandfather   . Pancreatic cancer Neg Hx   . Stomach cancer Neg Hx    Social History   Tobacco Use  . Smoking status: Never Smoker  . Smokeless tobacco: Never Used  Substance Use Topics  . Alcohol use: No    Alcohol/week: 0.0 oz  . Drug use: No   Current Outpatient Medications  Medication Sig Dispense Refill  . albuterol (PROVENTIL HFA;VENTOLIN HFA) 108 (90 Base) MCG/ACT inhaler Inhale 2 puffs into the lungs every 6 (six) hours as needed for wheezing or shortness of breath. 1 Inhaler 6  . atorvastatin (LIPITOR) 40 MG tablet TAKE ONE TABLET BY MOUTH DAILY. PATIENT NEEDS APPOINTMENT!!! 30 tablet 6  . budesonide-formoterol (SYMBICORT) 160-4.5 MCG/ACT inhaler Inhale 2 puffs into the lungs 2 (two) times daily. 1 Inhaler 5  . dicyclomine (BENTYL) 20 MG tablet Take 1 tablet (20 mg total) by mouth every 8 (eight) hours as needed (bowel cramps). 90 tablet 5  . glipiZIDE (GLUCOTROL)  10 MG tablet TAKE 1 TABLET (10 MG TOTAL) BY MOUTH 2 (TWO) TIMES DAILY BEFORE A MEAL. (Patient taking differently: TAKE 1 TABLET (10 MG TOTAL) BY MOUTH 1 (ONE) TIME DAILY BEFORE A MEAL.) 60 tablet 11  . losartan-hydrochlorothiazide (HYZAAR) 50-12.5 MG tablet TAKE ONE TABLET BY MOUTH DAILY 30 tablet 0  . meloxicam (MOBIC) 15 MG tablet Take 1 tablet (15 mg total) by mouth daily. 30 tablet 11  . metFORMIN (GLUCOPHAGE) 1000 MG tablet TAKE 1 TABLET (1,000 MG TOTAL) BY MOUTH 2 (TWO) TIMES DAILY WITH A MEAL. 60 tablet 6  . metoprolol tartrate (LOPRESSOR) 50 MG tablet Take 1 tablet (50 mg total) by mouth 2 (two) times daily. 60 tablet 11  . omeprazole (PRILOSEC) 40 MG capsule Take 1 capsule (40 mg total) by mouth 2 (two)  times daily. 60 capsule 1  . sertraline (ZOLOFT) 50 MG tablet TAKE ONE TABLET BY MOUTH DAILY 30 tablet 2   No current facility-administered medications for this visit.    Allergies  Allergen Reactions  . Lisinopril Cough     Review of Systems: All systems reviewed and negative except where noted in HPI.    No results found.  Physical Exam: BP 110/74 (BP Location: Left Arm, Patient Position: Sitting, Cuff Size: Normal)   Pulse 76   Ht 5' 4.5" (1.638 m) Comment: height measured without shoes  Wt 244 lb 6 oz (110.8 kg)   BMI 41.30 kg/m  Constitutional: Pleasant,well-developed, female in no acute distress. HEENT: Normocephalic and atraumatic. Conjunctivae are normal. No scleral icterus. Neck supple.  Cardiovascular: Normal rate, regular rhythm.  Pulmonary/chest: Effort normal and breath sounds normal. No wheezing, rales or rhonchi. Abdominal: Soft, nondistended, nontender.  There are no masses palpable. No hepatomegaly. Extremities: no edema Lymphadenopathy: No cervical adenopathy noted. Neurological: Alert and oriented to person place and time. Skin: Skin is warm and dry. No rashes noted. Psychiatric: Normal mood and affect. Behavior is normal.   ASSESSMENT AND PLAN: 52 year old female here for reassessment of the following issues:  GERD - symptoms of chronic cough and sense of globus/gagging in the past, has responded quite well to high-dose PPI. She's been on this since 2017 and I discussed long-term risks benefits of chronic PPI use with her. I recommend trying to use the lowest dose possible to control her symptoms. We will reduce her to 40 mg once daily at this time. If this continues works well for her we may further reduce this to 20 mg once daily. Likewise if she has worsening of the symptoms and is not controlled on 40 mg once daily, we can increase the dose back to twice daily. Given her duration of symptoms and age, I offered her an upper endoscopy at some point in  time to screen for Barrett's esophagus. Following discussion of EGD she wanted to proceed with it. She would prefer to have this done at the same time as her colonoscopy for which she is due for next year. She has no dysphagia or alarm symptoms, okay to wait until that time, and less she has anemia. Given what she was told by the blood bank that she had anemia, will check CBC as well as iron studies. If she has an iron deficiency anemia will perform her procedure now. She agreed with the plan.  Reported anemia - will check CBC and iron studies to clarify, no records on hand to show this  History of colon adenomas - due for repeat surveillance colonoscopy in August 2020 was  iron deficient, may perform it sooner.   Homer Cellar, MD [facility]

## 2017-11-20 LAB — IRON AND TIBC
Iron Saturation: 17 % (ref 15–55)
Iron: 61 ug/dL (ref 27–159)
Total Iron Binding Capacity: 354 ug/dL (ref 250–450)
UIBC: 293 ug/dL (ref 131–425)

## 2017-11-27 ENCOUNTER — Encounter: Payer: Self-pay | Admitting: Gastroenterology

## 2017-12-01 ENCOUNTER — Ambulatory Visit: Payer: Commercial Managed Care - PPO | Admitting: Family Medicine

## 2017-12-01 ENCOUNTER — Encounter: Payer: Self-pay | Admitting: Family Medicine

## 2017-12-01 VITALS — BP 140/90 | HR 78 | Temp 98.4°F | Ht 64.5 in | Wt 244.1 lb

## 2017-12-01 DIAGNOSIS — D1722 Benign lipomatous neoplasm of skin and subcutaneous tissue of left arm: Secondary | ICD-10-CM

## 2017-12-01 DIAGNOSIS — E782 Mixed hyperlipidemia: Secondary | ICD-10-CM

## 2017-12-01 DIAGNOSIS — K219 Gastro-esophageal reflux disease without esophagitis: Secondary | ICD-10-CM

## 2017-12-01 DIAGNOSIS — E119 Type 2 diabetes mellitus without complications: Secondary | ICD-10-CM | POA: Diagnosis not present

## 2017-12-01 NOTE — Progress Notes (Signed)
   Subjective:    Patient ID: Morgan [name], female    DOB: August 06, 1965, 52 y.o.   MRN: [id number]  HPI Here for several issues. First she asks to check an A1c today. She tries to watch her diet. Her home glucometer broke so she has not been able to check glucoses for some time. Also she wants to check her lipids again soon since she has been making an effort to get this under better control. She is not fasting today however. She recently noticed a lump on the left arm and asks me to look at it. There is no pain or limitation of movement. Lastly she asks me about her GERD treatment. She had been taking Omeprazole 40 mg bid with excellent control of her symptoms. She recently met with Dr. Havery Moros and he expressed his concern about possible long term effects of taking high dose PPI's. He asked her to decrease the Omeprazole to once a day, which she has done. However she is very concerned that her GERD symptoms will return.    Review of Systems  Constitutional: Negative.   Respiratory: Negative.   Cardiovascular: Negative.   Gastrointestinal: Negative.   Neurological: Negative.        Objective:   Physical Exam  Constitutional: She appears well-developed and well-nourished.  Cardiovascular: Normal rate, regular rhythm, normal heart sounds and intact distal pulses.  Pulmonary/Chest: Effort normal and breath sounds normal.  Abdominal: Soft. Bowel sounds are normal. She exhibits no distension and no mass. There is no tenderness. There is no rebound and no guarding. No hernia.  Musculoskeletal:  There is a 2 cm firm non-tender mobile mass just beneath the skin of the left upper arm          Assessment & Plan:  As for her diabetes, she will have an A1c drawn today. I wrote a rx for her to get a new glucometer at her pharmacy with testing supplies. She has a lipoma on the left arm, and I reassured her that this is benign and would likely never bother her. For her high cholesterol, she  will set up fasting labs soon to include a lipid panel. For the GERD I recommended her to take Omeprazole 40 mg every morning but she can add a 150 mg Zantac every evening. There are no reported side effects of long term problems taking an H2 blocker.  [name], MD

## 2017-12-02 ENCOUNTER — Telehealth: Payer: Self-pay | Admitting: Family Medicine

## 2017-12-02 ENCOUNTER — Other Ambulatory Visit: Payer: Self-pay

## 2017-12-02 ENCOUNTER — Encounter: Payer: Self-pay | Admitting: Family Medicine

## 2017-12-02 LAB — HEMOGLOBIN A1C: Hgb A1c MFr Bld: 7.7 % — ABNORMAL HIGH (ref 4.6–6.5)

## 2017-12-02 MED ORDER — ONETOUCH LANCETS MISC: 3 refills | Status: DC

## 2017-12-02 MED ORDER — OMEPRAZOLE 40 MG PO CPDR: 40.0000 mg | DELAYED_RELEASE_CAPSULE | Freq: Two times a day (BID) | ORAL | 3 refills | Status: DC

## 2017-12-02 MED ORDER — GLUCOSE BLOOD VI STRP: ORAL_STRIP | 3 refills | Status: DC

## 2017-12-02 NOTE — Telephone Encounter (Signed)
Strips and lancets sent to [facility]

## 2017-12-02 NOTE — Telephone Encounter (Signed)
Patient calling and states that insurance covers a [employer] Meter.

## 2017-12-02 NOTE — Telephone Encounter (Signed)
I spoke with pt, she will need to call either insurance company or pharmacy and find out exactly which machine is covered with her insurance. We need the name of machine and then can send in order, pt understands and will call us back.

## 2017-12-02 NOTE — Telephone Encounter (Signed)
Tell her that NO she does not need insulin. She can get this down by watching a stricter diet and exercising. Worst case we could add another pill for her to take, but hopefuly we will not need to

## 2017-12-02 NOTE — Telephone Encounter (Signed)
Copied from [place] [id number]. Topic: Quick Communication - Rx Refill/Question >> Dec 02, 2017  9:12 AM Nils Flack wrote: Medication: *glucose test meter, strip, lancets   Has the patient contacted their pharmacy? Yes.   (Agent: If no, request that the patient contact the pharmacy for the refill.) (Agent: If yes, when and what did the pharmacy advise?) Got a rx for meter strip, and lancets.  They need to know the brand name  Preferred Pharmacy (with phone number or street name):  [facility] [phone]  Agent: Please be advised that RX refills may take up to 3 business days. We ask that you follow-up with your pharmacy.

## 2017-12-03 NOTE — Telephone Encounter (Signed)
Patient is calling and states her insurance company covers [employer] meter. Please advise.   [facility] [place], [place] [address]  Phone: [phone] Fax: [phone]

## 2017-12-04 ENCOUNTER — Other Ambulatory Visit (INDEPENDENT_AMBULATORY_CARE_PROVIDER_SITE_OTHER): Payer: Commercial Managed Care - PPO

## 2017-12-04 ENCOUNTER — Other Ambulatory Visit: Payer: Self-pay | Admitting: Family Medicine

## 2017-12-04 DIAGNOSIS — E782 Mixed hyperlipidemia: Secondary | ICD-10-CM

## 2017-12-04 LAB — HEPATIC FUNCTION PANEL
ALT: 23 U/L (ref 0–35)
AST: 18 U/L (ref 0–37)
Albumin: 4 g/dL (ref 3.5–5.2)
Alkaline Phosphatase: 79 U/L (ref 39–117)
Bilirubin, Direct: 0.1 mg/dL (ref 0.0–0.3)
Total Bilirubin: 0.5 mg/dL (ref 0.2–1.2)
Total Protein: 6.6 g/dL (ref 6.0–8.3)

## 2017-12-04 LAB — LIPID PANEL
Cholesterol: 169 mg/dL (ref 0–200)
HDL: 32.7 mg/dL — ABNORMAL LOW (ref >39.00)
NonHDL: 136.07
Total CHOL/HDL Ratio: 5
Triglycerides: 297 mg/dL — ABNORMAL HIGH (ref 0.0–149.0)
VLDL: 59.4 mg/dL — ABNORMAL HIGH (ref 0.0–40.0)

## 2017-12-04 LAB — LDL CHOLESTEROL, DIRECT: Direct LDL: 91 mg/dL

## 2017-12-04 MED ORDER — ONETOUCH ULTRA MINI W/DEVICE KIT: PACK | 0 refills | Status: DC

## 2017-12-04 NOTE — Addendum Note (Signed)
Addended by: Dorrene German on: 12/04/2017 01:33 PM   Modules accepted: Orders

## 2017-12-04 NOTE — Telephone Encounter (Signed)
Sent to PCP to verify how you would like pt to take this medication    Ordered as 1 table bid    Pt is take once daily  How should we refill this Rx for the pt?

## 2017-12-04 NOTE — Telephone Encounter (Signed)
Kit filled to pharmacy as requested.

## 2017-12-07 ENCOUNTER — Encounter: Payer: Self-pay | Admitting: Family Medicine

## 2017-12-25 ENCOUNTER — Encounter: Payer: Self-pay | Admitting: Family Medicine

## 2017-12-27 ENCOUNTER — Other Ambulatory Visit: Payer: Self-pay | Admitting: Family Medicine

## 2017-12-28 ENCOUNTER — Encounter: Payer: Self-pay | Admitting: Family Medicine

## 2017-12-29 ENCOUNTER — Encounter: Payer: Self-pay | Admitting: Primary Care

## 2017-12-29 ENCOUNTER — Ambulatory Visit (INDEPENDENT_AMBULATORY_CARE_PROVIDER_SITE_OTHER): Payer: Commercial Managed Care - PPO | Admitting: Primary Care

## 2017-12-29 VITALS — BP 114/70 | HR 74 | Ht 64.0 in | Wt 242.0 lb

## 2017-12-29 DIAGNOSIS — Z9989 Dependence on other enabling machines and devices: Secondary | ICD-10-CM | POA: Diagnosis not present

## 2017-12-29 DIAGNOSIS — H9313 Tinnitus, bilateral: Secondary | ICD-10-CM

## 2017-12-29 DIAGNOSIS — G4733 Obstructive sleep apnea (adult) (pediatric): Secondary | ICD-10-CM | POA: Diagnosis not present

## 2017-12-29 DIAGNOSIS — J4520 Mild intermittent asthma, uncomplicated: Secondary | ICD-10-CM | POA: Diagnosis not present

## 2017-12-29 DIAGNOSIS — Z579 Occupational exposure to unspecified risk factor: Secondary | ICD-10-CM

## 2017-12-29 NOTE — Progress Notes (Signed)
@Patient  ID: Morgan [name], female    DOB: [birth year], 52 y.o.   MRN: [id number]  Chief Complaint  Patient presents with  . Follow-up    OSA follow up - doing great    Referring provider: Laurey Morale, MD  HPI:  52 year old female with hx OSA and occupational asthma previously followed by Dr. Ashok Cordia, last seen in Aug 2018. Completed split night sleep study in 11/2016 with an AHI of 112.1.  12/29/2017 Presents today for routine follow-up visit. Reports that she is doing great. Using CPAP every night. Reports that she can see a difference when using mask, states that she feels really tired if she doesn't not wear CPAP. Acute complaints of ringing in bilateral ears since she's been using CPAP. Symptoms are worse in the morning and dissipate in the early afternoon. Excellent compliance 30/30 days, average usage 7 hrs 2 mins. AHI 0.8. CPAP set pressure at 15cm H20. Asthma is well controlled, she has not required albuterol inhaler. Denies cough since starting CPAP.   Allergies  Allergen Reactions  . Lisinopril Cough    Immunization History  Administered Date(s) Administered  . Influenza Split 05/01/2011, 09/05/2013  . Influenza,inj,Quad PF,6+ Mos 05/27/2017  . Influenza-Unspecified 07/03/2016    Past Medical History:  Diagnosis Date  . Anal fissure    saw Dr. Mercy Riding   . Arthritis   . Diabetes mellitus   . GERD (gastroesophageal reflux disease)   . History of ovarian cancer in adulthood   . Hyperlipidemia   . Hypertension   . Ovarian cancer ([place])   . Skin cancer (melanoma) ([place])   . Tubular adenoma of colon 01/2014    Tobacco History: Social History   Tobacco Use  Smoking Status Never Smoker  Smokeless Tobacco Never Used   Counseling given: Not Answered       Review of Systems  Review of Systems  Constitutional: Negative.   HENT: Negative for ear pain.        Ringing in ears  Respiratory: Negative.   Cardiovascular: Negative.   Neurological:  Negative.  Negative for dizziness.     Physical Exam  BP 114/70 (BP Location: Left Arm, Cuff Size: Normal)   Pulse 74   Ht 5\' 4"  (1.626 m)   Wt 242 lb (109.8 kg)   SpO2 100%   BMI 41.54 kg/m  Physical Exam  Constitutional: She is oriented to person, place, and time. She appears well-developed and well-nourished.  HENT:  Head: Normocephalic and atraumatic.  Eyes: Pupils are equal, round, and reactive to light. EOM are normal.  Neck: Normal range of motion. Neck supple.  Cardiovascular: Normal rate, regular rhythm and normal heart sounds.  No murmur heard. Pulmonary/Chest: Effort normal and breath sounds normal. No respiratory distress. She has no wheezes.  Abdominal: Soft. There is no tenderness.  Neurological: She is alert and oriented to person, place, and time.  Skin: Skin is warm and dry. No rash noted. No erythema.  Psychiatric: She has a normal mood and affect. Her behavior is normal. Judgment normal.      Assessment & Plan:   Occupational asthma Improved symptoms with CPAP use. Not requiring prn albuterol inh  OSA on CPAP Continues with CPAP, excellent compliance. Download showing total usage at 30/30 days, 29 days >4hrs. CPAP pressure set at 15cm H2O. AHI 0.8. Feels well, reports no fatigue when using. Acute complaints of bilateral ear ringing in morning. Plan to decrease pressure to 12cm H2O and review download  in 1 month. If persists would refer to ENT or discuss oral appliance for OSA instead of CPAP machine.   Ringing in ears, bilateral New; started when CPAP was initiated. Symptoms are present in the morning and dissipate in the afternoon. PE normal, TM clear. Possibly related to eustachian tube dysfunction or high CPAP pressure. Plan decrease CPAP setting to 12cm H2O from 15cm H2O. Will contact DME to help set this up and will review download in 3-4 weeks. If persists, consider ENT referral.      Martyn Ehrich, NP 12/29/2017

## 2017-12-29 NOTE — Patient Instructions (Addendum)
Compliance with CPAP is excellent, your results look great  Goal is to wear every night for >4hrs Continue working on healthy diet and weight; focus on whole foods, decrease sugar and processed foods, increase activity (10 min walk a day) Do not drive if daytime sleepiness  Follow up in 1 year and as needed

## 2017-12-29 NOTE — Assessment & Plan Note (Addendum)
Continues with CPAP, excellent compliance. Download showing total usage at 30/30 days, 29 days >4hrs. CPAP pressure set at 15cm H2O. AHI 0.8. Feels well, reports no fatigue when using. Acute complaints of bilateral ear ringing in morning. Plan to decrease pressure to 12cm H2O and review download in 1 month. If persists would refer to ENT or discuss oral appliance for OSA instead of CPAP machine.

## 2017-12-29 NOTE — Assessment & Plan Note (Addendum)
New; started when CPAP was initiated. Symptoms are present in the morning and dissipate in the afternoon. PE normal, TM clear. Possibly related to eustachian tube dysfunction or high CPAP pressure. Plan decrease CPAP setting to 12cm H2O from 15cm H2O. Will contact DME to help set this up and will review download in 3-4 weeks. If persists, consider ENT referral.

## 2017-12-29 NOTE — Assessment & Plan Note (Signed)
Improved symptoms with CPAP use. Not requiring prn albuterol inh

## 2017-12-31 ENCOUNTER — Encounter: Payer: Self-pay | Admitting: Primary Care

## 2018-01-01 ENCOUNTER — Encounter: Payer: Self-pay | Admitting: Family Medicine

## 2018-01-01 ENCOUNTER — Ambulatory Visit (INDEPENDENT_AMBULATORY_CARE_PROVIDER_SITE_OTHER): Payer: Commercial Managed Care - PPO | Admitting: Family Medicine

## 2018-01-01 VITALS — BP 110/80 | HR 63 | Temp 97.8°F | Ht 64.0 in | Wt 241.6 lb

## 2018-01-01 DIAGNOSIS — E119 Type 2 diabetes mellitus without complications: Secondary | ICD-10-CM | POA: Diagnosis not present

## 2018-01-01 DIAGNOSIS — I10 Essential (primary) hypertension: Secondary | ICD-10-CM

## 2018-01-01 MED ORDER — PIOGLITAZONE HCL 30 MG PO TABS: 30.0000 mg | ORAL_TABLET | Freq: Every day | ORAL | 11 refills | Status: DC

## 2018-01-01 MED ORDER — METFORMIN HCL 1000 MG PO TABS: 500.0000 mg | ORAL_TABLET | Freq: Two times a day (BID) | ORAL | 6 refills | Status: DC

## 2018-01-01 NOTE — Telephone Encounter (Signed)
She was seen OV today  

## 2018-01-01 NOTE — Progress Notes (Signed)
   Subjective:    Patient ID: Morgan [name], female    DOB: 08-Apr-1966, 52 y.o.   MRN: [id number]  HPI Here for concerns about high glucoses. Her last A1c on 12-01-17 was 7.7. She has been taking Glipizide 10 mg bid and Metformin 500 mg bid. Her glucoses have been averaging in the low to mid 200s the past few weeks. She feels fine.   Review of Systems  Constitutional: Negative.   Respiratory: Negative.   Cardiovascular: Negative.   Neurological: Negative.        Objective:   Physical Exam  Constitutional: She is oriented to person, place, and time. She appears well-developed and well-nourished.  Cardiovascular: Normal rate, regular rhythm, normal heart sounds and intact distal pulses.  Pulmonary/Chest: Effort normal and breath sounds normal.  Neurological: She is alert and oriented to person, place, and time.          Assessment & Plan:  For the diabetes, she will add Actos 30 mg every morning. Recheck in one month. [name], MD

## 2018-01-01 NOTE — Telephone Encounter (Signed)
Patient checked in today for an appt.

## 2018-01-01 NOTE — Telephone Encounter (Signed)
She is on our schedule to be seen today, so I will talk to her then

## 2018-01-04 ENCOUNTER — Telehealth: Payer: Self-pay | Admitting: Primary Care

## 2018-01-04 NOTE — Telephone Encounter (Signed)
Please see 7/4 mychart message for further documentation.

## 2018-01-07 ENCOUNTER — Encounter: Payer: Self-pay | Admitting: Family Medicine

## 2018-01-07 NOTE — Telephone Encounter (Signed)
She may have some problems with attention (like ADHD) but I wonder if this could be an effect of the Lipitor. I have heard about possible effects on thinking or memory. Have her stop taking this for one month and see if things improve

## 2018-01-11 ENCOUNTER — Telehealth: Payer: Self-pay | Admitting: Primary Care

## 2018-01-11 ENCOUNTER — Encounter: Payer: Self-pay | Admitting: Primary Care

## 2018-01-11 NOTE — Telephone Encounter (Signed)
Noted.  I have responded back to the pt through her original MyChart message.

## 2018-01-11 NOTE — Telephone Encounter (Signed)
-----   Message from Chauncey, Generic sent at 01/11/2018 2:21 PM EDT -----    this is not a question but just to inform you, since we have changed my cpack air from 15 to 12, my ears do not buzz like it did before must be working .  thank you again so much.

## 2018-01-12 ENCOUNTER — Encounter: Payer: Self-pay | Admitting: Family Medicine

## 2018-01-12 NOTE — Telephone Encounter (Signed)
Tell her to stay off the Lipitor for the time being, but make an OV for [place] to discuss this situation

## 2018-01-27 ENCOUNTER — Other Ambulatory Visit: Payer: Self-pay | Admitting: Family Medicine

## 2018-01-27 ENCOUNTER — Ambulatory Visit: Payer: Self-pay | Admitting: Family Medicine

## 2018-01-27 ENCOUNTER — Encounter: Payer: Self-pay | Admitting: Gastroenterology

## 2018-02-04 NOTE — Telephone Encounter (Signed)
[name] please clarify- pt wants to raise the pressure from 12 back to 14 or 15cm. Did the cpap download show that the need to be lowered further?  Thanks!

## 2018-02-04 NOTE — Telephone Encounter (Signed)
[name] please advise on requested cpap pressure change.  Thanks!

## 2018-02-05 ENCOUNTER — Other Ambulatory Visit: Payer: Self-pay

## 2018-02-05 DIAGNOSIS — Z9989 Dependence on other enabling machines and devices: Secondary | ICD-10-CM

## 2018-02-05 DIAGNOSIS — G4733 Obstructive sleep apnea (adult) (pediatric): Secondary | ICD-10-CM

## 2018-02-06 ENCOUNTER — Other Ambulatory Visit: Payer: Self-pay | Admitting: Family Medicine

## 2018-02-11 ENCOUNTER — Encounter: Payer: Self-pay | Admitting: Family Medicine

## 2018-02-16 ENCOUNTER — Ambulatory Visit (INDEPENDENT_AMBULATORY_CARE_PROVIDER_SITE_OTHER): Payer: Commercial Managed Care - PPO | Admitting: Family Medicine

## 2018-02-16 ENCOUNTER — Encounter: Payer: Self-pay | Admitting: Family Medicine

## 2018-02-16 VITALS — BP 120/84 | HR 97 | Temp 98.1°F | Ht 64.0 in | Wt 248.2 lb

## 2018-02-16 DIAGNOSIS — E119 Type 2 diabetes mellitus without complications: Secondary | ICD-10-CM | POA: Diagnosis not present

## 2018-02-16 DIAGNOSIS — I10 Essential (primary) hypertension: Secondary | ICD-10-CM | POA: Diagnosis not present

## 2018-02-16 MED ORDER — PIOGLITAZONE HCL 15 MG PO TABS: 15.0000 mg | ORAL_TABLET | Freq: Every day | ORAL | 11 refills | Status: DC

## 2018-02-16 NOTE — Progress Notes (Signed)
   Subjective:    Patient ID: Morgan [name], female    DOB: 03-20-1966, 52 y.o.   MRN: [id number]  HPI Here to follow up on diabetes. At our last visit we added Pioglitazone 30 mg daily to her Metformin and Glipizide. Her glucoses responded nicely, going from the mid to high 200s down to a range of 120-140. However she had several rapid drops in glucose which made her feel shaky and nauseated. The lowest reading she has had was 87, and that day she had to take some glucose pills to get it back up. She watches her diet closely, and she snacks at least every 2-3 hours throughout the day.   Review of Systems  Constitutional: Negative.   Respiratory: Negative.   Cardiovascular: Negative.   Neurological: Negative.        Objective:   Physical Exam  Constitutional: She is oriented to person, place, and time. She appears well-developed and well-nourished.  Cardiovascular: Normal rate, regular rhythm, normal heart sounds and intact distal pulses.  Pulmonary/Chest: Effort normal and breath sounds normal.  Neurological: She is alert and oriented to person, place, and time.          Assessment & Plan:  Type 2 diabetes. We will decrease the dose of Pioglitazone to 15 mg daily. Recheck an A1c in a few weeks.  Morgan Penna, MD

## 2018-03-06 ENCOUNTER — Other Ambulatory Visit: Payer: Self-pay | Admitting: Family Medicine

## 2018-03-08 ENCOUNTER — Other Ambulatory Visit: Payer: Self-pay | Admitting: Family Medicine

## 2018-03-10 DIAGNOSIS — E119 Type 2 diabetes mellitus without complications: Secondary | ICD-10-CM | POA: Diagnosis not present

## 2018-03-12 ENCOUNTER — Encounter: Payer: Self-pay | Admitting: Family Medicine

## 2018-03-12 NOTE — Telephone Encounter (Signed)
She needs to tell me how she feels now that she is off the medication. Does she feel better or not?

## 2018-03-17 NOTE — Telephone Encounter (Signed)
hello   I am feeling ok . No more confusion like I was.  I will be in his office in October to re take the cholesterol blood work.  other then that I am feeling ok without taking it. I don't know what my cholesterol level number is, but I am good. ( smile)   Dr. Sarajane Jews please advise. Thanks

## 2018-03-18 DIAGNOSIS — G4733 Obstructive sleep apnea (adult) (pediatric): Secondary | ICD-10-CM | POA: Diagnosis not present

## 2018-03-19 NOTE — Telephone Encounter (Signed)
Stay off Lipitor until the next OV

## 2018-03-23 ENCOUNTER — Telehealth: Payer: Self-pay

## 2018-03-23 NOTE — Telephone Encounter (Signed)
To: [facility] CLINICAL POOL    From: Morgan [name]    Created: 03/18/2018 2:27 PM     *-*-*This message has not been handled.*-*-*  oh yes, I use the CPAP every single night. I usually get 7 hours on it , when I wake up it saids about 7 or 8 hours. I just now order a new tubing that connects from the back of the machine to the mask , thinking maybe its not flowing good, so I should have that new tubing in 7 days.  I don't think the mask is leaking its a new mask, I just put on last week. and it does not make a whistle sound so I think I have that on pretty good.Marland KitchenMarland KitchenMarland Kitchen. what do you think?remember, they did up the air , so what do you think . should I just wait until I get that new tube and see how that goes?     [name], please advise. Thanks!

## 2018-03-23 NOTE — Telephone Encounter (Signed)
Spoke with pt about tiredness.  Geraldo Pitter, NP, reviewed the CPAP compliance report and it was good, low AHI, low leaks, usage good.  Informed pt to either come in for follow up with doctor or PCP.  Pt wanted to wait and use new hose but will call if she wants to come in.  Nothing further needed.

## 2018-03-26 ENCOUNTER — Encounter: Payer: Self-pay | Admitting: Gastroenterology

## 2018-03-31 ENCOUNTER — Encounter: Payer: Self-pay | Admitting: Family Medicine

## 2018-03-31 NOTE — Telephone Encounter (Signed)
Just the labs please

## 2018-03-31 NOTE — Telephone Encounter (Signed)
Dr. Sarajane Jews please advise of the labs that you would like the pt to have done for her 3 month follow up.  Do you want the A1C and the cholesterol checked?  thanks

## 2018-04-01 ENCOUNTER — Other Ambulatory Visit (INDEPENDENT_AMBULATORY_CARE_PROVIDER_SITE_OTHER): Payer: Commercial Managed Care - PPO

## 2018-04-01 DIAGNOSIS — E782 Mixed hyperlipidemia: Secondary | ICD-10-CM | POA: Diagnosis not present

## 2018-04-01 DIAGNOSIS — E119 Type 2 diabetes mellitus without complications: Secondary | ICD-10-CM | POA: Diagnosis not present

## 2018-04-02 ENCOUNTER — Other Ambulatory Visit: Payer: Self-pay | Admitting: *Deleted

## 2018-04-02 DIAGNOSIS — E119 Type 2 diabetes mellitus without complications: Secondary | ICD-10-CM

## 2018-04-02 DIAGNOSIS — E782 Mixed hyperlipidemia: Secondary | ICD-10-CM

## 2018-04-02 LAB — LIPID PANEL
Cholesterol: 230 mg/dL — ABNORMAL HIGH (ref 0–200)
HDL: 40.2 mg/dL (ref >39.00)
NonHDL: 190.11
Total CHOL/HDL Ratio: 6
Triglycerides: 383 mg/dL — ABNORMAL HIGH (ref 0.0–149.0)
VLDL: 76.6 mg/dL — ABNORMAL HIGH (ref 0.0–40.0)

## 2018-04-02 LAB — HEMOGLOBIN A1C: Hgb A1c MFr Bld: 7.5 % — ABNORMAL HIGH (ref 4.6–6.5)

## 2018-04-02 LAB — LDL CHOLESTEROL, DIRECT: Direct LDL: 125 mg/dL

## 2018-04-06 ENCOUNTER — Encounter: Payer: Self-pay | Admitting: Family Medicine

## 2018-04-07 ENCOUNTER — Other Ambulatory Visit: Payer: Self-pay | Admitting: Family Medicine

## 2018-04-07 ENCOUNTER — Encounter: Payer: Self-pay | Admitting: Family Medicine

## 2018-04-07 MED ORDER — PIOGLITAZONE HCL 45 MG PO TABS: 45.0000 mg | ORAL_TABLET | Freq: Every day | ORAL | 3 refills | Status: DC

## 2018-04-07 MED ORDER — ATORVASTATIN CALCIUM 80 MG PO TABS: 80.0000 mg | ORAL_TABLET | Freq: Every day | ORAL | 3 refills | Status: DC

## 2018-04-20 ENCOUNTER — Ambulatory Visit (INDEPENDENT_AMBULATORY_CARE_PROVIDER_SITE_OTHER): Payer: Commercial Managed Care - PPO | Admitting: *Deleted

## 2018-04-20 DIAGNOSIS — Z23 Encounter for immunization: Secondary | ICD-10-CM | POA: Diagnosis not present

## 2018-04-26 ENCOUNTER — Other Ambulatory Visit: Payer: Self-pay | Admitting: Gastroenterology

## 2018-05-03 DIAGNOSIS — G4733 Obstructive sleep apnea (adult) (pediatric): Secondary | ICD-10-CM | POA: Diagnosis not present

## 2018-05-08 ENCOUNTER — Other Ambulatory Visit: Payer: Self-pay | Admitting: Family Medicine

## 2018-05-10 ENCOUNTER — Encounter: Payer: Self-pay | Admitting: Gastroenterology

## 2018-05-14 ENCOUNTER — Encounter: Payer: Self-pay | Admitting: Gastroenterology

## 2018-05-17 ENCOUNTER — Other Ambulatory Visit: Payer: Self-pay | Admitting: Family Medicine

## 2018-05-18 ENCOUNTER — Telehealth: Payer: Self-pay | Admitting: Family Medicine

## 2018-05-18 NOTE — Telephone Encounter (Unsigned)
Copied from [place] [id number]. Topic: Quick Communication - Rx Refill/Question >> May 18, 2018  3:27 PM Mcneil, Ja-Kwan wrote: Medication: metFORMIN (GLUCOPHAGE) 1000 MG tablet  Has the patient contacted their pharmacy? yes   Preferred Pharmacy (with phone number or street name): [facility] [address] [place], [place] - [address] [phone] (Phone) [phone] (Fax)  Agent: Please be advised that RX refills may take up to 3 business days. We ask that you follow-up with your pharmacy.

## 2018-05-19 ENCOUNTER — Encounter: Payer: Self-pay | Admitting: Family Medicine

## 2018-05-19 MED ORDER — METFORMIN HCL 1000 MG PO TABS: 500.0000 mg | ORAL_TABLET | Freq: Two times a day (BID) | ORAL | 6 refills | Status: DC

## 2018-05-20 ENCOUNTER — Encounter: Payer: Self-pay | Admitting: Gastroenterology

## 2018-05-20 ENCOUNTER — Other Ambulatory Visit: Payer: Self-pay | Admitting: *Deleted

## 2018-05-20 ENCOUNTER — Ambulatory Visit (INDEPENDENT_AMBULATORY_CARE_PROVIDER_SITE_OTHER): Payer: Commercial Managed Care - PPO | Admitting: Gastroenterology

## 2018-05-20 VITALS — BP 122/78 | HR 76 | Ht 64.5 in | Wt 254.1 lb

## 2018-05-20 DIAGNOSIS — K6289 Other specified diseases of anus and rectum: Secondary | ICD-10-CM

## 2018-05-20 DIAGNOSIS — K625 Hemorrhage of anus and rectum: Secondary | ICD-10-CM

## 2018-05-20 DIAGNOSIS — R140 Abdominal distension (gaseous): Secondary | ICD-10-CM | POA: Diagnosis not present

## 2018-05-20 MED ORDER — METFORMIN HCL 1000 MG PO TABS: 500.0000 mg | ORAL_TABLET | Freq: Two times a day (BID) | ORAL | 6 refills | Status: DC

## 2018-05-20 MED ORDER — AMBULATORY NON FORMULARY MEDICATION: 0 refills | Status: DC

## 2018-05-20 NOTE — Patient Instructions (Addendum)
If you are age 54 or older, your body mass index should be between 23-30. Your Body mass index is 42.95 kg/m. If this is out of the aforementioned range listed, please consider follow up with your Primary Care Provider.  If you are age 35 or younger, your body mass index should be between 19-25. Your Body mass index is 42.95 kg/m. If this is out of the aformentioned range listed, please consider follow up with your Primary Care Provider.   Discontinue fiber.  Use Miralax daily.  We have sent a prescription for nitroglycerin Diltiazem gel to [facility]. You should apply a pea size amount to your rectum three times daily 3 weeks as needed.  [facility]'s information is below: Address: [address] [place], [place] [address]  [phone]  *Please DO NOT go directly from our office to pick up this medication! Give the pharmacy 1 day to process the prescription as this is compounded at takes time to make.  We are giving you a Low FOD-MAP diet to follow.  If you symptoms do not improve within a few weeks please call back to discuss a colonoscopy procedure.   Thank you for entrusting me with your care and for choosing [facility], Dr. [place] Cellar

## 2018-05-20 NOTE — Progress Notes (Signed)
HPI :  52 year old female here for a follow-up visit. She has a history of reflux and anal fissures and internal hemorrhoids for which she's been seen in the past.  She reports this past weekend she felt constipated and used magnesium citrate to help produce a bowel movement. She had a very large bowel movement associated with pain and bleeding. For the past few days she's been having rectal pain with a small amount of blood with each bowel movement. She had been doing okay with MiraLAX over time prior to this several months ago, eventually stopped it. She had been treated for a fissure earlier this year using diltiazem and lidocaine ointment which worked quite well for her. She reports symptoms are similar as previous. She otherwise endorses some bloating and gas which bothers her. She had been using Benefiber recently. She began having a few bowel movements per day. She is trying not to strain. She endorses upper abdominal bloating after eating particular foods which can sometimes bother her.   Colonoscopy 02/17/2014 - diverticulosis, internal hemorrhoids, 1 small adenoma  Past Medical History:  Diagnosis Date  . Anal fissure    saw Dr. Mercy Riding   . Arthritis   . Diabetes mellitus   . GERD (gastroesophageal reflux disease)   . History of ovarian cancer in adulthood   . Hyperlipidemia   . Hypertension   . Ovarian cancer ([place])   . Skin cancer (melanoma) ([employer])   . Tubular adenoma of colon 01/2014     Past Surgical History:  Procedure Laterality Date  . ABDOMINAL HYSTERECTOMY  1986   BSO  . BASAL CELL CARCINOMA EXCISION  10-09-12   lower lip per Dr. Link Snuffer   . COLONOSCOPY  02-17-14   per Dr. Olevia Perches, adenomatous polyps, repeat in 5 yrs   . GLBS tumor ovary 1986    . HEMORRHOID SURGERY    . HIP FRACTURE SURGERY    . MELANOMA EXCISION  1997   upper chest    Family History  Problem Relation Age of Onset  . Ovarian cancer Sister   . Asthma Mother   . Arthritis Unknown     . Diabetes Unknown   . Prostate cancer Unknown   . Breast cancer Unknown   . Coronary artery disease Unknown   . Colon cancer Paternal Grandfather   . Pancreatic cancer Neg Hx   . Stomach cancer Neg Hx    Social History   Tobacco Use  . Smoking status: Never Smoker  . Smokeless tobacco: Never Used  Substance Use Topics  . Alcohol use: No    Alcohol/week: 0.0 standard drinks  . Drug use: No   Current Outpatient Medications  Medication Sig Dispense Refill  . atorvastatin (LIPITOR) 80 MG tablet Take 1 tablet (80 mg total) by mouth daily. 90 tablet 3  . Blood Glucose Monitoring Suppl (ONE TOUCH ULTRA MINI) w/Device KIT USE AS DIRECTED TO CHECK BLOOD SUGAR 1-2 TIMES DAILY. 1 each 0  . dicyclomine (BENTYL) 20 MG tablet Take 1 tablet (20 mg total) by mouth every 8 (eight) hours as needed (bowel cramps). 90 tablet 5  . glipiZIDE (GLUCOTROL) 10 MG tablet TAKE ONE TABLET BY MOUTH TWICE A DAY BEFORE A MEAL 60 tablet 11  . glucose blood test strip Check blood sugar 1-2 daily OneTouch Ultra Mini System Meter Dx code E11.9 200 each 3  . losartan-hydrochlorothiazide (HYZAAR) 50-12.5 MG tablet TAKE ONE TABLET BY MOUTH DAILY 30 tablet 11  . meloxicam (MOBIC) 15 MG  tablet Take 1 tablet (15 mg total) by mouth daily. 30 tablet 11  . metFORMIN (GLUCOPHAGE) 1000 MG tablet Take 0.5 tablets (500 mg total) by mouth 2 (two) times daily with a meal. 60 tablet 6  . metoprolol tartrate (LOPRESSOR) 50 MG tablet TAKE ONE TABLET BY MOUTH TWICE A DAY 60 tablet 7  . omeprazole (PRILOSEC) 40 MG capsule TAKE ONE CAPSULE BY MOUTH TWICE A DAY 60 capsule 5  . ONE TOUCH LANCETS MISC Check blood sugar 1-2 daily for OneTouch Ultra Mini system Dx code: E11.9 200 each 3  . pioglitazone (ACTOS) 45 MG tablet Take 1 tablet (45 mg total) by mouth daily. 90 tablet 3  . sertraline (ZOLOFT) 50 MG tablet TAKE ONE TABLET BY MOUTH DAILY 30 tablet 1   No current facility-administered medications for this visit.    Allergies   Allergen Reactions  . Lisinopril Cough     Review of Systems: All systems reviewed and negative except where noted in HPI.   Lab Results  Component Value Date   WBC 10.7 (H) 11/19/2017   HGB 12.5 11/19/2017   HCT 37.2 11/19/2017   MCV 84.8 11/19/2017   PLT 369.0 11/19/2017    Lab Results  Component Value Date   CREATININE 0.77 10/22/2016   BUN 19 10/22/2016   NA 142 10/22/2016   K 3.7 10/22/2016   CL 104 10/22/2016   CO2 30 10/22/2016    Lab Results  Component Value Date   ALT 23 12/04/2017   AST 18 12/04/2017   ALKPHOS 79 12/04/2017   BILITOT 0.5 12/04/2017     Physical Exam: BP 122/78 (BP Location: Left Arm, Patient Position: Sitting, Cuff Size: Normal)   Pulse 76   Ht 5' 4.5" (1.638 m)   Wt 254 lb 2 oz (115.3 kg)   BMI 42.95 kg/m  Constitutional: Pleasant,well-developed, female in no acute distress. HEENT: Normocephalic and atraumatic. Conjunctivae are normal. No scleral icterus. Neck supple.  Cardiovascular: Normal rate, regular rhythm.  Pulmonary/chest: Effort normal and breath sounds normal. No wheezing, rales or rhonchi. Abdominal: Soft, nondistended, nontender. . There are no masses palpable. No hepatomegaly. DRE - CMA Jackolyn Confer as standby - external hemorrhoids, no anal fissure, no obvious abscess. Pain with DRE exam, could not tolerate anoscopy - no obvious mass lesions Extremities: no edema Lymphadenopathy: No cervical adenopathy noted. Neurological: Alert and oriented to person place and time. Skin: Skin is warm and dry. No rashes noted. Psychiatric: Normal mood and affect. Behavior is normal.   ASSESSMENT AND PLAN: 52 year old female here for reassessment of the following issues:  Rectal pain / bleeding - acute onset after passing hard stool. She's had a history of anal fissures and internal hemorrhoids. On exam today it hard to appreciate obvious fissure although highly suspicious for it given her exam findings, suspect she may have a  more proximal fissure than I can see, she could not tolerate anoscopy and due to pain. I do not see any evidence of an abscess/ fistula, etc. No thrombosed external hemorrhoids. Recommend diltiazem / lidocaine ointment applied TID. Recommend she go back to using Miralax daily to keep stools soft and avoid straining. She is due for a colonoscopy next August. If she does not have improvement and symptoms persist in the upcoming days I asked her to contact me we may perform her colonoscopy sooner. She agreed.  Bloating - likely multifactorial due to constipation, fiber use, and potentially diet. Recommend low FODMAP diet, handouts given to her and she  was counseled on this. She should stop the fiber as well and stick with the Miralax, and see how she does on that regimen.   [place], MD [facility]

## 2018-05-31 ENCOUNTER — Encounter: Payer: Self-pay | Admitting: Family Medicine

## 2018-05-31 MED ORDER — SERTRALINE HCL 50 MG PO TABS: 50.0000 mg | ORAL_TABLET | Freq: Every day | ORAL | 5 refills | Status: DC

## 2018-05-31 NOTE — Telephone Encounter (Signed)
Pt has 2 pills left.

## 2018-07-02 ENCOUNTER — Encounter: Payer: Self-pay | Admitting: Family Medicine

## 2018-07-02 ENCOUNTER — Other Ambulatory Visit: Payer: Self-pay | Admitting: Family Medicine

## 2018-07-02 DIAGNOSIS — E782 Mixed hyperlipidemia: Secondary | ICD-10-CM

## 2018-07-02 DIAGNOSIS — E119 Type 2 diabetes mellitus without complications: Secondary | ICD-10-CM

## 2018-07-04 DIAGNOSIS — J3089 Other allergic rhinitis: Secondary | ICD-10-CM | POA: Diagnosis not present

## 2018-07-07 ENCOUNTER — Other Ambulatory Visit (INDEPENDENT_AMBULATORY_CARE_PROVIDER_SITE_OTHER): Payer: Commercial Managed Care - PPO

## 2018-07-07 DIAGNOSIS — E119 Type 2 diabetes mellitus without complications: Secondary | ICD-10-CM | POA: Diagnosis not present

## 2018-07-07 DIAGNOSIS — E782 Mixed hyperlipidemia: Secondary | ICD-10-CM

## 2018-07-07 LAB — HEMOGLOBIN A1C: Hgb A1c MFr Bld: 7.7 % — ABNORMAL HIGH (ref 4.6–6.5)

## 2018-07-07 LAB — LIPID PANEL
Cholesterol: 179 mg/dL (ref 0–200)
HDL: 41.2 mg/dL (ref >39.00)
NonHDL: 138.29
Total CHOL/HDL Ratio: 4
Triglycerides: 228 mg/dL — ABNORMAL HIGH (ref 0.0–149.0)
VLDL: 45.6 mg/dL — ABNORMAL HIGH (ref 0.0–40.0)

## 2018-07-07 LAB — HEPATIC FUNCTION PANEL
ALT: 28 U/L (ref 0–35)
AST: 24 U/L (ref 0–37)
Albumin: 4 g/dL (ref 3.5–5.2)
Alkaline Phosphatase: 79 U/L (ref 39–117)
Bilirubin, Direct: 0.1 mg/dL (ref 0.0–0.3)
Total Bilirubin: 0.7 mg/dL (ref 0.2–1.2)
Total Protein: 6.8 g/dL (ref 6.0–8.3)

## 2018-07-07 LAB — LDL CHOLESTEROL, DIRECT: Direct LDL: 107 mg/dL

## 2018-07-09 ENCOUNTER — Encounter: Payer: Self-pay | Admitting: *Deleted

## 2018-08-25 DIAGNOSIS — D225 Melanocytic nevi of trunk: Secondary | ICD-10-CM | POA: Diagnosis not present

## 2018-08-25 DIAGNOSIS — Z85820 Personal history of malignant melanoma of skin: Secondary | ICD-10-CM | POA: Diagnosis not present

## 2018-08-25 DIAGNOSIS — Z85828 Personal history of other malignant neoplasm of skin: Secondary | ICD-10-CM | POA: Diagnosis not present

## 2018-09-08 DIAGNOSIS — R609 Edema, unspecified: Secondary | ICD-10-CM | POA: Diagnosis not present

## 2018-09-20 ENCOUNTER — Other Ambulatory Visit: Payer: Self-pay | Admitting: Family Medicine

## 2018-09-22 ENCOUNTER — Encounter: Payer: Self-pay | Admitting: Family Medicine

## 2018-10-12 ENCOUNTER — Encounter: Payer: Self-pay | Admitting: Family Medicine

## 2018-10-12 ENCOUNTER — Telehealth: Payer: Self-pay | Admitting: *Deleted

## 2018-10-12 NOTE — Telephone Encounter (Signed)
Copied from [place] [phone]. Topic: General - Other >> Oct 12, 2018  1:41 PM Celene Kras A wrote: Reason for CRM: Pt called to return a call from [place] about setting up a virtual visit. Please advise  Called and spoke with pt and she is aware of appt scheduled with Dr. Sarajane Jews on 4/15

## 2018-10-13 ENCOUNTER — Ambulatory Visit (INDEPENDENT_AMBULATORY_CARE_PROVIDER_SITE_OTHER): Payer: Commercial Managed Care - PPO | Admitting: Family Medicine

## 2018-10-13 ENCOUNTER — Encounter: Payer: Self-pay | Admitting: Family Medicine

## 2018-10-13 ENCOUNTER — Other Ambulatory Visit: Payer: Self-pay

## 2018-10-13 DIAGNOSIS — F419 Anxiety disorder, unspecified: Secondary | ICD-10-CM | POA: Insufficient documentation

## 2018-10-13 DIAGNOSIS — E119 Type 2 diabetes mellitus without complications: Secondary | ICD-10-CM

## 2018-10-13 DIAGNOSIS — F411 Generalized anxiety disorder: Secondary | ICD-10-CM

## 2018-10-13 NOTE — Progress Notes (Signed)
   Subjective:    Patient ID: Morgan [name], female    DOB: [birth year], 53 y.o.   MRN: [id number]  HPI Virtual Visit via Telephone Note  I connected with the patient on 10/13/18 at  8:30 AM EDT by telephone and verified that I am speaking with the correct person using two identifiers. We attempted to speak via [email or web address] but we had technical difficulties with the audio, so we spoke by phone.    I discussed the limitations, risks, security and privacy concerns of performing an evaluation and management service by telephone and the availability of in person appointments. I also discussed with the patient that there may be a patient responsible charge related to this service. The patient expressed understanding and agreed to proceed.  Location patient: home Location provider: work or home office Participants present for the call: patient, provider Patient did not have a visit in the prior 7 days to address this/these issue(s).   History of Present Illness: She had been doing very well with her anxiety by taking Zoloft 50 mg every night at bedtime. However om the past month her anxiety levels have been rising again. She worries about things and she finds herself being more irritable on her job. Sleep and appetite are preserved.    Observations/Objective: Patient sounds cheerful and well on the phone. I do not appreciate any SOB. Speech and thought processing are grossly intact. Patient reported vitals:  Assessment and Plan: For her anxiety she will increase the Zoloft to 100 mg daily. Recheck in 2-3 weeks.  [name], MD    Follow Up Instructions:     [phone] 5-10 [phone] 11-20 9443 21-30 I did not refer this patient for an OV in the next 24 hours for this/these issue(s).  I discussed the assessment and treatment plan with the patient. The patient was provided an opportunity to ask questions and all were answered. The patient agreed with the plan and demonstrated an  understanding of the instructions.   The patient was advised to call back or seek an in-person evaluation if the symptoms worsen or if the condition fails to improve as anticipated.  I provided 13 minutes of non-face-to-face time during this encounter.   [name], MD    Review of Systems     Objective:   Physical Exam        Assessment & Plan:

## 2018-10-16 ENCOUNTER — Other Ambulatory Visit: Payer: Self-pay | Admitting: Gastroenterology

## 2018-10-23 ENCOUNTER — Other Ambulatory Visit: Payer: Self-pay | Admitting: Gastroenterology

## 2018-12-03 ENCOUNTER — Telehealth: Payer: Self-pay | Admitting: Primary Care

## 2018-12-03 DIAGNOSIS — G4733 Obstructive sleep apnea (adult) (pediatric): Secondary | ICD-10-CM

## 2018-12-03 NOTE — Telephone Encounter (Signed)
DME order placed for supplies. Called to inform pt/ left message. Nothing further needed.

## 2018-12-21 ENCOUNTER — Other Ambulatory Visit: Payer: Self-pay | Admitting: Family Medicine

## 2018-12-23 ENCOUNTER — Other Ambulatory Visit: Payer: Self-pay | Admitting: Gastroenterology

## 2019-01-03 ENCOUNTER — Other Ambulatory Visit: Payer: Self-pay | Admitting: Family Medicine

## 2019-01-17 ENCOUNTER — Encounter: Payer: Self-pay | Admitting: Family Medicine

## 2019-01-17 DIAGNOSIS — E119 Type 2 diabetes mellitus without complications: Secondary | ICD-10-CM

## 2019-01-17 DIAGNOSIS — E782 Mixed hyperlipidemia: Secondary | ICD-10-CM

## 2019-01-17 NOTE — Telephone Encounter (Signed)
Ok to place orders

## 2019-01-18 NOTE — Telephone Encounter (Signed)
The orders are in, so she can make a lab appt

## 2019-01-20 ENCOUNTER — Other Ambulatory Visit: Payer: Self-pay | Admitting: Family Medicine

## 2019-01-24 ENCOUNTER — Encounter: Payer: Self-pay | Admitting: Gastroenterology

## 2019-01-25 ENCOUNTER — Other Ambulatory Visit (INDEPENDENT_AMBULATORY_CARE_PROVIDER_SITE_OTHER): Payer: Commercial Managed Care - PPO

## 2019-01-25 ENCOUNTER — Other Ambulatory Visit: Payer: Self-pay

## 2019-01-25 DIAGNOSIS — E782 Mixed hyperlipidemia: Secondary | ICD-10-CM | POA: Diagnosis not present

## 2019-01-25 DIAGNOSIS — E119 Type 2 diabetes mellitus without complications: Secondary | ICD-10-CM

## 2019-01-25 LAB — HEPATIC FUNCTION PANEL
ALT: 27 U/L (ref 0–35)
AST: 21 U/L (ref 0–37)
Albumin: 4.2 g/dL (ref 3.5–5.2)
Alkaline Phosphatase: 82 U/L (ref 39–117)
Bilirubin, Direct: 0.1 mg/dL (ref 0.0–0.3)
Total Bilirubin: 0.6 mg/dL (ref 0.2–1.2)
Total Protein: 6.9 g/dL (ref 6.0–8.3)

## 2019-01-25 LAB — LIPID PANEL
Cholesterol: 165 mg/dL (ref 0–200)
HDL: 37.5 mg/dL — ABNORMAL LOW (ref >39.00)
NonHDL: 127.82
Total CHOL/HDL Ratio: 4
Triglycerides: 254 mg/dL — ABNORMAL HIGH (ref 0.0–149.0)
VLDL: 50.8 mg/dL — ABNORMAL HIGH (ref 0.0–40.0)

## 2019-01-25 LAB — HEMOGLOBIN A1C: Hgb A1c MFr Bld: 8.3 % — ABNORMAL HIGH (ref 4.6–6.5)

## 2019-01-25 LAB — LDL CHOLESTEROL, DIRECT: Direct LDL: 90 mg/dL

## 2019-01-29 ENCOUNTER — Encounter: Payer: Self-pay | Admitting: Family Medicine

## 2019-01-29 NOTE — Addendum Note (Signed)
Addended by: [name] A on: 01/29/2019 12:22 PM   Modules accepted: Orders

## 2019-01-31 ENCOUNTER — Encounter: Payer: Self-pay | Admitting: Family Medicine

## 2019-02-01 NOTE — Telephone Encounter (Signed)
Set up a Doxy visit so we can talk about this

## 2019-02-04 ENCOUNTER — Encounter: Payer: Self-pay | Admitting: Gastroenterology

## 2019-02-04 ENCOUNTER — Encounter: Payer: Self-pay | Admitting: Primary Care

## 2019-02-04 ENCOUNTER — Other Ambulatory Visit: Payer: Self-pay

## 2019-02-04 ENCOUNTER — Ambulatory Visit (INDEPENDENT_AMBULATORY_CARE_PROVIDER_SITE_OTHER): Payer: Commercial Managed Care - PPO | Admitting: Primary Care

## 2019-02-04 VITALS — BP 116/78 | HR 70 | Temp 98.0°F | Ht 64.0 in | Wt 271.2 lb

## 2019-02-04 DIAGNOSIS — G4733 Obstructive sleep apnea (adult) (pediatric): Secondary | ICD-10-CM | POA: Diagnosis not present

## 2019-02-04 DIAGNOSIS — Z9989 Dependence on other enabling machines and devices: Secondary | ICD-10-CM | POA: Diagnosis not present

## 2019-02-04 DIAGNOSIS — Z6841 Body Mass Index (BMI) 40.0 and over, adult: Secondary | ICD-10-CM | POA: Diagnosis not present

## 2019-02-04 MED ORDER — ALBUTEROL SULFATE HFA 108 (90 BASE) MCG/ACT IN AERS: 2.0000 | INHALATION_SPRAY | Freq: Four times a day (QID) | RESPIRATORY_TRACT | 2 refills | Status: DC | PRN

## 2019-02-04 NOTE — Assessment & Plan Note (Signed)
-   100% compliant with CPAP use - Pressure 14cm; AHI 0.8 - Patient would like to increase pressure to 15cm H20  - FU in 8-12 months

## 2019-02-04 NOTE — Progress Notes (Signed)
_0  ID: Morgan [name], female    DOB: Feb 19, 1966, 53 y.o.   MRN: [id number]  Chief Complaint  Patient presents with  . Follow-up    update on CPAP - thinks may need pressure adjustment     Referring provider: Laurey Morale, MD  HPI: 53 year old female with hx OSA and occupational asthma previously followed by Dr. Ashok Cordia, last seen in Aug 2018. Completed split night sleep study in 11/2016 with an AHI of 112.1.  Previous [facility] encounter: 12/29/2017 Presents today for routine follow-up visit. Reports that she is doing great. Using CPAP every night. Reports that she can see a difference when using mask, states that she feels really tired if she doesn't not wear CPAP. Acute complaints of ringing in bilateral ears since she's been using CPAP. Symptoms are worse in the morning and dissipate in the early afternoon. Excellent compliance 30/30 days, average usage 7 hrs 2 mins. AHI 0.8. CPAP set pressure at 15cm H20. Asthma is well controlled, she has not required albuterol inhaler. Denies cough since starting CPAP.   02/04/2019  Patient presents today for annual CPAP follow-up. She is doing well, no acute complaints. Feels pressure may not be strong enough. She has moderate air leaks which hasn't affected her AHI score. She tried a new mask with her DME company but she did not like it. Ringing in her ears resolved when lowering her pressure. Is is compliant with use and reports benefit from wearing. She is frustrated with weight gain and increasing diabetes numbers. She tried increasing her physical activity and was walking 3-5 times a week. She is being referred to endocrinology.   Airview download: 30/30 days; 100% >4 hours Average use 7 hours 29 mins Pressure 14cm h20 Leaks 31.5L/min AHI 0/8   Allergies  Allergen Reactions  . Lisinopril Cough    Immunization History  Administered Date(s) Administered  . Influenza Split 05/01/2011, 09/05/2013  . Influenza,inj,Quad PF,6+ Mos  05/27/2017, 04/20/2018  . Influenza-Unspecified 07/03/2016    Past Medical History:  Diagnosis Date  . Anal fissure    saw Dr. Mercy Riding   . Arthritis   . Diabetes mellitus   . GERD (gastroesophageal reflux disease)   . History of ovarian cancer in adulthood   . Hyperlipidemia   . Hypertension   . Ovarian cancer ([place])   . Skin cancer (melanoma) ([place])   . Tubular adenoma of colon 01/2014    Tobacco History: Social History   Tobacco Use  Smoking Status Never Smoker  Smokeless Tobacco Never Used   Counseling given: Not Answered   Outpatient Medications Prior to Visit  Medication Sig Dispense Refill  . atorvastatin (LIPITOR) 80 MG tablet Take 1 tablet (80 mg total) by mouth daily. 90 tablet 3  . Blood Glucose Monitoring Suppl (ONE TOUCH ULTRA MINI) w/Device KIT USE AS DIRECTED TO CHECK BLOOD SUGAR 1-2 TIMES DAILY. 1 each 0  . dicyclomine (BENTYL) 20 MG tablet Take 1 tablet (20 mg total) by mouth every 8 (eight) hours as needed (bowel cramps). 90 tablet 5  . glipiZIDE (GLUCOTROL) 10 MG tablet TAKE ONE TABLET BY MOUTH TWICE A DAY BEFORE A MEAL 60 tablet 10  . glucose blood test strip Check blood sugar 1-2 daily OneTouch Ultra Mini System Meter Dx code E11.9 200 each 3  . losartan-hydrochlorothiazide (HYZAAR) 50-12.5 MG tablet TAKE ONE TABLET BY MOUTH DAILY 30 tablet 2  . meloxicam (MOBIC) 15 MG tablet Take 1 tablet (15 mg total) by mouth daily.  30 tablet 11  . metFORMIN (GLUCOPHAGE) 1000 MG tablet TAKE 1 TABLET (1,000 MG TOTAL) BY MOUTH 2 (TWO) TIMES DAILY WITH A MEAL. 60 tablet 4  . metoprolol tartrate (LOPRESSOR) 50 MG tablet TAKE 1 TABLET BY MOUTH TWICE A DAY 60 tablet 2  . omeprazole (PRILOSEC) 40 MG capsule TAKE ONE CAPSULE BY MOUTH TWO TIMES A DAY 60 capsule 0  . ONE TOUCH LANCETS MISC Check blood sugar 1-2 daily for OneTouch Ultra Mini system Dx code: E11.9 200 each 3  . pioglitazone (ACTOS) 45 MG tablet Take 1 tablet (45 mg total) by mouth daily. 90 tablet 3  .  sertraline (ZOLOFT) 50 MG tablet Take 1 tablet (50 mg total) by mouth daily. 30 tablet 5  . albuterol (VENTOLIN HFA) 108 (90 Base) MCG/ACT inhaler Inhale into the lungs.    . AMBULATORY NON FORMULARY MEDICATION Diltiazem gel 2%  (with 5% lidocaine) Apply a pea sized amount internally three times daily for three weeks, as needed 30 g 0  . metFORMIN (GLUCOPHAGE) 1000 MG tablet Take 0.5 tablets (500 mg total) by mouth 2 (two) times daily with a meal. 60 tablet 6   No facility-administered medications prior to visit.    Review of Systems  Review of Systems  Constitutional: Positive for unexpected weight change. Negative for chills and fever.  HENT: Negative.   Respiratory: Negative.   Cardiovascular: Negative.    Physical Exam  BP 116/78 (BP Location: Left Arm, Patient Position: Sitting, Cuff Size: Large)   Pulse 70   Temp 98 F (36.7 C)   Ht _0  (1.626 m)   Wt 271 lb 3.2 oz (123 kg)   SpO2 97%   BMI 46.55 kg/m  Physical Exam Constitutional:      General: She is not in acute distress.    Appearance: She is well-developed. She is obese. She is not ill-appearing.  HENT:     Head: Normocephalic and atraumatic.     Right Ear: Tympanic membrane normal.     Left Ear: Tympanic membrane normal.  Eyes:     Pupils: Pupils are equal, round, and reactive to light.  Neck:     Musculoskeletal: Normal range of motion and neck supple.  Cardiovascular:     Rate and Rhythm: Normal rate and regular rhythm.     Heart sounds: Normal heart sounds. No murmur.  Pulmonary:     Effort: Pulmonary effort is normal. No respiratory distress.     Breath sounds: Normal breath sounds. No wheezing.  Skin:    General: Skin is warm and dry.     Findings: No erythema or rash.  Neurological:     General: No focal deficit present.     Mental Status: She is alert and oriented to person, place, and time. Mental status is at baseline.  Psychiatric:        Mood and Affect: Mood normal.        Behavior:  Behavior normal.        Thought Content: Thought content normal.        Judgment: Judgment normal.      Lab Results:  CBC    Component Value Date/Time   WBC 10.7 (H) 11/19/2017 1448   RBC 4.39 11/19/2017 1448   HGB 12.5 11/19/2017 1448   HCT 37.2 11/19/2017 1448   PLT 369.0 11/19/2017 1448   MCV 84.8 11/19/2017 1448   MCH 28.2 04/07/2011 2151   MCHC 33.7 11/19/2017 1448   RDW 16.0 (H) 11/19/2017 1448  LYMPHSABS 3.0 11/19/2017 1448   MONOABS 0.5 11/19/2017 1448   EOSABS 0.1 11/19/2017 1448   BASOSABS 0.1 11/19/2017 1448    BMET    Component Value Date/Time   NA 142 10/22/2016 1618   K 3.7 10/22/2016 1618   CL 104 10/22/2016 1618   CO2 30 10/22/2016 1618   GLUCOSE 123 (H) 10/22/2016 1618   BUN 19 10/22/2016 1618   CREATININE 0.77 10/22/2016 1618   CALCIUM 9.9 10/22/2016 1618   GFRNONAA 86 (L) 04/07/2011 2151   GFRAA >90 04/07/2011 2151    BNP No results found for: BNP  ProBNP No results found for: PROBNP  Imaging: No results found.   Assessment & Plan:   OSA on CPAP - 100% compliant with CPAP use - Pressure 14cm; AHI 0.8 - Patient would like to increase pressure to 15cm H20  - FU in 8-12 months   BMI 45.0-49.9, adult ([place]) - Long discussion on healthy weight practices and increasing physical activity >8-10 mins - Refer to health weight and wellness and nutrition    Martyn Ehrich, NP 02/04/2019

## 2019-02-04 NOTE — Assessment & Plan Note (Signed)
-   Long discussion on healthy weight practices and increasing physical activity >8-10 mins - Refer to health weight and wellness and nutrition

## 2019-02-04 NOTE — Patient Instructions (Addendum)
Referral: Health weight and wellness And nutrition   Change CPAP setting to 15cm H20   Rx: Albuterol as needed for shortness of breath/wheezing (refill sent)  Follow-up: 8 month FU with Halford Chessman

## 2019-02-07 ENCOUNTER — Encounter: Payer: Self-pay | Admitting: Family Medicine

## 2019-02-07 ENCOUNTER — Telehealth (INDEPENDENT_AMBULATORY_CARE_PROVIDER_SITE_OTHER): Payer: Commercial Managed Care - PPO | Admitting: Family Medicine

## 2019-02-07 ENCOUNTER — Other Ambulatory Visit: Payer: Self-pay

## 2019-02-07 ENCOUNTER — Telehealth: Payer: Self-pay

## 2019-02-07 DIAGNOSIS — E119 Type 2 diabetes mellitus without complications: Secondary | ICD-10-CM

## 2019-02-07 NOTE — Progress Notes (Signed)
Virtual Visit via Telephone Note  I connected with the patient on 02/07/19 at  3:00 PM EDT by telephone and verified that I am speaking with the correct person using two identifiers. We attempted to connect virtually but we had technical difficulties with the audio and video.     I discussed the limitations, risks, security and privacy concerns of performing an evaluation and management service by telephone and the availability of in person appointments. I also discussed with the patient that there may be a patient responsible charge related to this service. The patient expressed understanding and agreed to proceed.  Location patient: home Location provider: work or home office Participants present for the call: patient, provider Patient did not have a visit in the prior 7 days to address this/these issue(s).   History of Present Illness: Here to discuss her recent lab results. Her A1c has increased to 8.3, so we referred her to Endocrine. She is scheduled to see Dr. Kelton Pillar on 02-16-19. She admits to eating the wrong foods and to getting very little exercise. She feels well.    Observations/Objective: Patient sounds cheerful and well on the phone. I do not appreciate any SOB. Speech and thought processing are grossly intact. Patient reported vitals:  Assessment and Plan: Poorly controlled type 2 diabetes. She will meet with Endocrine and hopefully they can come up with a plan to help her. I reminded her that she has to start exercising regularly if she wants to get her weight under control.  [name], MD   Follow Up Instructions:     [phone] 5-10 [phone] 11-20 9443 21-30 I did not refer this patient for an OV in the next 24 hours for this/these issue(s).  I discussed the assessment and treatment plan with the patient. The patient was provided an opportunity to ask questions and all were answered. The patient agreed with the plan and demonstrated an understanding of the  instructions.   The patient was advised to call back or seek an in-person evaluation if the symptoms worsen or if the condition fails to improve as anticipated.  I provided 12 minutes of non-face-to-face time during this encounter.   [name], MD

## 2019-02-07 NOTE — Telephone Encounter (Signed)
From: Shanon Payor  Sent: 02/07/2019  2:42 PM EDT  To: [facility] Clinical Pool  Subject: Non-Urgent Medical Question             I am going to see a obgyn in September , can someone fax my last pap smear and last physical to fax number [phone]   address: [address] [place]: [phone]

## 2019-02-16 ENCOUNTER — Ambulatory Visit (INDEPENDENT_AMBULATORY_CARE_PROVIDER_SITE_OTHER): Payer: Commercial Managed Care - PPO | Admitting: Internal Medicine

## 2019-02-16 ENCOUNTER — Other Ambulatory Visit: Payer: Self-pay

## 2019-02-16 ENCOUNTER — Encounter: Payer: Self-pay | Admitting: Internal Medicine

## 2019-02-16 VITALS — BP 116/78 | HR 78 | Temp 98.3°F | Ht 64.0 in | Wt 269.2 lb

## 2019-02-16 DIAGNOSIS — F329 Major depressive disorder, single episode, unspecified: Secondary | ICD-10-CM | POA: Diagnosis not present

## 2019-02-16 DIAGNOSIS — E119 Type 2 diabetes mellitus without complications: Secondary | ICD-10-CM

## 2019-02-16 DIAGNOSIS — E1165 Type 2 diabetes mellitus with hyperglycemia: Secondary | ICD-10-CM | POA: Diagnosis not present

## 2019-02-16 DIAGNOSIS — F419 Anxiety disorder, unspecified: Secondary | ICD-10-CM | POA: Diagnosis not present

## 2019-02-16 DIAGNOSIS — F32A Depression, unspecified: Secondary | ICD-10-CM

## 2019-02-16 LAB — GLUCOSE, POCT (MANUAL RESULT ENTRY): POC Glucose: 190 mg/dl — AB (ref 70–99)

## 2019-02-16 MED ORDER — RYBELSUS 7 MG PO TABS: 7.0000 mg | ORAL_TABLET | Freq: Every day | ORAL | 6 refills | Status: DC

## 2019-02-16 NOTE — Progress Notes (Signed)
Name: Morgan [name]  MRN/ DOB: [id number], 10-09-1965   Age/ Sex: 53 y.o., female    PCP: Laurey Morale, MD   Reason for Endocrinology Evaluation: Type 2 Diabetes Mellitus     Date of Initial Endocrinology Visit: 02/17/2019     PATIENT IDENTIFIER: [name] is a 53 y.o. female with a past medical history of HTN, OSA,T2DM,and obesity, Ovarian cancer(S/P surgery )  and melanoma . The patient presented for initial endocrinology clinic visit on 02/17/2019 for consultative assistance with her diabetes management.    HPI: [name] was    Diagnosed with DM 2000 Prior Medications tried/Intolerance: as listed  Currently checking blood sugars 0 x / day Hypoglycemia episodes : no             Hemoglobin A1c has ranged from 6.8% in 2018, peaking at 8.3% in 2020. Patient required assistance for hypoglycemia: no Patient has required hospitalization within the last 1 year from hyper or hypoglycemia: no  In terms of diet, the patient drinks mountain dew once a day at work, the rest of the day she drinks water or juice. She eats hourly.   Lives alone, no pets   [employer] employee  HOME DIABETES REGIMEN: Metformin 1000 mg half a tablet BID  Glipizide 10 mg BID Actos 45 mg daily    Statin: yes ACE-I/ARB: yes  Prior Diabetic Education: scheduled 03/2019   METER DOWNLOAD SUMMARY: Did not bring    DIABETIC COMPLICATIONS: Microvascular complications:    Denies: retinopathy, CKD, neuropathy   Last eye exam: Completed 2.5 yrs  Macrovascular complications:    Denies: CAD, PVD, CVA   PAST HISTORY: Past Medical History:  Past Medical History:  Diagnosis Date  . Anal fissure    saw Dr. Mercy Riding   . Arthritis   . Diabetes mellitus   . GERD (gastroesophageal reflux disease)   . History of ovarian cancer in adulthood   . Hyperlipidemia   . Hypertension   . Ovarian cancer ([place])   . Skin cancer (melanoma) ([place])   . Tubular adenoma of colon 01/2014    Past Surgical History:  Past Surgical History:  Procedure Laterality Date  . ABDOMINAL HYSTERECTOMY  1986   BSO  . BASAL CELL CARCINOMA EXCISION  10-09-12   lower lip per Dr. Link Snuffer   . COLONOSCOPY  02-17-14   per Dr. Olevia Perches, adenomatous polyps, repeat in 5 yrs   . GLBS tumor ovary 1986    . HEMORRHOID SURGERY    . HIP FRACTURE SURGERY    . MELANOMA EXCISION  1997   upper chest       Social History:  reports that she has never smoked. She has never used smokeless tobacco. She reports that she does not drink alcohol or use drugs. Family History:  Family History  Problem Relation Age of Onset  . Ovarian cancer Sister   . Asthma Mother   . Arthritis Other   . Diabetes Other   . Prostate cancer Other   . Breast cancer Other   . Coronary artery disease Other   . Colon cancer Paternal Grandfather   . Pancreatic cancer Neg Hx   . Stomach cancer Neg Hx      HOME MEDICATIONS: Allergies as of 02/16/2019      Reactions   Lisinopril Cough      Medication List       Accurate as of February 16, 2019 11:59 PM. If you have any questions, ask your  nurse or doctor.        albuterol 108 (90 Base) MCG/ACT inhaler Commonly known as: VENTOLIN HFA Inhale 2 puffs into the lungs every 6 (six) hours as needed for wheezing or shortness of breath.   AMBULATORY NON FORMULARY MEDICATION Diltiazem gel 2%  (with 5% lidocaine) Apply a pea sized amount internally three times daily for three weeks, as needed   atorvastatin 80 MG tablet Commonly known as: LIPITOR Take 1 tablet (80 mg total) by mouth daily.   dicyclomine 20 MG tablet Commonly known as: Bentyl Take 1 tablet (20 mg total) by mouth every 8 (eight) hours as needed (bowel cramps).   glipiZIDE 10 MG tablet Commonly known as: GLUCOTROL TAKE ONE TABLET BY MOUTH TWICE A DAY BEFORE A MEAL   glucose blood test strip Check blood sugar 1-2 daily OneTouch Ultra Mini System Meter Dx code E11.9   losartan-hydrochlorothiazide  50-12.5 MG tablet Commonly known as: HYZAAR TAKE ONE TABLET BY MOUTH DAILY   meloxicam 15 MG tablet Commonly known as: MOBIC Take 1 tablet (15 mg total) by mouth daily.   metFORMIN 1000 MG tablet Commonly known as: GLUCOPHAGE TAKE 1 TABLET (1,000 MG TOTAL) BY MOUTH 2 (TWO) TIMES DAILY WITH A MEAL. What changed: Another medication with the same name was removed. Continue taking this medication, and follow the directions you see here. Changed by: Dorita Sciara, MD   metoprolol tartrate 50 MG tablet Commonly known as: LOPRESSOR TAKE 1 TABLET BY MOUTH TWICE A DAY   omeprazole 40 MG capsule Commonly known as: PRILOSEC TAKE ONE CAPSULE BY MOUTH TWO TIMES A DAY   ONE TOUCH LANCETS Misc Check blood sugar 1-2 daily for OneTouch Ultra Mini system Dx code: E11.9   ONE TOUCH ULTRA MINI w/Device Kit USE AS DIRECTED TO CHECK BLOOD SUGAR 1-2 TIMES DAILY.   pioglitazone 45 MG tablet Commonly known as: ACTOS Take 1 tablet (45 mg total) by mouth daily.   Rybelsus 7 MG Tabs Generic drug: Semaglutide Take 7 mg by mouth daily before breakfast. Started by: Dorita Sciara, MD   sertraline 50 MG tablet Commonly known as: ZOLOFT Take 1 tablet (50 mg total) by mouth daily.        ALLERGIES: Allergies  Allergen Reactions  . Lisinopril Cough     REVIEW OF SYSTEMS: A comprehensive ROS was conducted with the patient and is negative except as per HPI and below:  Review of Systems  Constitutional: Negative for fever and weight loss.  HENT: Negative for congestion and sore throat.   Eyes: Negative for blurred vision and pain.  Respiratory: Negative for cough and shortness of breath.   Cardiovascular: Negative for chest pain and palpitations.  Gastrointestinal: Negative for diarrhea and nausea.  Genitourinary: Positive for frequency.  Skin: Negative.   Neurological: Negative for tingling and tremors.  Endo/Heme/Allergies: Positive for polydipsia.  Psychiatric/Behavioral:  Positive for depression. The patient is not nervous/anxious.       OBJECTIVE:   VITAL SIGNS: BP 116/78 (BP Location: Right Arm, Patient Position: Sitting, Cuff Size: Large)   Pulse 78   Temp 98.3 F (36.8 C)   Ht '5\' 4"'  (1.626 m)   Wt 269 lb 3.2 oz (122.1 kg)   SpO2 96%   BMI 46.21 kg/m    PHYSICAL EXAM:  General: Pt in NAD  Hydration: Well-hydrated with moist mucous membranes and good skin turgor  HEENT: Head: Unremarkable with good dentition. Oropharynx clear without exudate.  Eyes: External eye exam normal without stare,  lid lag or exophthalmos.  EOM intact.   Neck: General: Supple without adenopathy or carotid bruits. Thyroid: Thyroid size normal.  No goiter or nodules appreciated. No thyroid bruit.  Lungs: Clear with good BS bilat with no rales, rhonchi, or wheezes  Heart: RRR with normal S1 and S2 and no gallops; no murmurs; no rub  Abdomen: Normoactive bowel sounds, soft, nontender, without masses or organomegaly palpable  Extremities:  Lower extremities - No pretibial edema. No lesions.  Skin: Normal texture and temperature to palpation. No rash noted. No Acanthosis nigricans/skin tags. No lipohypertrophy.  Neuro: MS is good with appropriate affect, pt is alert and Ox3     DATA REVIEWED:  Lab Results  Component Value Date   HGBA1C 8.3 (H) 01/25/2019   HGBA1C 7.7 (H) 07/07/2018   HGBA1C 7.5 (H) 04/02/2018   Lab Results  Component Value Date   MICROALBUR 2.9 (H) 11/15/2009   LDLCALC 97 11/28/2015   CREATININE 0.77 10/22/2016   Lab Results  Component Value Date   MICRALBCREAT 1.5 11/15/2009    Lab Results  Component Value Date   CHOL 165 01/25/2019   HDL 37.50 (L) 01/25/2019   LDLCALC 97 11/28/2015   LDLDIRECT 90.0 01/25/2019   TRIG 254.0 (H) 01/25/2019   CHOLHDL 4 01/25/2019        ASSESSMENT / PLAN / RECOMMENDATIONS:   1) Type 2 Diabetes Mellitus, Poorly Controlled, Without complications - Most recent A1c of 8.3 %. Goal A1c < 7.0 %.    Plan:  GENERAL: I have discussed with the patient the pathophysiology of diabetes. We went over the natural progression of the disease. We talked about both insulin resistance and insulin deficiency. We stressed the importance of lifestyle changes including diet and exercise. I explained the complications associated with diabetes including retinopathy, nephropathy, neuropathy as well as increased risk of cardiovascular disease. We went over the benefit seen with glycemic control.    I explained to the patient that diabetic patients are at higher than normal risk for amputations.   Ms. Stang admits to having an eating disorder, she had seen a psychiatrist in the past  But has not followed through. Her major barrier to diabetes self care is depression and anxiety, this will need to be addressed first , I have advised her to see a psychiatrist and a psychotherpist. She is scared of trying new medications, she is reluctant to make any changes to her regimen due to here severe anxiety. With her self reported poor control in eating, I suggested trying a GLP-1 agonists as they can be a good appetite suppressants , I cautioned her against GI side effects of vomiting/diarrhea but this is why we start small. She declined the injectable form, so will try Rybelsus. We discussed the other option is to increase Glipizide dose but that will increase her weight.  She is intolerant to higher doses of metformin   She will need baby steps in dietary education, pt did say she doesn;t like it when people tell her not to eat/drink something. Today we discussed trying diet drinks and working on that for now.   MEDICATIONS: - Continue Metformin half a tablet twice a day  - Continue Glipizide 10 mg,  1 tablet twice a day with meals (Breakfast and Supper) - Continue Pioglitazone 45 mg,  1 tablet once daily  - Start Rybelsus 3 mg daily   EDUCATION / INSTRUCTIONS:  BG monitoring instructions: Patient is instructed to check her  blood sugars 2 times a  day, 2, fasting and bedtime when possible.  Call [facility] if: BG persistently < 70 or > 300. . I reviewed the Rule of 15 for the treatment of hypoglycemia in detail with the patient. Literature supplied.   2) Diabetic complications:   Eye: Does not  have known diabetic retinopathy.   Neuro/ Feet: Does not have known diabetic peripheral neuropathy.  Renal: Patient does not have known baseline CKD.   3) Lipids: Patient is on lipitor 80 mg daily      4) Depression and anxiety :   - Pt admits to being previously diagnosed with an eating disorder, she also admits to depression and anxiety, which will make her diabetes care difficult. I suggested that she sees a therapist as well as a psychiatrist .   F/u in 2-3 months      Signed electronically by: Mack Guise, MD  [facility] Endocrinology  [facility] [address] [place], [place] [address] Phone: [phone] FAX: [phone]   CC: Laurey Morale, [place] [address] Phone: [phone]  Fax: [phone]    Return to Endocrinology clinic as below: Future Appointments  Date Time Provider [place]  03/09/2019  3:30 PM [facility] PREVISIT RM 51 [facility]  03/17/2019  4:15 PM Short, Max Sane, RD [facility] NDM  03/23/2019  9:00 AM Armbruster, [name], MD [facility]  05/20/2019  3:20 PM Joaquin Knebel, Melanie Crazier, MD [facility] None

## 2019-02-16 NOTE — Patient Instructions (Signed)
-   Continue Metformin half a tablet twice a day  - Continue Glipizide 1 tablet twice a day with meals (Breakfast and Supper) - Continue Pioglitazone 1 tablet once daily  - Start Rybelsus sample, one tablet daily, if you have severe nausea, or diarrhea please let us know and I will increase your Glipizide   - Try and check your sugar before Breakfast and supper when you can    - Choose healthy, lower carb lower calorie snacks: toss salad, cooked vegetables, cottage cheese, peanut butter, low fat cheese / string cheese, lower sodium deli meat, tuna salad or chicken salad     HOW TO TREAT LOW BLOOD SUGARS (Blood sugar LESS THAN 70 MG/DL)  Please follow the RULE OF 15 for the treatment of hypoglycemia treatment (when your (blood sugars are less than 70 mg/dL)    STEP 1: Take 15 grams of carbohydrates when your blood sugar is low, which includes:   3-4 GLUCOSE TABS  OR  3-4 OZ OF JUICE OR REGULAR SODA OR  ONE TUBE OF GLUCOSE GEL     STEP 2: RECHECK blood sugar in 15 MINUTES STEP 3: If your blood sugar is still low at the 15 minute recheck --> then, go back to STEP 1 and treat AGAIN with another 15 grams of carbohydrates.

## 2019-02-17 ENCOUNTER — Encounter: Payer: Self-pay | Admitting: Family Medicine

## 2019-02-17 DIAGNOSIS — F32A Depression, unspecified: Secondary | ICD-10-CM | POA: Insufficient documentation

## 2019-02-17 DIAGNOSIS — F419 Anxiety disorder, unspecified: Secondary | ICD-10-CM | POA: Insufficient documentation

## 2019-02-17 DIAGNOSIS — F329 Major depressive disorder, single episode, unspecified: Secondary | ICD-10-CM | POA: Insufficient documentation

## 2019-02-17 DIAGNOSIS — E1165 Type 2 diabetes mellitus with hyperglycemia: Secondary | ICD-10-CM | POA: Insufficient documentation

## 2019-02-18 ENCOUNTER — Encounter: Payer: Self-pay | Admitting: Internal Medicine

## 2019-02-18 NOTE — Telephone Encounter (Signed)
Tell her to please give the new medication a try. She would feel so much better if she can get some of the weight off

## 2019-02-21 ENCOUNTER — Encounter: Payer: Self-pay | Admitting: Internal Medicine

## 2019-02-21 ENCOUNTER — Other Ambulatory Visit: Payer: Self-pay | Admitting: Family Medicine

## 2019-02-21 ENCOUNTER — Encounter: Payer: Self-pay | Admitting: Family Medicine

## 2019-02-21 MED ORDER — ONETOUCH ULTRA VI STRP: ORAL_STRIP | 2 refills | Status: DC

## 2019-03-05 ENCOUNTER — Other Ambulatory Visit: Payer: Self-pay | Admitting: Family Medicine

## 2019-03-09 ENCOUNTER — Encounter: Payer: Self-pay | Admitting: Gastroenterology

## 2019-03-09 ENCOUNTER — Ambulatory Visit (AMBULATORY_SURGERY_CENTER): Payer: Self-pay | Admitting: *Deleted

## 2019-03-09 ENCOUNTER — Other Ambulatory Visit: Payer: Self-pay

## 2019-03-09 VITALS — Temp 96.6°F | Ht 64.0 in | Wt 269.2 lb

## 2019-03-09 DIAGNOSIS — Z86010 Personal history of colonic polyps: Secondary | ICD-10-CM

## 2019-03-09 MED ORDER — NA SULFATE-K SULFATE-MG SULF 17.5-3.13-1.6 GM/177ML PO SOLN: ORAL | 0 refills | Status: DC

## 2019-03-09 NOTE — Progress Notes (Signed)
Patient is here in-person for PV. Patient denies any allergies to eggs or soy. Patient denies any problems with anesthesia/sedation. Patient denies any oxygen use at home. Patient denies taking any diet/weight loss medications or blood thinners. EMMI education assisgned to patient on colonoscopy, this was explained and instructions given to patient.Pt is aware that care partner will wait in the car during procedure; if they feel like they will be too hot to wait in the car; they may wait in the lobby.  We want them to wear a mask (we do not have any that we can provide them), practice social distancing, and we will check their temperatures when they get here.  I did remind patient that their care partner needs to stay in the parking lot the entire time. Pt will wear mask into building. Suprep coupon given to pt.

## 2019-03-11 ENCOUNTER — Encounter: Payer: Self-pay | Admitting: Internal Medicine

## 2019-03-17 ENCOUNTER — Ambulatory Visit: Payer: Commercial Managed Care - PPO | Admitting: Dietician

## 2019-03-19 ENCOUNTER — Other Ambulatory Visit: Payer: Self-pay | Admitting: Gastroenterology

## 2019-03-19 ENCOUNTER — Other Ambulatory Visit: Payer: Self-pay | Admitting: Family Medicine

## 2019-03-22 ENCOUNTER — Telehealth: Payer: Self-pay

## 2019-03-22 NOTE — Telephone Encounter (Signed)
Covid-19 screening questions   Do you now or have you had a fever in the last 14 days?  Do you have any respiratory symptoms of shortness of breath or cough now or in the last 14 days?  Do you have any family members or close contacts with diagnosed or suspected Covid-19 in the past 14 days?  Have you been tested for Covid-19 and found to be positive?       

## 2019-03-22 NOTE — Telephone Encounter (Signed)
Patient returned call and answered NO to the Covid-19 screening questions ° °

## 2019-03-23 ENCOUNTER — Other Ambulatory Visit: Payer: Self-pay | Admitting: Gastroenterology

## 2019-03-23 ENCOUNTER — Other Ambulatory Visit: Payer: Self-pay

## 2019-03-23 ENCOUNTER — Ambulatory Visit (AMBULATORY_SURGERY_CENTER): Payer: Commercial Managed Care - PPO | Admitting: Gastroenterology

## 2019-03-23 ENCOUNTER — Encounter: Payer: Self-pay | Admitting: Gastroenterology

## 2019-03-23 VITALS — BP 118/74 | HR 71 | Temp 98.5°F | Resp 13 | Ht 64.0 in | Wt 269.0 lb

## 2019-03-23 DIAGNOSIS — Z86010 Personal history of colonic polyps: Secondary | ICD-10-CM | POA: Diagnosis present

## 2019-03-23 DIAGNOSIS — D123 Benign neoplasm of transverse colon: Secondary | ICD-10-CM | POA: Diagnosis not present

## 2019-03-23 DIAGNOSIS — D125 Benign neoplasm of sigmoid colon: Secondary | ICD-10-CM

## 2019-03-23 DIAGNOSIS — D128 Benign neoplasm of rectum: Secondary | ICD-10-CM

## 2019-03-23 DIAGNOSIS — K635 Polyp of colon: Secondary | ICD-10-CM

## 2019-03-23 DIAGNOSIS — D127 Benign neoplasm of rectosigmoid junction: Secondary | ICD-10-CM

## 2019-03-23 HISTORY — PX: COLONOSCOPY: SHX174

## 2019-03-23 MED ORDER — SODIUM CHLORIDE 0.9 % IV SOLN: 500.0000 mL | Freq: Once | INTRAVENOUS | Status: DC

## 2019-03-23 NOTE — Op Note (Signed)
[facility] Patient Name: Morgan [name] Procedure Date: 03/23/2019 8:53 AM MRN: [id number] Endoscopist: Remo Lipps P. Havery Moros , MD Age: 53 Referring MD:  Date of Birth: [birth year] Gender: Female Account #: [id number] Procedure:                Colonoscopy Indications:              Surveillance: Personal history of adenomatous                            polyps on last colonoscopy 5 years ago Medicines:                Monitored Anesthesia Care Procedure:                Pre-Anesthesia Assessment:                           - Prior to the procedure, a History and Physical                            was performed, and patient medications and                            allergies were reviewed. The patient's tolerance of                            previous anesthesia was also reviewed. The risks                            and benefits of the procedure and the sedation                            options and risks were discussed with the patient.                            All questions were answered, and informed consent                            was obtained. Prior Anticoagulants: The patient has                            taken no previous anticoagulant or antiplatelet                            agents. ASA Grade Assessment: III - A patient with                            severe systemic disease. After reviewing the risks                            and benefits, the patient was deemed in                            satisfactory condition to undergo the procedure.  After obtaining informed consent, the colonoscope                            was passed under direct vision. Throughout the                            procedure, the patient's blood pressure, pulse, and                            oxygen saturations were monitored continuously. The                            Colonoscope was introduced through the anus and                            advanced to  the the cecum, identified by                            appendiceal orifice and ileocecal valve. The                            colonoscopy was performed without difficulty. The                            patient tolerated the procedure well. The quality                            of the bowel preparation was adequate. The                            ileocecal valve, appendiceal orifice, and rectum                            were photographed. Scope In: 8:58:17 AM Scope Out: 9:21:26 AM Scope Withdrawal Time: 0 hours 17 minutes 35 seconds  Total Procedure Duration: 0 hours 23 minutes 9 seconds  Findings:                 The perianal and digital rectal examinations were                            normal.                           Four sessile polyps were found in the transverse                            colon. The polyps were 3 to 4 mm in size. These                            polyps were removed with a cold snare. Resection                            and retrieval were complete.  A 3 mm polyp was found in the splenic flexure. The                            polyp was sessile. The polyp was removed with a                            cold snare. Resection and retrieval were complete.                           Two sessile polyps were found in the sigmoid colon.                            The polyps were 3 to 4 mm in size. These polyps                            were removed with a cold snare. Resection and                            retrieval were complete.                           A diminutive polyp was found in the rectum. The                            polyp was sessile. The polyp was removed with a                            cold snare. Resection and retrieval were complete.                           A few small-mouthed diverticula were found in the                            transverse colon and left colon.                           Internal hemorrhoids were  found during                            retroflexion. The hemorrhoids were moderate.                           The exam was otherwise without abnormality. Complications:            No immediate complications. Estimated blood loss:                            Minimal. Estimated Blood Loss:     Estimated blood loss was minimal. Impression:               - Four 3 to 4 mm polyps in the transverse colon,                            removed with a  cold snare. Resected and retrieved.                           - One 3 mm polyp at the splenic flexure, removed                            with a cold snare. Resected and retrieved.                           - Two 3 to 4 mm polyps in the sigmoid colon,                            removed with a cold snare. Resected and retrieved.                           - One diminutive polyp in the rectum, removed with                            a cold snare. Resected and retrieved.                           - Diverticulosis in the transverse colon and in the                            left colon.                           - Internal hemorrhoids.                           - The examination was otherwise normal. Recommendation:           - Patient has a contact number available for                            emergencies. The signs and symptoms of potential                            delayed complications were discussed with the                            patient. Return to normal activities tomorrow.                            Written discharge instructions were provided to the                            patient.                           - Resume previous diet.                           - Continue present medications.                           -  Await pathology results. Remo Lipps P. Havery Moros, MD 03/23/2019 9:26:24 AM This report has been signed electronically.

## 2019-03-23 NOTE — Progress Notes (Signed)
Pt's states no medical or surgical changes since previsit or office visit.  KA - temp CW - vitals 

## 2019-03-23 NOTE — Patient Instructions (Signed)
YOU HAD AN ENDOSCOPIC PROCEDURE TODAY AT THE [facility]:   Refer to the procedure report that was given to you for any specific questions about what was found during the examination.  If the procedure report does not answer your questions, please call your gastroenterologist to clarify.  If you requested that your care partner not be given the details of your procedure findings, then the procedure report has been included in a sealed envelope for you to review at your convenience later.  YOU SHOULD EXPECT: Some feelings of bloating in the abdomen. Passage of more gas than usual.  Walking can help get rid of the air that was put into your GI tract during the procedure and reduce the bloating. If you had a lower endoscopy (such as a colonoscopy or flexible sigmoidoscopy) you may notice spotting of blood in your stool or on the toilet paper. If you underwent a bowel prep for your procedure, you may not have a normal bowel movement for a few days.  Please Note:  You might notice some irritation and congestion in your nose or some drainage.  This is from the oxygen used during your procedure.  There is no need for concern and it should clear up in a day or so.  SYMPTOMS TO REPORT IMMEDIATELY:   Following lower endoscopy (colonoscopy or flexible sigmoidoscopy):  Excessive amounts of blood in the stool  Significant tenderness or worsening of abdominal pains  Swelling of the abdomen that is new, acute  Fever of 100F or higher  For urgent or emergent issues, a gastroenterologist can be reached at any hour by calling [phone].   DIET:  We do recommend a small meal at first, but then you may proceed to your regular diet.  Drink plenty of fluids but you should avoid alcoholic beverages for 24 hours.  ACTIVITY:  You should plan to take it easy for the rest of today and you should NOT DRIVE or use heavy machinery until tomorrow (because of the sedation medicines used during the test).     FOLLOW UP: Our staff will call the number listed on your records 48-72 hours following your procedure to check on you and address any questions or concerns that you may have regarding the information given to you following your procedure. If we do not reach you, we will leave a message.  We will attempt to reach you two times.  During this call, we will ask if you have developed any symptoms of COVID 19. If you develop any symptoms (ie: fever, flu-like symptoms, shortness of breath, cough etc.) before then, please call [phone].  If you test positive for Covid 19 in the 2 weeks post procedure, please call and report this information to us.    If any biopsies were taken you will be contacted by phone or by letter within the next 1-3 weeks.  Please call us at [phone] if you have not heard about the biopsies in 3 weeks.    SIGNATURES/CONFIDENTIALITY: You and/or your care partner have signed paperwork which will be entered into your electronic medical record.  These signatures attest to the fact that that the information above on your After Visit Summary has been reviewed and is understood.  Full responsibility of the confidentiality of this discharge information lies with you and/or your care-partner. 

## 2019-03-23 NOTE — Progress Notes (Signed)
Called to room to assist during endoscopic procedure.  Patient ID and intended procedure confirmed with present staff. Received instructions for my participation in the procedure from the performing physician.  

## 2019-03-23 NOTE — Progress Notes (Signed)
Report to PACU, RN, vss, BBS= Clear.  

## 2019-03-25 ENCOUNTER — Telehealth: Payer: Self-pay | Admitting: *Deleted

## 2019-03-25 NOTE — Telephone Encounter (Signed)
  Follow up Call-  Call back number 03/23/2019  Post procedure Call Back phone  # [phone]  Permission to leave phone message Yes  Some recent data might be hidden     Patient questions:  Do you have a fever, pain , or abdominal swelling? No. Pain Score  0 *  Have you tolerated food without any problems? Yes.    Have you been able to return to your normal activities? Yes.    Do you have any questions about your discharge instructions: Diet   No. Medications  No. Follow up visit  No.  Do you have questions or concerns about your Care? No.  Actions: * If pain score is 4 or above: No action needed, pain <4.  1. Have you developed a fever since your procedure? no  2.   Have you had an respiratory symptoms (SOB or cough) since your procedure? no  3.   Have you tested positive for COVID 19 since your procedure no  4.   Have you had any family members/close contacts diagnosed with the COVID 19 since your procedure? no   If yes to any of these questions please route to Joylene John, RN and Alphonsa Gin, Therapist, sports.

## 2019-03-28 ENCOUNTER — Encounter: Payer: Self-pay | Admitting: Family Medicine

## 2019-03-29 ENCOUNTER — Telehealth: Payer: Self-pay

## 2019-03-29 NOTE — Telephone Encounter (Signed)
Called patient and let her know the final pathology is still pending from her colonoscopy, and per Dr. Havery Moros It could be up to 1 week before the final result is done. Dr. Havery Moros will review the results and then release them to her.

## 2019-03-31 ENCOUNTER — Other Ambulatory Visit: Payer: Self-pay

## 2019-03-31 ENCOUNTER — Encounter: Payer: Commercial Managed Care - PPO | Attending: Primary Care | Admitting: Dietician

## 2019-03-31 ENCOUNTER — Encounter: Payer: Self-pay | Admitting: Dietician

## 2019-03-31 DIAGNOSIS — E1165 Type 2 diabetes mellitus with hyperglycemia: Secondary | ICD-10-CM | POA: Insufficient documentation

## 2019-03-31 NOTE — Progress Notes (Signed)
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   Pt states she is an emotional eater, and that she knows that. States she struggles with depression and anxiety and would like to get better, but does not have enough will power to make any dietary changes. Lives alone, therefore struggles with accountability. States she knows what she should and should not be eating but has hard time with self control. Would like to control her A1c and come off of some medications, but that the same time states she loves food too much to care.   Frequent foods include pasta, [employer] (cheeseburger with fries), Mtn Dew, pizza, donuts, snack/junk foods. States she used to eat constantly (hourly) out of stress, love for food, and never feeling "full." States that, since starting new diabetes medication, her appetite has decreased a lot which helps her not eat constantly. States that she will often eat her dinner meal and pack up the other serving to take for lunch the next day. However, states she gives in and ends up eating it later on that evening and then has to come up with something else to do for lunch the next day.   24-Hr Dietary Recall First Meal: none  Snack: Debbie cake (or cucumbers + vinaigrette dressing) (or Triscuit crackers + cheese)  Second Meal: frozen meal  Snack: apple + peanut butter (or donuts)  Third Meal:  pasta dish Snack: other serving of pasta dish  Beverages: water, [employer]   Estimated Energy Needs Calories: 1600 Carbohydrate: 180g Protein: 100g Fat: 53g   NUTRITION DIAGNOSIS  Excessive energy intake (NI-1.3) related to binge eating as evidenced by reported dietary intake of large amounts of calorie-dense foods and beverages and hx of counseling for disordered eating pattern.    NUTRITION INTERVENTION  Nutrition education (E-1) on the following topics:  . Balanced, healthful eating: focused on how to build balanced meals (using the MyPlate model) by incorporating veggies, complex carbs, and lean protein. Educated on how to choose balanced snacks which incorporate carbs and protein for blood sugar stabilization.  . Relationship with food/ emotional eating: discussed finding an accountability partner to call when bored or going back for more food/snacks. Also, brainstormed ideas of good snacks to have on hand to have in the evenings instead of eating the other serving of dinner.   Handouts Provided Include   MyPlate & Meal Ideas   Snack Ideas   Learning Style & Readiness for Change Teaching method utilized: Visual & Auditory  Demonstrated degree of understanding via: Teach Back  Barriers to learning/adherence to lifestyle change: Emotional Eating/ Hx of Eating Disorder     MONITORING & EVALUATION Dietary intake, weekly physical activity, and goals prn.   Next Steps  Patient is to contact [facility] to schedule follow up appointment as needed/desired.

## 2019-04-01 ENCOUNTER — Other Ambulatory Visit: Payer: Self-pay | Admitting: Family Medicine

## 2019-04-04 ENCOUNTER — Telehealth: Payer: Self-pay

## 2019-04-04 NOTE — Telephone Encounter (Signed)
Called patient and gave the results from her colonoscopy,  that Dr. Havery Moros had sent to her My Chart. She had not been able to see them

## 2019-04-06 ENCOUNTER — Other Ambulatory Visit: Payer: Self-pay | Admitting: Family Medicine

## 2019-04-06 DIAGNOSIS — Z1231 Encounter for screening mammogram for malignant neoplasm of breast: Secondary | ICD-10-CM

## 2019-04-13 ENCOUNTER — Other Ambulatory Visit: Payer: Self-pay | Admitting: Family Medicine

## 2019-04-20 ENCOUNTER — Other Ambulatory Visit: Payer: Self-pay | Admitting: Gastroenterology

## 2019-04-20 ENCOUNTER — Other Ambulatory Visit: Payer: Self-pay | Admitting: Family Medicine

## 2019-05-12 ENCOUNTER — Encounter: Payer: Self-pay | Admitting: Internal Medicine

## 2019-05-14 ENCOUNTER — Encounter: Payer: Self-pay | Admitting: Internal Medicine

## 2019-05-15 ENCOUNTER — Encounter: Payer: Self-pay | Admitting: Internal Medicine

## 2019-05-16 ENCOUNTER — Other Ambulatory Visit: Payer: Self-pay

## 2019-05-16 ENCOUNTER — Ambulatory Visit
Admission: RE | Admit: 2019-05-16 | Discharge: 2019-05-16 | Disposition: A | Discharge status: Home / Self Care | Payer: Commercial Managed Care - PPO | Source: Ambulatory Visit | Attending: Family Medicine | Admitting: Family Medicine

## 2019-05-16 DIAGNOSIS — Z1231 Encounter for screening mammogram for malignant neoplasm of breast: Secondary | ICD-10-CM

## 2019-05-16 MED ORDER — RYBELSUS 7 MG PO TABS: 7.0000 mg | ORAL_TABLET | Freq: Every day | ORAL | 0 refills | Status: DC

## 2019-05-16 NOTE — Telephone Encounter (Signed)
Please see other mychart message

## 2019-05-18 ENCOUNTER — Encounter: Payer: Self-pay | Admitting: Family Medicine

## 2019-05-18 ENCOUNTER — Other Ambulatory Visit: Payer: Self-pay

## 2019-05-20 ENCOUNTER — Ambulatory Visit (INDEPENDENT_AMBULATORY_CARE_PROVIDER_SITE_OTHER): Payer: Commercial Managed Care - PPO | Admitting: Internal Medicine

## 2019-05-20 ENCOUNTER — Encounter: Payer: Self-pay | Admitting: Internal Medicine

## 2019-05-20 VITALS — BP 106/72 | HR 82 | Ht 64.0 in | Wt 266.0 lb

## 2019-05-20 DIAGNOSIS — E119 Type 2 diabetes mellitus without complications: Secondary | ICD-10-CM | POA: Diagnosis not present

## 2019-05-20 LAB — POCT GLYCOSYLATED HEMOGLOBIN (HGB A1C): Hemoglobin A1C: 5.8 % — AB (ref 4.0–5.6)

## 2019-05-20 MED ORDER — RYBELSUS 7 MG PO TABS: 7.0000 mg | ORAL_TABLET | Freq: Every day | ORAL | 3 refills | Status: DC

## 2019-05-20 MED ORDER — GLIPIZIDE 10 MG PO TABS: 5.0000 mg | ORAL_TABLET | Freq: Two times a day (BID) | ORAL | 3 refills | Status: DC

## 2019-05-20 NOTE — Patient Instructions (Signed)
-   STOP Pioglitazone   - Decrease Glipizide to HALF a tablet before Breakfast and Supper - Continue Metformin half a tablet twice a day  - Continue Rybelsus 7 mg daily    - Try and check your sugar before Breakfast and supper when you can     HOW TO TREAT LOW BLOOD SUGARS (Blood sugar LESS THAN 70 MG/DL)  Please follow the RULE OF 15 for the treatment of hypoglycemia treatment (when your (blood sugars are less than 70 mg/dL)    STEP 1: Take 15 grams of carbohydrates when your blood sugar is low, which includes:   3-4 GLUCOSE TABS  OR  3-4 OZ OF JUICE OR REGULAR SODA OR  ONE TUBE OF GLUCOSE GEL     STEP 2: RECHECK blood sugar in 15 MINUTES STEP 3: If your blood sugar is still low at the 15 minute recheck --> then, go back to STEP 1 and treat AGAIN with another 15 grams of carbohydrates.

## 2019-05-20 NOTE — Progress Notes (Signed)
Name: Morgan [name]  Age/ Sex: 53 y.o., female   MRN/ DOB: [id number], [birth year]     PCP: Laurey Morale, MD   Reason for Endocrinology Evaluation: Type 2 Diabetes Mellitus  Initial Endocrine Consultative Visit: 02/16/2019    PATIENT IDENTIFIER: [name] is a 52 y.o. female with a past medical history of HTN, OSA,T2DM,and obesity, Ovarian cancer(S/P surgery )  and melanoma. The patient has followed with Endocrinology clinic since 02/16/2019 for consultative assistance with management of her diabetes.  DIABETIC HISTORY:  Morgan [name] was diagnosed with T2DM in 2000. She has been on Metformin, Glipizide and Actos since her diagnosis. Her hemoglobin A1c has ranged from 6.8% in 2018, peaking at 8.3% in 2020.  Rybelsus added in 01/2019  SUBJECTIVE:   During the last visit (02/17/19): Continued Metformin, Glipizide, Pioglitazone and started Rybelsus   Today (05/23/2019): [name] is here for a 3 month follow up on diabetes management.   She checks her blood sugars 2 times daily, preprandial to breakfast and bedtime. The patient has had hypoglycemic episodes since the last clinic visit, which typically occur 2 x /month - most often occuring fasting. The patient is  symptomatic with these episodes, with symptoms of blurry vision. Otherwise, the patient has not required any recent emergency interventions for hypoglycemia and has not had recent hospitalizations secondary to hyper or hypoglycemic episodes.    ROS: As per HPI and as detailed below: Review of Systems  Constitutional: Negative for chills and fever.  HENT: Negative for congestion and sore throat.   Respiratory: Negative for cough and shortness of breath.   Cardiovascular: Negative for chest pain and palpitations.  Gastrointestinal: Negative for diarrhea and nausea.      HOME DIABETES REGIMEN:  - Metformin half a tablet twice a day  - Glipizide 10 mg,  1 tablet twice a day with meals (Breakfast  and Supper) - Pioglitazone 45 mg, Half tablet daily  - Rybelsus 7 mg daily    Glucose Log : Fasting 67- 117 mg/dL   HISTORY:  Past Medical History:  Past Medical History:  Diagnosis Date  . Anal fissure    saw Dr. Mercy Riding   . Anxiety   . Arthritis   . COPD (chronic obstructive pulmonary disease) ([place])   . Depression   . Diabetes mellitus   . GERD (gastroesophageal reflux disease)   . History of ovarian cancer in adulthood   . Hyperlipidemia   . Hypertension   . Ovarian cancer ([place])   . Skin cancer (melanoma) ([place])   . Sleep apnea    uses CPAP   . Tubular adenoma of colon 01/2014   Past Surgical History:  Past Surgical History:  Procedure Laterality Date  . ABDOMINAL HYSTERECTOMY  1986   BSO  . BASAL CELL CARCINOMA EXCISION  10-09-12   lower lip per Dr. Link Snuffer   . COLONOSCOPY  02-17-14   per Dr. Olevia Perches, adenomatous polyps, repeat in 5 yrs   . GLBS tumor ovary 1986    . HEMORRHOID SURGERY    . HIP FRACTURE SURGERY    . MELANOMA EXCISION  1997   upper chest     Social History:  reports that she has never smoked. She has never used smokeless tobacco. She reports that she does not drink alcohol or use drugs. Family History:  Family History  Problem Relation Age of Onset  . Ovarian cancer Sister   . Asthma Mother   . Arthritis Other   .  Diabetes Other   . Prostate cancer Other   . Breast cancer Other   . Coronary artery disease Other   . Colon cancer Paternal Grandfather   . Pancreatic cancer Neg Hx   . Stomach cancer Neg Hx   . Esophageal cancer Neg Hx   . Rectal cancer Neg Hx   . Colon polyps Neg Hx      HOME MEDICATIONS: Allergies as of 05/20/2019      Reactions   Lisinopril Cough      Medication List       Accurate as of May 20, 2019 11:59 PM. If you have any questions, ask your nurse or doctor.        STOP taking these medications   pioglitazone 45 MG tablet Commonly known as: ACTOS Stopped by: Dorita Sciara, MD      TAKE these medications   atorvastatin 80 MG tablet Commonly known as: LIPITOR TAKE ONE TABLET BY MOUTH DAILY   dicyclomine 20 MG tablet Commonly known as: Bentyl Take 1 tablet (20 mg total) by mouth every 8 (eight) hours as needed (bowel cramps).   glipiZIDE 10 MG tablet Commonly known as: GLUCOTROL Take 0.5 tablets (5 mg total) by mouth 2 (two) times daily before a meal. What changed: See the new instructions. Changed by: Dorita Sciara, MD   losartan-hydrochlorothiazide 50-12.5 MG tablet Commonly known as: HYZAAR TAKE ONE TABLET BY MOUTH DAILY   metFORMIN 1000 MG tablet Commonly known as: GLUCOPHAGE TAKE 1 TABLET BY MOUTH 2 TIMES DAILY WITH A MEAL   metoprolol tartrate 50 MG tablet Commonly known as: LOPRESSOR TAKE ONE TABLET BY MOUTH TWICE A DAY   omeprazole 40 MG capsule Commonly known as: PRILOSEC Take 1 capsule (40 mg total) by mouth 2 (two) times daily.   ONE TOUCH LANCETS Misc Check blood sugar 1-2 daily for OneTouch Ultra Mini system Dx code: E11.9   ONE TOUCH ULTRA MINI w/Device Kit USE AS DIRECTED TO CHECK BLOOD SUGAR 1-2 TIMES DAILY   OneTouch Ultra test strip Generic drug: glucose blood TEST 1-2 TIMES A DAY   Rybelsus 7 MG Tabs Generic drug: Semaglutide Take 7 mg by mouth daily before breakfast.   sertraline 50 MG tablet Commonly known as: ZOLOFT TAKE ONE TABLET BY MOUTH DAILY        OBJECTIVE:   Vital Signs: BP 106/72   Pulse 82   Ht '5\' 4"'  (1.626 m)   Wt 266 lb (120.7 kg)   LMP 03/22/1986   SpO2 96%   BMI 45.66 kg/m   Wt Readings from Last 3 Encounters:  05/20/19 266 lb (120.7 kg)  03/31/19 267 lb (121.1 kg)  03/23/19 269 lb (122 kg)     Exam: General: Pt appears well and is in NAD  Lungs: Clear with good BS bilat with no rales, rhonchi, or wheezes  Heart: RRR with normal S1 and S2 and no gallops; no murmurs; no rub  Abdomen: Normoactive bowel sounds, soft, nontender, without masses or organomegaly palpable  Extremities:  No pretibial edema.  Skin: Normal texture and temperature to palpation.  Neuro: MS is good with appropriate affect, pt is alert and Ox3          DATA REVIEWED:  Lab Results  Component Value Date   HGBA1C 5.8 (A) 05/20/2019   HGBA1C 8.3 (H) 01/25/2019   HGBA1C 7.7 (H) 07/07/2018   Lab Results  Component Value Date   MICROALBUR 2.9 (H) 11/15/2009   [place] 97 11/28/2015  CREATININE 0.77 10/22/2016   Lab Results  Component Value Date   MICRALBCREAT 1.5 11/15/2009     Lab Results  Component Value Date   CHOL 165 01/25/2019   HDL 37.50 (L) 01/25/2019   LDLCALC 97 11/28/2015   LDLDIRECT 90.0 01/25/2019   TRIG 254.0 (H) 01/25/2019   CHOLHDL 4 01/25/2019         ASSESSMENT / PLAN / RECOMMENDATIONS:   1) Type 2 Diabetes Mellitus, Poorly Controlled, Without complications - Most recent A1c of 5.8 %. Goal A1c < 7.0 %.    - Overall her glucose readings look a lot better since her last visit. She is tolerating the rybelsus without side effects.  - She is having recurrent hypoglycemia in the morning despite reducing the pioglitazone dose, pt continues to drink sugar sweetened beverages and eating oreos at bedtime    MEDICATIONS: - STOP Pioglitazone   - Decrease Glipizide 5 mg before Breakfast and Supper - Continue Metformin 1000 mg  half a tablet twice a day  - Continue Rybelsus 7 mg daily    EDUCATION / INSTRUCTIONS:  BG monitoring instructions: Patient is instructed to check her blood sugars 2 times a day, fasting and bedtime .  Call [facility] if: BG persistently < 70 or > 300. . I reviewed the Rule of 15 for the treatment of hypoglycemia in detail with the patient. Literature supplied.    2) Nigh Eating Syndrome :   - Pt fits criteria with no appetite to eat in the morning but eats 25% of her daily calories after dinner, she admits to eating hourly from the time she gets home to the time she goes to bed . She used to get up in the middle of  the night and would eat but since being on sertraline she stopped getting up in the middle of the night.  - I have offered to increase sertraline to 100 mg but she declined.  - I have discussed with her that sertraline is one of few medications tested for this.     F/U in 3 months    Signed electronically by: Mack Guise, MD  [facility]  [address] [place], [place] [address] Phone: [phone] FAX: [phone]   CC: Laurey Morale, [place] [address] Phone: [phone]  Fax: [phone]  Return to Endocrinology clinic as below: Future Appointments  Date Time Provider [place]  05/24/2019 10:30 AM Laurey Morale, MD [facility]  08/24/2019  3:40 PM Chaniyah Jahr, Melanie Crazier, MD [facility] None

## 2019-05-20 NOTE — Telephone Encounter (Signed)
Tell her the mammogram was completely normal

## 2019-05-23 ENCOUNTER — Telehealth: Payer: Self-pay | Admitting: Internal Medicine

## 2019-05-23 MED ORDER — GLIPIZIDE 5 MG PO TABS: 5.0000 mg | ORAL_TABLET | Freq: Two times a day (BID) | ORAL | 1 refills | Status: DC

## 2019-05-23 NOTE — Telephone Encounter (Signed)
Please advise 

## 2019-05-23 NOTE — Telephone Encounter (Signed)
Patient wanted to advise Dr Kelton Pillar that she is unable to cut the 10 MG Glipizide tablets in half due the small size.  Since she takes 1/2 a tablet or 5 MG twice a day she wanted to see if an RX for 5 MG could be called in for her

## 2019-05-24 ENCOUNTER — Ambulatory Visit: Payer: Commercial Managed Care - PPO | Admitting: Family Medicine

## 2019-06-17 ENCOUNTER — Encounter: Payer: Self-pay | Admitting: Family Medicine

## 2019-06-17 NOTE — Telephone Encounter (Signed)
Let's do this in January (that will be 6 months from last time)

## 2019-06-17 NOTE — Telephone Encounter (Signed)
Please advise. It looks like she was to see endo for this.

## 2019-06-19 ENCOUNTER — Other Ambulatory Visit: Payer: Self-pay | Admitting: Family Medicine

## 2019-06-20 ENCOUNTER — Other Ambulatory Visit: Payer: Self-pay | Admitting: Family Medicine

## 2019-06-28 LAB — HM DIABETES EYE EXAM

## 2019-06-29 ENCOUNTER — Encounter: Payer: Self-pay | Admitting: Family Medicine

## 2019-07-03 ENCOUNTER — Other Ambulatory Visit: Payer: Self-pay | Admitting: Family Medicine

## 2019-07-12 ENCOUNTER — Other Ambulatory Visit: Payer: Self-pay | Admitting: Family Medicine

## 2019-07-16 ENCOUNTER — Encounter: Payer: Self-pay | Admitting: Family Medicine

## 2019-07-18 NOTE — Telephone Encounter (Signed)
Set up an in person OV for this  

## 2019-07-21 ENCOUNTER — Other Ambulatory Visit: Payer: Self-pay

## 2019-07-21 ENCOUNTER — Encounter: Payer: Self-pay | Admitting: Family Medicine

## 2019-07-21 ENCOUNTER — Ambulatory Visit (INDEPENDENT_AMBULATORY_CARE_PROVIDER_SITE_OTHER): Payer: Commercial Managed Care - PPO | Admitting: Family Medicine

## 2019-07-21 VITALS — BP 130/80 | HR 82 | Temp 97.3°F | Wt 263.6 lb

## 2019-07-21 DIAGNOSIS — F419 Anxiety disorder, unspecified: Secondary | ICD-10-CM | POA: Diagnosis not present

## 2019-07-21 DIAGNOSIS — F32A Depression, unspecified: Secondary | ICD-10-CM

## 2019-07-21 DIAGNOSIS — R0789 Other chest pain: Secondary | ICD-10-CM

## 2019-07-21 DIAGNOSIS — F329 Major depressive disorder, single episode, unspecified: Secondary | ICD-10-CM | POA: Diagnosis not present

## 2019-07-21 MED ORDER — DICLOFENAC SODIUM 75 MG PO TBEC: 75.0000 mg | DELAYED_RELEASE_TABLET | Freq: Two times a day (BID) | ORAL | 2 refills | Status: DC

## 2019-07-21 MED ORDER — SERTRALINE HCL 100 MG PO TABS: 100.0000 mg | ORAL_TABLET | Freq: Every day | ORAL | 5 refills | Status: DC
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
## 2019-07-25 ENCOUNTER — Telehealth: Payer: Self-pay | Admitting: *Deleted

## 2019-07-25 ENCOUNTER — Encounter: Payer: Self-pay | Admitting: Family Medicine
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
## 2019-07-26 ENCOUNTER — Encounter: Payer: Self-pay | Admitting: Family Medicine

## 2019-07-26 MED ORDER — TRAMADOL HCL 50 MG PO TABS: 100.0000 mg | ORAL_TABLET | Freq: Four times a day (QID) | ORAL | 0 refills | Status: DC | PRN

## 2019-07-26 NOTE — Telephone Encounter (Signed)
I sent in Tramadol for pain. I think this will improve over the next week so be patient

## 2019-07-26 NOTE — Telephone Encounter (Signed)
Spoke with the patient. She stated she does not want to take the tramadol but that she is starting to notice some improvement.   Will send to Dr. Sarajane Jews as Juluis Rainier.

## 2019-07-26 NOTE — Telephone Encounter (Signed)
Noted. Nothing further needed. 
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
## 2019-07-26 NOTE — Telephone Encounter (Signed)
This should go to the business office

## 2019-07-27 ENCOUNTER — Encounter: Payer: Self-pay | Admitting: Internal Medicine

## 2019-08-08 ENCOUNTER — Encounter: Payer: Self-pay | Admitting: Family Medicine

## 2019-08-09 NOTE — Telephone Encounter (Signed)
Set up an in person OV to re-evaluate this pain

## 2019-08-11 ENCOUNTER — Other Ambulatory Visit: Payer: Self-pay

## 2019-08-11 ENCOUNTER — Other Ambulatory Visit: Payer: Self-pay | Admitting: Family Medicine

## 2019-08-11 ENCOUNTER — Other Ambulatory Visit: Payer: Self-pay | Admitting: Gastroenterology

## 2019-08-12 ENCOUNTER — Ambulatory Visit (INDEPENDENT_AMBULATORY_CARE_PROVIDER_SITE_OTHER): Payer: Commercial Managed Care - PPO | Admitting: Family Medicine

## 2019-08-12 ENCOUNTER — Encounter: Payer: Self-pay | Admitting: Family Medicine

## 2019-08-12 VITALS — BP 124/62 | HR 88 | Temp 97.6°F | Wt 257.2 lb

## 2019-08-12 DIAGNOSIS — M542 Cervicalgia: Secondary | ICD-10-CM | POA: Diagnosis not present

## 2019-08-12 DIAGNOSIS — M79602 Pain in left arm: Secondary | ICD-10-CM | POA: Diagnosis not present

## 2019-08-13 ENCOUNTER — Encounter: Payer: Self-pay | Admitting: Family Medicine
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
## 2019-08-21 ENCOUNTER — Other Ambulatory Visit: Payer: Self-pay | Admitting: Family Medicine

## 2019-08-24 ENCOUNTER — Encounter: Payer: Self-pay | Admitting: Internal Medicine

## 2019-08-24 ENCOUNTER — Other Ambulatory Visit: Payer: Self-pay

## 2019-08-24 ENCOUNTER — Ambulatory Visit (INDEPENDENT_AMBULATORY_CARE_PROVIDER_SITE_OTHER): Payer: Commercial Managed Care - PPO | Admitting: Internal Medicine

## 2019-08-24 VITALS — BP 118/82 | HR 78 | Temp 98.1°F | Ht 64.0 in | Wt 254.6 lb

## 2019-08-24 DIAGNOSIS — E1142 Type 2 diabetes mellitus with diabetic polyneuropathy: Secondary | ICD-10-CM

## 2019-08-24 LAB — POCT GLYCOSYLATED HEMOGLOBIN (HGB A1C): Hemoglobin A1C: 6.6 % — AB (ref 4.0–5.6)

## 2019-08-24 MED ORDER — METFORMIN HCL 1000 MG PO TABS: 500.0000 mg | ORAL_TABLET | Freq: Two times a day (BID) | ORAL | 3 refills | Status: DC

## 2019-08-24 MED ORDER — RYBELSUS 7 MG PO TABS: 7.0000 mg | ORAL_TABLET | Freq: Every day | ORAL | 3 refills | Status: DC

## 2019-08-24 MED ORDER — GLIPIZIDE 5 MG PO TABS: 5.0000 mg | ORAL_TABLET | Freq: Two times a day (BID) | ORAL | 3 refills | Status: DC

## 2019-08-24 NOTE — Progress Notes (Signed)
Name: Morgan [name]  Age/ Sex: 54 y.o., female   MRN/ DOB: [id number], 30-Nov-1965     PCP: Laurey Morale, MD   Reason for Endocrinology Evaluation: Type 2 Diabetes Mellitus  Initial Endocrine Consultative Visit: 02/16/2019    PATIENT IDENTIFIER: [name] is a 54 y.o. female with a past medical history of HTN, OSA,T2DM,and obesity, Ovarian cancer(S/P surgery )  and melanoma. The patient has followed with Endocrinology clinic since 02/16/2019 for consultative assistance with management of her diabetes.  DIABETIC HISTORY:  [name] was diagnosed with T2DM in 2000. She has been on Metformin, Glipizide and Actos since her diagnosis. Her hemoglobin A1c has ranged from 6.8% in 2018, peaking at 8.3% in 2020.  Rybelsus added in 01/2019 Pioglitazone stopped 05/2019  SUBJECTIVE:   During the last visit (05/20/19): A1c 5.8%.  We are continued Metformin , Rybelsus, decrease glipizide and stop pioglitazone pioglitazone.  Today (08/24/2019): [name] is here for a 3 month follow up on diabetes management.   She checks her blood sugars 2 times daily, preprandial to breakfast and bedtime. The patient has not had hypoglycemic episodes since the last clinic visit. Otherwise, the patient has not required any recent emergency interventions for hypoglycemia and has not had recent hospitalizations secondary to hyper or hypoglycemic episodes.    ROS: As per HPI and as detailed below: Review of Systems  Constitutional: Negative for chills and fever.  HENT: Negative for congestion and sore throat.   Respiratory: Negative for cough and shortness of breath.   Cardiovascular: Negative for chest pain and palpitations.  Gastrointestinal: Negative for diarrhea and nausea.      HOME DIABETES REGIMEN:  - Metformin half a tablet twice a day  - Glipizide 5 mg,1 tablet twice a day - Rybelsus 7 mg daily    Glucose Log : BG 105-235 mg/dL   HISTORY:  Past Medical History:    Past Medical History:  Diagnosis Date  . Anal fissure    saw Dr. Mercy Riding   . Anxiety   . Arthritis   . COPD (chronic obstructive pulmonary disease) ([place])   . Depression   . Diabetes mellitus   . GERD (gastroesophageal reflux disease)   . History of ovarian cancer in adulthood   . Hyperlipidemia   . Hypertension   . Ovarian cancer ([place])   . Skin cancer (melanoma) ([place])   . Sleep apnea    uses CPAP   . Tubular adenoma of colon 01/2014   Past Surgical History:  Past Surgical History:  Procedure Laterality Date  . ABDOMINAL HYSTERECTOMY  1986   BSO  . BASAL CELL CARCINOMA EXCISION  10-09-12   lower lip per Dr. Link Snuffer   . COLONOSCOPY  02-17-14   per Dr. Olevia Perches, adenomatous polyps, repeat in 5 yrs   . GLBS tumor ovary 1986    . HEMORRHOID SURGERY    . HIP FRACTURE SURGERY    . MELANOMA EXCISION  1997   upper chest     Social History:  reports that she has never smoked. She has never used smokeless tobacco. She reports that she does not drink alcohol or use drugs. Family History:  Family History  Problem Relation Age of Onset  . Ovarian cancer Sister   . Asthma Mother   . Arthritis Other   . Diabetes Other   . Prostate cancer Other   . Breast cancer Other   . Coronary artery disease Other   . Colon cancer Paternal  Grandfather   . Pancreatic cancer Neg Hx   . Stomach cancer Neg Hx   . Esophageal cancer Neg Hx   . Rectal cancer Neg Hx   . Colon polyps Neg Hx      HOME MEDICATIONS: Allergies as of 08/24/2019      Reactions   Lisinopril Cough      Medication List       Accurate as of August 24, 2019  4:34 PM. If you have any questions, ask your nurse or doctor.        atorvastatin 80 MG tablet Commonly known as: LIPITOR TAKE ONE TABLET BY MOUTH DAILY   diclofenac 75 MG EC tablet Commonly known as: VOLTAREN Take 1 tablet (75 mg total) by mouth 2 (two) times daily.   glipiZIDE 5 MG tablet Commonly known as: GLUCOTROL Take 1 tablet (5 mg  total) by mouth 2 (two) times daily before a meal.   losartan-hydrochlorothiazide 50-12.5 MG tablet Commonly known as: HYZAAR TAKE ONE TABLET BY MOUTH DAILY   metFORMIN 1000 MG tablet Commonly known as: GLUCOPHAGE Take 0.5 tablets (500 mg total) by mouth 2 (two) times daily with a meal. What changed: See the new instructions. Changed by: Dorita Sciara, MD   metoprolol tartrate 50 MG tablet Commonly known as: LOPRESSOR TAKE 1 TABLET BY MOUTH TWICE A DAY   omeprazole 40 MG capsule Commonly known as: PRILOSEC TAKE 1 CAPSULE BY MOUTH TWO TIMES A DAY   ONE TOUCH LANCETS Misc Check blood sugar 1-2 daily for OneTouch Ultra Mini system Dx code: E11.9   ONE TOUCH ULTRA MINI w/Device Kit USE AS DIRECTED TO CHECK BLOOD SUGAR 1-2 TIMES DAILY   OneTouch Ultra test strip Generic drug: glucose blood TEST 1-2 TIMES A DAY   Rybelsus 7 MG Tabs Generic drug: Semaglutide Take 7 mg by mouth daily before breakfast.   sertraline 100 MG tablet Commonly known as: ZOLOFT Take 1 tablet (100 mg total) by mouth daily.   traMADol 50 MG tablet Commonly known as: ULTRAM Take 2 tablets (100 mg total) by mouth every 6 (six) hours as needed for moderate pain.        OBJECTIVE:   Vital Signs: BP 118/82 (BP Location: Right Arm, Patient Position: Sitting, Cuff Size: Large)   Pulse 78   Temp 98.1 F (36.7 C)   Ht '5\' 4"'  (1.626 m)   Wt 254 lb 9.6 oz (115.5 kg)   LMP 03/22/1986   SpO2 96%   BMI 43.70 kg/m   Wt Readings from Last 3 Encounters:  08/24/19 254 lb 9.6 oz (115.5 kg)  08/12/19 257 lb 3.2 oz (116.7 kg)  07/21/19 263 lb 9.6 oz (119.6 kg)     Exam: General: Pt appears well and is in NAD  Lungs: Clear with good BS bilat with no rales, rhonchi, or wheezes  Heart: RRR with normal S1 and S2 and no gallops; no murmurs; no rub  Abdomen: Normoactive bowel sounds, soft, nontender, without masses or organomegaly palpable  Extremities: No pretibial edema.  Skin: Normal texture and  temperature to palpation.  Neuro: MS is good with appropriate affect, pt is alert and Ox3      DM Foot exam 08/24/2019 The skin of the feet is intact without sores or ulcerations. The pedal pulses are 2+ on right and 2+ on left. The sensation is absent at the toes to a screening 5.07, 10 gram monofilament bilaterally    DATA REVIEWED:  Lab Results  Component Value Date  HGBA1C 6.6 (A) 08/24/2019   HGBA1C 5.8 (A) 05/20/2019   HGBA1C 8.3 (H) 01/25/2019   Lab Results  Component Value Date   MICROALBUR 2.9 (H) 11/15/2009   LDLCALC 97 11/28/2015   CREATININE 0.77 10/22/2016   Lab Results  Component Value Date   MICRALBCREAT 1.5 11/15/2009     Lab Results  Component Value Date   CHOL 165 01/25/2019   HDL 37.50 (L) 01/25/2019   LDLCALC 97 11/28/2015   LDLDIRECT 90.0 01/25/2019   TRIG 254.0 (H) 01/25/2019   CHOLHDL 4 01/25/2019         ASSESSMENT / PLAN / RECOMMENDATIONS:   1) Type 2 Diabetes Mellitus, Poorly Controlled, With Neuropathic  complications - Most recent A1c of 6.6 %. Goal A1c < 7.0 %.    -Her diabetes control is currently optimal with an A1c of 6.6% without hypoglycemia -No changes will be made today -Patient instructed to inspect her feet daily due to loss of monofilament sensation at the toes.  We did discuss the importance of continued glycemic control to slow down or stop the progression of peripheral neuropathy.  MEDICATIONS:  - Glipizide 5 mg before Breakfast and Supper - Continue Metformin 1000 mg  half a tablet twice a day  - Continue Rybelsus 7 mg daily    EDUCATION / INSTRUCTIONS:  BG monitoring instructions: Patient is instructed to check her blood sugars 3 times a week, fasting.  Call [facility] clinic if: BG persistently < 70 or > 300. . I reviewed the Rule of 15 for the treatment of hypoglycemia in detail with the patient. Literature supplied.      F/U in 6 months    Signed electronically by: Mack Guise, MD  [facility] Endocrinology  [facility] [address] [place], [place] [address] Phone: [phone] FAX: [phone]   CC: Laurey Morale, [place] [address] Phone: [phone]  Fax: [phone]  Return to Endocrinology clinic as below: Future Appointments  Date Time Provider [place]  02/22/2020  3:40 PM Donasia Wimes, Melanie Crazier, MD [id number] None

## 2019-08-24 NOTE — Patient Instructions (Addendum)
-   Continue Glipizide 5 mg, 1 tablet before Breakfast and Supper - Continue Metformin half a tablet twice a day  - Continue Rybelsus 7 mg daily     HOW TO TREAT LOW BLOOD SUGARS (Blood sugar LESS THAN 70 MG/DL)  Please follow the RULE OF 15 for the treatment of hypoglycemia treatment (when your (blood sugars are less than 70 mg/dL)    STEP 1: Take 15 grams of carbohydrates when your blood sugar is low, which includes:   3-4 GLUCOSE TABS  OR  3-4 OZ OF JUICE OR REGULAR SODA OR  ONE TUBE OF GLUCOSE GEL     STEP 2: RECHECK blood sugar in 15 MINUTES STEP 3: If your blood sugar is still low at the 15 minute recheck --> then, go back to STEP 1 and treat AGAIN with another 15 grams of carbohydrates.

## 2019-08-25 ENCOUNTER — Encounter: Payer: Self-pay | Admitting: Internal Medicine

## 2019-08-25 DIAGNOSIS — E1142 Type 2 diabetes mellitus with diabetic polyneuropathy: Secondary | ICD-10-CM | POA: Insufficient documentation

## 2019-08-26 ENCOUNTER — Telehealth: Payer: Self-pay | Admitting: Internal Medicine

## 2019-08-26 NOTE — Telephone Encounter (Signed)
Sorry missed that. Not sure if she may have a thrombosed hemorrhoid or not. She will need to come in for an exam if pain worsens and not getting better. Yes otherwise I would try 1% hydrocortisone cream OTC. She can use that as needed, when able to can place a pea sized amount and administer PR

## 2019-08-26 NOTE — Telephone Encounter (Signed)
Called patient and let her know Dr. Havery Moros is doing procedures all day today, so I have not heard any additional recommendations from what Dr. Carlean Purl had told her last night. She has only taken 1 dose of Miralax the last few days and states her stool is hard. I asked her to take 3 doses of Miralax daily until she has a soft formed stool, and then back off to prn. I let her know I will call her as soon as I hear anything more

## 2019-08-26 NOTE — Telephone Encounter (Signed)
Patient states her rectum is too tender to put in a suppository. Wants to know if there is something she can put on the outside.

## 2019-08-26 NOTE — Telephone Encounter (Signed)
Agree with the Miralax and using Sitz baths. We can give her some Anusol suppositories, once daily for 7 day and then use as needed. If no improvement and symptoms persist she will need to be seen. Thanks

## 2019-08-26 NOTE — Telephone Encounter (Signed)
Called patient and let her know Dr. Havery Moros would like her to try O.T.C.  1% Hydrocortisone Cream-apply a pea sized amount to the rectum prn. Also that if the pain gets worse, she needs to let us know right away

## 2019-08-26 NOTE — Telephone Encounter (Signed)
External hemorrhoids bothering her and so is constipation Bleedinfg, swelling Does not sound like thrombosis  She is using sitz bath, Miralax, stool softener Says too painful to do a suppository  Advised we would call after we open with advice

## 2019-08-26 NOTE — Telephone Encounter (Signed)
Pt called again regarding her situation. She is requesting  A call back before the end of the day.

## 2019-09-01 ENCOUNTER — Other Ambulatory Visit: Payer: Self-pay | Admitting: Family Medicine

## 2019-09-12 ENCOUNTER — Other Ambulatory Visit: Payer: Self-pay | Admitting: Family Medicine

## 2019-09-22 ENCOUNTER — Ambulatory Visit: Payer: Commercial Managed Care - PPO | Attending: Internal Medicine

## 2019-09-22 DIAGNOSIS — Z23 Encounter for immunization: Secondary | ICD-10-CM

## 2019-09-22 NOTE — Progress Notes (Signed)
   Covid-19 Vaccination Clinic  Name:  Morgan [name]    MRN: [id number] DOB: 07/16/1965  09/22/2019  [name] was observed post Covid-19 immunization for 15 minutes without incident. She was provided with Vaccine Information Sheet and instruction to access the V-Safe system.   [name] was instructed to call 911 with any severe reactions post vaccine: Marland Kitchen Difficulty breathing  . Swelling of face and throat  . A fast heartbeat  . A bad rash all over body  . Dizziness and weakness   Immunizations Administered    Name Date Dose VIS Date Route   Moderna COVID-19 Vaccine 09/22/2019  1:07 PM 0.5 mL 05/31/2019 Intramuscular   Manufacturer: Moderna   Lot: [id number]   [id number]

## 2019-09-30 ENCOUNTER — Other Ambulatory Visit: Payer: Self-pay | Admitting: Family Medicine

## 2019-10-03 ENCOUNTER — Other Ambulatory Visit: Payer: Self-pay | Admitting: Family Medicine

## 2019-10-04 ENCOUNTER — Encounter: Payer: Self-pay | Admitting: Family Medicine

## 2019-10-04 MED ORDER — ATORVASTATIN CALCIUM 80 MG PO TABS: 80.0000 mg | ORAL_TABLET | Freq: Every day | ORAL | 5 refills | Status: DC

## 2019-10-13 ENCOUNTER — Telehealth: Payer: Self-pay | Admitting: Gastroenterology

## 2019-10-13 NOTE — Telephone Encounter (Signed)
Pt reported that she has an external hemorrhoid and is experiencing severe rectal bleeding.  She stated that she has been using wipes and cream as previously advised.

## 2019-10-13 NOTE — Telephone Encounter (Signed)
Okay to send in [place]?

## 2019-10-13 NOTE — Telephone Encounter (Signed)
Yes that's fine. Thanks  

## 2019-10-14 MED ORDER — HYDROCORTISONE ACE-PRAMOXINE 1-1 % EX CREA: 1.0000 "application " | TOPICAL_CREAM | Freq: Two times a day (BID) | CUTANEOUS | 1 refills | Status: DC

## 2019-10-14 NOTE — Telephone Encounter (Signed)
analpram not covered sent in the generic form.  She is notified of the new rx.

## 2019-10-15 ENCOUNTER — Other Ambulatory Visit: Payer: Self-pay | Admitting: Family Medicine

## 2019-10-17 ENCOUNTER — Emergency Department (HOSPITAL_BASED_OUTPATIENT_CLINIC_OR_DEPARTMENT_OTHER)
Admission: EM | Admit: 2019-10-17 | Discharge: 2019-10-17 | Disposition: A | Discharge status: Home / Self Care | Payer: Commercial Managed Care - PPO | Attending: Emergency Medicine | Admitting: Emergency Medicine

## 2019-10-17 ENCOUNTER — Encounter (HOSPITAL_BASED_OUTPATIENT_CLINIC_OR_DEPARTMENT_OTHER): Payer: Self-pay

## 2019-10-17 ENCOUNTER — Other Ambulatory Visit: Payer: Self-pay

## 2019-10-17 DIAGNOSIS — Z85828 Personal history of other malignant neoplasm of skin: Secondary | ICD-10-CM | POA: Diagnosis not present

## 2019-10-17 DIAGNOSIS — Z8543 Personal history of malignant neoplasm of ovary: Secondary | ICD-10-CM | POA: Diagnosis not present

## 2019-10-17 DIAGNOSIS — K625 Hemorrhage of anus and rectum: Secondary | ICD-10-CM

## 2019-10-17 DIAGNOSIS — K649 Unspecified hemorrhoids: Secondary | ICD-10-CM | POA: Diagnosis not present

## 2019-10-17 DIAGNOSIS — Z7984 Long term (current) use of oral hypoglycemic drugs: Secondary | ICD-10-CM | POA: Diagnosis not present

## 2019-10-17 DIAGNOSIS — I10 Essential (primary) hypertension: Secondary | ICD-10-CM | POA: Diagnosis not present

## 2019-10-17 DIAGNOSIS — E876 Hypokalemia: Secondary | ICD-10-CM | POA: Diagnosis not present

## 2019-10-17 DIAGNOSIS — J449 Chronic obstructive pulmonary disease, unspecified: Secondary | ICD-10-CM | POA: Insufficient documentation

## 2019-10-17 DIAGNOSIS — Z79899 Other long term (current) drug therapy: Secondary | ICD-10-CM | POA: Diagnosis not present

## 2019-10-17 DIAGNOSIS — E1140 Type 2 diabetes mellitus with diabetic neuropathy, unspecified: Secondary | ICD-10-CM | POA: Insufficient documentation

## 2019-10-17 DIAGNOSIS — K6289 Other specified diseases of anus and rectum: Secondary | ICD-10-CM | POA: Diagnosis present

## 2019-10-17 LAB — CBC WITH DIFFERENTIAL/PLATELET
Abs Immature Granulocytes: 0.06 10*3/uL (ref 0.00–0.07)
Basophils Absolute: 0 10*3/uL (ref 0.0–0.1)
Basophils Relative: 0 %
Eosinophils Absolute: 0.1 10*3/uL (ref 0.0–0.5)
Eosinophils Relative: 1 %
HCT: 37.7 % (ref 36.0–46.0)
Hemoglobin: 11.9 g/dL — ABNORMAL LOW (ref 12.0–15.0)
Immature Granulocytes: 1 %
Lymphocytes Relative: 26 %
Lymphs Abs: 2.5 10*3/uL (ref 0.7–4.0)
MCH: 28.7 pg (ref 26.0–34.0)
MCHC: 31.6 g/dL (ref 30.0–36.0)
MCV: 90.8 fL (ref 80.0–100.0)
Monocytes Absolute: 0.6 10*3/uL (ref 0.1–1.0)
Monocytes Relative: 6 %
Neutro Abs: 6.4 10*3/uL (ref 1.7–7.7)
Neutrophils Relative %: 66 %
Platelets: 333 10*3/uL (ref 150–400)
RBC: 4.15 MIL/uL (ref 3.87–5.11)
RDW: 15.4 % (ref 11.5–15.5)
WBC: 9.6 10*3/uL (ref 4.0–10.5)
nRBC: 0 % (ref 0.0–0.2)

## 2019-10-17 LAB — COMPREHENSIVE METABOLIC PANEL
ALT: 30 U/L (ref 0–44)
AST: 33 U/L (ref 15–41)
Albumin: 3.6 g/dL (ref 3.5–5.0)
Alkaline Phosphatase: 60 U/L (ref 38–126)
Anion gap: 13 (ref 5–15)
BUN: 14 mg/dL (ref 6–20)
CO2: 25 mmol/L (ref 22–32)
Calcium: 9.3 mg/dL (ref 8.9–10.3)
Chloride: 103 mmol/L (ref 98–111)
Creatinine, Ser: 0.77 mg/dL (ref 0.44–1.00)
GFR calc Af Amer: >60 mL/min (ref >60)
GFR calc non Af Amer: >60 mL/min (ref >60)
Glucose, Bld: 124 mg/dL — ABNORMAL HIGH (ref 70–99)
Potassium: 3.3 mmol/L — ABNORMAL LOW (ref 3.5–5.1)
Sodium: 141 mmol/L (ref 135–145)
Total Bilirubin: 0.5 mg/dL (ref 0.3–1.2)
Total Protein: 6.8 g/dL (ref 6.5–8.1)

## 2019-10-17 LAB — OCCULT BLOOD X 1 CARD TO LAB, STOOL: Fecal Occult Bld: POSITIVE — AB

## 2019-10-17 MED ORDER — POTASSIUM CHLORIDE ER 10 MEQ PO TBCR: 10.0000 meq | EXTENDED_RELEASE_TABLET | Freq: Every day | ORAL | 0 refills | Status: DC

## 2019-10-17 MED ORDER — LIDOCAINE HCL URETHRAL/MUCOSAL 2 % EX GEL
1.0000 "application " | Freq: Once | CUTANEOUS | Status: AC
  Administered 2019-10-17: 1 via TOPICAL
  Filled 2019-10-17: qty 20

## 2019-10-17 MED ORDER — IBUPROFEN 400 MG PO TABS
600.0000 mg | ORAL_TABLET | Freq: Once | ORAL | Status: AC
  Administered 2019-10-17: 600 mg via ORAL
  Filled 2019-10-17: qty 1

## 2019-10-17 MED ORDER — HYDROCORTISONE (PERIANAL) 2.5 % EX CREA: 1.0000 "application " | TOPICAL_CREAM | Freq: Two times a day (BID) | CUTANEOUS | 0 refills | Status: DC

## 2019-10-17 NOTE — ED Triage Notes (Signed)
Pt states rectal pain since Thursday.  Unable to see GI until this coming Thursday.  Has been using external creams and stool softeners without relief.  Has hx of same.

## 2019-10-17 NOTE — ED Notes (Signed)
ED Provider at bedside. 

## 2019-10-17 NOTE — Discharge Instructions (Addendum)
Take potassium as directed for your low potassium. Please have this level rechecked by your doctor.   Please use the anusol cream as directed to help with your hemorrhoids. You can also take tylenol and motrin for your symptoms.   Keep your appointment with your gastroenterologist later this week.   Return to the emergency department for new or worsening symptoms including heavy rectal bleeding, lightheadedness, dizziness, fevers, or other concerns.

## 2019-10-17 NOTE — ED Triage Notes (Signed)
Pt reports visible blood at times with bowel movement.  Hx of Hemorrhoids

## 2019-10-17 NOTE — ED Provider Notes (Signed)
[facility] EMERGENCY DEPARTMENT Provider Note   CSN: [id number] Arrival date & time: 10/17/19  1034     History Chief Complaint  Patient presents with  . Rectal Pain    Morgan [name] is a 54 y.o. female.  HPI   Patient is a 54 year old female with history of anal fissures, anxiety, arthritis, COPD, depression, diabetes, GERD, hyperlipidemia, hypertension, ovarian cancer, sleep apnea, who presents to the emergency department today for evaluation of rectal pain.  States she started having rectal pain about 5 days ago.  She called her GI doctor and was given medication for hemorrhoids.  She is also been taking stool softeners and doing sitz bath at home.  She denies any relief from her symptoms.  Pain is constant and severe in nature.  She also notes that she has been having bright red blood in her stools.  It is mostly on the toilet paper when she wipes but it has also been noted in the commode as well. States that blood on tissue paper is small and is about the size of the nail on her pinky finger. No melena noted. She denies any abdominal pain or other systemic symptoms at this time.  She has an appointment with her GI doctor later this week.  Past Medical History:  Diagnosis Date  . Anal fissure    saw Dr. Mercy Riding   . Anxiety   . Arthritis   . COPD (chronic obstructive pulmonary disease) ([place])   . Depression   . Diabetes mellitus   . GERD (gastroesophageal reflux disease)   . History of ovarian cancer in adulthood   . Hyperlipidemia   . Hypertension   . Ovarian cancer ([place])   . Skin cancer (melanoma) ([place])   . Sleep apnea    uses CPAP   . Tubular adenoma of colon 01/2014    Patient Active Problem List   Diagnosis Date Noted  . Type 2 diabetes mellitus with diabetic polyneuropathy, without long-term current use of insulin ([place]) 08/25/2019  . Type 2 diabetes mellitus with hyperglycemia, without long-term current use of insulin ([place]) 02/17/2019    . Anxiety and depression 02/17/2019  . BMI 45.0-49.9, adult ([place]) 02/04/2019  . Anxiety disorder 10/13/2018  . Ringing in ears, bilateral 12/29/2017  . OSA on CPAP 01/06/2017  . Witnessed episode of apnea 07/03/2016  . Occupational asthma 12/25/2015  . Diverticulitis of colon 11/30/2014  . ADHD (attention deficit hyperactivity disorder) 09/09/2013  . IBS (irritable bowel syndrome) 04/21/2013  . Chronic cough 11/26/2011  . External hemorrhoids 04/04/2010  . UNSPECIFIED THROMBOSED HEMORRHOIDS 01/08/2009  . GERD 08/23/2008  . Diabetes mellitus without complication ([place]) 57/32/2025  . Hyperlipidemia 01/22/2007  . Essential hypertension 01/22/2007    Past Surgical History:  Procedure Laterality Date  . ABDOMINAL HYSTERECTOMY  1986   BSO  . BASAL CELL CARCINOMA EXCISION  10-09-12   lower lip per Dr. Link Snuffer   . COLONOSCOPY  02-17-14   per Dr. Olevia Perches, adenomatous polyps, repeat in 5 yrs   . GLBS tumor ovary 1986    . HEMORRHOID SURGERY    . HIP FRACTURE SURGERY    . MELANOMA EXCISION  1997   upper chest      OB History   No obstetric history on file.     Family History  Problem Relation Age of Onset  . Ovarian cancer Sister   . Asthma Mother   . Arthritis Other   . Diabetes Other   . Prostate cancer  Other   . Breast cancer Other   . Coronary artery disease Other   . Colon cancer Paternal Grandfather   . Pancreatic cancer Neg Hx   . Stomach cancer Neg Hx   . Esophageal cancer Neg Hx   . Rectal cancer Neg Hx   . Colon polyps Neg Hx     Social History   Tobacco Use  . Smoking status: Never Smoker  . Smokeless tobacco: Never Used  Substance Use Topics  . Alcohol use: No    Alcohol/week: 0.0 standard drinks  . Drug use: No    Home Medications Prior to Admission medications   Medication Sig Start Date End Date Taking? Authorizing Provider  atorvastatin (LIPITOR) 80 MG tablet Take 1 tablet (80 mg total) by mouth daily. 10/04/19   Laurey Morale, MD  Blood  Glucose Monitoring Suppl (ONE TOUCH ULTRA MINI) w/Device KIT USE AS DIRECTED TO CHECK BLOOD SUGAR 1-2 TIMES DAILY 03/08/19   Laurey Morale, MD  diclofenac (VOLTAREN) 75 MG EC tablet Take 1 tablet (75 mg total) by mouth 2 (two) times daily. 07/21/19   Laurey Morale, MD  glipiZIDE (GLUCOTROL) 5 MG tablet Take 1 tablet (5 mg total) by mouth 2 (two) times daily before a meal. 08/24/19   Shamleffer, Melanie Crazier, MD  glucose blood (ONETOUCH ULTRA) test strip TEST 1-2 TIMES A DAY 02/21/19   Laurey Morale, MD  hydrocortisone (ANUSOL-HC) 2.5 % rectal cream Place 1 application rectally 2 (two) times daily. 10/17/19   Nisa Decaire S, PA-C  losartan-hydrochlorothiazide (HYZAAR) 50-12.5 MG tablet TAKE ONE TABLET BY MOUTH DAILY 10/17/19   Laurey Morale, MD  metFORMIN (GLUCOPHAGE) 1000 MG tablet Take 0.5 tablets (500 mg total) by mouth 2 (two) times daily with a meal. 08/24/19   Shamleffer, Melanie Crazier, MD  metoprolol tartrate (LOPRESSOR) 50 MG tablet TAKE ONE TABLET BY MOUTH TWICE A DAY 10/17/19   Laurey Morale, MD  omeprazole (PRILOSEC) 40 MG capsule TAKE 1 CAPSULE BY MOUTH TWO TIMES A DAY 08/11/19   Armbruster, [name], MD  ONE [facility] LANCETS MISC Check blood sugar 1-2 daily for OneTouch Ultra Mini system Dx code: E11.9 12/02/17   Laurey Morale, MD  potassium chloride (KLOR-CON) 10 MEQ tablet Take 1 tablet (10 mEq total) by mouth daily for 4 days. 10/17/19 10/21/19  Berman Grainger S, PA-C  pramoxine-hydrocortisone (PROCTOCREAM-HC) 1-1 % rectal cream Place 1 application rectally 2 (two) times daily. 10/14/19   Armbruster, [name], MD  Semaglutide (RYBELSUS) 7 MG TABS Take 7 mg by mouth daily before breakfast. 08/24/19   Shamleffer, Melanie Crazier, MD  sertraline (ZOLOFT) 100 MG tablet Take 1 tablet (100 mg total) by mouth daily. 07/21/19   Laurey Morale, MD  traMADol (ULTRAM) 50 MG tablet Take 2 tablets (100 mg total) by mouth every 6 (six) hours as needed for moderate pain. 07/26/19   Laurey Morale, MD     Allergies    Lisinopril  Review of Systems   Review of Systems  Constitutional: Negative for fever.  HENT: Negative for sore throat.   Eyes: Negative for visual disturbance.  Respiratory: Negative for cough and shortness of breath.   Cardiovascular: Negative for chest pain.  Gastrointestinal: Positive for blood in stool. Negative for abdominal pain, anal bleeding, diarrhea, nausea and vomiting.       Rectal pain  Genitourinary: Negative for dysuria and hematuria.  Musculoskeletal: Negative for myalgias.  Skin: Negative for rash.  Neurological: Negative for  dizziness and light-headedness.  All other systems reviewed and are negative.   Physical Exam Updated Vital Signs BP 111/70 (BP Location: Right Arm)   Pulse 73   Temp 98.2 F (36.8 C) (Oral)   Resp 16   Ht '5\' 4"'  (1.626 m)   Wt 113.4 kg   LMP 03/22/1986   SpO2 94%   BMI 42.91 kg/m   Physical Exam Vitals and nursing note reviewed.  Constitutional:      General: She is not in acute distress.    Appearance: She is well-developed.  HENT:     Head: Normocephalic and atraumatic.  Eyes:     Conjunctiva/sclera: Conjunctivae normal.  Cardiovascular:     Rate and Rhythm: Normal rate and regular rhythm.  Pulmonary:     Effort: Pulmonary effort is normal.     Breath sounds: Normal breath sounds.  Abdominal:     General: Bowel sounds are normal.     Palpations: Abdomen is soft.     Tenderness: There is no guarding.  Genitourinary:    Comments: Chaperone present. External hemorrhoids noted. No obvious anal fissures. TTP with DRE. Possible internal hemorrhoids also noted. No gross blood on exam and no melena. Stool was light brown.  Musculoskeletal:        General: Normal range of motion.     Cervical back: Neck supple.  Skin:    General: Skin is warm and dry.  Neurological:     Mental Status: She is alert.     ED Results / Procedures / Treatments   Labs (all labs ordered are listed, but only abnormal results  are displayed) Labs Reviewed  CBC WITH DIFFERENTIAL/PLATELET - Abnormal; Notable for the following components:      Result Value   Hemoglobin 11.9 (*)    All other components within normal limits  COMPREHENSIVE METABOLIC PANEL - Abnormal; Notable for the following components:   Potassium 3.3 (*)    Glucose, Bld 124 (*)    All other components within normal limits  OCCULT BLOOD X 1 CARD TO LAB, STOOL - Abnormal; Notable for the following components:   Fecal Occult Bld POSITIVE (*)    All other components within normal limits    EKG None  Radiology No results found.  Procedures Procedures (including critical care time)  Medications Ordered in ED Medications  lidocaine (XYLOCAINE) 2 % jelly 1 application (1 application Topical Given 10/17/19 1136)  ibuprofen (ADVIL) tablet 600 mg (600 mg Oral Given 10/17/19 1137)    ED Course  I have reviewed the triage vital signs and the nursing notes.  Pertinent labs & imaging results that were available during my care of the patient were reviewed by me and considered in my medical decision making (see chart for details).    MDM Rules/Calculators/A&P                      54 y/o F with a h/o hemorrhoids and anal fissures presenting with rectal pain and bloody stools.   hemodyamically stable in the Ed. No abd TTP. Rectal exam consistent with external and likely internal hemorrhoids. No gross blood or melena on DRE.   CBC with very mild anemia, otherwise reassuring CMP with mild hypokalemia, otherwise reassuring   - will give short course of po supplementation  Hemoccult is positive  - no melena on exam. No bright red blood. Suspect uncomplicated bleeding from hemorrhoids. Do not feel she needs admission or further ED w/u for bleeding. She  has outpt appt with GI in a few days.   Pt given lidocaine jelly and NSAIDs for pain. On reassessment, she states that her pain is improved. We discussed the lab findings and likely diagnosis of  hemorrhoidal bleeding. Will give a different medication for her hemorrhoids. Have advised that she keep her appt with her GI doctor this week. Have advised that if bleeding worsens she should return to the ED immediately. She voices understanding and states she is comfortable with the plan. All questions answered, pt stable for d/c.    Final Clinical Impression(s) / ED Diagnoses Final diagnoses:  Hemorrhoids, unspecified hemorrhoid type  Bright red blood per rectum  Hypokalemia    Rx / DC Orders ED Discharge Orders         Ordered    hydrocortisone (ANUSOL-HC) 2.5 % rectal cream  2 times daily     10/17/19 1239    potassium chloride (KLOR-CON) 10 MEQ tablet  Daily     10/17/19 [address], Eshani Maestre S, PA-C 10/17/19 1337    Hayden Rasmussen, MD 10/17/19 [phone]

## 2019-10-18 ENCOUNTER — Other Ambulatory Visit: Payer: Self-pay | Admitting: Internal Medicine

## 2019-10-20 ENCOUNTER — Encounter: Payer: Self-pay | Admitting: Nurse Practitioner

## 2019-10-20 ENCOUNTER — Encounter: Payer: Self-pay | Admitting: Internal Medicine

## 2019-10-20 ENCOUNTER — Ambulatory Visit: Payer: Commercial Managed Care - PPO | Attending: Internal Medicine

## 2019-10-20 ENCOUNTER — Ambulatory Visit: Payer: Commercial Managed Care - PPO | Admitting: Nurse Practitioner

## 2019-10-20 VITALS — BP 108/70 | HR 80 | Temp 98.0°F | Ht 64.5 in | Wt 245.5 lb

## 2019-10-20 DIAGNOSIS — K5900 Constipation, unspecified: Secondary | ICD-10-CM | POA: Diagnosis not present

## 2019-10-20 DIAGNOSIS — Z23 Encounter for immunization: Secondary | ICD-10-CM

## 2019-10-20 DIAGNOSIS — K644 Residual hemorrhoidal skin tags: Secondary | ICD-10-CM | POA: Diagnosis not present

## 2019-10-20 NOTE — Progress Notes (Signed)
Agree with assessment and plan as outlined.  

## 2019-10-20 NOTE — Progress Notes (Signed)
IMPRESSION and PLAN:    54 year old female with PMH significant for arthritis, COPD, anxiety, depression, diabetes, hyperlipidemia, hypertension, ovarian cancer, sleep apnea, GERD, diverticulosis and colon polyps.  #External hemorrhoids.  --Recently in ED. Bleeding and rectal pain resolved with hydrocortisone 2.5% twice daily.   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  Complete remaining portion of hydrocortisone ointment.  Following that, or anytime before, if there is recurrent rectal pain/bleeding then will treat empirically for recurrent anal fissure --She inquires about the numbing ointment applied in ED.  She can have RectiCare on hand at home to use as needed.  --She needs a more aggressive bowel regimen  #Constipation --Stool still hard/straining despite twice daily MiraLAX, stool softeners and high-fiber diet --She needs to increase her fluid intake to 64 ounces a day --Add daily Citrucel --For now increase MiraLAX to 3 times daily     HPI:    Primary GI: Dr. Havery Moros  Chief complaint : Hemorrhoids  Morgan [name] call the office several days with complaints of rectal bleeding.  We prescribed Analpram.  She ended up going to the emergency department a few days later on 10/17/2019 after getting no relief with Analpram, stool softeners and sitz baths.  ED records reviewed. She was having severe rectal pain and ongoing rectal bleeding.  Labs unremarkable except for mild decline in hemoglobin from baseline of around 13 down to 12.9.  ED prescribed hydrocortisone 2.5% twice daily and since using that she has felt much better.  She does mention that they also applied some numbing ointment in the ED which helped.  She is no longer having any bleeding and very little pain.  Despite stool softeners high-fiber diet and twice daily MiraLAX she still struggles with constipation.  Her stools are  hard with straining.  She drinks only two 8 ounce bottles of water a day   Data Reviewed:  10/17/2019 Hemoglobin 11.9, CBC otherwise normal CMP unremarkable  03/23/19 Colonoscopy  Four 3 to 4 mm polyps in the transverse colon, removed with a cold snare. Resected and retrieved. - One 3 mm polyp at the splenic flexure, removed with a cold snare. Resected and retrieved. - Two 3 to 4 mm polyps in the sigmoid colon, removed with a cold snare. Resected and retrieved. - One diminutive polyp in the rectum, removed with a cold snare. Resected and retrieved. - Diverticulosis in the transverse colon and in the left colon. - Internal hemorrhoids. - The examination was otherwise normal.  Review of systems:     No chest pain, no SOB, no fevers, no urinary sx   Past Medical History:  Diagnosis Date  . Anal fissure    saw Dr. Mercy Riding   . Anxiety   . Arthritis   . COPD (chronic obstructive pulmonary disease) ([place])   . Depression   . Diabetes mellitus   . GERD (gastroesophageal reflux disease)   . History of ovarian cancer in adulthood   . Hyperlipidemia   . Hypertension   . Ovarian cancer ([place])   . Skin cancer (melanoma) ([place])   . Sleep apnea    uses CPAP   . Tubular adenoma of colon 01/2014    Patient's surgical history, family medical history, social history, medications and allergies were all reviewed in Epic   Serum creatinine: 0.77 mg/dL 10/17/19 1135 Estimated creatinine clearance: 96.8 mL/min  Current Outpatient  Medications  Medication Sig Dispense Refill  . atorvastatin (LIPITOR) 80 MG tablet Take 1 tablet (80 mg total) by mouth daily. 30 tablet 5  . Blood Glucose Monitoring Suppl (ONE TOUCH ULTRA MINI) w/Device KIT USE AS DIRECTED TO CHECK BLOOD SUGAR 1-2 TIMES DAILY 1 kit 0  . diclofenac (VOLTAREN) 75 MG EC tablet Take 1 tablet (75 mg total) by mouth 2 (two) times daily. 60 tablet 2  . glipiZIDE (GLUCOTROL) 5 MG tablet TAKE 1 TABLET TWICE A DAY BEFORE MEALS 180  tablet 3  . glucose blood (ONETOUCH ULTRA) test strip TEST 1-2 TIMES A DAY 50 strip 2  . hydrocortisone (ANUSOL-HC) 2.5 % rectal cream Place 1 application rectally 2 (two) times daily. 30 g 0  . losartan-hydrochlorothiazide (HYZAAR) 50-12.5 MG tablet TAKE ONE TABLET BY MOUTH DAILY 30 tablet 0  . metFORMIN (GLUCOPHAGE) 1000 MG tablet Take 0.5 tablets (500 mg total) by mouth 2 (two) times daily with a meal. 90 tablet 3  . metoprolol tartrate (LOPRESSOR) 50 MG tablet TAKE ONE TABLET BY MOUTH TWICE A DAY 60 tablet 0  . omeprazole (PRILOSEC) 40 MG capsule TAKE 1 CAPSULE BY MOUTH TWO TIMES A DAY 60 capsule 2  . ONE TOUCH LANCETS MISC Check blood sugar 1-2 daily for OneTouch Ultra Mini system Dx code: E11.9 200 each 3  . potassium chloride (KLOR-CON) 10 MEQ tablet Take 1 tablet (10 mEq total) by mouth daily for 4 days. 4 tablet 0  . Semaglutide (RYBELSUS) 7 MG TABS Take 7 mg by mouth daily before breakfast. 90 tablet 3   No current facility-administered medications for this visit.    Physical Exam:     BP 108/70 (BP Location: Left Arm, Patient Position: Sitting, Cuff Size: Normal)   Pulse 80   Temp 98 F (36.7 C)   Ht 5' 4.5" (1.638 m)   Wt 245 lb 8 oz (111.4 kg)   LMP 03/22/1986   BMI 41.49 kg/m   GENERAL:  Pleasant female in NAD PSYCH: : Cooperative, normal affect CARDIAC:  RRR ABDOMEN:  Nondistended, soft, nontender. No obvious masses, no hepatomegaly,  normal bowel sounds RECTAL: swollen external hemorrhoids. She had significant discomfort on limited DRE NEURO: Alert and oriented x 3, no focal neurologic deficits   Tye Savoy , NP 10/20/2019, 8:32 AM

## 2019-10-20 NOTE — Progress Notes (Signed)
   Covid-19 Vaccination Clinic  Name:  Morgan [name]    MRN: [id number] DOB: 1966-03-13  10/20/2019  [name] was observed post Covid-19 immunization for 15 minutes without incident. She was provided with Vaccine Information Sheet and instruction to access the V-Safe system.   [name] was instructed to call 911 with any severe reactions post vaccine: Marland Kitchen Difficulty breathing  . Swelling of face and throat  . A fast heartbeat  . A bad rash all over body  . Dizziness and weakness   Immunizations Administered    Name Date Dose VIS Date Route   Moderna COVID-19 Vaccine 10/20/2019 12:35 PM 0.5 mL 05/2019 Intramuscular   Manufacturer: Levan Hurst   Lot: [id number]   [id number]

## 2019-10-20 NOTE — Patient Instructions (Addendum)
If you are age 56 or older, your body mass index should be between 23-30. Your Body mass index is 41.49 kg/m. If this is out of the aforementioned range listed, please consider follow up with your Primary Care Provider.  If you are age 65 or younger, your body mass index should be between 19-25. Your Body mass index is 41.49 kg/m. If this is out of the aformentioned range listed, please consider follow up with your Primary Care Provider.   INCREASE Miralax to three times a day.  INCREASE water intake to 64 ounces a day.  ADD daily Citrucel.  CONTINUE  Hydrocortisone cream twice a day until complete.  Use Recticare as needed.  Call the office if you are not doing better in a week.

## 2019-11-01 ENCOUNTER — Encounter: Payer: Self-pay | Admitting: Family Medicine

## 2019-11-01 MED ORDER — ALPRAZOLAM 0.5 MG PO TABS: 0.5000 mg | ORAL_TABLET | Freq: Every evening | ORAL | 2 refills | Status: DC | PRN

## 2019-11-01 MED ORDER — SERTRALINE HCL 100 MG PO TABS: 100.0000 mg | ORAL_TABLET | Freq: Every day | ORAL | 3 refills | Status: DC

## 2019-11-01 NOTE — Telephone Encounter (Signed)
She should stay on Zoloft 100 mg but let's add Xanax to this at bedtime. I sent in a small supply for her to try

## 2019-11-04 ENCOUNTER — Ambulatory Visit (INDEPENDENT_AMBULATORY_CARE_PROVIDER_SITE_OTHER): Payer: Commercial Managed Care - PPO | Admitting: Pulmonary Disease

## 2019-11-04 ENCOUNTER — Encounter: Payer: Self-pay | Admitting: Pulmonary Disease

## 2019-11-04 ENCOUNTER — Other Ambulatory Visit: Payer: Self-pay

## 2019-11-04 VITALS — BP 132/70 | HR 75 | Temp 97.7°F | Ht 64.0 in | Wt 243.6 lb

## 2019-11-04 DIAGNOSIS — G4730 Sleep apnea, unspecified: Secondary | ICD-10-CM

## 2019-11-04 DIAGNOSIS — F5105 Insomnia due to other mental disorder: Secondary | ICD-10-CM

## 2019-11-04 DIAGNOSIS — G4733 Obstructive sleep apnea (adult) (pediatric): Secondary | ICD-10-CM

## 2019-11-04 DIAGNOSIS — E669 Obesity, unspecified: Secondary | ICD-10-CM | POA: Diagnosis not present

## 2019-11-04 NOTE — Patient Instructions (Signed)
Will have your CPAP setting changed to 13 cm water  Call if you are having trouble after CPAP setting changed  Follow up in 1 year

## 2019-11-04 NOTE — Progress Notes (Signed)
Morgan [name], Critical Care, and Sleep Medicine  Chief Complaint  Patient presents with  . Follow-up    pt states leakage in cpap machine. pt states feeling tire doing the day taking xanax    Constitutional:  BP 132/70 (BP Location: Left Arm, Cuff Size: Normal)   Pulse 75   Temp 97.7 F (36.5 C) (Temporal)   Ht '5\' 4"'  (1.626 m)   Wt 243 lb 9.6 oz (110.5 kg)   LMP 03/22/1986   SpO2 94%   BMI 41.81 kg/m   Past Medical History:  Anxiety, Depression, DM, GERD, Ovarian cancer, HLD, HTN, Melanoma, Colon polyp  Summary:  Morgan [name] is a 54 y.o. female obstructive sleep apnea.  Subjective:   Previously seen by Dr. Ashok Cordia.  Had sleep study in 2018.  Severe OSA.  Uses CPAP nightly.  Has noticed trouble with mask leak and going through water in humidifier.  She has been feeling more anxious and depressed.  Wakes up in middle of night and has trouble falling back to sleep.  Prescribed xanax by PCP but hasn't started yet.  Received COVID vaccines.   Physical Exam:   Appearance - well kempt  ENMT - no sinus tenderness, no nasal discharge, no oral exudate, Mallampati 4, enlarged tongue  Respiratory - no wheeze, or rales  CV - regular rate and rhythm, no murmurs  GI - soft, non tender  Lymph - no adenopathy noted in neck  Ext - no edema  Skin - no rashes  Neuro - normal strength, oriented x 3  Psych - normal mood and affect   Assessment/Plan:   Obstructive sleep apnea. - she is compliant with CPAP and reports benefit - will change CPAP from 15 to 13 cm H2O to determine if this improves mask leak  Obesity. - discussed importance of weight loss  Insomnia with anxiety and depression. - prescribed xanax by her PCP  Follow up:  Patient Instructions  Will have your CPAP setting changed to 13 cm water  Call if you are having trouble after CPAP setting changed  Follow up in 1 year   Signature:  Chesley Mires, MD [place] Pager: [phone] 11/04/2019, 10:07 AM  Flow Sheet     [name] tests:  PFT 07/03/16 >> FEV1 2.57 (90%), FEV1% 92, DLCO 91%  Sleep tests:  PSG 12/23/16 >> AHI 112, SpO2 low 55% CPAP 10/04/19 to 11/02/19 >> used on 30 of 30 nights with average 6 hrs 50 min.  Average AHI 0.6 with CPAP 15 cm H2O.  Medications:   Allergies as of 11/04/2019      Reactions   Lisinopril Cough      Medication List       Accurate as of Nov 04, 2019 10:07 AM. If you have any questions, ask your nurse or doctor.        STOP taking these medications   diclofenac 75 MG EC tablet Commonly known as: VOLTAREN Stopped by: Chesley Mires, MD   potassium chloride 10 MEQ tablet Commonly known as: KLOR-CON Stopped by: Chesley Mires, MD     TAKE these medications   ALPRAZolam 0.5 MG tablet Commonly known as: Xanax Take 1 tablet (0.5 mg total) by mouth at bedtime as needed for anxiety.   atorvastatin 80 MG tablet Commonly known as: LIPITOR Take 1 tablet (80 mg total) by mouth daily.   glipiZIDE 5 MG tablet Commonly known as: GLUCOTROL TAKE 1 TABLET TWICE A DAY BEFORE MEALS   hydrocortisone 2.5 %  rectal cream Commonly known as: Anusol-HC Place 1 application rectally 2 (two) times daily.   losartan-hydrochlorothiazide 50-12.5 MG tablet Commonly known as: HYZAAR TAKE ONE TABLET BY MOUTH DAILY   metFORMIN 1000 MG tablet Commonly known as: GLUCOPHAGE Take 0.5 tablets (500 mg total) by mouth 2 (two) times daily with a meal.   metoprolol tartrate 50 MG tablet Commonly known as: LOPRESSOR TAKE ONE TABLET BY MOUTH TWICE A DAY   omeprazole 40 MG capsule Commonly known as: PRILOSEC TAKE 1 CAPSULE BY MOUTH TWO TIMES A DAY   ONE TOUCH LANCETS Misc Check blood sugar 1-2 daily for OneTouch Ultra Mini system Dx code: E11.9   ONE TOUCH ULTRA MINI w/Device Kit USE AS DIRECTED TO CHECK BLOOD SUGAR 1-2 TIMES DAILY   OneTouch Ultra test strip Generic drug: glucose blood TEST 1-2 TIMES A DAY   Rybelsus  7 MG Tabs Generic drug: Semaglutide Take 7 mg by mouth daily before breakfast.   sertraline 100 MG tablet Commonly known as: ZOLOFT Take 1 tablet (100 mg total) by mouth daily.       Past Surgical History:  She  has a past surgical history that includes Melanoma excision (1997); Abdominal hysterectomy (1986); Hemorrhoid surgery; Hip fracture surgery; Excision basal cell carcinoma (10-09-12); GLBS tumor ovary 1986; and Colonoscopy (02-17-14).  Family History:  Her family history includes Arthritis in an other family member; Asthma in her mother; Breast cancer in an other family member; Colon cancer in her paternal grandfather; Coronary artery disease in an other family member; Diabetes in an other family member; Ovarian cancer in her sister; Prostate cancer in an other family member.  Social History:  She  reports that she has never smoked. She has never used smokeless tobacco. She reports that she does not drink alcohol or use drugs.

## 2019-11-12 ENCOUNTER — Other Ambulatory Visit: Payer: Self-pay | Admitting: Gastroenterology

## 2019-11-12 ENCOUNTER — Other Ambulatory Visit: Payer: Self-pay | Admitting: Family Medicine

## 2019-11-30 ENCOUNTER — Other Ambulatory Visit: Payer: Self-pay | Admitting: Family Medicine

## 2019-12-10 ENCOUNTER — Other Ambulatory Visit: Payer: Self-pay | Admitting: Family Medicine

## 2019-12-16 DIAGNOSIS — G4733 Obstructive sleep apnea (adult) (pediatric): Secondary | ICD-10-CM

## 2019-12-16 NOTE — Telephone Encounter (Signed)
Download has been printed and placed in Dr. Halford Chessman look at.  Dr. Halford Chessman - please advise. Thanks.

## 2019-12-19 NOTE — Telephone Encounter (Signed)
mychart message sent by Dr. Halford Chessman to pt was to have her settings increased to 14cm. Dr. Halford Chessman sent this to pt on  6/18. Per [name], Dr. Halford Chessman is on vacation.  Morgan [name], please advise if we can place this under you for pt to have settings changed?

## 2019-12-19 NOTE — Telephone Encounter (Signed)
Yes, that is fine. 

## 2019-12-20 ENCOUNTER — Other Ambulatory Visit: Payer: Self-pay

## 2019-12-20 ENCOUNTER — Encounter: Payer: Self-pay | Admitting: Family Medicine

## 2019-12-20 ENCOUNTER — Telehealth (INDEPENDENT_AMBULATORY_CARE_PROVIDER_SITE_OTHER): Payer: Commercial Managed Care - PPO | Admitting: Family Medicine

## 2019-12-20 DIAGNOSIS — J069 Acute upper respiratory infection, unspecified: Secondary | ICD-10-CM | POA: Diagnosis not present

## 2019-12-20 NOTE — Progress Notes (Signed)
   Subjective:    Patient ID: Morgan [name], female    DOB: Dec 14, 1965, 54 y.o.   MRN: [id number]  HPI Here for 4 days of stuffy head, PND, sneezing, and a dry cough. No fever. Drinking fluids. Virtual Visit via Telephone Note  I connected with the patient on 12/20/19 at  1:00 PM EDT by telephone and verified that I am speaking with the correct person using two identifiers.   I discussed the limitations, risks, security and privacy concerns of performing an evaluation and management service by telephone and the availability of in person appointments. I also discussed with the patient that there may be a patient responsible charge related to this service. The patient expressed understanding and agreed to proceed.  Location patient: home Location provider: work or home office Participants present for the call: patient, provider Patient did not have a visit in the prior 7 days to address this/these issue(s).   History of Present Illness:    Observations/Objective: Patient sounds cheerful and well on the phone. I do not appreciate any SOB. Speech and thought processing are grossly intact. Patient reported vitals:  Assessment and Plan: Viral URI. Try Mucinex and Zyrtec. Recheck prn.  [name], MD   Follow Up Instructions:     [phone] 5-10 [phone] 11-20 [id number] 21-30 I did not refer this patient for an OV in the next 24 hours for this/these issue(s).  I discussed the assessment and treatment plan with the patient. The patient was provided an opportunity to ask questions and all were answered. The patient agreed with the plan and demonstrated an understanding of the instructions.   The patient was advised to call back or seek an in-person evaluation if the symptoms worsen or if the condition fails to improve as anticipated.  I provided 12 minutes of non-face-to-face time during this encounter.   [name], MD   Review of Systems     Objective:   Physical  Exam        Assessment & Plan:

## 2019-12-25 ENCOUNTER — Encounter: Payer: Self-pay | Admitting: Family Medicine

## 2019-12-26 NOTE — Telephone Encounter (Signed)
Please advise 

## 2019-12-27 NOTE — Telephone Encounter (Signed)
By the time she finishes the box of Mucinex, the buzzing should have stopped

## 2020-01-12 ENCOUNTER — Other Ambulatory Visit: Payer: Self-pay | Admitting: Gastroenterology

## 2020-01-12 ENCOUNTER — Other Ambulatory Visit: Payer: Self-pay | Admitting: Family Medicine

## 2020-01-18 ENCOUNTER — Other Ambulatory Visit: Payer: Self-pay | Admitting: Family Medicine

## 2020-01-31 ENCOUNTER — Other Ambulatory Visit: Payer: Self-pay | Admitting: Family Medicine

## 2020-01-31 NOTE — Telephone Encounter (Signed)
Last filled 11/01/2019 Last OV 12/20/2019  Ok to fill?

## 2020-02-13 ENCOUNTER — Encounter: Payer: Self-pay | Admitting: Internal Medicine

## 2020-02-13 ENCOUNTER — Other Ambulatory Visit: Payer: Self-pay | Admitting: Family Medicine

## 2020-02-13 MED ORDER — ONETOUCH ULTRA MINI W/DEVICE KIT: PACK | 0 refills | Status: DC

## 2020-02-16 ENCOUNTER — Other Ambulatory Visit: Payer: Self-pay | Admitting: Family Medicine

## 2020-02-22 ENCOUNTER — Ambulatory Visit (INDEPENDENT_AMBULATORY_CARE_PROVIDER_SITE_OTHER): Payer: Commercial Managed Care - PPO | Admitting: Internal Medicine

## 2020-02-22 ENCOUNTER — Encounter: Payer: Self-pay | Admitting: Internal Medicine

## 2020-02-22 ENCOUNTER — Other Ambulatory Visit: Payer: Self-pay

## 2020-02-22 VITALS — BP 118/78 | HR 73 | Ht 64.0 in | Wt 241.4 lb

## 2020-02-22 DIAGNOSIS — E782 Mixed hyperlipidemia: Secondary | ICD-10-CM | POA: Diagnosis not present

## 2020-02-22 DIAGNOSIS — E1142 Type 2 diabetes mellitus with diabetic polyneuropathy: Secondary | ICD-10-CM

## 2020-02-22 LAB — POCT GLYCOSYLATED HEMOGLOBIN (HGB A1C): Hemoglobin A1C: 6.6 % — AB (ref 4.0–5.6)

## 2020-02-22 LAB — POCT GLUCOSE (DEVICE FOR HOME USE): POC Glucose: 116 mg/dl — AB (ref 70–99)

## 2020-02-22 NOTE — Patient Instructions (Addendum)
-   Keep up the Good Work ! You are doing awesome   - Continue Glipizide 5 mg, 1 tablet before Breakfast and Supper - Continue Metformin half a tablet twice a day  - Continue Rybelsus 7 mg daily     HOW TO TREAT LOW BLOOD SUGARS (Blood sugar LESS THAN 70 MG/DL)  Please follow the RULE OF 15 for the treatment of hypoglycemia treatment (when your (blood sugars are less than 70 mg/dL)    STEP 1: Take 15 grams of carbohydrates when your blood sugar is low, which includes:   3-4 GLUCOSE TABS  OR  3-4 OZ OF JUICE OR REGULAR SODA OR  ONE TUBE OF GLUCOSE GEL     STEP 2: RECHECK blood sugar in 15 MINUTES STEP 3: If your blood sugar is still low at the 15 minute recheck --> then, go back to STEP 1 and treat AGAIN with another 15 grams of carbohydrates.

## 2020-02-22 NOTE — Progress Notes (Signed)
Name: Morgan [name]  Age/ Sex: 54 y.o., female   MRN/ DOB: [id number], Nov 17, 1965     PCP: Laurey Morale, MD   Reason for Endocrinology Evaluation: Type 2 Diabetes Mellitus  Initial Endocrine Consultative Visit: 02/16/2019    PATIENT IDENTIFIER: Morgan [name] is a 54 y.o. female with a past medical history of HTN, OSA,T2DM,and obesity, Ovarian cancer(S/P surgery )  and melanoma. The patient has followed with Endocrinology clinic since 02/16/2019 for consultative assistance with management of her diabetes.  DIABETIC HISTORY:  Morgan [name] was diagnosed with T2DM in 2000. She has been on Metformin, Glipizide and Actos since her diagnosis. Her hemoglobin A1c has ranged from 6.8% in 2018, peaking at 8.3% in 2020.  Rybelsus added in 01/2019 Pioglitazone stopped 05/2019  SUBJECTIVE:   During the last visit (08/24/2019): A1c 6.6 %.  We are continued Metformin , Rybelsus, and glipizide  Today (02/22/2020): Morgan [name] is here for a 6 month follow up on diabetes management.   She checks her blood sugars 1  times daily, preprandial to breakfast and bedtime. The patient has not had hypoglycemic episodes since the last clinic visit.   Had numbness of the tips of her bilateral fingers.    HOME DIABETES REGIMEN:  - Metformin half a tablet twice a day  - Glipizide 5 mg,1 tablet twice a day - Rybelsus 7 mg daily    Glucose Log :  BG 103-255 mg/dL   HISTORY:  Past Medical History:  Past Medical History:  Diagnosis Date  . Anal fissure    saw Dr. Mercy Riding   . Anxiety   . Arthritis   . Depression   . Diabetes mellitus   . GERD (gastroesophageal reflux disease)   . History of ovarian cancer in adulthood   . Hyperlipidemia   . Hypertension   . Ovarian cancer ([place])   . Skin cancer (melanoma) ([place])   . Sleep apnea    uses CPAP   . Tubular adenoma of colon 01/2014   Past Surgical History:  Past Surgical History:  Procedure Laterality Date  .  ABDOMINAL HYSTERECTOMY  1986   BSO  . BASAL CELL CARCINOMA EXCISION  10-09-12   lower lip per Dr. Link Snuffer   . COLONOSCOPY  02-17-14   per Dr. Olevia Perches, adenomatous polyps, repeat in 5 yrs   . GLBS tumor ovary 1986    . HEMORRHOID SURGERY    . HIP FRACTURE SURGERY    . MELANOMA EXCISION  1997   upper chest     Social History:  reports that she has never smoked. She has never used smokeless tobacco. She reports that she does not drink alcohol and does not use drugs. Family History:  Family History  Problem Relation Age of Onset  . Ovarian cancer Sister   . Asthma Mother   . Arthritis Other   . Diabetes Other   . Prostate cancer Other   . Breast cancer Other   . Coronary artery disease Other   . Colon cancer Paternal Grandfather   . Pancreatic cancer Neg Hx   . Stomach cancer Neg Hx   . Esophageal cancer Neg Hx   . Rectal cancer Neg Hx   . Colon polyps Neg Hx      HOME MEDICATIONS: Allergies as of 02/22/2020      Reactions   Lisinopril Cough      Medication List       Accurate as of February 22, 2020  4:02 PM. If you have any questions, ask your nurse or doctor.        STOP taking these medications   hydrocortisone 2.5 % rectal cream Commonly known as: Anusol-HC Stopped by: Dorita Sciara, MD     TAKE these medications   ALPRAZolam 0.5 MG tablet Commonly known as: XANAX TAKE 1 TABLET BY MOUTH AT BEDTIME AS NEEDED FOR ANXIETY   atorvastatin 80 MG tablet Commonly known as: LIPITOR TAKE ONE TABLET BY MOUTH DAILY   glipiZIDE 5 MG tablet Commonly known as: GLUCOTROL TAKE 1 TABLET TWICE A DAY BEFORE MEALS   losartan-hydrochlorothiazide 50-12.5 MG tablet Commonly known as: HYZAAR TAKE ONE TABLET BY MOUTH DAILY   metFORMIN 1000 MG tablet Commonly known as: GLUCOPHAGE TAKE 1 TABLET BY MOUTH TWO TIMES A DAY WITH A MEAL   metoprolol tartrate 50 MG tablet Commonly known as: LOPRESSOR TAKE ONE TABLET BY MOUTH TWICE A DAY   omeprazole 40 MG  capsule Commonly known as: PRILOSEC TAKE ONE CAPSULE BY MOUTH TWICE A DAY   ONE TOUCH LANCETS Misc Check blood sugar 1-2 daily for OneTouch Ultra Mini system Dx code: E11.9   ONE TOUCH ULTRA MINI w/Device Kit Use to check blood sugar 1-2 times a day.   OneTouch Ultra test strip Generic drug: glucose blood TEST 1-2 TIMES A DAY   Rybelsus 7 MG Tabs Generic drug: Semaglutide Take 7 mg by mouth daily before breakfast.   sertraline 100 MG tablet Commonly known as: ZOLOFT Take 1 tablet (100 mg total) by mouth daily.        OBJECTIVE:   Vital Signs: BP 118/78 (BP Location: Left Arm, Patient Position: Sitting, Cuff Size: Normal)   Pulse 73   Ht '5\' 4"'  (1.626 m)   Wt 241 lb 6.4 oz (109.5 kg)   LMP 03/22/1986   SpO2 93%   BMI 41.44 kg/m   Wt Readings from Last 3 Encounters:  02/22/20 241 lb 6.4 oz (109.5 kg)  11/04/19 243 lb 9.6 oz (110.5 kg)  10/20/19 245 lb 8 oz (111.4 kg)     Exam: General: Pt appears well and is in NAD  Lungs: Clear with good BS bilat with no rales, rhonchi, or wheezes  Heart: RRR with normal S1 and S2 and no gallops; no murmurs; no rub  Abdomen: Normoactive bowel sounds, soft, nontender, without masses or organomegaly palpable  Extremities: No pretibial edema.  Skin: Normal texture and temperature to palpation.  Neuro: MS is good with appropriate affect, pt is alert and Ox3      DM Foot exam 02/22/2020 The skin of the feet is intact without sores or ulcerations. The pedal pulses are 2+ on right and 2+ on left. The sensation is absent at the great toes to a screening 5.07, 10 gram monofilament bilaterally    DATA REVIEWED:  Lab Results  Component Value Date   HGBA1C 6.6 (A) 02/22/2020   HGBA1C 6.6 (A) 08/24/2019   HGBA1C 5.8 (A) 05/20/2019      Results for Morgan, [name] (MRN [id number]) as of 02/24/2020 11:21  Ref. Range 02/22/2020 16:16  Sodium Latest Ref Range: 135 - 146 mmol/L 141  Potassium Latest Ref Range: 3.5 - 5.3  mmol/L 3.8  Chloride Latest Ref Range: 98 - 110 mmol/L 102  CO2 Latest Ref Range: 20 - 32 mmol/L 29  Glucose Latest Ref Range: 65 - 99 mg/dL 102 (H)  BUN Latest Ref Range: 7 - 25 mg/dL 17  Creatinine Latest Ref Range: 0.50 -  1.05 mg/dL 0.83  Calcium Latest Ref Range: 8.6 - 10.4 mg/dL 9.3  BUN/Creatinine Ratio Latest Ref Range: 6 - 22 (calc) NOT APPLICABLE  Total CHOL/HDL Ratio Latest Ref Range: <5.0 (calc) 4.1  Cholesterol Latest Ref Range: <200 mg/dL 148  HDL Cholesterol Latest Ref Range: > OR = 50 mg/dL 36 (L)  LDL Cholesterol (Calc) Latest Units: mg/dL (calc) 72  MICROALB/CREAT RATIO Latest Ref Range: <30 mcg/mg creat 3  Non-HDL Cholesterol (Calc) Latest Ref Range: <130 mg/dL (calc) 112  Triglycerides Latest Ref Range: <150 mg/dL 328 (H)  Microalb, Ur Latest Units: mg/dL 0.5  Creatinine, Urine Latest Ref Range: 20 - 275 mg/dL 189          ASSESSMENT / PLAN / RECOMMENDATIONS:   1) Type 2 Diabetes Mellitus, Optimally  Controlled, With Neuropathic  complications - Most recent A1c of 6.6 %. Goal A1c < 7.0 %.   - She continues with optimal glycemic control - I have encouraged her to continue with lifestyle changes  MEDICATIONS:  - Continue Glipizide 5 mg before Breakfast and Supper - Continue Metformin 1000 mg  half a tablet twice a day  - Continue Rybelsus 7 mg daily    EDUCATION / INSTRUCTIONS:  BG monitoring instructions: Patient is instructed to check her blood sugars 3 times a week, fasting.  Call [facility] if: BG persistently < 70 or > 300. . I reviewed the Rule of 15 for the treatment of hypoglycemia in detail with the patient. Literature supplied.    2) Diabetic complications:   Eye: Does not  have known diabetic retinopathy.   Neuro/ Feet: Does not have known diabetic peripheral neuropathy.  Renal: Patient does not have known baseline CKD. Normal MA/Cr ratio    3) Dyslipidemia: Patient is on lipitor 80 mg daily . LDL at goal but her Tg is  elevated. Encouraged low carb diet . Will consider add-on therapy if not better on next check .   F/U in 6 months    Signed electronically by: Mack Guise, MD  [facility]  [facility] [address] [place], [place] [address] Phone: [phone] FAX: [phone]   CC: Laurey Morale, [place] [address] Phone: [phone]  Fax: [phone]  Return to Endocrinology clinic as below: No future appointments.

## 2020-02-23 ENCOUNTER — Encounter: Payer: Self-pay | Admitting: Internal Medicine

## 2020-02-23 LAB — BASIC METABOLIC PANEL
BUN: 17 mg/dL (ref 7–25)
CO2: 29 mmol/L (ref 20–32)
Calcium: 9.3 mg/dL (ref 8.6–10.4)
Chloride: 102 mmol/L (ref 98–110)
Creat: 0.83 mg/dL (ref 0.50–1.05)
Glucose, Bld: 102 mg/dL — ABNORMAL HIGH (ref 65–99)
Potassium: 3.8 mmol/L (ref 3.5–5.3)
Sodium: 141 mmol/L (ref 135–146)

## 2020-02-23 LAB — LIPID PANEL
Cholesterol: 148 mg/dL (ref <200)
HDL: 36 mg/dL — ABNORMAL LOW (ref >=50)
LDL Cholesterol (Calc): 72 mg/dL (calc)
Non-HDL Cholesterol (Calc): 112 mg/dL (calc) (ref <130)
Total CHOL/HDL Ratio: 4.1 (calc) (ref <5.0)
Triglycerides: 328 mg/dL — ABNORMAL HIGH (ref <150)

## 2020-02-23 LAB — MICROALBUMIN / CREATININE URINE RATIO
Creatinine, Urine: 189 mg/dL (ref 20–275)
Microalb Creat Ratio: 3 mcg/mg creat (ref <30)
Microalb, Ur: 0.5 mg/dL

## 2020-03-02 ENCOUNTER — Ambulatory Visit (INDEPENDENT_AMBULATORY_CARE_PROVIDER_SITE_OTHER): Payer: Commercial Managed Care - PPO | Admitting: Family Medicine

## 2020-03-02 ENCOUNTER — Encounter: Payer: Self-pay | Admitting: Family Medicine

## 2020-03-02 ENCOUNTER — Other Ambulatory Visit: Payer: Self-pay

## 2020-03-02 VITALS — BP 124/60 | HR 79 | Temp 98.6°F | Wt 239.4 lb

## 2020-03-02 DIAGNOSIS — G629 Polyneuropathy, unspecified: Secondary | ICD-10-CM | POA: Diagnosis not present

## 2020-03-02 NOTE — Progress Notes (Signed)
   Subjective:    Patient ID: Morgan [name], female    DOB: Sep 14, 1965, 54 y.o.   MRN: [id number]  HPI Here for tingling and numbness and pain in all 10 fingers which started about 6 months ago. This is always worst when she gets up in the mornings and then it gets better the rest of the day. She has been told in the past that she has carpal tunnel syndrome but she ha never been tested for this. She also has some numbness and tingling in all of her toes. She sees Dr. Kelton Pillar for her diabetes, and her recent A1c was 6.6.    Review of Systems  Constitutional: Negative.   Respiratory: Negative.   Cardiovascular: Negative.   Neurological: Positive for numbness.       Objective:   Physical Exam Constitutional:      Appearance: Normal appearance. She is not ill-appearing.  Cardiovascular:     Rate and Rhythm: Normal rate and regular rhythm.     Pulses: Normal pulses.     Heart sounds: Normal heart sounds.  Pulmonary:     Effort: Pulmonary effort is normal.     Breath sounds: Normal breath sounds.  Skin:    Comments: Her hands appear normal, skin is warm and pink, pulses are full   Neurological:     Mental Status: She is alert.           Assessment & Plan:  She likely has carpal tunnel syndrome, given the fact that this is worst in the mornings. She could certainly have a degree of neuropathy going on as well. I advised her to get a pair of wrist splints to wear to bed every night to see if this helps. It this does not help at all we will send her for nerve conduction studies on the arms.  [name], MD

## 2020-03-08 ENCOUNTER — Other Ambulatory Visit: Payer: Self-pay | Admitting: Family Medicine

## 2020-04-06 ENCOUNTER — Encounter: Payer: Self-pay | Admitting: Family Medicine

## 2020-04-10 ENCOUNTER — Other Ambulatory Visit: Payer: Self-pay | Admitting: Family Medicine

## 2020-05-11 ENCOUNTER — Other Ambulatory Visit: Payer: Self-pay | Admitting: Gastroenterology

## 2020-06-03 ENCOUNTER — Other Ambulatory Visit: Payer: Self-pay | Admitting: Family Medicine

## 2020-06-15 ENCOUNTER — Encounter: Payer: Self-pay | Admitting: Family Medicine

## 2020-06-19 ENCOUNTER — Other Ambulatory Visit: Payer: Self-pay

## 2020-06-19 ENCOUNTER — Encounter: Payer: Self-pay | Admitting: Family Medicine

## 2020-06-19 ENCOUNTER — Telehealth (INDEPENDENT_AMBULATORY_CARE_PROVIDER_SITE_OTHER): Payer: Commercial Managed Care - PPO | Admitting: Family Medicine

## 2020-06-19 DIAGNOSIS — U071 COVID-19: Secondary | ICD-10-CM

## 2020-06-19 MED ORDER — AZITHROMYCIN 250 MG PO TABS: ORAL_TABLET | ORAL | 0 refills | Status: DC

## 2020-06-19 MED ORDER — TOBRAMYCIN-DEXAMETHASONE 0.3-0.1 % OP SUSP: 2.0000 [drp] | OPHTHALMIC | 0 refills | Status: DC

## 2020-06-19 NOTE — Progress Notes (Signed)
   Subjective:    Patient ID: Morgan [name], female    DOB: 1965/12/02, 54 y.o.   MRN: [id number]  HPI Virtual Visit via Telephone Note  I connected with the patient on 06/19/20 at  4:00 PM EST by telephone and verified that I am speaking with the correct person using two identifiers.   I discussed the limitations, risks, security and privacy concerns of performing an evaluation and management service by telephone and the availability of in person appointments. I also discussed with the patient that there may be a patient responsible charge related to this service. The patient expressed understanding and agreed to proceed.  Location patient: home Location provider: work or home office Participants present for the call: patient, provider Patient did not have a visit in the prior 7 days to address this/these issue(s).   History of Present Illness: Here for a Covid-19 infection. Last week she began to have sneezing and a runny nose. No fever and no other symptoms at that time. She learned that 15 of her coworkers have tested positive for the virus, so on 06-15-20 she went to an urgent care to be tested. The rapid test was positive, so she has been quarantining at home. In addition to the first symptoms she now also has chest congestion, a dry cough, and red itchy eyes with mucus discharge. No fever or SOB. No body aches or NVD. She has been taking Mucinex BID and drinking fluids.    Observations/Objective: Patient sounds cheerful and well on the phone. I do not appreciate any SOB. Speech and thought processing are grossly intact. Patient reported vitals:  Assessment and Plan: Covid-19 infection. We will treat her with a Zpack and with Tobradex eyes drops. She will quarantine for a total of 10 days. Follow up as needed.  [name], MD   Follow Up Instructions:     [phone] 5-10 [phone] 11-20 [phone] 21-30 I did not refer this patient for an OV in the next 24 hours for this/these  issue(s).  I discussed the assessment and treatment plan with the patient. The patient was provided an opportunity to ask questions and all were answered. The patient agreed with the plan and demonstrated an understanding of the instructions.   The patient was advised to call back or seek an in-person evaluation if the symptoms worsen or if the condition fails to improve as anticipated.  I provided 16 minutes of non-face-to-face time during this encounter.   [name], MD    Review of Systems     Objective:   Physical Exam        Assessment & Plan:

## 2020-06-23 ENCOUNTER — Encounter: Payer: Self-pay | Admitting: Family Medicine

## 2020-06-25 ENCOUNTER — Encounter: Payer: Self-pay | Admitting: Family Medicine

## 2020-06-26 ENCOUNTER — Other Ambulatory Visit: Payer: Self-pay

## 2020-06-26 MED ORDER — LOSARTAN POTASSIUM-HCTZ 50-12.5 MG PO TABS: 1.0000 | ORAL_TABLET | Freq: Every day | ORAL | 1 refills | Status: DC

## 2020-06-26 NOTE — Telephone Encounter (Signed)
Stay on Mucinex 1200 mg BID until she feels better

## 2020-06-30 ENCOUNTER — Other Ambulatory Visit: Payer: Self-pay | Admitting: Family Medicine

## 2020-07-01 ENCOUNTER — Other Ambulatory Visit: Payer: Self-pay | Admitting: Family Medicine

## 2020-07-02 NOTE — Telephone Encounter (Signed)
Last seen-06-19-2020 Last refill- 12-01-2019 Last labs- 02-22-20

## 2020-07-03 MED ORDER — ATORVASTATIN CALCIUM 80 MG PO TABS: 80.0000 mg | ORAL_TABLET | Freq: Every day | ORAL | 3 refills | Status: DC

## 2020-07-04 NOTE — Telephone Encounter (Signed)
I would stay on the Mucinex and Flonase. Try placing a cool mist humidifier in the bedroom

## 2020-07-17 ENCOUNTER — Other Ambulatory Visit: Payer: Self-pay | Admitting: Internal Medicine

## 2020-07-17 ENCOUNTER — Encounter: Payer: Self-pay | Admitting: Internal Medicine

## 2020-07-30 ENCOUNTER — Encounter: Payer: Self-pay | Admitting: Family Medicine

## 2020-07-30 ENCOUNTER — Other Ambulatory Visit: Payer: Self-pay | Admitting: Family Medicine

## 2020-07-31 NOTE — Telephone Encounter (Signed)
Last office visit-06/19/2020 Last refill--12/30/2019

## 2020-08-03 ENCOUNTER — Ambulatory Visit: Payer: Commercial Managed Care - PPO | Admitting: Family Medicine

## 2020-08-13 ENCOUNTER — Other Ambulatory Visit: Payer: Self-pay | Admitting: Gastroenterology

## 2020-08-29 ENCOUNTER — Encounter: Payer: Self-pay | Admitting: Family Medicine

## 2020-08-29 ENCOUNTER — Ambulatory Visit: Payer: Commercial Managed Care - PPO | Admitting: Internal Medicine

## 2020-08-29 ENCOUNTER — Other Ambulatory Visit: Payer: Self-pay

## 2020-08-29 MED ORDER — SERTRALINE HCL 100 MG PO TABS: 100.0000 mg | ORAL_TABLET | Freq: Every day | ORAL | 0 refills | Status: DC

## 2020-08-31 ENCOUNTER — Other Ambulatory Visit: Payer: Self-pay | Admitting: Internal Medicine

## 2020-09-02 ENCOUNTER — Encounter: Payer: Self-pay | Admitting: Internal Medicine

## 2020-09-04 ENCOUNTER — Ambulatory Visit (INDEPENDENT_AMBULATORY_CARE_PROVIDER_SITE_OTHER): Payer: Commercial Managed Care - PPO | Admitting: Internal Medicine

## 2020-09-04 ENCOUNTER — Encounter: Payer: Self-pay | Admitting: Internal Medicine

## 2020-09-04 ENCOUNTER — Other Ambulatory Visit: Payer: Self-pay

## 2020-09-04 ENCOUNTER — Ambulatory Visit: Payer: Commercial Managed Care - PPO | Admitting: Internal Medicine

## 2020-09-04 VITALS — BP 140/90 | HR 82 | Ht 64.0 in | Wt 227.5 lb

## 2020-09-04 DIAGNOSIS — E785 Hyperlipidemia, unspecified: Secondary | ICD-10-CM

## 2020-09-04 DIAGNOSIS — E538 Deficiency of other specified B group vitamins: Secondary | ICD-10-CM

## 2020-09-04 DIAGNOSIS — E782 Mixed hyperlipidemia: Secondary | ICD-10-CM

## 2020-09-04 DIAGNOSIS — G629 Polyneuropathy, unspecified: Secondary | ICD-10-CM

## 2020-09-04 DIAGNOSIS — E1142 Type 2 diabetes mellitus with diabetic polyneuropathy: Secondary | ICD-10-CM | POA: Diagnosis not present

## 2020-09-04 LAB — POCT GLYCOSYLATED HEMOGLOBIN (HGB A1C): Hemoglobin A1C: 5.9 % — AB (ref 4.0–5.6)

## 2020-09-04 NOTE — Progress Notes (Signed)
Name: Morgan [name]  Age/ Sex: 55 y.o., female   MRN/ DOB: [id number], [birth year]     PCP: Laurey Morale, MD   Reason for Endocrinology Evaluation: Type 2 Diabetes Mellitus  Initial Endocrine Consultative Visit: 02/16/2019    PATIENT IDENTIFIER: Morgan [name] is a 55 y.o. female with a past medical history of HTN, OSA,T2DM,and obesity, Ovarian cancer(S/P surgery )  and melanoma. The patient has followed with Endocrinology clinic since 02/16/2019 for consultative assistance with management of her diabetes.  DIABETIC HISTORY:  Morgan [name] was diagnosed with T2DM in 2000. She has been on Metformin, Glipizide and Actos since her diagnosis. Her hemoglobin A1c has ranged from 6.8% in 2018, peaking at 8.3% in 2020.  Rybelsus added in 01/2019 Pioglitazone stopped 05/2019  SUBJECTIVE:   During the last visit (02/22/2020): A1c 6.6 %.  We are continued Metformin , Rybelsus, and glipizide  Today (09/04/2020): Morgan [name] is here for a 6 month follow up on diabetes management.   She checks her blood sugars occasionally. The patient has not had hypoglycemic episodes since the last clinic visit.  She has been noted with weight loss  Denies nausea or diarrhea  Felt a knot at the left jaw    HOME DIABETES REGIMEN:  - Metformin half a tablet twice a day  - Glipizide 5 mg,1 tablet twice a day - Rybelsus 7 mg daily    Glucose Log :  BG 103-255 mg/dL       HISTORY:  Past Medical History:  Past Medical History:  Diagnosis Date   Anal fissure    saw Dr. Mercy Riding    Anxiety    Arthritis    Depression    Diabetes mellitus    GERD (gastroesophageal reflux disease)    History of ovarian cancer in adulthood    Hyperlipidemia    Hypertension    Ovarian cancer ([place])    Skin cancer (melanoma) ([place])    Sleep apnea    uses CPAP    Tubular adenoma of colon 01/2014   Past Surgical History:  Past Surgical History:  Procedure Laterality  Date   ABDOMINAL HYSTERECTOMY  1986   BSO   BASAL CELL CARCINOMA EXCISION  10-09-12   lower lip per Dr. Link Snuffer    COLONOSCOPY  02-17-14   per Dr. Olevia Perches, adenomatous polyps, repeat in 5 yrs    GLBS tumor ovary 1986     [place]   upper chest     Social History:  reports that she has never smoked. She has never used smokeless tobacco. She reports that she does not drink alcohol and does not use drugs. Family History:  Family History  Problem Relation Age of Onset   Ovarian cancer Sister    Asthma Mother    Arthritis Other    Diabetes Other    Prostate cancer Other    Breast cancer Other    Coronary artery disease Other    Colon cancer Paternal Grandfather    Pancreatic cancer Neg Hx    Stomach cancer Neg Hx    Esophageal cancer Neg Hx    Rectal cancer Neg Hx    Colon polyps Neg Hx      HOME MEDICATIONS: Allergies as of 09/04/2020      Reactions   Lisinopril Cough      Medication List  Accurate as of September 04, 2020 12:10 PM. If you have any questions, ask your nurse or doctor.        ALPRAZolam 0.5 MG tablet Commonly known as: XANAX TAKE 1 TABLET BY MOUTH AT BEDTIME AS NEEDED FOR ANXIETY   atorvastatin 80 MG tablet Commonly known as: LIPITOR Take 1 tablet (80 mg total) by mouth daily.   azithromycin 250 MG tablet Commonly known as: Zithromax Z-Pak As directed   glipiZIDE 5 MG tablet Commonly known as: GLUCOTROL TAKE 1 TABLET TWICE A DAY BEFORE MEALS   losartan-hydrochlorothiazide 50-12.5 MG tablet Commonly known as: HYZAAR Take 1 tablet by mouth daily.   metFORMIN 1000 MG tablet Commonly known as: GLUCOPHAGE TAKE ONE TABLET BY MOUTH TWICE A DAY WITH MEALS   metoprolol tartrate 50 MG tablet Commonly known as: LOPRESSOR Take 1 tablet (50 mg total) by mouth 2 (two) times daily.   omeprazole 40 MG capsule Commonly known as: PRILOSEC TAKE 1 CAPSULE BY MOUTH 2  TIMES DAILY **NEEDS APPOINTMENT**   ONE TOUCH LANCETS Misc Check blood sugar 1-2 daily for OneTouch Ultra Mini system Dx code: E11.9   ONE TOUCH ULTRA MINI w/Device Kit Use to check blood sugar 1-2 times a day.   OneTouch Ultra test strip Generic drug: glucose blood TEST 1-2 TIMES A DAY   Rybelsus 7 MG Tabs Generic drug: Semaglutide TAKE 1 TABLET DAILY BEFORE BREAKFAST   sertraline 100 MG tablet Commonly known as: ZOLOFT Take 1 tablet (100 mg total) by mouth daily.   tobramycin-dexamethasone ophthalmic solution Commonly known as: TobraDex Place 2 drops into both eyes every 4 (four) hours while awake.        OBJECTIVE:   Vital Signs: BP 140/90    Pulse 82    Ht _0  (1.626 m)    Wt 227 lb 8 oz (103.2 kg)    LMP 03/22/1986    SpO2 98%    BMI 39.05 kg/m   Wt Readings from Last 3 Encounters:  03/02/20 239 lb 6.4 oz (108.6 kg)  02/22/20 241 lb 6.4 oz (109.5 kg)  11/04/19 243 lb 9.6 oz (110.5 kg)     Exam: General: Pt appears well and is in NAD  Lungs: Clear with good BS bilat with no rales, rhonchi, or wheezes  Heart: RRR with normal S1 and S2 and no gallops; no murmurs; no rub  Abdomen: Normoactive bowel sounds, soft, nontender, without masses or organomegaly palpable  Extremities: No pretibial edema.  Skin: Normal texture and temperature to palpation.  Neuro: MS is good with appropriate affect, pt is alert and Ox3      DM Foot exam 09/04/2020 The skin of the feet is intact without sores or ulcerations. The pedal pulses are 2+ on right and 2+ on left. The sensation is absent on the right  to a screening 5.07, 10 gram monofilament and decreased on the left    DATA REVIEWED:  Lab Results  Component Value Date   HGBA1C 6.6 (A) 02/22/2020   HGBA1C 6.6 (A) 08/24/2019   HGBA1C 5.8 (A) 05/20/2019      Results for MAO, LOCKNER (MRN [id number]) as of 09/05/2020 16:24  Ref. Range 09/04/2020 15:36  Sodium Latest Ref Range: 135 - 145 mEq/L 141  Potassium  Latest Ref Range: 3.5 - 5.1 mEq/L 3.6  Chloride Latest Ref Range: 96 - 112 mEq/L 102  CO2 Latest Ref Range: 19 - 32 mEq/L 26  Glucose Latest Ref Range: 70 - 99 mg/dL 91  BUN Latest  Ref Range: 6 - 23 mg/dL 16  Creatinine Latest Ref Range: 0.40 - 1.20 mg/dL 0.91  Calcium Latest Ref Range: 8.4 - 10.5 mg/dL 9.6  GFR Latest Ref Range: >60.00 mL/min 70.33  Vitamin B12 Latest Ref Range: 211 - 911 pg/mL 147 (L)     ASSESSMENT / PLAN / RECOMMENDATIONS:   1) Type 2 Diabetes Mellitus, Optimally  Controlled, With Neuropathic  complications - Most recent A1c of 5.9 %. Goal A1c < 7.0 %.   - She continues with optimal glycemic control - Will reduce Glipizide as below  - I have encouraged her to continue with lifestyle changes     MEDICATIONS:  - Decrease  Glipizide 5 mg, 1 tablet  Before Supper - Continue Metformin 1000 mg  half a tablet twice a day  - Continue Rybelsus 7 mg daily    EDUCATION / INSTRUCTIONS:  BG monitoring instructions: Patient is instructed to check her blood sugars 3 times a week, fasting.  Call [facility] if: BG persistently < 70 or > 300.  I reviewed the Rule of 15 for the treatment of hypoglycemia in detail with the patient. Literature supplied.    2) Diabetic complications:   Eye: Does not  have known diabetic retinopathy.   Neuro/ Feet: Does not have known diabetic peripheral neuropathy.  Renal: Patient does not have known baseline CKD. Normal MA/Cr ratio    3) Dyslipidemia:   - LDL at goal 72 mg/dL  - Tg  continues to be elevated   - Patient is on lipitor 80 mg daily    4) Facial Asymmetry:  - She feels thickening on the left jaw, on exam I did not feel a cyst or a nodule but it felt more like asymmetrical fatty distribution    5) Cushingoid features:  - Given DM and fat distribution at the base of the neck , will proceed with screening for cushing syndrome with 24 hr urine collection    6) Peripheral neuropathy:    -  Mots like secondary to DM but will check Vitamin B12   7) Vitamin B12 Deficiency :  - Pt to start OTC B Complex      F/U in 6 months    Signed electronically by: Mack Guise, MD  [facility]  [facility] [address] [place], [place] [address] Phone: [phone] FAX: [phone]   CC: Laurey Morale, [address] [place] [address] Phone: [phone]  Fax: [phone]  Return to Endocrinology clinic as below: Future Appointments  Date Time Provider [place]  09/04/2020  3:40 PM Carri Spillers, Melanie Crazier, MD [facility]  09/11/2020  3:30 PM Laurey Morale, MD [facility]

## 2020-09-04 NOTE — Patient Instructions (Addendum)
-   Decrease Glipizide 5 mg, 1 tablet before Supper - Continue Metformin half a tablet twice a day  - Continue Rybelsus 7 mg daily     24-Hour Urine Collection   You will be collecting your urine for a 24-hour period of time.  Your timer starts with your first urine of the morning (For example - If you first pee at [place], your timer will start at [place])  [place] away your first urine of the morning  Collect your urine every time you pee for the next 24 hours STOP your urine collection 24 hours after you started the collection (For example - You would stop at 9AM the day after you started)    HOW TO TREAT LOW BLOOD SUGARS (Blood sugar LESS THAN 70 MG/DL)  Please follow the RULE OF 15 for the treatment of hypoglycemia treatment (when your (blood sugars are less than 70 mg/dL)    STEP 1: Take 15 grams of carbohydrates when your blood sugar is low, which includes:   3-4 GLUCOSE TABS  OR  3-4 OZ OF JUICE OR REGULAR SODA OR  ONE TUBE OF GLUCOSE GEL     STEP 2: RECHECK blood sugar in 15 MINUTES STEP 3: If your blood sugar is still low at the 15 minute recheck --> then, go back to STEP 1 and treat AGAIN with another 15 grams of carbohydrates.

## 2020-09-05 DIAGNOSIS — E538 Deficiency of other specified B group vitamins: Secondary | ICD-10-CM | POA: Insufficient documentation

## 2020-09-05 LAB — BASIC METABOLIC PANEL
BUN: 16 mg/dL (ref 6–23)
CO2: 26 mEq/L (ref 19–32)
Calcium: 9.6 mg/dL (ref 8.4–10.5)
Chloride: 102 mEq/L (ref 96–112)
Creatinine, Ser: 0.91 mg/dL (ref 0.40–1.20)
GFR: 70.33 mL/min (ref >60.00)
Glucose, Bld: 91 mg/dL (ref 70–99)
Potassium: 3.6 mEq/L (ref 3.5–5.1)
Sodium: 141 mEq/L (ref 135–145)

## 2020-09-05 LAB — VITAMIN B12: Vitamin B-12: 147 pg/mL — ABNORMAL LOW (ref 211–911)

## 2020-09-07 ENCOUNTER — Encounter: Payer: Self-pay | Admitting: Family Medicine

## 2020-09-10 ENCOUNTER — Other Ambulatory Visit: Payer: Self-pay

## 2020-09-10 ENCOUNTER — Other Ambulatory Visit: Payer: Commercial Managed Care - PPO

## 2020-09-10 ENCOUNTER — Encounter: Payer: Self-pay | Admitting: Internal Medicine

## 2020-09-10 DIAGNOSIS — E1142 Type 2 diabetes mellitus with diabetic polyneuropathy: Secondary | ICD-10-CM

## 2020-09-10 NOTE — Progress Notes (Signed)
Start time:  3/12 @ 7am End time:  3/13 @ 7am  Total volume:  1256mL

## 2020-09-11 ENCOUNTER — Ambulatory Visit: Payer: Commercial Managed Care - PPO | Admitting: Internal Medicine

## 2020-09-11 ENCOUNTER — Encounter: Payer: Self-pay | Admitting: Family Medicine

## 2020-09-11 ENCOUNTER — Other Ambulatory Visit: Payer: Self-pay | Admitting: Gastroenterology

## 2020-09-11 ENCOUNTER — Ambulatory Visit (INDEPENDENT_AMBULATORY_CARE_PROVIDER_SITE_OTHER): Payer: Commercial Managed Care - PPO | Admitting: Family Medicine

## 2020-09-11 VITALS — BP 112/80 | HR 77 | Temp 98.6°F | Wt 229.0 lb

## 2020-09-11 DIAGNOSIS — I10 Essential (primary) hypertension: Secondary | ICD-10-CM | POA: Diagnosis not present

## 2020-09-11 DIAGNOSIS — F419 Anxiety disorder, unspecified: Secondary | ICD-10-CM | POA: Diagnosis not present

## 2020-09-11 DIAGNOSIS — F32A Depression, unspecified: Secondary | ICD-10-CM

## 2020-09-11 MED ORDER — BUPROPION HCL ER (XL) 150 MG PO TB24: 150.0000 mg | ORAL_TABLET | Freq: Every day | ORAL | 2 refills | Status: DC

## 2020-09-11 NOTE — Progress Notes (Signed)
   Subjective:    Patient ID: Morgan [name], female    DOB: 02-11-1966, 55 y.o.   MRN: [id number]  HPI Here to follow up on anxiety with depression and on HTN. She sees Dr. Kelton Pillar regularly for her diabetes and this has been doing very well. Her A1c a week ago was 5.9. They are planning on a 24 hour urine soon to check for Cushings disease. Her BP has been stable. However she has been struggling with her moods over the past few months. She deals with some anxiety every day, but her depression has been more of a problem. She feels sad and hopeless, she gets tearful, and she sometimes thinks about dying in an abstract sort of way. She denies any intent or plan. She says she sees other people in her life who "have it all" and they seem so happy. On the other hand she feels left out at times. She has been taking Zoloft for a number of years now. She uses Xanax periodically. Her appetite and sleep are intact.    Review of Systems  Constitutional: Negative.   Respiratory: Negative.   Cardiovascular: Negative.   Psychiatric/Behavioral: Positive for decreased concentration and dysphoric mood. Negative for agitation, behavioral problems, confusion, hallucinations, self-injury, sleep disturbance and suicidal ideas. The patient is nervous/anxious.        Objective:   Physical Exam Constitutional:      Appearance: Normal appearance.  Cardiovascular:     Rate and Rhythm: Normal rate and regular rhythm.     Pulses: Normal pulses.     Heart sounds: Normal heart sounds.  Pulmonary:     Effort: Pulmonary effort is normal.     Breath sounds: Normal breath sounds.  Neurological:     General: No focal deficit present.     Mental Status: She is alert and oriented to person, place, and time.  Psychiatric:        Behavior: Behavior normal.        Thought Content: Thought content normal.        Judgment: Judgment normal.     Comments: Her affect is depressed but she has good eye contact             Assessment & Plan:  Her HTN is well controlled. Her anxiety is reasonably controlled, but her depression has been increased. We will start her on Wellbutrin XL 150 mg daily. Stay on the Zoloft for now. Add Xanax as needed. I suggested she begin seeing a therapist, but she wants to "think about it" first. Follow up with me in 4 weeks.  [name], MD

## 2020-09-14 LAB — CORTISOL, URINE, 24 HOUR
24 Hour urine volume (VMAHVA): 1250 mL
CREATININE, URINE: 1.42 g/(24.h) (ref 0.50–2.15)
Cortisol (Ur), Free: 12 mcg/24 h (ref 4.0–50.0)

## 2020-09-17 ENCOUNTER — Encounter: Payer: Self-pay | Admitting: Family Medicine

## 2020-09-17 ENCOUNTER — Telehealth (INDEPENDENT_AMBULATORY_CARE_PROVIDER_SITE_OTHER): Payer: Commercial Managed Care - PPO | Admitting: Family Medicine

## 2020-09-17 DIAGNOSIS — J019 Acute sinusitis, unspecified: Secondary | ICD-10-CM | POA: Diagnosis not present

## 2020-09-17 MED ORDER — AZITHROMYCIN 250 MG PO TABS: ORAL_TABLET | ORAL | 0 refills | Status: DC

## 2020-09-17 NOTE — Progress Notes (Signed)
   Subjective:    Patient ID: Morgan [name], female    DOB: 12-05-1965, 55 y.o.   MRN: [id number]  HPI Virtual Visit via Telephone Note  I connected with the patient on 09/17/20 at  3:45 PM EDT by telephone and verified that I am speaking with the correct person using two identifiers.   I discussed the limitations, risks, security and privacy concerns of performing an evaluation and management service by telephone and the availability of in person appointments. I also discussed with the patient that there may be a patient responsible charge related to this service. The patient expressed understanding and agreed to proceed.  Location patient: home Location provider: work or home office Participants present for the call: patient, provider Patient did not have a visit in the prior 7 days to address this/these issue(s).   History of Present Illness: Here for one week of sinus pressure, blowing green mucus from the nose, and a dry cough. No fever. She went to urgent care on 09-14-19 and I have reviewed these records. They had her use Mucinex, Benzonatate, and Atrovent sprays, but these have not helped.    Observations/Objective: Patient sounds cheerful and well on the phone. I do not appreciate any SOB. Speech and thought processing are grossly intact. Patient reported vitals:  Assessment and Plan: Sinusitis, treat with a Zpack.  [name], MD   Follow Up Instructions:     [phone] 5-10 [phone] 11-20 [phone] 21-30 I did not refer this patient for an OV in the next 24 hours for this/these issue(s).  I discussed the assessment and treatment plan with the patient. The patient was provided an opportunity to ask questions and all were answered. The patient agreed with the plan and demonstrated an understanding of the instructions.   The patient was advised to call back or seek an in-person evaluation if the symptoms worsen or if the condition fails to improve as anticipated.  I  provided 14 minutes of non-face-to-face time during this encounter.   [name], MD    Review of Systems     Objective:   Physical Exam        Assessment & Plan:

## 2020-09-19 ENCOUNTER — Ambulatory Visit: Payer: Commercial Managed Care - PPO | Admitting: Family Medicine

## 2020-10-08 ENCOUNTER — Other Ambulatory Visit: Payer: Self-pay | Admitting: Family Medicine

## 2020-11-08 ENCOUNTER — Other Ambulatory Visit: Payer: Self-pay | Admitting: Gastroenterology

## 2020-11-09 ENCOUNTER — Other Ambulatory Visit: Payer: Self-pay

## 2020-11-09 ENCOUNTER — Ambulatory Visit (INDEPENDENT_AMBULATORY_CARE_PROVIDER_SITE_OTHER): Payer: Commercial Managed Care - PPO | Admitting: Pulmonary Disease

## 2020-11-09 ENCOUNTER — Encounter: Payer: Self-pay | Admitting: Pulmonary Disease

## 2020-11-09 ENCOUNTER — Encounter: Payer: Self-pay | Admitting: Family Medicine

## 2020-11-09 VITALS — BP 126/80 | HR 72 | Temp 97.6°F | Ht 64.0 in | Wt 226.0 lb

## 2020-11-09 DIAGNOSIS — Z9989 Dependence on other enabling machines and devices: Secondary | ICD-10-CM

## 2020-11-09 DIAGNOSIS — E669 Obesity, unspecified: Secondary | ICD-10-CM

## 2020-11-09 DIAGNOSIS — G4730 Sleep apnea, unspecified: Secondary | ICD-10-CM | POA: Diagnosis not present

## 2020-11-09 DIAGNOSIS — G4733 Obstructive sleep apnea (adult) (pediatric): Secondary | ICD-10-CM | POA: Diagnosis not present

## 2020-11-09 NOTE — Patient Instructions (Signed)
Follow up in 1 year.

## 2020-11-09 NOTE — Progress Notes (Signed)
[facility]  Chief Complaint  Patient presents with  . Follow-up    55yrf/u for OSA. States she has been using her machine without any issues.     Constitutional:  BP 126/80   Pulse 72   Temp 97.6 F (36.4 C) (Temporal)   Ht '5\' 4"'  (1.626 m)   Wt 226 lb (102.5 kg)   LMP 03/22/1986   SpO2 96% Comment: on RA  BMI 38.79 kg/m   Past Medical History:  Anxiety, Depression, DM, GERD, Ovarian cancer, HLD, HTN, Melanoma, Colon polyp, COVID 19 infection December 2021  Past Surgical History:  She  has a past surgical history that includes Melanoma excision (1997); Abdominal hysterectomy (1986); Hemorrhoid surgery; Hip fracture surgery; Excision basal cell carcinoma (10-09-12); GLBS tumor ovary 1986; and Colonoscopy (02-17-14).  Brief Summary:  MNikitta Sobiechis a 55y.o. female with obstructive sleep apnea.      Subjective:   She uses CPAP nightly.  No issues with mask fit.  Not having sinus congestion, sore throat, dry mouth, or aerophagia.  Her CPAP setting was never decreased to 13 cm H2O.  She feels pressure at 15 cm H2O is comfortable.  Physical Exam:   Appearance - well kempt   ENMT - no sinus tenderness, no oral exudate, no LAN, Mallampati 3 airway, no stridor  Respiratory - equal breath sounds bilaterally, no wheezing or rales  CV - s1s2 regular rate and rhythm, no murmurs  Ext - no clubbing, no edema  Skin - no rashes  Psych - normal mood and affect   Pulmonary testing:   PFT 07/03/16 >> FEV1 2.57 (90%), FEV1% 92, DLCO 91%   Sleep Tests:   PSG 12/23/16 >> AHI 112, SpO2 low 55%  CPAP 10/09/20 to 11/07/20 >> used on 30 of 30 nights with average 6 hrs 54 min.  Average AHI 0.6 with CPAP 15 cm H2O   Social History:  She  reports that she has never smoked. She has never used smokeless tobacco. She reports that she does not drink alcohol and does not use drugs.  Family History:  Her family history includes  Arthritis in an other family member; Asthma in her mother; Breast cancer in an other family member; Colon cancer in her paternal grandfather; Coronary artery disease in an other family member; Diabetes in an other family member; Ovarian cancer in her sister; Prostate cancer in an other family member.     Assessment/Plan:   Obstructive sleep apnea. - she is compliant with CPAP and reports benefit from therapy - she uses [employer] for her DME - continue CPAP 15 cm H2O  Obesity. - she is aware of how her weight can impact her health, particular in relation to her sleep apnea  Time Spent Involved in Patient Care on Day of Examination:  23 minutes  Follow up:  Patient Instructions  Follow up in 1 year   Medication List:   Allergies as of 11/09/2020      Reactions   Lisinopril Cough      Medication List       Accurate as of Nov 09, 2020 12:25 PM. If you have any questions, ask your nurse or doctor.        STOP taking these medications   azithromycin 250 MG tablet Commonly known as: ZITHROMAX Stopped by: VChesley Mires MD     TAKE these medications   ALPRAZolam 0.5 MG tablet Commonly known as: XANAX TAKE 1 TABLET  BY MOUTH AT BEDTIME AS NEEDED FOR ANXIETY   atorvastatin 80 MG tablet Commonly known as: LIPITOR Take 1 tablet (80 mg total) by mouth daily.   buPROPion 150 MG 24 hr tablet Commonly known as: Wellbutrin XL Take 1 tablet (150 mg total) by mouth daily.   glipiZIDE 5 MG tablet Commonly known as: GLUCOTROL TAKE 1 TABLET TWICE A DAY BEFORE MEALS What changed: See the new instructions.   losartan-hydrochlorothiazide 50-12.5 MG tablet Commonly known as: HYZAAR Take 1 tablet by mouth daily.   metFORMIN 1000 MG tablet Commonly known as: GLUCOPHAGE TAKE ONE TABLET BY MOUTH TWICE A DAY WITH MEALS   metoprolol tartrate 50 MG tablet Commonly known as: LOPRESSOR TAKE 1 TABLET BY MOUTH 2 TIMES DAILY   omeprazole 40 MG capsule Commonly known as: PRILOSEC TAKE ONE  CAPSULE BY MOUTH TWICE A DAY **NEED FOLLOW UP FOR REFILLS**   ONE TOUCH LANCETS Misc Check blood sugar 1-2 daily for OneTouch Ultra Mini system Dx code: E11.9   ONE TOUCH ULTRA MINI w/Device Kit Use to check blood sugar 1-2 times a day.   OneTouch Ultra test strip Generic drug: glucose blood TEST 1-2 TIMES A DAY   Rybelsus 7 MG Tabs Generic drug: Semaglutide TAKE 1 TABLET DAILY BEFORE BREAKFAST   sertraline 100 MG tablet Commonly known as: ZOLOFT Take 1 tablet (100 mg total) by mouth daily.       Signature:  Chesley Mires, MD [place] Pager - [phone] 11/09/2020, 12:25 PM

## 2020-11-12 ENCOUNTER — Other Ambulatory Visit: Payer: Self-pay

## 2020-11-12 MED ORDER — SERTRALINE HCL 100 MG PO TABS: 100.0000 mg | ORAL_TABLET | Freq: Every day | ORAL | 1 refills | Status: DC

## 2020-11-12 MED ORDER — ATORVASTATIN CALCIUM 80 MG PO TABS: 80.0000 mg | ORAL_TABLET | Freq: Every day | ORAL | 1 refills | Status: DC

## 2020-11-12 MED ORDER — METFORMIN HCL 1000 MG PO TABS: 1.0000 | ORAL_TABLET | Freq: Two times a day (BID) | ORAL | 2 refills | Status: DC

## 2020-11-12 MED ORDER — METOPROLOL TARTRATE 50 MG PO TABS: 50.0000 mg | ORAL_TABLET | Freq: Two times a day (BID) | ORAL | 1 refills | Status: DC

## 2020-11-28 ENCOUNTER — Other Ambulatory Visit: Payer: Self-pay | Admitting: Family Medicine

## 2020-12-09 ENCOUNTER — Other Ambulatory Visit: Payer: Self-pay | Admitting: Family Medicine

## 2020-12-14 NOTE — Telephone Encounter (Signed)
Please advise on patient mychart message   Hello. I have a question. I have to get my tooth  pulled , the far back right tooth, and dentist told me not to wear my Cpack for 2 nights.   is that ok or how do I handle that?

## 2020-12-19 ENCOUNTER — Encounter: Payer: Self-pay | Admitting: Internal Medicine

## 2021-01-04 ENCOUNTER — Encounter: Payer: Self-pay | Admitting: Internal Medicine

## 2021-01-07 NOTE — Telephone Encounter (Signed)
Dr. Halford Chessman please advise on patient mychart message   Hi Dr Halford Chessman Last 2 months I am coughing like I used to before I had known I had sleep apnea.  I'm not sure if its asthma.  I dont have a inhaler.   Also my last visit with you you ask if my mouth gets dry with c pack on. Well these last few weeks yes. I noticed when the water in the machine gets low my mouth is very dry. So I refill the water up . Also under my left breast the lungs I guess, when I touch them it' hurts like soreness. I dont know if cough has to do with my acid reflux or not so I have appointment july 29 to see my Gastro dr.

## 2021-01-08 ENCOUNTER — Encounter: Payer: Self-pay | Admitting: Internal Medicine

## 2021-01-08 ENCOUNTER — Other Ambulatory Visit: Payer: Self-pay

## 2021-01-08 ENCOUNTER — Encounter: Payer: Self-pay | Admitting: Family Medicine

## 2021-01-08 MED ORDER — FLUTICASONE PROPIONATE 50 MCG/ACT NA SUSP: 1.0000 | Freq: Every day | NASAL | 0 refills | Status: DC

## 2021-01-08 MED ORDER — BENZONATATE 200 MG PO CAPS: 200.0000 mg | ORAL_CAPSULE | Freq: Three times a day (TID) | ORAL | 0 refills | Status: DC | PRN

## 2021-01-08 MED ORDER — ALBUTEROL SULFATE HFA 108 (90 BASE) MCG/ACT IN AERS: 2.0000 | INHALATION_SPRAY | Freq: Four times a day (QID) | RESPIRATORY_TRACT | 6 refills | Status: AC | PRN

## 2021-01-08 NOTE — Telephone Encounter (Signed)
Noted  

## 2021-01-09 ENCOUNTER — Ambulatory Visit (INDEPENDENT_AMBULATORY_CARE_PROVIDER_SITE_OTHER): Payer: Commercial Managed Care - PPO | Admitting: Family Medicine

## 2021-01-09 ENCOUNTER — Encounter: Payer: Self-pay | Admitting: Family Medicine

## 2021-01-09 VITALS — BP 98/62 | HR 81 | Temp 98.6°F | Wt 222.0 lb

## 2021-01-09 DIAGNOSIS — J45909 Unspecified asthma, uncomplicated: Secondary | ICD-10-CM | POA: Insufficient documentation

## 2021-01-09 DIAGNOSIS — F419 Anxiety disorder, unspecified: Secondary | ICD-10-CM

## 2021-01-09 DIAGNOSIS — F32A Depression, unspecified: Secondary | ICD-10-CM | POA: Diagnosis not present

## 2021-01-09 DIAGNOSIS — J4540 Moderate persistent asthma, uncomplicated: Secondary | ICD-10-CM | POA: Diagnosis not present

## 2021-01-09 MED ORDER — VENLAFAXINE HCL ER 75 MG PO CP24: 75.0000 mg | ORAL_CAPSULE | Freq: Every day | ORAL | 2 refills | Status: DC

## 2021-01-09 NOTE — Progress Notes (Signed)
   Subjective:    Patient ID: Morgan [name], female    DOB: 1965/09/05, 55 y.o.   MRN: [id number]  HPI Here for several issues. First she has had a dry cough and wheezing for the past 3 weeks. No chest pain or fever. She had been given an albuterol inhaler by Dr. Halford Chessman to try, but she has not picked this up yet. She asks me to check if she has an infection or not. Also her depression and anxiety have been more of a problem lately. She has been on Zoloft 100 mg a day for a year or more, but she says it does not seem to help like it used to. We also added Wellbutrin to this last month, but she stopped taking it after 3 weeks because it did not help either. Her appetite and sleep are intact.    Review of Systems  Constitutional: Negative.   Eyes: Negative.   Respiratory:  Positive for cough, shortness of breath and wheezing. Negative for choking.   Cardiovascular: Negative.   Psychiatric/Behavioral:  Positive for decreased concentration and dysphoric mood. Negative for agitation, behavioral problems, confusion, hallucinations, self-injury, sleep disturbance and suicidal ideas. The patient is nervous/anxious.       Objective:   Physical Exam Constitutional:      Appearance: Normal appearance. She is not ill-appearing.  HENT:     Right Ear: Tympanic membrane, ear canal and external ear normal.     Left Ear: Tympanic membrane, ear canal and external ear normal.     Nose: Nose normal.     Mouth/Throat:     Pharynx: Oropharynx is clear.  Eyes:     Conjunctiva/sclera: Conjunctivae normal.  Cardiovascular:     Rate and Rhythm: Normal rate and regular rhythm.     Pulses: Normal pulses.     Heart sounds: Normal heart sounds.  Pulmonary:     Effort: Pulmonary effort is normal. No respiratory distress.     Breath sounds: No stridor. No rhonchi or rales.     Comments: Soft scattered wheezes throughout  Lymphadenopathy:     Cervical: No cervical adenopathy.  Neurological:     General:  No focal deficit present.     Mental Status: She is alert and oriented to person, place, and time.  Psychiatric:        Mood and Affect: Mood normal.        Behavior: Behavior normal.          Assessment & Plan:  Her asthma has been acting up a bit lately, so I encouraged her to pick up the albuterol inhaler and to use it as needed. I do not think she has any type of infection currently. For the depression and anxiety, we will stop the Zoloft and try Effexor XR 75 mg daily. Recheck in 3-4 weeks. We spent 35 minutes reviewing records and discussing these issues.  [name], MD

## 2021-01-10 ENCOUNTER — Encounter: Payer: Self-pay | Admitting: Family Medicine

## 2021-01-13 ENCOUNTER — Encounter: Payer: Self-pay | Admitting: Family Medicine

## 2021-01-14 MED ORDER — BUDESONIDE-FORMOTEROL FUMARATE 80-4.5 MCG/ACT IN AERO: 2.0000 | INHALATION_SPRAY | Freq: Two times a day (BID) | RESPIRATORY_TRACT | 12 refills | Status: DC

## 2021-01-14 NOTE — Telephone Encounter (Signed)
Noted  

## 2021-01-14 NOTE — Telephone Encounter (Signed)
Dr. Halford Chessman please advise on the following My Chart message:     I'm taking the inhaler and the cough pill and nasal spray he had for me , I am still coughing alot. But I'm not wheezing like before. When I'm home watching tv I put my cpack on and I never cough. When I take it off I start again. When I lay on my left side I cough nonstop. What is the problem? I need something stronger you think? Tell me what I need to do. I feel better with mask on. I had to get a bigger different size mask Thursday it works better. But I dont want to sit on my couch and wear it watching tv!  Thank you

## 2021-01-15 ENCOUNTER — Telehealth: Payer: Self-pay | Admitting: Pulmonary Disease

## 2021-01-15 MED ORDER — FLUTICASONE FUROATE-VILANTEROL 100-25 MCG/INH IN AEPB: 1.0000 | INHALATION_SPRAY | Freq: Every day | RESPIRATORY_TRACT | 6 refills | Status: AC

## 2021-01-15 NOTE — Telephone Encounter (Signed)
Called and spoke with patient, she states she called her insurance company to get the inhalers on the preferred list that are comparable to the Symbicort 80.  Her insurance will cover:  advair  breo ellipita  Airduo  Dr. Halford Chessman, please advise which inhaler you would like sent to her pharmacy and what strength.  Thank you.

## 2021-01-15 NOTE — Telephone Encounter (Signed)
Rx for Breo 100 has been sent to preferred pharmacy.  Patient is aware and voiced her understanding.  Nothing further needed.

## 2021-01-15 NOTE — Telephone Encounter (Signed)
Lm for patient.  

## 2021-01-15 NOTE — Telephone Encounter (Signed)
Pt returning a phone call. Pt can be reached at [phone]. Pt stated that she has spoke with her insurance and they stated that VS needed to send in the prior auth for the Symbicort. Fax number is [phone] for Symbicort 80.

## 2021-01-15 NOTE — Telephone Encounter (Signed)
Spoke to patient, who stated that Symbicort is not affordable for her. I have recommended that she contact insurance and request a list of cheaper alternative to Symbicort.  She reports of persistent cough, chest congestion and wheezing. She occasionally produces small amounts of yellow sputum. Sx have been present for 1 month.  Denies fever, chills or sweats Using albuterol Q6H and tessalon TID with no relief in sx. Not currently taking abx or prednisone.  Patient is requesting additional recommendations.   Dr. Halford Chessman, please advise. Thanks

## 2021-01-15 NOTE — Telephone Encounter (Signed)
Spoke to patient, who stated that per insurance symbicort needs PA. According to our records, patient has not tried and failed other inhalers, therefore PA will not be approved. I have relayed this message to patient and suggested that she call insurance company back and request a list of covered alternatives.  She will call back with list.

## 2021-01-15 NOTE — Telephone Encounter (Signed)
She needs to check with her insurance medication formulary to see what inhaler options are covered and let us know.  We can then send in a script for an inhaler she can use.  In the meantime, can we provider with a sample of breztri?

## 2021-01-15 NOTE — Telephone Encounter (Signed)
Can send script for breo 100 one puff daily.

## 2021-01-15 NOTE — Telephone Encounter (Signed)
Noted  

## 2021-01-16 ENCOUNTER — Other Ambulatory Visit: Payer: Self-pay | Admitting: Internal Medicine

## 2021-01-17 ENCOUNTER — Other Ambulatory Visit: Payer: Self-pay

## 2021-01-17 MED ORDER — ATORVASTATIN CALCIUM 80 MG PO TABS: 80.0000 mg | ORAL_TABLET | Freq: Every day | ORAL | 1 refills | Status: DC

## 2021-01-17 MED ORDER — ALPRAZOLAM 0.5 MG PO TABS: 0.5000 mg | ORAL_TABLET | Freq: Every evening | ORAL | 5 refills | Status: DC | PRN

## 2021-01-17 MED ORDER — LOSARTAN POTASSIUM-HCTZ 50-12.5 MG PO TABS: 1.0000 | ORAL_TABLET | Freq: Every day | ORAL | 1 refills | Status: DC

## 2021-01-17 MED ORDER — METOPROLOL TARTRATE 50 MG PO TABS
50.0000 mg | ORAL_TABLET | Freq: Two times a day (BID) | ORAL | 1 refills | Status: DC
Start: 2021-01-17 — End: 2021-08-26

## 2021-01-17 MED ORDER — OMEPRAZOLE 40 MG PO CPDR: DELAYED_RELEASE_CAPSULE | ORAL | 0 refills | Status: DC

## 2021-01-20 ENCOUNTER — Other Ambulatory Visit: Payer: Self-pay | Admitting: Gastroenterology

## 2021-01-21 ENCOUNTER — Telehealth: Payer: Self-pay | Admitting: Primary Care

## 2021-01-21 NOTE — Telephone Encounter (Signed)
Pt sent a message via MyChart stating " COUGHING NON STOP INHALER NOT WORKING!!!!!!!!!!!!!!!!!"   Pt is currently scheduled for an appointment with Nurse Volanda Napoleon for 02/06/2021.  Pls regard; [phone]

## 2021-01-21 NOTE — Telephone Encounter (Signed)
Called patient but she did not answer. Left message for her to call us back.  

## 2021-01-24 NOTE — Telephone Encounter (Signed)
Called and spoke to pt. Pt states she is coughing but she has noticed the Memory Dance is working and is fine waiting until the 8/10 appt. Pt aware to call back if she needs a sooner appt. Nothing further needed at this time.

## 2021-01-25 ENCOUNTER — Other Ambulatory Visit: Payer: Self-pay

## 2021-01-25 ENCOUNTER — Encounter: Payer: Self-pay | Admitting: Physician Assistant

## 2021-01-25 ENCOUNTER — Ambulatory Visit (INDEPENDENT_AMBULATORY_CARE_PROVIDER_SITE_OTHER): Payer: Commercial Managed Care - PPO | Admitting: Physician Assistant

## 2021-01-25 VITALS — BP 112/80 | HR 76 | Ht 64.0 in | Wt 221.6 lb

## 2021-01-25 DIAGNOSIS — K219 Gastro-esophageal reflux disease without esophagitis: Secondary | ICD-10-CM

## 2021-01-25 DIAGNOSIS — R059 Cough, unspecified: Secondary | ICD-10-CM

## 2021-01-25 MED ORDER — OMEPRAZOLE 40 MG PO CPDR: 40.0000 mg | DELAYED_RELEASE_CAPSULE | Freq: Two times a day (BID) | ORAL | 0 refills | Status: DC

## 2021-01-25 MED ORDER — OMEPRAZOLE 40 MG PO CPDR: 40.0000 mg | DELAYED_RELEASE_CAPSULE | Freq: Two times a day (BID) | ORAL | 3 refills | Status: DC

## 2021-01-25 NOTE — Progress Notes (Signed)
Subjective:    Patient ID: Morgan [name], female    DOB: Dec 26, 1965, 55 y.o.   MRN: [id number]  HPI  Morgan [name] is a pleasant 55 year old white female, established with Dr. Havery Moros.  She comes in today for refills on omeprazole and with concerns about an increase in symptoms over the past 2 months with persistent coughing and gagging.  She is felt to have chronic GERD, also with hypertension, obesity, sleep apnea, adult onset diabetes mellitus, anxiety/depression. She relates that she has had a fairly constant sensation that something is in her throat over the past couple of months that makes her want to cough.  She has had hard coughing at times to the point of gagging.  She is having symptoms during the day and at nighttime.  She says she is unable to get comfortable at night lying on her left side because it makes it harder for her to breathe but on the right side she seems to be more comfortable.  She denies any dysphagia or odynophagia.  She has never had any heartburn or indigestion type symptoms.  She has noticed an increase in sputum production. She is also being worked up by [employer]. Halford Chessman who she saw her recently and has been started on albuterol and Breo both of which she says seem to be helping.  She says she is not sure whether she has been diagnosed with asthma or not.  Does have pulmonary follow-up. She has not had prior EGD but did undergo barium swallow March 2017 which showed normal pharyngeal motion with swallowing no laryngeal penetration or aspiration no esophageal webs strictures or diverticuli.  Normal esophageal motility no intrinsic or extrinsic lesions noted there is a small hiatal hernia and demonstrated episodes of spontaneous GE reflux. Patient has been on Rybelsus which is helping quite a bit with weight loss she says she is lost about 40 pounds this year, and is continuing efforts to lose more weight. Patient does have history of multiple adenomatous colon  polyps had colonoscopy in September 2020 with 8 polyps removed all of which were tubular adenomas and is indicated for follow-up in September 2023.  She was also noted to have a few left colon diverticuli and internal hemorrhoids.  Review of Systems.Pertinent positive and negative review of systems were noted in the above HPI section.  All other review of systems was otherwise negative.   Outpatient Encounter Medications as of 01/25/2021  Medication Sig   albuterol (VENTOLIN HFA) 108 (90 Base) MCG/ACT inhaler Inhale 2 puffs into the lungs every 6 (six) hours as needed for wheezing or shortness of breath.   ALPRAZolam (XANAX) 0.5 MG tablet Take 1 tablet (0.5 mg total) by mouth at bedtime as needed for anxiety.   atorvastatin (LIPITOR) 80 MG tablet Take 1 tablet (80 mg total) by mouth daily.   Blood Glucose Monitoring Suppl (ONE TOUCH ULTRA MINI) w/Device KIT Use to check blood sugar 1-2 times a day.   fluticasone furoate-vilanterol (BREO ELLIPTA) 100-25 MCG/INH AEPB Inhale 1 puff into the lungs daily.   glipiZIDE (GLUCOTROL) 5 MG tablet TAKE 1 TABLET TWICE A DAY BEFORE MEALS (Patient taking differently: 5 mg daily.)   losartan-hydrochlorothiazide (HYZAAR) 50-12.5 MG tablet Take 1 tablet by mouth daily.   metFORMIN (GLUCOPHAGE) 1000 MG tablet Take 1 tablet (1,000 mg total) by mouth 2 (two) times daily with a meal.   metoprolol tartrate (LOPRESSOR) 50 MG tablet Take 1 tablet (50 mg total) by mouth 2 (two) times daily.  omeprazole (PRILOSEC) 40 MG capsule Take 1 capsule (40 mg total) by mouth 2 (two) times daily.   ONE TOUCH LANCETS MISC Check blood sugar 1-2 daily for OneTouch Ultra Mini system Dx code: E11.9   ONETOUCH ULTRA test strip TEST 1-2 TIMES A DAY   RYBELSUS 7 MG TABS TAKE 1 TABLET DAILY BEFORE BREAKFAST   venlafaxine XR (EFFEXOR XR) 75 MG 24 hr capsule Take 1 capsule (75 mg total) by mouth daily with breakfast.   [DISCONTINUED] omeprazole (PRILOSEC) 40 MG capsule Please keep July 29  appointment for further refills   omeprazole (PRILOSEC) 40 MG capsule Take 1 capsule (40 mg total) by mouth 2 (two) times daily.   [DISCONTINUED] benzonatate (TESSALON) 200 MG capsule Take 1 capsule (200 mg total) by mouth 3 (three) times daily as needed for cough.   [DISCONTINUED] fluticasone (FLONASE) 50 MCG/ACT nasal spray Place 1 spray into both nostrils daily.   No facility-administered encounter medications on file as of 01/25/2021.   Allergies  Allergen Reactions   Lisinopril Cough   Patient Active Problem List   Diagnosis Date Noted   Asthma 01/09/2021   Vitamin B12 deficiency 09/05/2020   Dyslipidemia 09/04/2020   Neuropathy 09/04/2020   Mixed dyslipidemia 09/04/2020   COVID-19 virus infection 06/19/2020   Type 2 diabetes mellitus with diabetic polyneuropathy, without long-term current use of insulin ([place]) 08/25/2019   Type 2 diabetes mellitus with hyperglycemia, without long-term current use of insulin ([place]) 02/17/2019   Anxiety and depression 02/17/2019   BMI 45.0-49.9, adult ([place]) 02/04/2019   Ringing in ears, bilateral 12/29/2017   OSA on CPAP 01/06/2017   Witnessed episode of apnea 07/03/2016   Diverticulitis of colon 11/30/2014   ADHD (attention deficit hyperactivity disorder) 09/09/2013   IBS (irritable bowel syndrome) 04/21/2013   Chronic cough 11/26/2011   External hemorrhoids 04/04/2010   UNSPECIFIED THROMBOSED HEMORRHOIDS 01/08/2009   GERD 08/23/2008   Diabetes mellitus without complication ([place]) 53/61/4431   Hyperlipidemia 01/22/2007   Essential hypertension 01/22/2007   Social History   Socioeconomic History   Marital status: Single    Spouse name: Not on file   Number of children: Not on file   Years of education: Not on file   Highest education level: Not on file  Occupational History   Occupation: billing     Employer: [employer]  Tobacco Use   Smoking status: Never   Smokeless tobacco: Never  Vaping Use   Vaping Use: Never used   Substance and Sexual Activity   Alcohol use: No    Alcohol/week: 0.0 standard drinks   Drug use: No   Sexual activity: Not on file  Other Topics Concern   Not on file  Social History Narrative   Originally from [place], [place], & moved to [place] at 55 y.o. She reports she started a new job in July 2016. 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 No pets currently. No bird, mold, or hot tub exposure.    Social Determinants of Health   Financial Resource Strain: Not on file  Food Insecurity: Not on file  Transportation Needs: Not on file  Physical Activity: Not on file  Stress: Not on file  Social Connections: Not on file  Intimate Partner Violence: Not on file    Ms. Voth's family history includes Arthritis in an other  family member; Asthma in her mother; Colon cancer in her paternal grandfather; Coronary artery disease in an other family member; Diabetes in her brother, brother, and maternal grandfather; Ovarian cancer in her sister; Prostate cancer in an other family member.      Objective:    Vitals:   01/25/21 0907  BP: 112/80  Pulse: 76    Physical Exam Well-developed well-nourished f WF  in no acute distress.  Height, Weight,221  BMI 38.04   HEENT; nontraumatic normocephalic, EOMI, PE R LA, sclera anicteric.  Neuro/Psych; alert and oriented x4, grossly nonfocal mood and affect appropriate        Assessment & Plan:   #48 55 year old white female with history of GERD, maintained on omeprazole 40 mg p.o. twice daily, with 59-monthhistory of very frequent coughing and gagging and a sensation of fullness or irritation in her throat that makes her want to cough.  She has no dysphagia or odynophagia. She has previously demonstrated GE reflux on barium swallow in 2017, has not had prior EGD. She does have history of sleep  apnea and has recently been evaluated by pulmonary, and has been started on albuterol and Breo for recent flare of asthma.  This has improved but has not eliminated the coughing and gagging. Is not clear to me that her coughing is secondary to reflux but certainly may have overlap.  2.  IBS 3.  Constipation stable 4.  History of multiple adenomatous colon polyps-up-to-date with colonoscopy will be due for follow-up September 2023 5.  Obesity-on Rybelsus with 40 pound weight loss thus far 6.  Diverticulosis 7.  Hypertension 8.  History of melanoma  Plan; will continue omeprazole 40 mg instructed patient to take 1 p.o. AC breakfast and second dose AC dinner rather than bedtime. We discussed a strict antireflux regimen and she was given a copy of antireflux diet and precautions.  She has frequently been eating and then lying down or eating later into the evening and then going to bed.  We discussed importance of n.p.o. for 3 hours prior to bedtime and elevation of her back about 45 degrees for sleep which will also help improve daytime symptoms.  I discussed possible EGD.  As she has had some improvement in symptoms with asthma medication I am okay with her monitoring symptoms over the next couple of months.  If she does not have continued improvement with asthma regimen and tighter control of reflux with antireflux regimen then EGD will be indicated with Dr. AHavery Moros Plan follow-up colonoscopy September 2023.  Yuri Fana SGenia HaroldPA-C 01/25/2021   Cc: FLaurey Morale MD

## 2021-01-25 NOTE — Patient Instructions (Addendum)
We have sent the following medications to your pharmacy for you to pick up at your convenience: Omeprazole 40 mg twice daily 30-60 minutes before breakfast and dinner.   Continue to work on weight loss.  Tighten up anti-reflux regimen.  Nothing to eat for 3 hours prior to bedtime, upright after eating.   Call back in 2 months if symptoms persist, ask for [facility].  If you are age 57 or older, your body mass index should be between 23-30. Your Body mass index is 38.04 kg/m. If this is out of the aforementioned range listed, please consider follow up with your Primary Care Provider.  If you are age 50 or younger, your body mass index should be between 19-25. Your Body mass index is 38.04 kg/m. If this is out of the aformentioned range listed, please consider follow up with your Primary Care Provider.   __________________________________________________________  The [facility] providers would like to encourage you to use [facility] to communicate with providers for non-urgent requests or questions.  Due to long hold times on the telephone, sending your provider a message by [facility] may be a faster and more efficient way to get a response.  Please allow 48 business hours for a response.  Please remember that this is for non-urgent requests.

## 2021-01-28 ENCOUNTER — Encounter: Payer: Self-pay | Admitting: Family Medicine

## 2021-01-29 ENCOUNTER — Encounter: Payer: Self-pay | Admitting: Family Medicine

## 2021-01-29 NOTE — Progress Notes (Signed)
Agree with assessment and plan as outlined.  

## 2021-01-29 NOTE — Telephone Encounter (Signed)
This sounds like allergies, so she can take 1 or 2 tablets of Benadryl at bedtime

## 2021-01-30 NOTE — Telephone Encounter (Signed)
Set up a virtual OV  

## 2021-02-01 ENCOUNTER — Telehealth: Payer: Self-pay | Admitting: Pulmonary Disease

## 2021-02-01 ENCOUNTER — Ambulatory Visit: Payer: Commercial Managed Care - PPO

## 2021-02-01 DIAGNOSIS — R0602 Shortness of breath: Secondary | ICD-10-CM

## 2021-02-01 NOTE — Telephone Encounter (Signed)
When did her symptoms first start? She can get CXR prior to visit, would need to be done at one of the hospitals I believe. She should take mucinex '1200mg'$  twice a day. She should also get pulse oximeter to monitor her HR and O2 levels. If O2 <88% needs ED evaluation. If symptoms worsen call office.

## 2021-02-01 NOTE — Telephone Encounter (Signed)
Called spoke with patient. She tested positive for covid on 01/29/21 symptoms are coughing bad, congestion, runny nose, no headache no fever no pulse ox to check oxygen. No tylenol or ibuprofen.  Patient has appt on the 10th which is 1 day out of the 10 day wait to come back in office willing to do a video visit but wants a chest xray. UC wouldn't do when she went there Wednesday.   Sending to [facility] for recommendations

## 2021-02-01 NOTE — Telephone Encounter (Signed)
See telephone encounter for patient she called also

## 2021-02-01 NOTE — Telephone Encounter (Signed)
She tested positive Tuesday night and went to urgent care to confirm on Wednesday. She originally thought it was Pneumonia. Instructed to obtain pulse oximeter and mucinex. Voiced understanding. Educated on oxygen levels and what to monitor for. Denies fever, chills, or body aches.   Aware to go to ED if symptoms worsen. CXR order placed for [facility]. Patient aware.   Nothing further needed at this time.

## 2021-02-02 ENCOUNTER — Encounter: Payer: Self-pay | Admitting: Internal Medicine

## 2021-02-03 ENCOUNTER — Telehealth: Payer: Self-pay | Admitting: Internal Medicine

## 2021-02-03 NOTE — Telephone Encounter (Signed)
Non fasting blood sugar 339 p starting breo and breathing fine now  Rec d/c breo for now Albuterol q 4 h prn  Check fasting sugar in am and contact dm doc for further instructions

## 2021-02-04 ENCOUNTER — Encounter: Payer: Self-pay | Admitting: Internal Medicine

## 2021-02-04 ENCOUNTER — Other Ambulatory Visit: Payer: Self-pay

## 2021-02-04 ENCOUNTER — Encounter: Payer: Self-pay | Admitting: *Deleted

## 2021-02-04 ENCOUNTER — Ambulatory Visit (HOSPITAL_COMMUNITY)
Admission: RE | Admit: 2021-02-04 | Discharge: 2021-02-04 | Disposition: A | Discharge status: Home / Self Care | Payer: Commercial Managed Care - PPO | Source: Ambulatory Visit | Attending: Primary Care | Admitting: Primary Care

## 2021-02-04 DIAGNOSIS — R0602 Shortness of breath: Secondary | ICD-10-CM | POA: Diagnosis present

## 2021-02-04 NOTE — Telephone Encounter (Signed)
Spoke with the pt  Advised needs appt  She is already scheduled for 02/06/21  Nothing further needed

## 2021-02-04 NOTE — Progress Notes (Signed)
CXR showed no active cardiopulmonary disease, lungs were clear.

## 2021-02-04 NOTE — Telephone Encounter (Signed)
Please advise 

## 2021-02-06 ENCOUNTER — Ambulatory Visit (INDEPENDENT_AMBULATORY_CARE_PROVIDER_SITE_OTHER): Payer: Commercial Managed Care - PPO | Admitting: Primary Care

## 2021-02-06 ENCOUNTER — Encounter: Payer: Self-pay | Admitting: Family Medicine

## 2021-02-06 ENCOUNTER — Other Ambulatory Visit: Payer: Self-pay

## 2021-02-06 ENCOUNTER — Telehealth: Payer: Commercial Managed Care - PPO | Admitting: Family Medicine

## 2021-02-06 ENCOUNTER — Encounter: Payer: Self-pay | Admitting: Primary Care

## 2021-02-06 VITALS — BP 110/78 | HR 71 | Temp 97.8°F | Ht 64.0 in | Wt 217.8 lb

## 2021-02-06 DIAGNOSIS — R053 Chronic cough: Secondary | ICD-10-CM | POA: Diagnosis not present

## 2021-02-06 DIAGNOSIS — J4520 Mild intermittent asthma, uncomplicated: Secondary | ICD-10-CM

## 2021-02-06 MED ORDER — IPRATROPIUM BROMIDE 0.06 % NA SOLN: 2.0000 | Freq: Three times a day (TID) | NASAL | 3 refills | Status: DC

## 2021-02-06 NOTE — Assessment & Plan Note (Addendum)
-   She has had a persistent cough x1 month which is worse at night. Appears to have improved on it's own. Likely component of underlying PND and GERD symptoms contributing to her cough. She had a recent CXR that appeared normal. She saw no improvement with low dose BREO and was ultimately discontinued d/t hyperglycemia.  Advised she restart ipratropium nasal spray TID and continue Omepraole '40mg'$  BID along with GERD diet. Recommend we get repeat PFTs with [facility] at next visit.

## 2021-02-06 NOTE — Progress Notes (Signed)
'@Patient'  ID: Morgan [name], female    DOB: 1965/08/28, 55 y.o.   MRN: [id number]  Chief Complaint  Patient presents with   Follow-up    Patient reports cough is better,     Referring provider: Laurey Morale, MD  HPI: 55 year old female, never smoked.  Past medical history significant for OSA on CPAP.  Patient of Dr. Halford Chessman, last seen in office on 11/04/2019.   02/06/2021- interim hx  Patient presents today for acute visit/cough. Patient called our office in early July with reports of persistent cough. Her cough has improved, reports that it is worse at night when laying flat. She previous was sent in low dose ICS/LABA inhaler which she reports did not help. BREO 100 was stopped d/t elevated glucose in 300s.  She has been using albuterol rescue inhaler every 6 hours as needed for cough. Her O2 at home has been ranging from 93-96% RA. CXR 02/04/21 showed no acute process. She has hx GERD and is compliant with omeprazole 72m twice daily. She is not currently using any nasal sprays. She is taking mucinex as needed,    Allergies  Allergen Reactions   Lisinopril Cough    Immunization History  Administered Date(s) Administered   Influenza Split 05/01/2011, 09/05/2013   Influenza,inj,Quad PF,6+ Mos 05/27/2017, 04/20/2018   Influenza-Unspecified 07/03/2016   Moderna Sars-Covid-2 Vaccination 09/22/2019, 10/20/2019, 01/19/2021    Past Medical History:  Diagnosis Date   Anal fissure    saw Dr. DMercy Riding   Anxiety    Arthritis    Depression    Diabetes mellitus    GERD (gastroesophageal reflux disease)    History of ovarian cancer in adulthood    Hyperlipidemia    Hypertension    [place]    [place]    Sleep apnea    uses CPAP    Tubular adenoma of colon 01/2014    Tobacco History: Social History   Tobacco Use  Smoking Status Never  Smokeless Tobacco Never   Counseling given: Not Answered   Outpatient Medications Prior to  Visit  Medication Sig Dispense Refill   albuterol (VENTOLIN HFA) 108 (90 Base) MCG/ACT inhaler Inhale 2 puffs into the lungs every 6 (six) hours as needed for wheezing or shortness of breath. 8 g 6   ALPRAZolam (XANAX) 0.5 MG tablet Take 1 tablet (0.5 mg total) by mouth at bedtime as needed for anxiety. 30 tablet 5   amoxicillin-clavulanate (AUGMENTIN) 875-125 MG tablet Take 1 tablet by mouth 2 (two) times daily.     atorvastatin (LIPITOR) 80 MG tablet Take 1 tablet (80 mg total) by mouth daily. 90 tablet 1   Blood Glucose Monitoring Suppl (ONE TOUCH ULTRA MINI) w/Device KIT Use to check blood sugar 1-2 times a day. 1 kit 0   glipiZIDE (GLUCOTROL) 5 MG tablet TAKE 1 TABLET TWICE A DAY BEFORE MEALS (Patient taking differently: Take 5 mg by mouth at bedtime.) 180 tablet 3   losartan-hydrochlorothiazide (HYZAAR) 50-12.5 MG tablet Take 1 tablet by mouth daily. 90 tablet 1   metFORMIN (GLUCOPHAGE) 1000 MG tablet Take 1 tablet (1,000 mg total) by mouth 2 (two) times daily with a meal. 180 tablet 2   metoprolol tartrate (LOPRESSOR) 50 MG tablet Take 1 tablet (50 mg total) by mouth 2 (two) times daily. 180 tablet 1   omeprazole (PRILOSEC) 40 MG capsule Take 1 capsule (40 mg total) by mouth 2 (two) times daily. 60 capsule 0  ONE TOUCH LANCETS MISC Check blood sugar 1-2 daily for OneTouch Ultra Mini system Dx code: E11.9 200 each 3   ONETOUCH ULTRA test strip TEST 1-2 TIMES A DAY 50 strip 2   predniSONE (DELTASONE) 20 MG tablet Take 20 mg by mouth 2 (two) times daily.     RYBELSUS 7 MG TABS TAKE 1 TABLET DAILY BEFORE BREAKFAST 90 tablet 3   venlafaxine XR (EFFEXOR XR) 75 MG 24 hr capsule Take 1 capsule (75 mg total) by mouth daily with breakfast. 30 capsule 2   ipratropium (ATROVENT) 0.06 % nasal spray Place into the nose.     fluticasone furoate-vilanterol (BREO ELLIPTA) 100-25 MCG/INH AEPB Inhale 1 puff into the lungs daily. (Patient not taking: Reported on 02/06/2021) 60 each 6   benzonatate (TESSALON)  100 MG capsule 100 mg. (Patient not taking: Reported on 02/06/2021)     omeprazole (PRILOSEC) 40 MG capsule Take 1 capsule (40 mg total) by mouth 2 (two) times daily. (Patient not taking: Reported on 02/06/2021) 180 capsule 3   No facility-administered medications prior to visit.    Review of Systems  Review of Systems  Constitutional: Negative.   HENT:  Positive for postnasal drip.   Respiratory:  Positive for cough. Negative for chest tightness, shortness of breath and wheezing.     Physical Exam  BP 110/78 (BP Location: Left Arm, Patient Position: Sitting, Cuff Size: Normal)   Pulse 71   Temp 97.8 F (36.6 C) (Oral)   Ht '5\' 4"'  (1.626 m)   Wt 217 lb 12.8 oz (98.8 kg)   LMP 03/22/1986   SpO2 97%   BMI 37.39 kg/m  Physical Exam Constitutional:      Appearance: Normal appearance.  HENT:     Head: Normocephalic and atraumatic.     Nose: Congestion present. No nasal tenderness.     Right Sinus: No maxillary sinus tenderness or frontal sinus tenderness.     Left Sinus: No frontal sinus tenderness.     Mouth/Throat:     Mouth: Mucous membranes are moist.     Pharynx: Oropharynx is clear.  Cardiovascular:     Rate and Rhythm: Normal rate and regular rhythm.  Pulmonary:     Effort: Pulmonary effort is normal.     Breath sounds: No wheezing, rhonchi or rales.  Neurological:     Mental Status: She is alert.     Lab Results:  CBC    Component Value Date/Time   WBC 9.6 10/17/2019 1135   RBC 4.15 10/17/2019 1135   HGB 11.9 (L) 10/17/2019 1135   HCT 37.7 10/17/2019 1135   PLT 333 10/17/2019 1135   MCV 90.8 10/17/2019 1135   MCH 28.7 10/17/2019 1135   MCHC 31.6 10/17/2019 1135   RDW 15.4 10/17/2019 1135   LYMPHSABS 2.5 10/17/2019 1135   MONOABS 0.6 10/17/2019 1135   EOSABS 0.1 10/17/2019 1135   BASOSABS 0.0 10/17/2019 1135    BMET    Component Value Date/Time   NA 141 09/04/2020 1536   K 3.6 09/04/2020 1536   CL 102 09/04/2020 1536   CO2 26 09/04/2020 1536    GLUCOSE 91 09/04/2020 1536   BUN 16 09/04/2020 1536   CREATININE 0.91 09/04/2020 1536   CREATININE 0.83 02/22/2020 1616   CALCIUM 9.6 09/04/2020 1536   GFRNONAA >60 10/17/2019 1135   GFRAA >60 10/17/2019 1135    BNP No results found for: BNP  ProBNP No results found for: PROBNP  Imaging: DG Chest 2 View  Result Date: 02/04/2021 CLINICAL DATA:  Shortness of breath. Cough. Covid-19 viral infection. EXAM: CHEST - 2 VIEW COMPARISON:  10/24/2015 FINDINGS: The heart size and mediastinal contours are within normal limits. Both lungs are clear. The visualized skeletal structures are unremarkable. IMPRESSION: No active cardiopulmonary disease. Electronically Signed   By: Marlaine Hind M.D.   On: 02/04/2021 08:20     Assessment & Plan:   Chronic cough - She has had a persistent cough x1 month which is worse at night. Appears to have improved on it's own. Likely component of underlying PND and GERD symptoms contributing to her cough. She had a recent CXR that appeared normal. She saw no improvement with low dose BREO and was ultimately discontinued d/t hyperglycemia.  Advised she restart ipratropium nasal spray TID and continue Omepraole 85m BID along with GERD diet. Recommend we get repeat PFTs with [facility] at next visit.      EMartyn Ehrich NP 02/06/2021

## 2021-02-06 NOTE — Patient Instructions (Addendum)
Recommendations: - Stay off BREO - Continue Albuterol every 6-8 hours for cough - Restart ipratropium nasal spray  - Continue Omeprazole '40mg'$  twice daily and follow GERD diet - Follow up with Gastroenterology   Orders: PFTs    Follow-up: - First available with Dr. Halford Chessman and PFTs    Food Choices for Gastroesophageal Reflux Disease, Adult When you have gastroesophageal reflux disease (GERD), the foods you eat and your eating habits are very important. Choosing the right foods can help ease your discomfort. Think about working with a food expert (dietitian) to help you make good choices. What are tips for following this plan? Reading food labels Look for foods that are low in saturated fat. Foods that may help with your symptoms include: Foods that have less than 5% of daily value (DV) of fat. Foods that have 0 grams of trans fat. Cooking Do not fry your food. Cook your food by baking, steaming, grilling, or broiling. These are all methods that do not need a lot of fat for cooking. To add flavor, try to use herbs that are low in spice and acidity. Meal planning  Choose healthy foods that are low in fat, such as: Fruits and vegetables. Whole grains. Low-fat dairy products. Lean meats, fish, and poultry. Eat small meals often instead of eating 3 large meals each day. Eat your meals slowly in a place where you are relaxed. Avoid bending over or lying down until 2-3 hours after eating. Limit high-fat foods such as fatty meats or fried foods. Limit your intake of fatty foods, such as oils, butter, and shortening. Avoid the following as told by your doctor: Foods that cause symptoms. These may be different for different people. Keep a food diary to keep track of foods that cause symptoms. Alcohol. Drinking a lot of liquid with meals. Eating meals during the 2-3 hours before bed.  Lifestyle Stay at a healthy weight. Ask your doctor what weight is healthy for you. If you need to lose  weight, work with your doctor to do so safely. Exercise for at least 30 minutes on 5 or more days each week, or as told by your doctor. Wear loose-fitting clothes. Do not smoke or use any products that contain nicotine or tobacco. If you need help quitting, ask your doctor. Sleep with the head of your bed higher than your feet. Use a wedge under the mattress or blocks under the bed frame to raise the head of the bed. Chew sugar-free gum after meals. What foods should eat?  Eat a healthy, well-balanced diet of fruits, vegetables, whole grains, low-fatdairy products, lean meats, fish, and poultry. Each person is different. Foods that may cause symptoms in one person may not cause any symptoms inanother person. Work with your doctor to find foods that are safe for you. The items listed above may not be a complete list of what you can eat and drink. Contact a food expert for more options. What foods should I avoid? Limiting some of these foods may help in managing the symptoms of GERD. Everyone is different. Talk with a food expert or your doctor to help you findthe exact foods to avoid, if any. Fruits Any fruits prepared with added fat. Any fruits that cause symptoms. For some people, this may include citrus fruits, such as oranges, grapefruit, pineapple,and lemons. Vegetables Deep-fried vegetables. [place] fries. Any vegetables prepared with added fat. Any vegetables that cause symptoms. For some people, this may include tomatoesand tomato products, chili peppers, onions and garlic,  and horseradish. Grains Pastries or quick breads with added fat. Meats and other proteins High-fat meats, such as fatty beef or pork, hot dogs, ribs, ham, sausage, salami, and bacon. Fried meat or protein, including fried fish and friedchicken. Nuts and nut butters, in large amounts. Dairy Whole milk and chocolate milk. Sour cream. Cream. Ice cream. Cream cheese.Milkshakes. Fats and oils Butter. Margarine.  Shortening. Ghee. Beverages Coffee and tea, with or without caffeine. Carbonated beverages. Sodas. Energy drinks. Fruit juice made with acidic fruits, such as orange or grapefruit.Tomato juice. Alcoholic drinks. Sweets and desserts Chocolate and cocoa. Donuts. Seasonings and condiments Pepper. Peppermint and spearmint. Added salt. Any condiments, herbs, or seasonings that cause symptoms. For some people, this may include curry, hotsauce, or vinegar-based salad dressings. The items listed above may not be a complete list of what you should not eat and drink. Contact a food expert for more options. Questions to ask your doctor Diet and lifestyle changes are often the first steps that are taken to manage symptoms of GERD. If diet and lifestyle changes do not help, talk with yourdoctor about taking medicines. Where to find more information International Foundation for Gastrointestinal Disorders: aboutgerd.org Summary When you have GERD, food and lifestyle choices are very important in easing your symptoms. Eat small meals often instead of 3 large meals a day. Eat your meals slowly and in a place where you are relaxed. Avoid bending over or lying down until 2-3 hours after eating. Limit high-fat foods such as fatty meats or fried foods. This information is not intended to replace advice given to you by your health care provider. Make sure you discuss any questions you have with your healthcare provider. Document Revised: 12/26/2019 Document Reviewed: 12/26/2019 Elsevier Patient Education  [place].

## 2021-02-07 NOTE — Progress Notes (Signed)
Reviewed and agree with assessment/plan.   Chesley Mires, MD [facility] Pulmonary/Critical Care 02/07/2021, 8:55 AM Pager:  [phone]

## 2021-02-07 NOTE — Telephone Encounter (Signed)
Message received for [place], NP.  Dr Volanda Napoleon. You don't need to put a order in for me to have a astma test I. September.  I seem to be doing better. Thank you so much for ur time and caring about me  see u next year.

## 2021-02-11 ENCOUNTER — Telehealth: Payer: Self-pay | Admitting: *Deleted

## 2021-02-11 NOTE — Telephone Encounter (Signed)
That should not have been the cause of her symptoms. I looked in up to date and they did not list out tinnitus as an adverse reactions. I would go to [facility] and see if they can check her Vitals and ENT exam.

## 2021-02-11 NOTE — Telephone Encounter (Signed)
Patient sent email   Dr Volanda Napoleon gave me Ipratropuum Bromide nasal solution.  Work great 3 days, then now my right ear is buzzing nonstop and when I talk there's a loud echo.  Do I stop taking this????? It did work great those days I was taking it.or do I go to urgent care???  Two sprays each nostril for 3 days stopped the nasal spray. Right ear is buzzing constantly and echo when talking. Echo has lessened up since last night.   Patient wants to know if she should stop nasal spray , her nose is running again   Sending to [facility] for recommendations.

## 2021-02-14 ENCOUNTER — Telehealth: Payer: Self-pay | Admitting: Internal Medicine

## 2021-02-14 NOTE — Telephone Encounter (Signed)
MEDICATION: Embrace Meter Test Strips, Lancing Device, & Lancets  PHARMACY:  [facility]  HAS THE PATIENT CONTACTED THEIR PHARMACY?  yes  IS THIS A 90 DAY SUPPLY : yes   IS PATIENT OUT OF MEDICATION: this is a new meter  LAST APPOINTMENT DATE: '@7'$ /20/2022  DO WE HAVE YOUR PERMISSION TO LEAVE A DETAILED MESSAGE?:yes

## 2021-02-15 ENCOUNTER — Encounter: Payer: Self-pay | Admitting: Internal Medicine

## 2021-02-15 NOTE — Telephone Encounter (Signed)
Forms faxed to the company and sent a request to the company

## 2021-02-20 ENCOUNTER — Other Ambulatory Visit: Payer: Self-pay | Admitting: Physician Assistant

## 2021-03-05 ENCOUNTER — Encounter: Payer: Self-pay | Admitting: Family Medicine

## 2021-03-05 ENCOUNTER — Other Ambulatory Visit: Payer: Self-pay

## 2021-03-05 MED ORDER — VENLAFAXINE HCL ER 150 MG PO CP24: ORAL_CAPSULE | ORAL | 3 refills | Status: DC

## 2021-03-05 NOTE — Telephone Encounter (Signed)
Let's try doubling the dose of the Venlafaxine XR to 150 mg daily. Call in #[phone] with 3 rf

## 2021-04-02 ENCOUNTER — Other Ambulatory Visit: Payer: Self-pay | Admitting: Family Medicine

## 2021-04-02 DIAGNOSIS — Z1231 Encounter for screening mammogram for malignant neoplasm of breast: Secondary | ICD-10-CM

## 2021-04-21 DIAGNOSIS — Z9989 Dependence on other enabling machines and devices: Secondary | ICD-10-CM

## 2021-04-21 DIAGNOSIS — G4733 Obstructive sleep apnea (adult) (pediatric): Secondary | ICD-10-CM

## 2021-04-22 ENCOUNTER — Other Ambulatory Visit (HOSPITAL_COMMUNITY): Payer: Self-pay

## 2021-04-22 ENCOUNTER — Telehealth: Payer: Self-pay | Admitting: Pharmacy Technician

## 2021-04-22 NOTE — Telephone Encounter (Signed)
Received the following message from patient:   "Could you please ask Geraldo Pitter or Dr Halford Chessman. Three times I have notice. Especially on Saturday 22nd after midnight around 2am or 3am or 4am I caught myself again choking grasping for air . Felt my mouth wide open. I had a half tank of water when it woke me up. Mouth has been very dry ... in middle of nights alot.  Can you look at the report and see if I stopped breathing  22nd and 23rd dates? What do we need to do? Up the air increase ? It's at 15 now..."  Dr. Halford Chessman, can you please advise? I have faxed a copy of her compliance report to [facility] for you to review. Thanks!

## 2021-04-22 NOTE — Telephone Encounter (Signed)
[facility] Patient Advocate Encounter  Prior Authorization for Rybelsus 7mg  has been approved.    PA#  Case ID # [id number] Effective dates: 03/18/2021 through 04/18/2022  Patients co-pay is unable to determine.  Per Test claim: We are out of network  Per Epic: pt filled 04/15/21 through [facility] for a 90 day supply.  Armanda Magic, CPhT Patient [facility] Phone: [phone] Fax:  [phone]

## 2021-04-22 NOTE — Telephone Encounter (Signed)
CPAP download 03/23/21 to 04/21/21 >> used on 29 of 30 nights with average 6 hrs 36 min.  Average AHI 3.1 with CPAP 15 cm H2O.  Air leak noted.   Please let her know that he CPAP download shows that she has more trouble when her mask leaks more.  Otherwise pressure setting seems to be sufficient.  Please send an order to have [facility] refit her CPAP mask.

## 2021-05-01 ENCOUNTER — Other Ambulatory Visit: Payer: Self-pay

## 2021-05-01 ENCOUNTER — Encounter: Payer: Self-pay | Admitting: Family Medicine

## 2021-05-01 MED ORDER — VENLAFAXINE HCL ER 150 MG PO CP24: ORAL_CAPSULE | ORAL | 2 refills | Status: DC

## 2021-05-03 ENCOUNTER — Other Ambulatory Visit: Payer: Self-pay

## 2021-05-03 ENCOUNTER — Ambulatory Visit
Admission: RE | Admit: 2021-05-03 | Discharge: 2021-05-03 | Disposition: A | Discharge status: Home / Self Care | Payer: Commercial Managed Care - PPO | Source: Ambulatory Visit | Attending: Family Medicine | Admitting: Family Medicine

## 2021-05-03 DIAGNOSIS — Z1231 Encounter for screening mammogram for malignant neoplasm of breast: Secondary | ICD-10-CM

## 2021-05-06 ENCOUNTER — Encounter: Payer: Self-pay | Admitting: Internal Medicine

## 2021-05-06 ENCOUNTER — Encounter: Payer: Self-pay | Admitting: Family Medicine

## 2021-05-06 DIAGNOSIS — E785 Hyperlipidemia, unspecified: Secondary | ICD-10-CM

## 2021-05-07 NOTE — Telephone Encounter (Signed)
I put in the orders for the labs. Yes she will need to schedule a lab appt and yes she needs to be fasting

## 2021-05-09 ENCOUNTER — Encounter: Payer: Self-pay | Admitting: Internal Medicine

## 2021-05-09 ENCOUNTER — Encounter: Payer: Self-pay | Admitting: Podiatry

## 2021-05-09 ENCOUNTER — Other Ambulatory Visit: Payer: Self-pay

## 2021-05-09 ENCOUNTER — Ambulatory Visit (INDEPENDENT_AMBULATORY_CARE_PROVIDER_SITE_OTHER): Payer: Commercial Managed Care - PPO | Admitting: Podiatry

## 2021-05-09 DIAGNOSIS — L603 Nail dystrophy: Secondary | ICD-10-CM | POA: Diagnosis not present

## 2021-05-09 NOTE — Progress Notes (Signed)
Subjective:  Patient ID: Morgan [name], female    DOB: 09-05-1965,  MRN: [id number] HPI Chief Complaint  Patient presents with   Toe Pain    Hallux and 3rd toe left - thick toenails, a lot of pain with pressure, 1st toe was really red around the tip of the toe a few days ago, Endocrinologist checked areas with monofilament and said she had signs of neuropathy   New Patient (Initial Visit)   Diabetes    Last a1c was 5.9     55 y.o. female presents with the above complaint.   ROS: Denies fever chills nausea vomiting muscle aches pains calf pain back pain chest pain shortness of breath.  Past Medical History:  Diagnosis Date   Anal fissure    saw Dr. Mercy Riding    Anxiety    Arthritis    Depression    Diabetes mellitus    GERD (gastroesophageal reflux disease)    History of ovarian cancer in adulthood    Hyperlipidemia    Hypertension    Ovarian cancer ([place])    Skin cancer (melanoma) ([place])    Sleep apnea    uses CPAP    Tubular adenoma of colon 01/2014   Past Surgical History:  Procedure Laterality Date   ABDOMINAL HYSTERECTOMY  06/30/1984   BSO   BASAL CELL CARCINOMA EXCISION  10/09/2012   lower lip per Dr. Link Snuffer    COLONOSCOPY  02/17/2014   per Dr. Olevia Perches, adenomatous polyps, repeat in 5 yrs    GLBS tumor ovary 1986     [place] Right    MELANOMA EXCISION  07/01/1995   upper chest     Current Outpatient Medications:    albuterol (VENTOLIN HFA) 108 (90 Base) MCG/ACT inhaler, Inhale 2 puffs into the lungs every 6 (six) hours as needed for wheezing or shortness of breath., Disp: 8 g, Rfl: 6   ALPRAZolam (XANAX) 0.5 MG tablet, Take 1 tablet (0.5 mg total) by mouth at bedtime as needed for anxiety., Disp: 30 tablet, Rfl: 5   amoxicillin-clavulanate (AUGMENTIN) 875-125 MG tablet, Take 1 tablet by mouth 2 (two) times daily., Disp: , Rfl:    atorvastatin (LIPITOR) 80 MG tablet, Take 1 tablet (80 mg total) by mouth  daily., Disp: 90 tablet, Rfl: 1   Blood Glucose Monitoring Suppl (ONE TOUCH ULTRA MINI) w/Device KIT, Use to check blood sugar 1-2 times a day., Disp: 1 kit, Rfl: 0   diclofenac (VOLTAREN) 75 MG EC tablet, Take 75 mg by mouth 2 (two) times daily., Disp: , Rfl:    glipiZIDE (GLUCOTROL) 5 MG tablet, TAKE 1 TABLET TWICE A DAY BEFORE MEALS (Patient taking differently: Take 5 mg by mouth at bedtime.), Disp: 180 tablet, Rfl: 3   ipratropium (ATROVENT) 0.06 % nasal spray, Place 2 sprays into both nostrils 3 (three) times daily., Disp: 15 mL, Rfl: 3   losartan-hydrochlorothiazide (HYZAAR) 50-12.5 MG tablet, Take 1 tablet by mouth daily., Disp: 90 tablet, Rfl: 1   metFORMIN (GLUCOPHAGE) 1000 MG tablet, Take 1 tablet (1,000 mg total) by mouth 2 (two) times daily with a meal., Disp: 180 tablet, Rfl: 2   metoprolol tartrate (LOPRESSOR) 50 MG tablet, Take 1 tablet (50 mg total) by mouth 2 (two) times daily., Disp: 180 tablet, Rfl: 1   omeprazole (PRILOSEC) 40 MG capsule, Take 1 capsule (40 mg total) by mouth in the morning and at bedtime., Disp: 60 capsule, Rfl: 6   ONE TOUCH  LANCETS MISC, Check blood sugar 1-2 daily for OneTouch Ultra Mini system Dx code: E11.9, Disp: 200 each, Rfl: 3   ONETOUCH ULTRA test strip, TEST 1-2 TIMES A DAY, Disp: 50 strip, Rfl: 2   predniSONE (DELTASONE) 20 MG tablet, Take 20 mg by mouth 2 (two) times daily., Disp: , Rfl:    RYBELSUS 7 MG TABS, TAKE 1 TABLET DAILY BEFORE BREAKFAST, Disp: 90 tablet, Rfl: 3   tiZANidine (ZANAFLEX) 2 MG tablet, Take 2 mg by mouth 3 (three) times daily., Disp: , Rfl:    venlafaxine XR (EFFEXOR-XR) 150 MG 24 hr capsule, Take one tablet by mouth daily., Disp: 30 capsule, Rfl: 2  Allergies  Allergen Reactions   Lisinopril Cough   Review of Systems Objective:  There were no vitals filed for this visit.  General: Well developed, nourished, in no acute distress, alert and oriented x3   Dermatological: Skin is warm, dry and supple bilateral. Nails x 10  are thick yellow dystrophic probably does demonstrate some onychomycosis but I imagine is primarily nail dystrophy.  Remaining integument appears unremarkable at this time. There are no open sores, no preulcerative lesions, no rash or signs of infection present.  Vascular: Dorsalis Pedis artery and Posterior Tibial artery pedal pulses are 2/4 bilateral with immedate capillary fill time. Pedal hair growth present. No varicosities and no lower extremity edema present bilateral.   Neruologic: Grossly intact via light touch bilateral. Vibratory intact via tuning fork bilateral. Protective threshold with Semmes Wienstein monofilament i diminished to all pedal sites bilateral. Patellar and Achilles deep tendon reflexes 2+ bilateral. No Babinski or clonus noted bilateral.   Musculoskeletal: No gross boney pedal deformities bilateral. No pain, crepitus, or limitation noted with foot and ankle range of motion bilateral. Muscular strength 5/5 in all groups tested bilateral.  Gait: Unassisted, Nonantalgic.    Radiographs:  None taken  Assessment & Plan:   Assessment: Diabetic peripheral neuropathy with nail dystrophy.  Plan: Discussed etiology pathology conservative surgical therapies at this point took samples of the toenails to be sent for pathologic evaluation we did discuss nail removal I will follow-up with her in 1 month.     [name] T. [place], [place]

## 2021-05-10 ENCOUNTER — Other Ambulatory Visit (INDEPENDENT_AMBULATORY_CARE_PROVIDER_SITE_OTHER): Payer: Commercial Managed Care - PPO

## 2021-05-10 ENCOUNTER — Encounter: Payer: Self-pay | Admitting: Internal Medicine

## 2021-05-10 ENCOUNTER — Encounter: Payer: Self-pay | Admitting: Family Medicine

## 2021-05-10 ENCOUNTER — Other Ambulatory Visit: Payer: Self-pay

## 2021-05-10 ENCOUNTER — Ambulatory Visit (INDEPENDENT_AMBULATORY_CARE_PROVIDER_SITE_OTHER): Payer: Commercial Managed Care - PPO | Admitting: Internal Medicine

## 2021-05-10 VITALS — BP 110/84 | HR 82 | Ht 64.0 in | Wt 212.0 lb

## 2021-05-10 DIAGNOSIS — E1142 Type 2 diabetes mellitus with diabetic polyneuropathy: Secondary | ICD-10-CM | POA: Diagnosis not present

## 2021-05-10 DIAGNOSIS — R634 Abnormal weight loss: Secondary | ICD-10-CM

## 2021-05-10 DIAGNOSIS — E785 Hyperlipidemia, unspecified: Secondary | ICD-10-CM

## 2021-05-10 LAB — POCT GLYCOSYLATED HEMOGLOBIN (HGB A1C): Hemoglobin A1C: 6.1 % — AB (ref 4.0–5.6)

## 2021-05-10 LAB — LIPID PANEL
Cholesterol: 142 mg/dL (ref 0–200)
HDL: 33.3 mg/dL — ABNORMAL LOW (ref >39.00)
NonHDL: 108.27
Total CHOL/HDL Ratio: 4
Triglycerides: 266 mg/dL — ABNORMAL HIGH (ref 0.0–149.0)
VLDL: 53.2 mg/dL — ABNORMAL HIGH (ref 0.0–40.0)

## 2021-05-10 LAB — HEPATIC FUNCTION PANEL
ALT: 17 U/L (ref 0–35)
AST: 17 U/L (ref 0–37)
Albumin: 4.3 g/dL (ref 3.5–5.2)
Alkaline Phosphatase: 58 U/L (ref 39–117)
Bilirubin, Direct: 0.1 mg/dL (ref 0.0–0.3)
Total Bilirubin: 0.6 mg/dL (ref 0.2–1.2)
Total Protein: 7 g/dL (ref 6.0–8.3)

## 2021-05-10 LAB — LDL CHOLESTEROL, DIRECT: Direct LDL: 66 mg/dL

## 2021-05-10 LAB — T4, FREE: Free T4: 0.86 ng/dL (ref 0.60–1.60)

## 2021-05-10 LAB — TSH: TSH: 1.27 u[IU]/mL (ref 0.35–5.50)

## 2021-05-10 NOTE — Progress Notes (Signed)
Name: Morgan [name]  Age/ Sex: 55 y.o., female   MRN/ DOB: [id number], [birth year]     PCP: Laurey Morale, MD   Reason for Endocrinology Evaluation: Type 2 Diabetes Mellitus  Initial Endocrine Consultative Visit: 02/16/2019    PATIENT IDENTIFIER: Morgan [name] is a 55 y.o. female with a past medical history of HTN, OSA,T2DM,and obesity, Ovarian cancer(S/P surgery )  and melanoma. The patient has followed with Endocrinology clinic since 02/16/2019 for consultative assistance with management of her diabetes.  DIABETIC HISTORY:  [name] was diagnosed with T2DM in 2000. She has been on Metformin, Glipizide and Actos since her diagnosis. Her hemoglobin A1c has ranged from 6.8% in 2018, peaking at 8.3% in 2020.  Rybelsus added in 01/2019 Pioglitazone stopped 05/2019   Normal 24-hr urinary cortisol 08/2020  SUBJECTIVE:   During the last visit (09/04/2020): A1c 5.9 %.  We are continued Metformin , Rybelsus, and decreased glipizide  Today (05/10/2021): Ms. Vahle is here for a  follow up on diabetes management.   She checks her blood sugars occasionally. The patient has had hypoglycemic episodes since the last clinic visit. This happens in the morning .   She has been noted with weight loss - unintentional  Denies nausea or diarrhea but had constipation  Denies palpitations  Has occasional dizziness      HOME DIABETES REGIMEN:  - Metformin 1000 mg ,half a tablet twice a day  - Glipizide 5 mg,1 tablet before supper  - Rybelsus 7 mg daily    METER DOWNLOAD SUMMARY: 10/28-11/04/2021  Average Number Tests/Day = 0.3 Overall Mean FS Glucose = 102 Standard Deviation = 24  BG Ranges: Low = 66 High = 116   Hypoglycemic Events/30 Days: BG < 50 = 0 Episodes of symptomatic severe hypoglycemia = 0    DIABETIC COMPLICATIONS: Microvascular complications:    Denies: retinopathy, CKD, neuropathy  Last eye exam: Completed 2.5 yrs   Macrovascular  complications:    Denies: CAD, PVD, CVA   HISTORY:  Past Medical History:  Past Medical History:  Diagnosis Date   Anal fissure    saw Dr. Mercy Riding    Anxiety    Arthritis    Depression    Diabetes mellitus    GERD (gastroesophageal reflux disease)    History of ovarian cancer in adulthood    Hyperlipidemia    Hypertension    Ovarian cancer ([place])    Skin cancer (melanoma) ([place])    Sleep apnea    uses CPAP    Tubular adenoma of colon 01/2014   Past Surgical History:  Past Surgical History:  Procedure Laterality Date   ABDOMINAL HYSTERECTOMY  06/30/1984   BSO   BASAL CELL CARCINOMA EXCISION  10/09/2012   lower lip per Dr. Link Snuffer    COLONOSCOPY  02/17/2014   per Dr. Olevia Perches, adenomatous polyps, repeat in 5 yrs    GLBS tumor ovary 1986     [place] Right    MELANOMA EXCISION  07/01/1995   upper chest    Social History:  reports that she has never smoked. She has never used smokeless tobacco. She reports that she does not drink alcohol and does not use drugs. Family History:  Family History  Problem Relation Age of Onset   Asthma Mother    Ovarian cancer Sister    Diabetes Brother    Diabetes Brother    Diabetes Maternal Grandfather  Colon cancer Paternal Grandfather    Arthritis Other    Prostate cancer Other        grandfather per pt but she is unaware of which grandfather   Coronary artery disease Other    Pancreatic cancer Neg Hx    Stomach cancer Neg Hx    Esophageal cancer Neg Hx    Rectal cancer Neg Hx    Colon polyps Neg Hx      HOME MEDICATIONS: Allergies as of 05/10/2021       Reactions   Lisinopril Cough        Medication List        Accurate as of May 10, 2021  2:45 PM. If you have any questions, ask your nurse or doctor.          albuterol 108 (90 Base) MCG/ACT inhaler Commonly known as: VENTOLIN HFA Inhale 2 puffs into the lungs every 6 (six) hours as needed for wheezing or  shortness of breath.   ALPRAZolam 0.5 MG tablet Commonly known as: XANAX Take 1 tablet (0.5 mg total) by mouth at bedtime as needed for anxiety.   amoxicillin-clavulanate 875-125 MG tablet Commonly known as: AUGMENTIN Take 1 tablet by mouth 2 (two) times daily.   atorvastatin 80 MG tablet Commonly known as: LIPITOR Take 1 tablet (80 mg total) by mouth daily.   diclofenac 75 MG EC tablet Commonly known as: VOLTAREN Take 75 mg by mouth 2 (two) times daily.   glipiZIDE 5 MG tablet Commonly known as: GLUCOTROL TAKE 1 TABLET TWICE A DAY BEFORE MEALS What changed: See the new instructions.   ipratropium 0.06 % nasal spray Commonly known as: ATROVENT Place 2 sprays into both nostrils 3 (three) times daily.   losartan-hydrochlorothiazide 50-12.5 MG tablet Commonly known as: HYZAAR Take 1 tablet by mouth daily.   metFORMIN 1000 MG tablet Commonly known as: GLUCOPHAGE Take 1 tablet (1,000 mg total) by mouth 2 (two) times daily with a meal.   metoprolol tartrate 50 MG tablet Commonly known as: LOPRESSOR Take 1 tablet (50 mg total) by mouth 2 (two) times daily.   omeprazole 40 MG capsule Commonly known as: PRILOSEC Take 1 capsule (40 mg total) by mouth in the morning and at bedtime.   ONE TOUCH LANCETS Misc Check blood sugar 1-2 daily for OneTouch Ultra Mini system Dx code: E11.9   ONE TOUCH ULTRA MINI w/Device Kit Use to check blood sugar 1-2 times a day.   OneTouch Ultra test strip Generic drug: glucose blood TEST 1-2 TIMES A DAY   predniSONE 20 MG tablet Commonly known as: DELTASONE Take 20 mg by mouth 2 (two) times daily.   Rybelsus 7 MG Tabs Generic drug: Semaglutide TAKE 1 TABLET DAILY BEFORE BREAKFAST   tiZANidine 2 MG tablet Commonly known as: ZANAFLEX Take 2 mg by mouth 3 (three) times daily.   venlafaxine XR 150 MG 24 hr capsule Commonly known as: EFFEXOR-XR Take one tablet by mouth daily.         OBJECTIVE:   Vital Signs: BP 110/84 (BP  Location: Left Arm, Patient Position: Sitting, Cuff Size: Normal)   Pulse 82   Ht '5\' 4"'  (1.626 m)   Wt 212 lb (96.2 kg)   LMP 03/22/1986   SpO2 98%   BMI 36.39 kg/m   Wt Readings from Last 3 Encounters:  05/10/21 212 lb (96.2 kg)  02/06/21 217 lb 12.8 oz (98.8 kg)  01/25/21 221 lb 9.6 oz (100.5 kg)     Exam: General:  Pt appears well and is in NAD  Lungs: Clear with good BS bilat with no rales, rhonchi, or wheezes  Heart: RRR with normal S1 and S2 and no gallops; no murmurs; no rub  Abdomen: Normoactive bowel sounds, soft, nontender, without masses or organomegaly palpable  Extremities: No pretibial edema.  Skin: Normal texture and temperature to palpation.  Neuro: MS is good with appropriate affect, pt is alert and Ox3      DM Foot exam 09/04/2020 The skin of the feet is intact without sores or ulcerations. The pedal pulses are 2+ on right and 2+ on left. The sensation is absent on the right  to a screening 5.07, 10 gram monofilament and decreased on the left    DATA REVIEWED:  Lab Results  Component Value Date   HGBA1C 6.1 (A) 05/10/2021   HGBA1C 5.9 (A) 09/04/2020   HGBA1C 6.6 (A) 02/22/2020   Results for BRITTINI, BRUBECK (MRN [id number]) as of 05/13/2021 10:06  Ref. Range 05/10/2021 08:00  Alkaline Phosphatase Latest Ref Range: 39 - 117 U/L 58  Albumin Latest Ref Range: 3.5 - 5.2 g/dL 4.3  AST Latest Ref Range: 0 - 37 U/L 17  ALT Latest Ref Range: 0 - 35 U/L 17  Total Protein Latest Ref Range: 6.0 - 8.3 g/dL 7.0  Bilirubin, Direct Latest Ref Range: 0.0 - 0.3 mg/dL 0.1  Total Bilirubin Latest Ref Range: 0.2 - 1.2 mg/dL 0.6  Total CHOL/HDL Ratio Unknown 4  Cholesterol Latest Ref Range: 0 - 200 mg/dL 142  HDL Cholesterol Latest Ref Range: >39.00 mg/dL 33.30 (L)  Direct LDL Latest Units: mg/dL 66.0  NonHDL Unknown 108.27  Triglycerides Latest Ref Range: 0.0 - 149.0 mg/dL 266.0 (H)  VLDL Latest Ref Range: 0.0 - 40.0 mg/dL 53.2 (H)     Results for  CATERIN, TABARES (MRN [id number]) as of 05/13/2021 08:31  Ref. Range 05/10/2021 15:08  TSH Latest Ref Range: 0.35 - 5.50 uIU/mL 1.27  T4,Free(Direct) Latest Ref Range: 0.60 - 1.60 ng/dL 0.86    ASSESSMENT / PLAN / RECOMMENDATIONS:   1) Type 2 Diabetes Mellitus, Optimally  Controlled, With Neuropathic  complications - Most recent A1c of 6.1 %. Goal A1c < 7.0 %.   - Pt with hypoglycemia,  I have encouraged her to check glucose 2-3 times a week  - Will stop Glipizide as below  - I have encouraged her to continue with lifestyle changes     MEDICATIONS:  - STOP  Glipizide 5 mg - Continue Metformin 1000 mg  half a tablet twice a day  - Continue Rybelsus 7 mg daily    EDUCATION / INSTRUCTIONS: BG monitoring instructions: Patient is instructed to check her blood sugars 3 times a week, fasting. Call [facility] if: BG persistently < 70  I reviewed the Rule of 15 for the treatment of hypoglycemia in detail with the patient. Literature supplied.    2) Diabetic complications:  Eye: Does not  have known diabetic retinopathy.  Neuro/ Feet: Does not have known diabetic peripheral neuropathy. Renal: Patient does not have known baseline CKD. Normal MA/Cr ratio      3) Dyslipidemia:   - LDL at goal 66 mg/dL  - Tg  continues to be elevated but trending down   - Patient is on lipitor 80 mg daily      4) Unintentional weight loss :  - This could be attributed to chaging her eating habits, we also discussed that rybelsus causes weight loss  -  TFT's today are normal  F/U in 6 months    Signed electronically by: Mack Guise, MD  [facility]  [facility] [address] [place], [place] [address] Phone: [phone] FAX: [phone]   CC: Laurey Morale, [place] [address] Phone: [phone]  Fax: [phone]  Return to Endocrinology clinic as below: Future Appointments   Date Time Provider [place]  05/10/2021  3:00 PM Yoshie Kosel, Melanie Crazier, MD [facility] None  06/06/2021  3:15 PM Garrel Ridgel, DPM [facility]

## 2021-05-10 NOTE — Patient Instructions (Addendum)
-   STOP Glipizide  - Continue Metformin half a tablet twice a day  - Continue Rybelsus 7 mg daily    HOW TO TREAT LOW BLOOD SUGARS (Blood sugar LESS THAN 70 MG/DL) Please follow the RULE OF 15 for the treatment of hypoglycemia treatment (when your (blood sugars are less than 70 mg/dL)   STEP 1: Take 15 grams of carbohydrates when your blood sugar is low, which includes:  3-4 GLUCOSE TABS  OR 3-4 OZ OF JUICE OR REGULAR SODA OR ONE TUBE OF GLUCOSE GEL    STEP 2: RECHECK blood sugar in 15 MINUTES STEP 3: If your blood sugar is still low at the 15 minute recheck --> then, go back to STEP 1 and treat AGAIN with another 15 grams of carbohydrates.

## 2021-05-12 ENCOUNTER — Encounter: Payer: Self-pay | Admitting: Internal Medicine

## 2021-05-13 NOTE — Telephone Encounter (Signed)
That is a bit higher than it should be, but it's not terrible either. Just continue to watch the carb intake

## 2021-05-28 NOTE — Telephone Encounter (Signed)
Please advise on the note about using the nasal spray? Last seen by you in August.

## 2021-05-28 NOTE — Telephone Encounter (Signed)
Yes ok to use prescribed Atrovent nasal spray as needed twice daily for PND symptoms

## 2021-05-29 ENCOUNTER — Encounter: Payer: Self-pay | Admitting: Internal Medicine

## 2021-06-06 ENCOUNTER — Ambulatory Visit: Payer: Commercial Managed Care - PPO | Admitting: Podiatry

## 2021-06-27 ENCOUNTER — Ambulatory Visit (INDEPENDENT_AMBULATORY_CARE_PROVIDER_SITE_OTHER): Payer: Commercial Managed Care - PPO | Admitting: Family Medicine

## 2021-06-27 ENCOUNTER — Encounter: Payer: Self-pay | Admitting: Podiatry

## 2021-06-27 ENCOUNTER — Encounter: Payer: Self-pay | Admitting: Internal Medicine

## 2021-06-27 ENCOUNTER — Encounter: Payer: Self-pay | Admitting: Family Medicine

## 2021-06-27 VITALS — BP 118/80 | HR 66 | Temp 97.6°F | Wt 210.0 lb

## 2021-06-27 DIAGNOSIS — G5603 Carpal tunnel syndrome, bilateral upper limbs: Secondary | ICD-10-CM

## 2021-06-27 DIAGNOSIS — R200 Anesthesia of skin: Secondary | ICD-10-CM

## 2021-06-27 DIAGNOSIS — R202 Paresthesia of skin: Secondary | ICD-10-CM

## 2021-06-27 NOTE — Progress Notes (Signed)
° °  Subjective:    Patient ID: Morgan [name], female    DOB: [birth year], 55 y.o.   MRN: [id number]  HPI Here to discuss numbness and tingling in both hands that started about 2 years ago. This has steadily gotten worse over time. No weakness. Sometimes her hands will ache a bit. The right hand is more affected than the left. 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    Review of Systems  Constitutional: Negative.   Respiratory: Negative.    Cardiovascular: Negative.   Neurological:  Positive for numbness. Negative for weakness.      Objective:   Physical Exam Constitutional:      General: She is not in acute distress.    Appearance: Normal appearance.  Cardiovascular:     Rate and Rhythm: Normal rate and regular rhythm.     Pulses: Normal pulses.     Heart sounds: Normal heart sounds.  Pulmonary:     Effort: Pulmonary effort is normal.     Breath sounds: Normal breath sounds.  Musculoskeletal:     Comments: Both hands appear normal with pink color, they are warm, fingers have full ROM   Neurological:     Mental Status: She is alert.          Assessment & Plan:  Numbness of both hands, likely due to carpal tunnel syndrome. We will set up nerve conduction studies of both arms soon. She already wears braces on both wrists at night, and I advised her to continue this. Morgan Penna, MD

## 2021-06-28 ENCOUNTER — Encounter: Payer: Self-pay | Admitting: Neurology

## 2021-07-07 ENCOUNTER — Encounter: Payer: Self-pay | Admitting: Neurology

## 2021-07-08 ENCOUNTER — Encounter: Payer: Self-pay | Admitting: Neurology

## 2021-07-16 ENCOUNTER — Other Ambulatory Visit: Payer: Self-pay | Admitting: Family Medicine

## 2021-07-16 ENCOUNTER — Other Ambulatory Visit: Payer: Self-pay

## 2021-07-16 ENCOUNTER — Encounter: Payer: Self-pay | Admitting: Family Medicine

## 2021-07-16 DIAGNOSIS — E785 Hyperlipidemia, unspecified: Secondary | ICD-10-CM

## 2021-07-16 MED ORDER — ATORVASTATIN CALCIUM 80 MG PO TABS: 80.0000 mg | ORAL_TABLET | Freq: Every day | ORAL | 1 refills | Status: DC

## 2021-07-16 MED ORDER — ALPRAZOLAM 1 MG PO TABS: 1.0000 mg | ORAL_TABLET | Freq: Every evening | ORAL | 1 refills | Status: DC | PRN

## 2021-07-16 NOTE — Telephone Encounter (Signed)
I sent in for 1 mg Xanax to take at bedtime

## 2021-07-16 NOTE — Telephone Encounter (Signed)
It sounds like she is in a crisis situation. Please advise her to contact the [facility] at [address] Their phone is [phone]. They have medical staff working 24 hours a day to help with this sort of thing

## 2021-07-17 NOTE — Telephone Encounter (Signed)
Spoke with pt advised of Dr Sarajane Jews recommendations provided with the contact number to [facility], pt verbalized understanding that she needs to call them ASAP per Dr Sarajane Jews

## 2021-07-19 ENCOUNTER — Ambulatory Visit (HOSPITAL_COMMUNITY)
Admission: EM | Admit: 2021-07-19 | Discharge: 2021-07-19 | Discharge status: Against Medical Advice | Payer: BC Managed Care – PPO

## 2021-07-19 NOTE — BH Assessment (Signed)
Patient is a 56 year old female that presents voluntary this date after being referred by her PCP Sarajane Jews MD. Patient had contacted him earlier stating she was "having a nervous breakdown" due to her recently having to admit her 67 year old mother to a memory care unit. 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 Patient states she is prescribed medications by Sarajane Jews MD to assist with anxiety and depression that she was diagnosed with over 5 years ago. Patient states she is currently taking Effexor and Xanax (See MAR) for dosages. Patient denies any S/I, H/I or AVH. Patient denies any SA issues or history of self harm. Patient is requesting assistance with counseling resources this date to assist with ongoing needs. Patient declined to sign waiver of medical screening form on admission stating "she isn't signing anything" because she doesn't have "good insurance." This Probation officer explained that the form was not associated with billing and what the from was in reference to although patient continued to decline to sign.

## 2021-07-19 NOTE — Progress Notes (Signed)
Patient is a 56 year old female that presents voluntary this date after being referred by her PCP Sarajane Jews MD. Patient had contacted him earlier stating she was "having a nervous breakdown" due to her recently having to admit her 65 year old mother to a memory care unit. 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 Patient states she is prescribed medications by Sarajane Jews MD to assist with anxiety and depression that she was diagnosed with over 5 years ago. Patient states she is currently taking Effexor and Xanax (See MAR) for dosages. Patient denies any S/I, H/I or AVH. Patient denies any SA issues or history of self harm. Patient is requesting assistance with counseling resources this date to assist with ongoing needs.

## 2021-07-19 NOTE — BH Assessment (Deleted)
Patient is a 56 year old female that presents voluntary this date after being referred by her PCP Sarajane Jews MD. Patient had contacted him earlier stating she was "having a nervous breakdown" due to her recently having to admit her 59 year old mother to a memory care unit. 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 Patient states she is prescribed medications by Sarajane Jews MD to assist with anxiety and depression that she was diagnosed with over 5 years ago. Patient states she is currently taking Effexor and Xanax (See MAR) for dosages. Patient denies any S/I, H/I or AVH. Patient denies any SA issues or history of self harm. Patient is requesting assistance with counseling resources this date to assist with ongoing needs. Patient declined to sign waiver of medical screening form on admission stating "she isn't signing anything" because she doesn't have "good insurance." This Probation officer explained that the form was not associated with billing and what the from was in reference to although patient continued to decline to sign.

## 2021-07-19 NOTE — ED Provider Notes (Signed)
This writer walked about patient in hallway.  Patient states "it is getting late, I need to leave." Morgan [name] denies suicidal and homicidal ideations at this time.  She reports she "needs to get home." Patient voluntary at this time.  No suicidal ideation, homicidal ideation or psychosis noted during triage. Apologized for wait and offered to complete assessment. Patient left AMA.

## 2021-07-20 ENCOUNTER — Encounter: Payer: Self-pay | Admitting: Neurology

## 2021-07-20 ENCOUNTER — Encounter: Payer: Self-pay | Admitting: Podiatry

## 2021-07-22 NOTE — Telephone Encounter (Signed)
Returned the call to patient giving results, verbalized understanding and said that she was unable to come in at time of appointment, will contact us when ready to schedule.

## 2021-07-22 NOTE — Telephone Encounter (Signed)
Spoke with pt advised to schedule office visit pt ok with tomorrow at 3.45 pm, ok to change from virtual to OV, please advise

## 2021-07-22 NOTE — Telephone Encounter (Signed)
Pt is schedule for tomorrow at 3.45 pm per Dr Sarajane Jews

## 2021-07-23 ENCOUNTER — Ambulatory Visit (INDEPENDENT_AMBULATORY_CARE_PROVIDER_SITE_OTHER): Payer: BC Managed Care – PPO | Admitting: Family Medicine

## 2021-07-23 ENCOUNTER — Encounter: Payer: Self-pay | Admitting: Family Medicine

## 2021-07-23 VITALS — BP 110/78 | HR 78 | Temp 98.5°F | Wt 208.0 lb

## 2021-07-23 DIAGNOSIS — F32A Depression, unspecified: Secondary | ICD-10-CM | POA: Diagnosis not present

## 2021-07-23 DIAGNOSIS — F419 Anxiety disorder, unspecified: Secondary | ICD-10-CM | POA: Diagnosis not present

## 2021-07-23 NOTE — Progress Notes (Signed)
° °  Subjective:    Patient ID: Morgan [name], female    DOB: 1966/06/19, 56 y.o.   MRN: [id number]  HPI Here to discuss her anxiety and depression.  She has been under a great deal of stress the past few months, and there are several reasons why. 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 This means she is on her feet for 8 hours at a time, and this causes her back and her feet to hurt. Second her mother's dementia continues to get worse, and she was recently moved to a memory unit at a [employer] [facility] facility. Morgan [name] was forced to do all the investigative work to find a place and to get her mother admitted, even though sister is their mother's POA. Morgan [name] is angry and resentful about having to do all the leg work of getting her mother moved, while her sister and brother did nothing to help. All this stress has caused her depression and anxiety to flare up. She contacted [place] on 07-19-21 about this and told the triage nurse that she was having suicidal thoughts. Accordingly I recommended she go to the 24 hour acute clinic at the [facility] for help. She did so, but she found this to be stressful and not helpful. She says she waited over 2 hours to speak to someone, and by the time they were about to work with her she left AMA (this is according to the note by Beatriz Stallion FNP). Since then Morgan [name] has calmed down somewhat and she asks my advice. She says she did have some fleeting thoughts about harming herself that day, but she never had a plan and never had any intention of doing so. These thoughts have now gone away.    Review of Systems  Constitutional: Negative.   Respiratory: Negative.    Cardiovascular: Negative.   Psychiatric/Behavioral:  Positive for agitation, decreased concentration, dysphoric mood and sleep disturbance. Negative for confusion, hallucinations, self-injury and suicidal  ideas. The patient is nervous/anxious.       Objective:   Physical Exam Constitutional:      Appearance: Normal appearance.  Cardiovascular:     Rate and Rhythm: Normal rate and regular rhythm.     Pulses: Normal pulses.     Heart sounds: Normal heart sounds.  Pulmonary:     Effort: Pulmonary effort is normal.     Breath sounds: Normal breath sounds.  Neurological:     General: No focal deficit present.     Mental Status: She is alert and oriented to person, place, and time.  Psychiatric:        Behavior: Behavior normal.        Thought Content: Thought content normal.        Judgment: Judgment normal.     Comments: Somewhat anxious           Assessment & Plan:  She has been dealing with a good deal of stress which is exacerbating her depression and anxiety. She will continue taking her Venlafaxine XR daily and prn Xanax. I encouraged her to begin seeing a therapist, and she agreed. I gave her information about [facility] so she can contact them.  [name], MD

## 2021-07-24 ENCOUNTER — Encounter: Payer: Self-pay | Admitting: Family Medicine

## 2021-07-31 ENCOUNTER — Other Ambulatory Visit: Payer: Self-pay

## 2021-07-31 DIAGNOSIS — R202 Paresthesia of skin: Secondary | ICD-10-CM

## 2021-08-01 ENCOUNTER — Encounter: Payer: Self-pay | Admitting: Internal Medicine

## 2021-08-01 ENCOUNTER — Encounter: Payer: Self-pay | Admitting: Orthopaedic Surgery

## 2021-08-01 ENCOUNTER — Other Ambulatory Visit: Payer: Self-pay

## 2021-08-01 ENCOUNTER — Ambulatory Visit (INDEPENDENT_AMBULATORY_CARE_PROVIDER_SITE_OTHER): Payer: BC Managed Care – PPO | Admitting: Neurology

## 2021-08-01 ENCOUNTER — Encounter: Payer: Self-pay | Admitting: Family Medicine

## 2021-08-01 ENCOUNTER — Telehealth: Payer: Self-pay | Admitting: Orthopaedic Surgery

## 2021-08-01 DIAGNOSIS — R202 Paresthesia of skin: Secondary | ICD-10-CM

## 2021-08-01 DIAGNOSIS — G5603 Carpal tunnel syndrome, bilateral upper limbs: Secondary | ICD-10-CM

## 2021-08-01 NOTE — Telephone Encounter (Signed)
Called patient left message on voicemail to return call per her mychart message needing to change her appointment until after 3:30pm.

## 2021-08-01 NOTE — Procedures (Signed)
[facility]  [address]  [place], [place] [address] Tel: [phone] Fax:  [phone] Test Date:  08/01/2021  Patient: Morgan [name] DOB: 1966-05-10 Physician: Narda Amber, DO  Sex: Female Height: 5\' 4"  Ref Phys: [name], M.D.  ID#: [id number]   Technician:    Patient Complaints: This is a 56 year old female referred for evaluation of bilateral hand paresthesias.  NCV & EMG Findings: Extensive electrode testing of the right upper extremity and additional studies of the left shows:  Bilateral median sensory responses are absent.  Bilateral ulnar sensory responses are within normal limits. Bilateral median motor responses show prolonged latency (R7.0, L6.6 ms).  Bilateral ulnar motor responses are within normal limits. There is no evidence of active or chronic motor axonal loss changes affecting any of the tested muscles.  Motor unit configuration and recruitment pattern is within normal limits.  Impression: Bilateral median neuropathy at or distal to the wrist, consistent with a clinical diagnosis of carpal tunnel syndrome.  Overall, these findings are severe in degree electrically.   ___________________________ Narda Amber, DO    Nerve Conduction Studies Anti Sensory Summary Table   Stim Site NR Peak (ms) Norm Peak (ms) P-T Amp (V) Norm P-T Amp  Left Median Anti Sensory (2nd Digit)  34C  Wrist NR  <3.6  >15  Right Median Anti Sensory (2nd Digit)  34C  Wrist NR  <3.6  >15  Left Ulnar Anti Sensory (5th Digit)  34C  Wrist    2.7 <3.1 31.0 >10  Right Ulnar Anti Sensory (5th Digit)  34C  Wrist    2.4 <3.1 30.6 >10   Motor Summary Table   Stim Site NR Onset (ms) Norm Onset (ms) O-P Amp (mV) Norm O-P Amp Site1 Site2 Delta-0 (ms) Dist (cm) Vel (m/s) Norm Vel (m/s)  Left Median Motor (Abd Poll Brev)  34C  Wrist    6.6 <4.0 7.6 >6 Elbow Wrist 5.3 28.0 53 >50  Elbow    11.9  7.3         Right Median Motor (Abd Poll Brev)  34C  Wrist     7.0 <4.0 6.5 >6 Elbow Wrist 5.4 28.0 52 >50  Elbow    12.4  5.8         Left Ulnar Motor (Abd Dig Minimi)  34C  Wrist    2.2 <3.1 7.5 >7 B Elbow Wrist 3.7 21.0 57 >50  B Elbow    5.9  7.2  A Elbow B Elbow 1.7 10.0 59 >50  A Elbow    7.6  7.1         Right Ulnar Motor (Abd Dig Minimi)  34C  Wrist    1.6 <3.1 8.5 >7 B Elbow Wrist 4.0 21.0 53 >50  B Elbow    5.6  7.4  A Elbow B Elbow 1.9 10.0 53 >50  A Elbow    7.5  7.0          EMG   Side Muscle Ins Act Fibs Psw Fasc Number Recrt Dur Dur. Amp Amp. Poly Poly. Comment  Right 1stDorInt Nml Nml Nml Nml Nml Nml Nml Nml Nml Nml Nml Nml N/A  Right Abd Poll Brev Nml Nml Nml Nml Nml Nml Nml Nml Nml Nml Nml Nml N/A  Right PronatorTeres Nml Nml Nml Nml Nml Nml Nml Nml Nml Nml Nml Nml N/A  Right Biceps Nml Nml Nml Nml Nml Nml Nml Nml Nml Nml Nml Nml N/A  Right Triceps  Nml Nml Nml Nml Nml Nml Nml Nml Nml Nml Nml Nml N/A  Right Deltoid Nml Nml Nml Nml Nml Nml Nml Nml Nml Nml Nml Nml N/A  Left 1stDorInt Nml Nml Nml Nml Nml Nml Nml Nml Nml Nml Nml Nml N/A  Left PronatorTeres Nml Nml Nml Nml Nml Nml Nml Nml Nml Nml Nml Nml N/A  Left Abd Poll Brev Nml Nml Nml Nml Nml Nml Nml Nml Nml Nml Nml Nml N/A  Left Biceps Nml Nml Nml Nml Nml Nml Nml Nml Nml Nml Nml Nml N/A  Left Triceps Nml Nml Nml Nml Nml Nml Nml Nml Nml Nml Nml Nml N/A  Left Deltoid Nml Nml Nml Nml Nml Nml Nml Nml Nml Nml Nml Nml N/A      Waveforms:

## 2021-08-02 DIAGNOSIS — G5603 Carpal tunnel syndrome, bilateral upper limbs: Secondary | ICD-10-CM | POA: Insufficient documentation

## 2021-08-02 NOTE — Telephone Encounter (Signed)
See my Result Note  

## 2021-08-02 NOTE — Addendum Note (Signed)
Addended by: [name] A on: 08/02/2021 07:47 AM   Modules accepted: Orders

## 2021-08-02 NOTE — Telephone Encounter (Signed)
Reviewed EMG report with pt verbalized understanding

## 2021-08-05 ENCOUNTER — Encounter: Payer: Self-pay | Admitting: Family Medicine

## 2021-08-05 ENCOUNTER — Telehealth (HOSPITAL_COMMUNITY): Payer: Self-pay | Admitting: Family Medicine

## 2021-08-05 NOTE — BH Assessment (Signed)
Care Management - Follow Up [facility] Discharges   Writer made contact with the patient. Patient reports that she is in the process of scheduling an appointment to see a psychiatrist and a therapist.

## 2021-08-06 ENCOUNTER — Other Ambulatory Visit: Payer: Self-pay

## 2021-08-06 ENCOUNTER — Encounter: Payer: Self-pay | Admitting: Family Medicine

## 2021-08-06 DIAGNOSIS — F32A Depression, unspecified: Secondary | ICD-10-CM

## 2021-08-06 MED ORDER — VENLAFAXINE HCL ER 150 MG PO CP24: ORAL_CAPSULE | ORAL | 3 refills | Status: DC

## 2021-08-08 DIAGNOSIS — F331 Major depressive disorder, recurrent, moderate: Secondary | ICD-10-CM | POA: Diagnosis not present

## 2021-08-15 DIAGNOSIS — F331 Major depressive disorder, recurrent, moderate: Secondary | ICD-10-CM | POA: Diagnosis not present

## 2021-08-20 DIAGNOSIS — G4733 Obstructive sleep apnea (adult) (pediatric): Secondary | ICD-10-CM | POA: Diagnosis not present

## 2021-08-24 ENCOUNTER — Encounter: Payer: Self-pay | Admitting: Family Medicine

## 2021-08-26 ENCOUNTER — Ambulatory Visit (INDEPENDENT_AMBULATORY_CARE_PROVIDER_SITE_OTHER): Payer: BC Managed Care – PPO | Admitting: Orthopaedic Surgery

## 2021-08-26 ENCOUNTER — Other Ambulatory Visit: Payer: Self-pay

## 2021-08-26 ENCOUNTER — Ambulatory Visit: Payer: Self-pay

## 2021-08-26 ENCOUNTER — Ambulatory Visit: Payer: BC Managed Care – PPO | Admitting: Orthopaedic Surgery

## 2021-08-26 DIAGNOSIS — G5601 Carpal tunnel syndrome, right upper limb: Secondary | ICD-10-CM | POA: Diagnosis not present

## 2021-08-26 DIAGNOSIS — I10 Essential (primary) hypertension: Secondary | ICD-10-CM

## 2021-08-26 DIAGNOSIS — G5602 Carpal tunnel syndrome, left upper limb: Secondary | ICD-10-CM | POA: Diagnosis not present

## 2021-08-26 DIAGNOSIS — M7061 Trochanteric bursitis, right hip: Secondary | ICD-10-CM

## 2021-08-26 DIAGNOSIS — G5603 Carpal tunnel syndrome, bilateral upper limbs: Secondary | ICD-10-CM

## 2021-08-26 DIAGNOSIS — M7062 Trochanteric bursitis, left hip: Secondary | ICD-10-CM

## 2021-08-26 DIAGNOSIS — M25552 Pain in left hip: Secondary | ICD-10-CM | POA: Diagnosis not present

## 2021-08-26 MED ORDER — LIDOCAINE HCL 1 % IJ SOLN
3.0000 mL | INTRAMUSCULAR | Status: AC | PRN
  Administered 2021-08-26: 3 mL

## 2021-08-26 MED ORDER — METOPROLOL TARTRATE 50 MG PO TABS: 50.0000 mg | ORAL_TABLET | Freq: Two times a day (BID) | ORAL | 0 refills | Status: DC

## 2021-08-26 MED ORDER — METHYLPREDNISOLONE ACETATE 40 MG/ML IJ SUSP
40.0000 mg | INTRAMUSCULAR | Status: AC | PRN
  Administered 2021-08-26: 40 mg via INTRA_ARTICULAR

## 2021-08-26 NOTE — Progress Notes (Signed)
Office Visit Note   Patient: Morgan [name]           Date of Birth: 03/21/1966           MRN: [id number] Visit Date: 08/26/2021              Requested by: Laurey Morale, MD [place],  [place] [address] PCP: Laurey Morale, MD   Assessment & Plan: Visit Diagnoses:  1. Pain in left hip   2. Trochanteric bursitis, left hip   3. Carpal tunnel syndrome, left upper limb   4. Carpal tunnel syndrome, right upper limb     Plan: For nerve conduction studies of the bilateral upper extremities are consistent with severe carpal tunnel syndrome.  I described what this means and talked about the anatomy of the wrist.  I have recommended an open carpal tunnel release.  I described what the surgery involves and discussed the risk and benefits of surgery.  She said she would think about this and give [place] a call when to schedule her for a right open carpal tunnel release.  I did recommend a steroid injection over the left trochanteric area and she agreed to this and tolerated it well.  If this continues to bother her I would recommend outpatient physical therapy for her left hip.  She will let us know.  Follow-Up Instructions: Return if symptoms worsen or fail to improve.   Orders:  Orders Placed This Encounter  Procedures   Large Joint Inj   XR HIP UNILAT W OR W/O PELVIS 1V LEFT   No orders of the defined types were placed in this encounter.     Procedures: Large Joint Inj: L greater trochanter on 08/26/2021 4:19 PM Indications: pain and diagnostic evaluation Details: 22 G 1.5 in needle, lateral approach  Arthrogram: No  Medications: 3 mL lidocaine 1 %; 40 mg methylPREDNISolone acetate 40 MG/ML Outcome: tolerated well, no immediate complications Procedure, treatment alternatives, risks and benefits explained, specific risks discussed. Consent was given by the patient. Immediately prior to procedure a time out was called to verify the correct patient, procedure,  equipment, support staff and site/side marked as required. Patient was prepped and draped in the usual sterile fashion.      Clinical Data: No additional findings.   Subjective: Chief Complaint  Patient presents with   Left Hip - Pain   Left Hand - Pain   Right Hand - Pain  The patient comes in today with left hip pain but also bilateral hand pain and numbness and tingling.  She actually has EMG/NVC studies on the canopy system.  This shows severe bilateral carpal tunnel syndrome.  She does wear splints at night.  This is been going on for a long period of time.  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  She does have remote history of a right hip replacement that we replaced a long time ago.  She denies any left hip groin pain but does report pain over the left side of her hip and that pain does wake her up at night when she rolls on that side of her hip.  She does report numbness and tingling in both of her hands.  She is right-hand dominant and is worse on the right side.  She does report weaker pinch and grip strength.  She is a diabetic but reports good blood glucose control.  HPI  Review of Systems There is no listed  fever, chills, nausea, vomiting  Objective: Vital Signs: LMP 03/22/1986   Physical Exam She is alert and orient x3 and in no acute distress Ortho Exam Examination of her left hip shows it moves smoothly and fluidly with no pain in the groin and no blocks to rotation at all.  Her pain is a palpation over the trochanteric area and the proximal IT band.  Her right operative approximately smoothly.  Both hands were assessed.  She has no muscle atrophy but definitely numbness in the median nerve distribution bilaterally.  She has weak pinch and grip strength and a positive Phalen's and Tinel's exam. Specialty Comments:  No specialty comments available.  Imaging: XR HIP UNILAT W OR W/O PELVIS 1V LEFT  Result Date: 08/26/2021 An AP pelvis and  lateral of the left hip shows a well-seated total hip arthroplasty on the right side.  The left hip joint space is well-maintained with no significant arthritic findings.    PMFS History: Patient Active Problem List   Diagnosis Date Noted   Carpal tunnel syndrome, left upper limb 08/26/2021   Carpal tunnel syndrome, right upper limb 08/26/2021   Carpal tunnel syndrome on both sides 08/02/2021   Asthma 01/09/2021   Vitamin B12 deficiency 09/05/2020   Dyslipidemia 09/04/2020   Neuropathy 09/04/2020   Mixed dyslipidemia 09/04/2020   COVID-19 virus infection 06/19/2020   Type 2 diabetes mellitus with diabetic polyneuropathy, without long-term current use of insulin ([place]) 08/25/2019   Type 2 diabetes mellitus with hyperglycemia, without long-term current use of insulin ([place]) 02/17/2019   Anxiety and depression 02/17/2019   BMI 45.0-49.9, adult ([place]) 02/04/2019   Ringing in ears, bilateral 12/29/2017   OSA on CPAP 01/06/2017   Witnessed episode of apnea 07/03/2016   Diverticulitis of colon 11/30/2014   IBS (irritable bowel syndrome) 04/21/2013   Chronic cough 11/26/2011   External hemorrhoids 04/04/2010   UNSPECIFIED THROMBOSED HEMORRHOIDS 01/08/2009   GERD 08/23/2008   Diabetes mellitus without complication ([place]) 64/40/3474   Hyperlipidemia 01/22/2007   Essential hypertension 01/22/2007   Past Medical History:  Diagnosis Date   Anal fissure    saw Dr. Mercy Riding    Anxiety    Arthritis    Depression    Diabetes mellitus    GERD (gastroesophageal reflux disease)    History of ovarian cancer in adulthood    Hyperlipidemia    Hypertension    Ovarian cancer ([place])    Skin cancer (melanoma) ([place])    Sleep apnea    uses CPAP    Tubular adenoma of colon 01/2014    Family History  Problem Relation Age of Onset   Asthma Mother    Ovarian cancer Sister    Diabetes Brother    Diabetes Brother    Diabetes Maternal Grandfather    Colon cancer Paternal Grandfather     Arthritis Other    Prostate cancer Other        grandfather per pt but she is unaware of which grandfather   Coronary artery disease Other    Pancreatic cancer Neg Hx    Stomach cancer Neg Hx    Esophageal cancer Neg Hx    Rectal cancer Neg Hx    Colon polyps Neg Hx     Past Surgical History:  Procedure Laterality Date   ABDOMINAL HYSTERECTOMY  06/30/1984   BSO   BASAL CELL CARCINOMA EXCISION  10/09/2012   lower lip per Dr. Link Snuffer    COLONOSCOPY  02/17/2014   per Dr. Olevia Perches, adenomatous  polyps, repeat in 5 yrs    GLBS tumor ovary [place] Right    MELANOMA EXCISION  07/01/1995   upper chest    Social History   Occupational History   Occupation: billing     Employer: [employer]  Tobacco Use   Smoking status: Never   Smokeless tobacco: Never  Vaping Use   Vaping Use: Never used  Substance and Sexual Activity   Alcohol use: No    Alcohol/week: 0.0 standard drinks   Drug use: No   Sexual activity: Not on file

## 2021-08-27 ENCOUNTER — Ambulatory Visit: Payer: BC Managed Care – PPO | Admitting: Orthopaedic Surgery

## 2021-08-27 DIAGNOSIS — F331 Major depressive disorder, recurrent, moderate: Secondary | ICD-10-CM | POA: Diagnosis not present

## 2021-09-05 DIAGNOSIS — F331 Major depressive disorder, recurrent, moderate: Secondary | ICD-10-CM | POA: Diagnosis not present

## 2021-09-06 ENCOUNTER — Ambulatory Visit: Payer: Commercial Managed Care - PPO | Admitting: Neurology

## 2021-09-07 DIAGNOSIS — S0501XA Injury of conjunctiva and corneal abrasion without foreign body, right eye, initial encounter: Secondary | ICD-10-CM | POA: Diagnosis not present

## 2021-09-26 ENCOUNTER — Telehealth: Payer: Self-pay | Admitting: Family Medicine

## 2021-09-26 MED ORDER — LOSARTAN POTASSIUM-HCTZ 50-12.5 MG PO TABS: 1.0000 | ORAL_TABLET | Freq: Every day | ORAL | 1 refills | Status: DC

## 2021-09-26 NOTE — Telephone Encounter (Signed)
Patient called to get refill on losartan-hydrochlorothiazide (HYZAAR) 50-12.5 MG tablet ? ? ? ?Please send to ? ? ?[place], [place] Phone:  [phone]  ?Fax:  [phone]  ?  ? ? ? ?Please advise  ?

## 2021-09-26 NOTE — Telephone Encounter (Signed)
No CMP noted over last year. Pt scheduled for CPE. ?

## 2021-09-30 ENCOUNTER — Encounter: Payer: Self-pay | Admitting: Family Medicine

## 2021-09-30 ENCOUNTER — Ambulatory Visit (INDEPENDENT_AMBULATORY_CARE_PROVIDER_SITE_OTHER): Payer: BC Managed Care – PPO | Admitting: Family Medicine

## 2021-09-30 VITALS — BP 102/64 | HR 78 | Temp 98.0°F | Ht 64.0 in | Wt 206.0 lb

## 2021-09-30 DIAGNOSIS — R29898 Other symptoms and signs involving the musculoskeletal system: Secondary | ICD-10-CM | POA: Diagnosis not present

## 2021-09-30 DIAGNOSIS — Z0000 Encounter for general adult medical examination without abnormal findings: Secondary | ICD-10-CM

## 2021-09-30 DIAGNOSIS — E559 Vitamin D deficiency, unspecified: Secondary | ICD-10-CM | POA: Diagnosis not present

## 2021-09-30 DIAGNOSIS — E538 Deficiency of other specified B group vitamins: Secondary | ICD-10-CM | POA: Diagnosis not present

## 2021-09-30 NOTE — Progress Notes (Signed)
? ?Subjective:  ? ? Patient ID: Morgan [name], female    DOB: 1966/02/22, 56 y.o.   MRN: [id number] ? ?HPI ?Here for a well exam. Her main concern today is weakness in the legs, the left more so than the right. No numbness or pain. She has trouble going up steps because it is difficult to pull her legs up. She has fallen a few times in the past few months, and says that either her legs give way or she rolls an ankle. She attributes these to leg weakness. She has not noticed weakness in the arms or hands. She did describe numbness and tingling in both hands a few months ago, and a NCV showed bilateral median neuropathies. Her father had MS, and she is concerned that she may have it as well. She gets regular mammograms. She sees Dr. Kelton Pillar for her type 2 diabetes  ? ? ?Review of Systems  ?Constitutional: Negative.   ?HENT: Negative.    ?Eyes: Negative.   ?Respiratory: Negative.    ?Cardiovascular: Negative.   ?Gastrointestinal: Negative.   ?Genitourinary:  Negative for decreased urine volume, difficulty urinating, dyspareunia, dysuria, enuresis, flank pain, frequency, hematuria, pelvic pain and urgency.  ?Musculoskeletal: Negative.   ?Skin: Negative.   ?Neurological:  Positive for weakness. Negative for headaches.  ?Psychiatric/Behavioral: Negative.    ? ?   ?Objective:  ? Physical Exam ?Constitutional:   ?   General: She is not in acute distress. ?   Appearance: She is well-developed. She is obese.  ?HENT:  ?   Head: Normocephalic and atraumatic.  ?   Right Ear: External ear normal.  ?   Left Ear: External ear normal.  ?   Nose: Nose normal.  ?   Mouth/Throat:  ?   Pharynx: No oropharyngeal exudate.  ?Eyes:  ?   General: No scleral icterus. ?   Conjunctiva/sclera: Conjunctivae normal.  ?   Pupils: Pupils are equal, round, and reactive to light.  ?Neck:  ?   Thyroid: No thyromegaly.  ?   Vascular: No JVD.  ?Cardiovascular:  ?   Rate and Rhythm: Normal rate and regular rhythm.  ?   Heart sounds: Normal  heart sounds. No murmur heard. ?  No friction rub. No gallop.  ?Pulmonary:  ?   Effort: Pulmonary effort is normal. No respiratory distress.  ?   Breath sounds: Normal breath sounds. No wheezing or rales.  ?Chest:  ?   Chest wall: No tenderness.  ?Abdominal:  ?   General: Bowel sounds are normal. There is no distension.  ?   Palpations: Abdomen is soft. There is no mass.  ?   Tenderness: There is no abdominal tenderness. There is no guarding or rebound.  ?Musculoskeletal:     ?   General: No tenderness. Normal range of motion.  ?   Cervical back: Normal range of motion and neck supple.  ?Lymphadenopathy:  ?   Cervical: No cervical adenopathy.  ?Skin: ?   General: Skin is warm and dry.  ?   Findings: No erythema or rash.  ?Neurological:  ?   Mental Status: She is alert and oriented to person, place, and time.  ?   Cranial Nerves: No cranial nerve deficit.  ?   Motor: No abnormal muscle tone.  ?   Coordination: Coordination normal.  ?   Deep Tendon Reflexes: Reflexes are normal and symmetric. Reflexes normal.  ?   Comments: Leg strength in the right leg is 4/4 but about 3/4  in the left leg. Arms are both 4/4   ?Psychiatric:     ?   Behavior: Behavior normal.     ?   Thought Content: Thought content normal.     ?   Judgment: Judgment normal.  ? ? ? ? ? ?   ?Assessment & Plan:  ?Well exam. We discussed diet and exercise. Get fasting labs. We will refer her to Neurology for the leg weakness.  ?[name], MD ? ? ?

## 2021-10-01 ENCOUNTER — Encounter: Payer: Self-pay | Admitting: Family Medicine

## 2021-10-01 LAB — LIPID PANEL
Cholesterol: 164 mg/dL (ref 0–200)
HDL: 42.6 mg/dL (ref >39.00)
NonHDL: 121.35
Total CHOL/HDL Ratio: 4
Triglycerides: 304 mg/dL — ABNORMAL HIGH (ref 0.0–149.0)
VLDL: 60.8 mg/dL — ABNORMAL HIGH (ref 0.0–40.0)

## 2021-10-01 LAB — BASIC METABOLIC PANEL
BUN: 20 mg/dL (ref 6–23)
CO2: 32 mEq/L (ref 19–32)
Calcium: 9.9 mg/dL (ref 8.4–10.5)
Chloride: 99 mEq/L (ref 96–112)
Creatinine, Ser: 0.88 mg/dL (ref 0.40–1.20)
GFR: 72.67 mL/min (ref >60.00)
Glucose, Bld: 141 mg/dL — ABNORMAL HIGH (ref 70–99)
Potassium: 3.5 mEq/L (ref 3.5–5.1)
Sodium: 141 mEq/L (ref 135–145)

## 2021-10-01 LAB — CBC WITH DIFFERENTIAL/PLATELET
Basophils Absolute: 0.1 10*3/uL (ref 0.0–0.1)
Basophils Relative: 0.7 % (ref 0.0–3.0)
Eosinophils Absolute: 0 10*3/uL (ref 0.0–0.7)
Eosinophils Relative: 0.1 % (ref 0.0–5.0)
HCT: 39.5 % (ref 36.0–46.0)
Hemoglobin: 13.4 g/dL (ref 12.0–15.0)
Lymphocytes Relative: 38.7 % (ref 12.0–46.0)
Lymphs Abs: 3.5 10*3/uL (ref 0.7–4.0)
MCHC: 33.9 g/dL (ref 30.0–36.0)
MCV: 87.5 fl (ref 78.0–100.0)
Monocytes Absolute: 0.6 10*3/uL (ref 0.1–1.0)
Monocytes Relative: 7.1 % (ref 3.0–12.0)
Neutro Abs: 4.8 10*3/uL (ref 1.4–7.7)
Neutrophils Relative %: 53.4 % (ref 43.0–77.0)
Platelets: 368 10*3/uL (ref 150.0–400.0)
RBC: 4.51 Mil/uL (ref 3.87–5.11)
RDW: 14.2 % (ref 11.5–15.5)
WBC: 9.1 10*3/uL (ref 4.0–10.5)

## 2021-10-01 LAB — HEPATIC FUNCTION PANEL
ALT: 16 U/L (ref 0–35)
AST: 17 U/L (ref 0–37)
Albumin: 4.5 g/dL (ref 3.5–5.2)
Alkaline Phosphatase: 54 U/L (ref 39–117)
Bilirubin, Direct: 0.1 mg/dL (ref 0.0–0.3)
Total Bilirubin: 0.5 mg/dL (ref 0.2–1.2)
Total Protein: 7.3 g/dL (ref 6.0–8.3)

## 2021-10-01 LAB — VITAMIN B12: Vitamin B-12: 148 pg/mL — ABNORMAL LOW (ref 211–911)

## 2021-10-01 LAB — LDL CHOLESTEROL, DIRECT: Direct LDL: 77 mg/dL

## 2021-10-01 LAB — VITAMIN D 25 HYDROXY (VIT D DEFICIENCY, FRACTURES): VITD: 14.66 ng/mL — ABNORMAL LOW (ref 30.00–100.00)

## 2021-10-01 LAB — HEMOGLOBIN A1C: Hgb A1c MFr Bld: 8.6 % — ABNORMAL HIGH (ref 4.6–6.5)

## 2021-10-01 LAB — TSH: TSH: 1.56 u[IU]/mL (ref 0.35–5.50)

## 2021-10-02 ENCOUNTER — Encounter: Payer: Self-pay | Admitting: Neurology

## 2021-10-02 ENCOUNTER — Encounter: Payer: Self-pay | Admitting: Internal Medicine

## 2021-10-02 NOTE — Telephone Encounter (Signed)
Keep the appt with Dr. Posey Pronto for the leg weakness. She can see Dr. Rush Farmer for the knee issue as well  ?

## 2021-10-03 ENCOUNTER — Encounter: Payer: Self-pay | Admitting: Family Medicine

## 2021-10-05 ENCOUNTER — Encounter: Payer: Self-pay | Admitting: Family Medicine

## 2021-10-08 NOTE — Telephone Encounter (Signed)
I understand. See my other answer about her BP.  ?

## 2021-10-08 NOTE — Telephone Encounter (Signed)
These readings are too low and this is probably the reason she is falling. Tell her to start taking only 1/2 tablet (25 mg) of the Metoprolol BID and let's see what the BP does  ?

## 2021-10-28 DIAGNOSIS — G4733 Obstructive sleep apnea (adult) (pediatric): Secondary | ICD-10-CM | POA: Diagnosis not present

## 2021-11-07 ENCOUNTER — Ambulatory Visit (INDEPENDENT_AMBULATORY_CARE_PROVIDER_SITE_OTHER): Payer: BC Managed Care – PPO | Admitting: Internal Medicine

## 2021-11-07 ENCOUNTER — Encounter: Payer: Self-pay | Admitting: Internal Medicine

## 2021-11-07 VITALS — BP 112/78 | HR 74 | Ht 64.0 in | Wt 206.5 lb

## 2021-11-07 DIAGNOSIS — E785 Hyperlipidemia, unspecified: Secondary | ICD-10-CM

## 2021-11-07 DIAGNOSIS — E1142 Type 2 diabetes mellitus with diabetic polyneuropathy: Secondary | ICD-10-CM

## 2021-11-07 MED ORDER — GLIMEPIRIDE 1 MG PO TABS: 1.0000 mg | ORAL_TABLET | Freq: Every day | ORAL | 3 refills | Status: DC

## 2021-11-07 MED ORDER — RYBELSUS 7 MG PO TABS: 1.0000 | ORAL_TABLET | Freq: Every day | ORAL | 3 refills | Status: DC

## 2021-11-07 MED ORDER — METFORMIN HCL ER 500 MG PO TB24
500.0000 mg | ORAL_TABLET | Freq: Two times a day (BID) | ORAL | 3 refills | Status: DC
Start: 2021-11-07 — End: 2022-04-08

## 2021-11-07 NOTE — Progress Notes (Signed)
?  ? ? ?Name: Morgan [name]  ?Age/ Sex: 56 y.o., female   ?MRN/ DOB: [id number], [birth year]    ? ?PCP: Laurey Morale, MD   ?Reason for Endocrinology Evaluation: Type 2 Diabetes Mellitus  ?Initial Endocrine Consultative Visit: 02/16/2019  ? ? ?PATIENT IDENTIFIER: [name] is a 57 y.o. female with a past medical history of HTN, OSA,T2DM,and obesity, Ovarian cancer(S/P surgery )  and melanoma. The patient has followed with Endocrinology clinic since 02/16/2019 for consultative assistance with management of her diabetes. ? ?DIABETIC HISTORY:  ?[name] was diagnosed with T2DM in 2000. She has been on Metformin, Glipizide and Actos since her diagnosis. Her hemoglobin A1c has ranged from 6.8% in 2018, peaking at 8.3% in 2020. ? ?Rybelsus added in 01/2019 ?Pioglitazone stopped 05/2019 ? ?We attempted to discontinue glipizide in November 2022 due to hypoglycemia and an A1c of 6.1%, but this had to be restarted in May 2023 with an A1c 8.6% ? ? ?Normal 24-hr urinary cortisol 08/2020 ? ?SUBJECTIVE:  ? ?During the last visit (04/2021): A1c 6.1 %.  We are continued Metformin , Rybelsus, and stopped glipizide due to hypoglycemia  ? ?Today (11/07/2021): [name] is here for a  follow up on diabetes management.   She checks her blood sugars occasionally. The patient has not had hypoglycemic episodes since the last clinic visit. This happens in the morning .  ? ?She has been thirsty and admits to drinking sweet tea   ? ?Denies polyuria  ?Denies nausea, vomiting or diarrhea  ? ? ?HOME DIABETES REGIMEN:  ?- Metformin 1000 mg ,half a tablet twice a day  ?- Rybelsus 7 mg daily  ? ? ?METER DOWNLOAD SUMMARY: unable to download ?90 day average 265 ? ?147- 265 mg/dL   ? ?DIABETIC COMPLICATIONS: ?Microvascular complications:  ?  ?Denies: retinopathy, CKD, neuropathy  ?Last eye exam: scheduled 10/2021 ?  ?Macrovascular complications:  ?  ?Denies: CAD, PVD, CVA ? ? ?HISTORY:  ?Past Medical History:  ?Past  Medical History:  ?Diagnosis Date  ? Anal fissure   ? saw Dr. Mercy Riding   ? Anxiety   ? Arthritis   ? Depression   ? Diabetes mellitus   ? GERD (gastroesophageal reflux disease)   ? History of ovarian cancer in adulthood   ? Hyperlipidemia   ? Hypertension   ? Ovarian cancer ([place])   ? Skin cancer (melanoma) ([place])   ? Sleep apnea   ? uses CPAP   ? Tubular adenoma of colon 01/2014  ? ?Past Surgical History:  ?Past Surgical History:  ?Procedure Laterality Date  ? ABDOMINAL HYSTERECTOMY  06/30/1984  ? BSO  ? BASAL CELL CARCINOMA EXCISION  10/09/2012  ? lower lip per Dr. Link Snuffer   ? COLONOSCOPY  03/23/2019  ? per Dr. Havery Moros, adenomatous polyps, repeat in 3 yrs  ? GLBS tumor ovary 1986    ? HEMORRHOID SURGERY    ? HIP FRACTURE SURGERY Right   ? MELANOMA EXCISION  07/01/1995  ? upper chest   ? ?Social History:  reports that she has never smoked. She has never used smokeless tobacco. She reports that she does not drink alcohol and does not use drugs. ?Family History:  ?Family History  ?Problem Relation Age of Onset  ? Asthma Mother   ? Ovarian cancer Sister   ? Diabetes Brother   ? Diabetes Brother   ? Diabetes Maternal Grandfather   ? Colon cancer Paternal Grandfather   ? Arthritis Other   ?  Prostate cancer Other   ?     grandfather per pt but she is unaware of which grandfather  ? Coronary artery disease Other   ? Pancreatic cancer Neg Hx   ? Stomach cancer Neg Hx   ? Esophageal cancer Neg Hx   ? Rectal cancer Neg Hx   ? Colon polyps Neg Hx   ? ? ? ?HOME MEDICATIONS: ?Allergies as of 11/07/2021   ? ?   Reactions  ? Lisinopril Cough  ? ?  ? ?  ?Medication List  ?  ? ?  ? Accurate as of Nov 07, 2021  2:33 PM. If you have any questions, ask your nurse or doctor.  ?  ?  ? ?  ? ?albuterol 108 (90 Base) MCG/ACT inhaler ?Commonly known as: VENTOLIN HFA ?Inhale 2 puffs into the lungs every 6 (six) hours as needed for wheezing or shortness of breath. ?  ?ALPRAZolam 1 MG tablet ?Commonly known as: Duanne Moron ?Take 1 tablet (1  mg total) by mouth at bedtime as needed for sleep. ?  ?atorvastatin 80 MG tablet ?Commonly known as: LIPITOR ?Take 1 tablet (80 mg total) by mouth daily. ?  ?diclofenac 75 MG EC tablet ?Commonly known as: VOLTAREN ?Take 75 mg by mouth 2 (two) times daily. ?  ?losartan-hydrochlorothiazide 50-12.5 MG tablet ?Commonly known as: HYZAAR ?Take 1 tablet by mouth daily. ?  ?metFORMIN 1000 MG tablet ?Commonly known as: GLUCOPHAGE ?Take 1 tablet (1,000 mg total) by mouth 2 (two) times daily with a meal. ?  ?metoprolol tartrate 50 MG tablet ?Commonly known as: LOPRESSOR ?Take 1 tablet (50 mg total) by mouth 2 (two) times daily. ?  ?omeprazole 40 MG capsule ?Commonly known as: PRILOSEC ?Take 1 capsule (40 mg total) by mouth in the morning and at bedtime. ?  ?Rybelsus 7 MG Tabs ?Generic drug: Semaglutide ?TAKE 1 TABLET DAILY BEFORE BREAKFAST ?  ?venlafaxine XR 150 MG 24 hr capsule ?Commonly known as: EFFEXOR-XR ?Take one tablet by mouth daily. ?  ? ?  ? ? ? ?OBJECTIVE:  ? ?Vital Signs: BP 112/78 (BP Location: Left Arm, Patient Position: Sitting, Cuff Size: Small)   Pulse 74   Ht '5\' 4"'$  (1.626 m)   Wt 206 lb 8 oz (93.7 kg)   LMP 03/22/1986   SpO2 96%   BMI 35.45 kg/m?  ? ?Wt Readings from Last 3 Encounters:  ?11/07/21 206 lb 8 oz (93.7 kg)  ?09/30/21 206 lb (93.4 kg)  ?07/23/21 208 lb (94.3 kg)  ? ? ? ?Exam: ?General: Pt appears well and is in NAD  ?Lungs: Clear with good BS bilat with no rales, rhonchi, or wheezes  ?Heart: RRR with normal S1 and S2 and no gallops; no murmurs; no rub  ?Abdomen: Normoactive bowel sounds, soft, nontender, without masses or organomegaly palpable  ?Extremities: No pretibial edema.  ?Skin: Normal texture and temperature to palpation.  ?Neuro: MS is good with appropriate affect, pt is alert and Ox3  ? ? ?  DM Foot exam 11/07/2021 ?The skin of the feet is intact without sores or ulcerations. ?The pedal pulses are 2+ on right and 2+ on left. ?The sensation is absent on the right  to a screening 5.07,  10 gram monofilament and decreased on the left  ? ? ?DATA REVIEWED: ? ?Lab Results  ?Component Value Date  ? HGBA1C 8.6 (H) 09/30/2021  ? HGBA1C 6.1 (A) 05/10/2021  ? HGBA1C 5.9 (A) 09/04/2020  ? ? ? Latest Reference Range & Units 09/30/21 16:33  ?  Total CHOL/HDL Ratio  4  ?Cholesterol 0 - 200 mg/dL 164  ?HDL Cholesterol >39.00 mg/dL 42.60  ?Direct LDL mg/dL 77.0  ?NonHDL  121.35  ?Triglycerides 0.0 - 149.0 mg/dL 304.0 (H)  ?VLDL 0.0 - 40.0 mg/dL 60.8 (H)  ? ? Latest Reference Range & Units 09/30/21 16:33  ?Sodium 135 - 145 mEq/L 141  ?Potassium 3.5 - 5.1 mEq/L 3.5  ?Chloride 96 - 112 mEq/L 99  ?CO2 19 - 32 mEq/L 32  ?Glucose 70 - 99 mg/dL 141 (H)  ?BUN 6 - 23 mg/dL 20  ?Creatinine 0.40 - 1.20 mg/dL 0.88  ?Calcium 8.4 - 10.5 mg/dL 9.9  ?Alkaline Phosphatase 39 - 117 U/L 54  ?Albumin 3.5 - 5.2 g/dL 4.5  ?AST 0 - 37 U/L 17  ?ALT 0 - 35 U/L 16  ?Total Protein 6.0 - 8.3 g/dL 7.3  ?Bilirubin, Direct 0.0 - 0.3 mg/dL 0.1  ?Total Bilirubin 0.2 - 1.2 mg/dL 0.5  ?GFR >60.00 mL/min 72.67  ? ? ? ? ?ASSESSMENT / PLAN / RECOMMENDATIONS:  ? ?1) Type 2 Diabetes Mellitus, Poorly   Controlled, With Neuropathic  complications - Most recent A1c of 8.6%. Goal A1c < 7.0 %.  ? ?-Patient with hypoglycemia due to discontinuation of sulfonylurea, patient does continue with occasional dietary indiscretions ?-She was seeing a therapist which helped with her depression but the visits became cost prohibitive, and she medicates by eating ?-She was having hypoglycemic episodes with glipizide 5 mg, we opted to start glimepiride, she does understand the risk of hypoglycemia, she was advised to take it 20-30 minutes before first meal of the day ?-I am also going to change her metformin from 1000 mg tabs to 500 mg tabs that way she does not have to cut them ?-She is intolerant to higher doses of metformin ? ?MEDICATIONS: ? ?- Start Glimepiride 1 mg daily  ?- Change  Metformin 500 mg XR, 1 tablet BID   ?- Continue Rybelsus 7 mg daily  ? ? ?EDUCATION /  INSTRUCTIONS: ?BG monitoring instructions: Patient is instructed to check her blood sugars 3 times a week, fasting. ?Call [place] [facility] if: BG persistently < 70  ?I reviewed the Rule of 15 for the

## 2021-11-07 NOTE — Patient Instructions (Addendum)
-   Start Glimepiride 1 mg , 1 tablet daily ?- Change Metformin 500 mg 1 tablet twice daily ?- Continue Rybelsus 7 mg daily  ? ? ?HOW TO TREAT LOW BLOOD SUGARS (Blood sugar LESS THAN 70 MG/DL) ?Please follow the RULE OF 15 for the treatment of hypoglycemia treatment (when your (blood sugars are less than 70 mg/dL)  ? ?STEP 1: Take 15 grams of carbohydrates when your blood sugar is low, which includes:  ?3-4 GLUCOSE TABS  OR ?3-4 OZ OF JUICE OR REGULAR SODA OR ?ONE TUBE OF GLUCOSE GEL   ? ?STEP 2: RECHECK blood sugar in 15 MINUTES ?STEP 3: If your blood sugar is still low at the 15 minute recheck --> then, go back to STEP 1 and treat AGAIN with another 15 grams of carbohydrates. ? ?

## 2021-11-14 ENCOUNTER — Encounter: Payer: Self-pay | Admitting: Internal Medicine

## 2021-11-24 ENCOUNTER — Encounter: Payer: Self-pay | Admitting: Orthopaedic Surgery

## 2021-11-24 ENCOUNTER — Encounter: Payer: Self-pay | Admitting: Family Medicine

## 2021-11-26 NOTE — Telephone Encounter (Signed)
Noted  

## 2021-11-28 DIAGNOSIS — G4733 Obstructive sleep apnea (adult) (pediatric): Secondary | ICD-10-CM | POA: Diagnosis not present

## 2021-12-02 ENCOUNTER — Telehealth: Payer: Self-pay | Admitting: Orthopaedic Surgery

## 2021-12-02 NOTE — Telephone Encounter (Signed)
Called pt 1X and left a message that pt left a mychart message asking to see Dr. Ninfa Linden for hand issues.

## 2021-12-03 ENCOUNTER — Encounter: Payer: Self-pay | Admitting: Family Medicine

## 2021-12-03 DIAGNOSIS — H52213 Irregular astigmatism, bilateral: Secondary | ICD-10-CM | POA: Diagnosis not present

## 2021-12-03 DIAGNOSIS — E119 Type 2 diabetes mellitus without complications: Secondary | ICD-10-CM | POA: Diagnosis not present

## 2021-12-03 DIAGNOSIS — H524 Presbyopia: Secondary | ICD-10-CM | POA: Diagnosis not present

## 2021-12-03 LAB — HM DIABETES EYE EXAM

## 2021-12-04 ENCOUNTER — Encounter: Payer: Self-pay | Admitting: Family Medicine

## 2021-12-04 ENCOUNTER — Other Ambulatory Visit: Payer: Self-pay

## 2021-12-04 ENCOUNTER — Telehealth: Payer: Self-pay

## 2021-12-04 ENCOUNTER — Ambulatory Visit (INDEPENDENT_AMBULATORY_CARE_PROVIDER_SITE_OTHER): Payer: BC Managed Care – PPO | Admitting: Family Medicine

## 2021-12-04 VITALS — BP 118/78 | HR 75 | Temp 98.0°F | Wt 213.1 lb

## 2021-12-04 DIAGNOSIS — R29898 Other symptoms and signs involving the musculoskeletal system: Secondary | ICD-10-CM

## 2021-12-04 DIAGNOSIS — S8002XA Contusion of left knee, initial encounter: Secondary | ICD-10-CM

## 2021-12-04 DIAGNOSIS — R296 Repeated falls: Secondary | ICD-10-CM

## 2021-12-04 DIAGNOSIS — I10 Essential (primary) hypertension: Secondary | ICD-10-CM

## 2021-12-04 MED ORDER — METOPROLOL TARTRATE 50 MG PO TABS: 50.0000 mg | ORAL_TABLET | Freq: Two times a day (BID) | ORAL | 1 refills | Status: DC

## 2021-12-04 NOTE — Progress Notes (Signed)
   Subjective:    Patient ID: Morgan [name], female    DOB: 1965/12/10, 56 y.o.   MRN: [id number]  HPI Here to check on her leg after a fall at work this morning. She says she was simply walking on level ground and suddenly fell to one side. She did not trip and she did not feel dizzy or lightheaded. She struck the left knee on the floor and she has had a sore spot since then. She has had a number of such falls in the last few months, and she is scheduled to see Dr. Posey Pronto on 03-21-22 for a neurologic exam. Of note Gray's father was diagnosed with MS while in his 81's.      Objective:   Physical Exam Constitutional:      General: She is not in acute distress.    Appearance: Normal appearance.  Cardiovascular:     Rate and Rhythm: Normal rate and regular rhythm.     Pulses: Normal pulses.     Heart sounds: Normal heart sounds.  Pulmonary:     Effort: Pulmonary effort is normal.     Breath sounds: Normal breath sounds.  Musculoskeletal:     Comments: There is a small area of erythema and tenderness on the lateral left knee   Neurological:     General: No focal deficit present.     Mental Status: She is alert and oriented to person, place, and time.     Gait: Gait normal.          Assessment & Plan:  She has a mild knee contusion which should heal up just fine. However her recurrent falls seem to be related to muscular weakness and her legs giving out more than anything else. She will see Neurology as above. [name], MD

## 2021-12-04 NOTE — Telephone Encounter (Signed)
--  Caller states she keeps falling, and was requested to go see Dr. Posey Pronto but she can't get in til September, she fell again over the rail (today) and she needs to know what she can do. Caller states she has no symptoms or injury from fall this morning  12/04/2021 11:24:23 AM See HCP within 4 Hours (or PCP triage) Doyle Askew, RN, [name]  Comments User: Melene Muller, RN Date/Time Eilene Ghazi Time): 12/04/2021 11:24:18 AM Caller warm transferred to [place] at office - appt scheduled for 2:45 today  Referrals REFERRED TO PCP OFFICE

## 2021-12-05 NOTE — Telephone Encounter (Signed)
I referred her to [facility] instead

## 2021-12-12 ENCOUNTER — Telehealth: Payer: Self-pay | Admitting: Family Medicine

## 2021-12-12 ENCOUNTER — Encounter: Payer: Self-pay | Admitting: Family Medicine

## 2021-12-12 ENCOUNTER — Other Ambulatory Visit: Payer: Self-pay

## 2021-12-12 DIAGNOSIS — F419 Anxiety disorder, unspecified: Secondary | ICD-10-CM

## 2021-12-12 MED ORDER — VENLAFAXINE HCL ER 150 MG PO CP24: ORAL_CAPSULE | ORAL | 3 refills | Status: DC

## 2021-12-12 NOTE — Telephone Encounter (Signed)
Refill sent to [facility].  

## 2021-12-12 NOTE — Telephone Encounter (Signed)
Pt called requesting refill for  venlafaxine XR (EFFEXOR-XR) 150 MG 24 hr capsule Please send to: [place], [place] Phone:  [phone]  Fax:  [phone]

## 2021-12-13 ENCOUNTER — Other Ambulatory Visit: Payer: Self-pay | Admitting: Family Medicine

## 2021-12-13 DIAGNOSIS — F419 Anxiety disorder, unspecified: Secondary | ICD-10-CM

## 2021-12-13 DIAGNOSIS — I10 Essential (primary) hypertension: Secondary | ICD-10-CM

## 2021-12-13 DIAGNOSIS — F32A Depression, unspecified: Secondary | ICD-10-CM

## 2021-12-16 ENCOUNTER — Telehealth: Payer: Self-pay | Admitting: Family Medicine

## 2021-12-16 ENCOUNTER — Ambulatory Visit (INDEPENDENT_AMBULATORY_CARE_PROVIDER_SITE_OTHER): Payer: BC Managed Care – PPO | Admitting: Physician Assistant

## 2021-12-16 ENCOUNTER — Encounter: Payer: Self-pay | Admitting: Orthopaedic Surgery

## 2021-12-16 DIAGNOSIS — G5602 Carpal tunnel syndrome, left upper limb: Secondary | ICD-10-CM

## 2021-12-16 MED ORDER — METHYLPREDNISOLONE ACETATE 40 MG/ML IJ SUSP
40.0000 mg | INTRAMUSCULAR | Status: AC | PRN
  Administered 2021-12-16: 40 mg

## 2021-12-16 MED ORDER — LIDOCAINE HCL 1 % IJ SOLN
1.0000 mL | INTRAMUSCULAR | Status: AC | PRN
  Administered 2021-12-16: 1 mL

## 2021-12-16 NOTE — Telephone Encounter (Signed)
Pt is calling and dr patel is on maternity leave and will not be back until sept. Pt has an appt with dr patel on 02-28-2022 and pt was referred to [facility] and they will not see pt's who is est with another neurologist. Pt said she almost fall again. Please advise

## 2021-12-16 NOTE — Progress Notes (Signed)
   Procedure Note  Patient: Morgan [name]             Date of Birth: 1965-12-05           MRN: [id number]             Visit Date: 12/16/2021  HPI : Morgan [name] comes in today requesting cortisone injection for carpal tunnel syndrome.  She has carpal tunnel syndrome in both hands.  States left is worse than the right.  She she does wear the brace at night to help with her carpal tunnel she also has found that Biofreeze, Voltaren gel and ibuprofen help.  She is diabetic reports that she had poor control at her most recent hemoglobin A1c he was started on new medicines and since then her glucose levels have been under better control.  Review of systems: See HPI  Physical exam: General well-developed well-nourished female no acute distress.  Bilateral hands: Subjective decrease sensation throughout the median nerve distribution left hand.  Negative Tinel's over the median nerve bilateral hands.  Radial pulses 2+ bilaterally equal symmetric     Procedures: Visit Diagnoses:  1. Carpal tunnel syndrome, left upper limb     Hand/UE Inj: L carpal tunnel for carpal tunnel syndrome on 12/16/2021 4:05 PM Medications: 1 mL lidocaine 1 %; 40 mg methylPREDNISolone acetate 40 MG/ML    Plan: She will monitor her glucose levels closely over the next 24 to 48 hours.  She understands that she needs to wait least 2 weeks before undergoing right carpal tunnel injection.  Questions were encouraged and answered at length.

## 2021-12-16 NOTE — Telephone Encounter (Signed)
See my answer to the telephone call she gave [place] today

## 2021-12-16 NOTE — Telephone Encounter (Signed)
I would think we can cover for Dr. Posey Pronto until she comes back. Is there anything special that Dr. Posey Pronto was going to do before September?

## 2021-12-16 NOTE — Telephone Encounter (Signed)
Please advise 

## 2021-12-17 NOTE — Telephone Encounter (Signed)
Send Dr Sarajane Jews response to pt via MyChart

## 2021-12-18 NOTE — Telephone Encounter (Signed)
Noted  

## 2021-12-19 DIAGNOSIS — M9904 Segmental and somatic dysfunction of sacral region: Secondary | ICD-10-CM | POA: Diagnosis not present

## 2021-12-19 DIAGNOSIS — M9903 Segmental and somatic dysfunction of lumbar region: Secondary | ICD-10-CM | POA: Diagnosis not present

## 2021-12-19 DIAGNOSIS — M9902 Segmental and somatic dysfunction of thoracic region: Secondary | ICD-10-CM | POA: Diagnosis not present

## 2021-12-19 DIAGNOSIS — M5417 Radiculopathy, lumbosacral region: Secondary | ICD-10-CM | POA: Diagnosis not present

## 2021-12-20 DIAGNOSIS — M9904 Segmental and somatic dysfunction of sacral region: Secondary | ICD-10-CM | POA: Diagnosis not present

## 2021-12-20 DIAGNOSIS — M9903 Segmental and somatic dysfunction of lumbar region: Secondary | ICD-10-CM | POA: Diagnosis not present

## 2021-12-20 DIAGNOSIS — M5417 Radiculopathy, lumbosacral region: Secondary | ICD-10-CM | POA: Diagnosis not present

## 2021-12-20 DIAGNOSIS — M9902 Segmental and somatic dysfunction of thoracic region: Secondary | ICD-10-CM | POA: Diagnosis not present

## 2021-12-21 ENCOUNTER — Encounter: Payer: Self-pay | Admitting: Internal Medicine

## 2021-12-21 ENCOUNTER — Encounter: Payer: Self-pay | Admitting: Family Medicine

## 2021-12-24 ENCOUNTER — Encounter: Payer: Self-pay | Admitting: Family Medicine

## 2021-12-24 DIAGNOSIS — M9903 Segmental and somatic dysfunction of lumbar region: Secondary | ICD-10-CM | POA: Diagnosis not present

## 2021-12-24 DIAGNOSIS — M5417 Radiculopathy, lumbosacral region: Secondary | ICD-10-CM | POA: Diagnosis not present

## 2021-12-24 DIAGNOSIS — M9902 Segmental and somatic dysfunction of thoracic region: Secondary | ICD-10-CM | POA: Diagnosis not present

## 2021-12-24 DIAGNOSIS — M9904 Segmental and somatic dysfunction of sacral region: Secondary | ICD-10-CM | POA: Diagnosis not present

## 2021-12-25 ENCOUNTER — Other Ambulatory Visit: Payer: Self-pay | Admitting: Family Medicine

## 2021-12-25 ENCOUNTER — Encounter: Payer: Self-pay | Admitting: Family Medicine

## 2021-12-25 DIAGNOSIS — E785 Hyperlipidemia, unspecified: Secondary | ICD-10-CM

## 2021-12-26 DIAGNOSIS — M9902 Segmental and somatic dysfunction of thoracic region: Secondary | ICD-10-CM | POA: Diagnosis not present

## 2021-12-26 DIAGNOSIS — M5417 Radiculopathy, lumbosacral region: Secondary | ICD-10-CM | POA: Diagnosis not present

## 2021-12-26 DIAGNOSIS — M9903 Segmental and somatic dysfunction of lumbar region: Secondary | ICD-10-CM | POA: Diagnosis not present

## 2021-12-26 DIAGNOSIS — M9904 Segmental and somatic dysfunction of sacral region: Secondary | ICD-10-CM | POA: Diagnosis not present

## 2021-12-27 DIAGNOSIS — M5417 Radiculopathy, lumbosacral region: Secondary | ICD-10-CM | POA: Diagnosis not present

## 2021-12-27 DIAGNOSIS — M9902 Segmental and somatic dysfunction of thoracic region: Secondary | ICD-10-CM | POA: Diagnosis not present

## 2021-12-27 DIAGNOSIS — M9903 Segmental and somatic dysfunction of lumbar region: Secondary | ICD-10-CM | POA: Diagnosis not present

## 2021-12-27 DIAGNOSIS — M9904 Segmental and somatic dysfunction of sacral region: Secondary | ICD-10-CM | POA: Diagnosis not present

## 2021-12-28 DIAGNOSIS — G4733 Obstructive sleep apnea (adult) (pediatric): Secondary | ICD-10-CM | POA: Diagnosis not present

## 2021-12-30 NOTE — Telephone Encounter (Signed)
If her shoulder is still bothering her, I suggest she see her Orthopedist for this

## 2022-01-03 DIAGNOSIS — M9904 Segmental and somatic dysfunction of sacral region: Secondary | ICD-10-CM | POA: Diagnosis not present

## 2022-01-03 DIAGNOSIS — M9902 Segmental and somatic dysfunction of thoracic region: Secondary | ICD-10-CM | POA: Diagnosis not present

## 2022-01-03 DIAGNOSIS — M5417 Radiculopathy, lumbosacral region: Secondary | ICD-10-CM | POA: Diagnosis not present

## 2022-01-03 DIAGNOSIS — M9903 Segmental and somatic dysfunction of lumbar region: Secondary | ICD-10-CM | POA: Diagnosis not present

## 2022-01-06 ENCOUNTER — Encounter: Payer: Self-pay | Admitting: Physician Assistant

## 2022-01-06 ENCOUNTER — Ambulatory Visit (INDEPENDENT_AMBULATORY_CARE_PROVIDER_SITE_OTHER): Payer: BC Managed Care – PPO | Admitting: Physician Assistant

## 2022-01-06 DIAGNOSIS — G5601 Carpal tunnel syndrome, right upper limb: Secondary | ICD-10-CM | POA: Diagnosis not present

## 2022-01-06 MED ORDER — METHYLPREDNISOLONE ACETATE 40 MG/ML IJ SUSP
20.0000 mg | INTRAMUSCULAR | Status: AC | PRN
  Administered 2022-01-06: 20 mg

## 2022-01-06 MED ORDER — LIDOCAINE HCL 1 % IJ SOLN
0.5000 mL | INTRAMUSCULAR | Status: AC | PRN
  Administered 2022-01-06: .5 mL

## 2022-01-06 NOTE — Progress Notes (Signed)
   Procedure Note  Patient: Morgan [name]             Date of Birth: Oct 08, 1965           MRN: [id number]             Visit Date: 01/06/2022 HPI: Patient returns today status post left carpal tunnel injection 12/16/2021.  She states that she is no longer having any numbness tingling left hand.  She is asking for right carpal tunnel injection today.  She states that the last injection did not raise her glucose levels much.  She also is asking about possible leg length discrepancy she is seeing a chiropractor and has been told that she has a leg length discrepancy.  She is status post right total hip arthroplasty years ago done by Dr. Ninfa Linden.  She is having no pain in the hip whatsoever.  Review of systems see HPI.  Right hand: No rashes skin lesions ulcerations radial pulse 2+ Procedures: Visit Diagnoses:  1. Carpal tunnel syndrome, right upper limb     Hand/UE Inj: R carpal tunnel for carpal tunnel syndrome on 01/06/2022 3:42 PM Details: volar approach Medications: 0.5 mL lidocaine 1 %; 20 mg methylPREDNISolone acetate 40 MG/ML    Plan: She will follow-up with [place] on an as-needed basis.  She knows to wait at least 3 months between cortisone injection for the carpal tunnel syndrome.  Questions were encouraged and answered.  Did lie her down on the table today and her leg lengths are equal also with her standing.  Leg lengths are equal.

## 2022-01-08 DIAGNOSIS — M9903 Segmental and somatic dysfunction of lumbar region: Secondary | ICD-10-CM | POA: Diagnosis not present

## 2022-01-08 DIAGNOSIS — M9904 Segmental and somatic dysfunction of sacral region: Secondary | ICD-10-CM | POA: Diagnosis not present

## 2022-01-08 DIAGNOSIS — M5417 Radiculopathy, lumbosacral region: Secondary | ICD-10-CM | POA: Diagnosis not present

## 2022-01-08 DIAGNOSIS — M9902 Segmental and somatic dysfunction of thoracic region: Secondary | ICD-10-CM | POA: Diagnosis not present

## 2022-01-09 ENCOUNTER — Other Ambulatory Visit: Payer: Self-pay | Admitting: Physician Assistant

## 2022-01-09 ENCOUNTER — Encounter: Payer: Self-pay | Admitting: Family Medicine

## 2022-01-09 DIAGNOSIS — M9902 Segmental and somatic dysfunction of thoracic region: Secondary | ICD-10-CM | POA: Diagnosis not present

## 2022-01-09 DIAGNOSIS — M5417 Radiculopathy, lumbosacral region: Secondary | ICD-10-CM | POA: Diagnosis not present

## 2022-01-09 DIAGNOSIS — M9903 Segmental and somatic dysfunction of lumbar region: Secondary | ICD-10-CM | POA: Diagnosis not present

## 2022-01-09 DIAGNOSIS — M9904 Segmental and somatic dysfunction of sacral region: Secondary | ICD-10-CM | POA: Diagnosis not present

## 2022-01-10 MED ORDER — ALPRAZOLAM 1 MG PO TABS: 1.0000 mg | ORAL_TABLET | Freq: Every evening | ORAL | 1 refills | Status: DC | PRN

## 2022-01-10 NOTE — Telephone Encounter (Signed)
Done

## 2022-01-11 ENCOUNTER — Encounter: Payer: Self-pay | Admitting: Family Medicine

## 2022-01-13 DIAGNOSIS — M9902 Segmental and somatic dysfunction of thoracic region: Secondary | ICD-10-CM | POA: Diagnosis not present

## 2022-01-13 DIAGNOSIS — M9901 Segmental and somatic dysfunction of cervical region: Secondary | ICD-10-CM | POA: Diagnosis not present

## 2022-01-13 DIAGNOSIS — M9905 Segmental and somatic dysfunction of pelvic region: Secondary | ICD-10-CM | POA: Diagnosis not present

## 2022-01-13 DIAGNOSIS — M7662 Achilles tendinitis, left leg: Secondary | ICD-10-CM | POA: Diagnosis not present

## 2022-01-13 DIAGNOSIS — M9903 Segmental and somatic dysfunction of lumbar region: Secondary | ICD-10-CM | POA: Diagnosis not present

## 2022-01-13 NOTE — Telephone Encounter (Signed)
She is correct. She should see Dr. Posey Pronto (Neurology) ASAP

## 2022-01-16 ENCOUNTER — Encounter: Payer: Self-pay | Admitting: Gastroenterology

## 2022-01-17 DIAGNOSIS — M9905 Segmental and somatic dysfunction of pelvic region: Secondary | ICD-10-CM | POA: Diagnosis not present

## 2022-01-17 DIAGNOSIS — M9903 Segmental and somatic dysfunction of lumbar region: Secondary | ICD-10-CM | POA: Diagnosis not present

## 2022-01-17 DIAGNOSIS — M9901 Segmental and somatic dysfunction of cervical region: Secondary | ICD-10-CM | POA: Diagnosis not present

## 2022-01-17 DIAGNOSIS — M7662 Achilles tendinitis, left leg: Secondary | ICD-10-CM | POA: Diagnosis not present

## 2022-01-17 DIAGNOSIS — M9902 Segmental and somatic dysfunction of thoracic region: Secondary | ICD-10-CM | POA: Diagnosis not present

## 2022-01-20 DIAGNOSIS — M9906 Segmental and somatic dysfunction of lower extremity: Secondary | ICD-10-CM | POA: Diagnosis not present

## 2022-01-20 DIAGNOSIS — M9905 Segmental and somatic dysfunction of pelvic region: Secondary | ICD-10-CM | POA: Diagnosis not present

## 2022-01-20 DIAGNOSIS — M9902 Segmental and somatic dysfunction of thoracic region: Secondary | ICD-10-CM | POA: Diagnosis not present

## 2022-01-20 DIAGNOSIS — M9903 Segmental and somatic dysfunction of lumbar region: Secondary | ICD-10-CM | POA: Diagnosis not present

## 2022-01-20 DIAGNOSIS — M9901 Segmental and somatic dysfunction of cervical region: Secondary | ICD-10-CM | POA: Diagnosis not present

## 2022-01-23 DIAGNOSIS — M9903 Segmental and somatic dysfunction of lumbar region: Secondary | ICD-10-CM | POA: Diagnosis not present

## 2022-01-23 DIAGNOSIS — M9902 Segmental and somatic dysfunction of thoracic region: Secondary | ICD-10-CM | POA: Diagnosis not present

## 2022-01-23 DIAGNOSIS — M9904 Segmental and somatic dysfunction of sacral region: Secondary | ICD-10-CM | POA: Diagnosis not present

## 2022-01-23 DIAGNOSIS — M5417 Radiculopathy, lumbosacral region: Secondary | ICD-10-CM | POA: Diagnosis not present

## 2022-01-24 DIAGNOSIS — M9906 Segmental and somatic dysfunction of lower extremity: Secondary | ICD-10-CM | POA: Diagnosis not present

## 2022-01-24 DIAGNOSIS — M9901 Segmental and somatic dysfunction of cervical region: Secondary | ICD-10-CM | POA: Diagnosis not present

## 2022-01-24 DIAGNOSIS — M9903 Segmental and somatic dysfunction of lumbar region: Secondary | ICD-10-CM | POA: Diagnosis not present

## 2022-01-24 DIAGNOSIS — M9905 Segmental and somatic dysfunction of pelvic region: Secondary | ICD-10-CM | POA: Diagnosis not present

## 2022-01-24 DIAGNOSIS — M9902 Segmental and somatic dysfunction of thoracic region: Secondary | ICD-10-CM | POA: Diagnosis not present

## 2022-01-27 DIAGNOSIS — M9905 Segmental and somatic dysfunction of pelvic region: Secondary | ICD-10-CM | POA: Diagnosis not present

## 2022-01-27 DIAGNOSIS — M9903 Segmental and somatic dysfunction of lumbar region: Secondary | ICD-10-CM | POA: Diagnosis not present

## 2022-01-27 DIAGNOSIS — G4733 Obstructive sleep apnea (adult) (pediatric): Secondary | ICD-10-CM | POA: Diagnosis not present

## 2022-01-27 DIAGNOSIS — M9901 Segmental and somatic dysfunction of cervical region: Secondary | ICD-10-CM | POA: Diagnosis not present

## 2022-01-27 DIAGNOSIS — M9902 Segmental and somatic dysfunction of thoracic region: Secondary | ICD-10-CM | POA: Diagnosis not present

## 2022-01-27 DIAGNOSIS — M9906 Segmental and somatic dysfunction of lower extremity: Secondary | ICD-10-CM | POA: Diagnosis not present

## 2022-01-28 DIAGNOSIS — G4733 Obstructive sleep apnea (adult) (pediatric): Secondary | ICD-10-CM | POA: Diagnosis not present

## 2022-01-28 DIAGNOSIS — M9903 Segmental and somatic dysfunction of lumbar region: Secondary | ICD-10-CM | POA: Diagnosis not present

## 2022-01-28 DIAGNOSIS — M9904 Segmental and somatic dysfunction of sacral region: Secondary | ICD-10-CM | POA: Diagnosis not present

## 2022-01-28 DIAGNOSIS — M5417 Radiculopathy, lumbosacral region: Secondary | ICD-10-CM | POA: Diagnosis not present

## 2022-01-28 DIAGNOSIS — M9902 Segmental and somatic dysfunction of thoracic region: Secondary | ICD-10-CM | POA: Diagnosis not present

## 2022-01-29 DIAGNOSIS — M9903 Segmental and somatic dysfunction of lumbar region: Secondary | ICD-10-CM | POA: Diagnosis not present

## 2022-01-29 DIAGNOSIS — M9904 Segmental and somatic dysfunction of sacral region: Secondary | ICD-10-CM | POA: Diagnosis not present

## 2022-01-29 DIAGNOSIS — M5417 Radiculopathy, lumbosacral region: Secondary | ICD-10-CM | POA: Diagnosis not present

## 2022-01-29 DIAGNOSIS — M9902 Segmental and somatic dysfunction of thoracic region: Secondary | ICD-10-CM | POA: Diagnosis not present

## 2022-01-30 ENCOUNTER — Encounter: Payer: Self-pay | Admitting: Neurology

## 2022-01-30 DIAGNOSIS — M9902 Segmental and somatic dysfunction of thoracic region: Secondary | ICD-10-CM | POA: Diagnosis not present

## 2022-01-30 DIAGNOSIS — M9903 Segmental and somatic dysfunction of lumbar region: Secondary | ICD-10-CM | POA: Diagnosis not present

## 2022-01-30 DIAGNOSIS — M9906 Segmental and somatic dysfunction of lower extremity: Secondary | ICD-10-CM | POA: Diagnosis not present

## 2022-01-30 DIAGNOSIS — M9901 Segmental and somatic dysfunction of cervical region: Secondary | ICD-10-CM | POA: Diagnosis not present

## 2022-01-30 DIAGNOSIS — M9905 Segmental and somatic dysfunction of pelvic region: Secondary | ICD-10-CM | POA: Diagnosis not present

## 2022-01-31 DIAGNOSIS — M9905 Segmental and somatic dysfunction of pelvic region: Secondary | ICD-10-CM | POA: Diagnosis not present

## 2022-01-31 DIAGNOSIS — M9903 Segmental and somatic dysfunction of lumbar region: Secondary | ICD-10-CM | POA: Diagnosis not present

## 2022-01-31 DIAGNOSIS — M9902 Segmental and somatic dysfunction of thoracic region: Secondary | ICD-10-CM | POA: Diagnosis not present

## 2022-01-31 DIAGNOSIS — M9901 Segmental and somatic dysfunction of cervical region: Secondary | ICD-10-CM | POA: Diagnosis not present

## 2022-01-31 DIAGNOSIS — M9906 Segmental and somatic dysfunction of lower extremity: Secondary | ICD-10-CM | POA: Diagnosis not present

## 2022-02-03 DIAGNOSIS — M9905 Segmental and somatic dysfunction of pelvic region: Secondary | ICD-10-CM | POA: Diagnosis not present

## 2022-02-03 DIAGNOSIS — M9906 Segmental and somatic dysfunction of lower extremity: Secondary | ICD-10-CM | POA: Diagnosis not present

## 2022-02-03 DIAGNOSIS — M9903 Segmental and somatic dysfunction of lumbar region: Secondary | ICD-10-CM | POA: Diagnosis not present

## 2022-02-03 DIAGNOSIS — M9901 Segmental and somatic dysfunction of cervical region: Secondary | ICD-10-CM | POA: Diagnosis not present

## 2022-02-03 DIAGNOSIS — M9902 Segmental and somatic dysfunction of thoracic region: Secondary | ICD-10-CM | POA: Diagnosis not present

## 2022-02-04 DIAGNOSIS — M5417 Radiculopathy, lumbosacral region: Secondary | ICD-10-CM | POA: Diagnosis not present

## 2022-02-04 DIAGNOSIS — M9903 Segmental and somatic dysfunction of lumbar region: Secondary | ICD-10-CM | POA: Diagnosis not present

## 2022-02-04 DIAGNOSIS — M9902 Segmental and somatic dysfunction of thoracic region: Secondary | ICD-10-CM | POA: Diagnosis not present

## 2022-02-04 DIAGNOSIS — M9904 Segmental and somatic dysfunction of sacral region: Secondary | ICD-10-CM | POA: Diagnosis not present

## 2022-02-07 DIAGNOSIS — M9901 Segmental and somatic dysfunction of cervical region: Secondary | ICD-10-CM | POA: Diagnosis not present

## 2022-02-07 DIAGNOSIS — M9902 Segmental and somatic dysfunction of thoracic region: Secondary | ICD-10-CM | POA: Diagnosis not present

## 2022-02-07 DIAGNOSIS — M9903 Segmental and somatic dysfunction of lumbar region: Secondary | ICD-10-CM | POA: Diagnosis not present

## 2022-02-07 DIAGNOSIS — M9906 Segmental and somatic dysfunction of lower extremity: Secondary | ICD-10-CM | POA: Diagnosis not present

## 2022-02-07 DIAGNOSIS — M9905 Segmental and somatic dysfunction of pelvic region: Secondary | ICD-10-CM | POA: Diagnosis not present

## 2022-02-18 ENCOUNTER — Encounter: Payer: Self-pay | Admitting: Pulmonary Disease

## 2022-02-18 ENCOUNTER — Ambulatory Visit (INDEPENDENT_AMBULATORY_CARE_PROVIDER_SITE_OTHER): Payer: BC Managed Care – PPO | Admitting: Pulmonary Disease

## 2022-02-18 VITALS — BP 100/80 | HR 76 | Ht 64.0 in | Wt 219.8 lb

## 2022-02-18 DIAGNOSIS — G4733 Obstructive sleep apnea (adult) (pediatric): Secondary | ICD-10-CM | POA: Diagnosis not present

## 2022-02-18 NOTE — Patient Instructions (Signed)
Will arrange for new CPAP machine  Follow up in 4 months 

## 2022-02-18 NOTE — Progress Notes (Signed)
Pulmonary, Critical Care, and Sleep Medicine  Chief Complaint  Patient presents with   Follow-up    Follow-up    Constitutional:  BP 100/80 (BP Location: Left Arm)   Pulse 76   Ht '5\' 4"'$  (1.626 m)   Wt 219 lb 12.8 oz (99.7 kg)   LMP 03/22/1986   SpO2 95%   BMI 37.73 kg/m   Past Medical History:  Anxiety, Depression, DM, GERD, Ovarian cancer, HLD, HTN, Melanoma, Colon polyp, COVID 19 infection December 2021  Past Surgical History:  She  has a past surgical history that includes Melanoma excision (07/01/1995); Abdominal hysterectomy (06/30/1984); Hemorrhoid surgery; Hip fracture surgery (Right); Excision basal cell carcinoma (10/09/2012); GLBS tumor ovary 1986; and Colonoscopy (03/23/2019).  Brief Summary:  Morgan [name] is a 56 y.o. female with obstructive sleep apnea.      Subjective:   She uses CPAP nightly.  She is not having sinus congestion or dry mouth.  Current mask is comfortable.  She has trouble falling while walking.  Has happened a few times.  She doesn't pass out.  She has an appointment with neurology, and was seen by chiropractor.  Physical Exam:   Appearance - well kempt   ENMT - no sinus tenderness, no oral exudate, no LAN, Mallampati 3 airway, no stridor  Respiratory - equal breath sounds bilaterally, no wheezing or rales  CV - s1s2 regular rate and rhythm, no murmurs  Ext - no clubbing, no edema  Skin - no rashes  Psych - normal mood and affect    Pulmonary testing:  PFT 07/03/16 >> FEV1 2.57 (90%), FEV1% 92, DLCO 91%  Sleep Tests:  PSG 12/23/16 >> AHI 112, SpO2 low 55% CPAP 11/20/21 to 02/17/22 >> used on 90 of 90 nights with average 6 hrs 57 min.  Average AHI 1.4 with CPAP 15 cm H2O  Social History:  She  reports that she has never smoked. She has never used smokeless tobacco. She reports that she does not drink alcohol and does not use drugs.  Family History:  Her family history includes Arthritis in an other family  member; Asthma in her mother; Colon cancer in her paternal grandfather; Coronary artery disease in an other family member; Diabetes in her brother, brother, and maternal grandfather; Ovarian cancer in her sister; Prostate cancer in an other family member.     Assessment/Plan:   Obstructive sleep apnea. - she is compliant with CPAP and reports benefit from therapy - she uses [employer] for her DME - her current CPAP is more than 58 yrs old - will arrange for a new Remed CPAP at 15 cm H2O  Obesity. - she is aware of how her weight can impact her health, particular in relation to her sleep apnea  Frequent falling. - she will have this assessed further by neurology in September  Time Spent Involved in Patient Care on Day of Examination:  26 minutes  Follow up:   Patient Instructions  Will arrange for new CPAP machine  Follow up in 4 months  Medication List:   Allergies as of 02/18/2022       Reactions   Lisinopril Cough        Medication List        Accurate as of February 18, 2022  4:25 PM. If you have any questions, ask your nurse or doctor.          albuterol 108 (90 Base) MCG/ACT inhaler Commonly known as: VENTOLIN HFA Inhale 2 puffs  into the lungs every 6 (six) hours as needed for wheezing or shortness of breath.   ALPRAZolam 1 MG tablet Commonly known as: XANAX Take 1 tablet (1 mg total) by mouth at bedtime as needed for sleep.   atorvastatin 80 MG tablet Commonly known as: LIPITOR TAKE 1 TABLET DAILY   diclofenac 75 MG EC tablet Commonly known as: VOLTAREN Take 75 mg by mouth 2 (two) times daily.   glimepiride 1 MG tablet Commonly known as: AMARYL Take 1 tablet (1 mg total) by mouth daily with breakfast.   losartan-hydrochlorothiazide 50-12.5 MG tablet Commonly known as: HYZAAR Take 1 tablet by mouth daily.   metFORMIN 500 MG 24 hr tablet Commonly known as: GLUCOPHAGE-XR Take 1 tablet (500 mg total) by mouth in the morning and at bedtime.    metoprolol tartrate 50 MG tablet Commonly known as: LOPRESSOR Take 1 tablet (50 mg total) by mouth 2 (two) times daily.   omeprazole 40 MG capsule Commonly known as: PRILOSEC Take 1 capsule (40 mg total) by mouth 2 (two) times daily. **PLEASE CALL OFFICE TO SCHEDULE FOLLOW UP   Rybelsus 7 MG Tabs Generic drug: Semaglutide Take 1 tablet by mouth daily before breakfast.   venlafaxine XR 150 MG 24 hr capsule Commonly known as: EFFEXOR-XR Take 1 (one) tablet by mouth daily.        Signature:  Chesley Mires, MD [place] Pager - [phone] 02/18/2022, 4:25 PM

## 2022-02-20 ENCOUNTER — Ambulatory Visit (INDEPENDENT_AMBULATORY_CARE_PROVIDER_SITE_OTHER): Payer: BC Managed Care – PPO | Admitting: Podiatry

## 2022-02-20 DIAGNOSIS — M19079 Primary osteoarthritis, unspecified ankle and foot: Secondary | ICD-10-CM | POA: Diagnosis not present

## 2022-02-20 DIAGNOSIS — M778 Other enthesopathies, not elsewhere classified: Secondary | ICD-10-CM | POA: Diagnosis not present

## 2022-02-21 DIAGNOSIS — M9905 Segmental and somatic dysfunction of pelvic region: Secondary | ICD-10-CM | POA: Diagnosis not present

## 2022-02-21 DIAGNOSIS — M9903 Segmental and somatic dysfunction of lumbar region: Secondary | ICD-10-CM | POA: Diagnosis not present

## 2022-02-21 DIAGNOSIS — M9902 Segmental and somatic dysfunction of thoracic region: Secondary | ICD-10-CM | POA: Diagnosis not present

## 2022-02-21 DIAGNOSIS — M9901 Segmental and somatic dysfunction of cervical region: Secondary | ICD-10-CM | POA: Diagnosis not present

## 2022-02-25 DIAGNOSIS — L814 Other melanin hyperpigmentation: Secondary | ICD-10-CM | POA: Diagnosis not present

## 2022-02-25 DIAGNOSIS — L821 Other seborrheic keratosis: Secondary | ICD-10-CM | POA: Diagnosis not present

## 2022-02-25 DIAGNOSIS — D225 Melanocytic nevi of trunk: Secondary | ICD-10-CM | POA: Diagnosis not present

## 2022-02-25 DIAGNOSIS — L578 Other skin changes due to chronic exposure to nonionizing radiation: Secondary | ICD-10-CM | POA: Diagnosis not present

## 2022-02-27 DIAGNOSIS — G4733 Obstructive sleep apnea (adult) (pediatric): Secondary | ICD-10-CM | POA: Diagnosis not present

## 2022-02-27 NOTE — Progress Notes (Signed)
Subjective:  Patient ID: Morgan [name], female    DOB: Nov 04, 1965,  MRN: [id number]  Chief Complaint  Patient presents with   Diabetes    56 y.o. female presents with the above complaint.  Patient presents with left dorsal midfoot pain.  Patient states there is some swelling and pain associate with that she is a diabetic with last A1c of 8.6.  Hurts with ambulation hurts with pressure.  She wanted to get it evaluated she has not seen anyone else prior to seeing me for this.  She would like to discuss treatment options.  Pain scale 7 out of 10 hurts with ambulation hurts with pressure.   Review of Systems: Negative except as noted in the HPI. Denies N/V/F/Ch.  Past Medical History:  Diagnosis Date   Anal fissure    saw Dr. Mercy Riding    Anxiety    Arthritis    Depression    Diabetes mellitus    GERD (gastroesophageal reflux disease)    History of ovarian cancer in adulthood    Hyperlipidemia    Hypertension    Ovarian cancer ([place])    Skin cancer (melanoma) ([facility])    Sleep apnea    uses CPAP    Tubular adenoma of colon 01/2014    Current Outpatient Medications:    albuterol (VENTOLIN HFA) 108 (90 Base) MCG/ACT inhaler, Inhale 2 puffs into the lungs every 6 (six) hours as needed for wheezing or shortness of breath., Disp: 8 g, Rfl: 6   ALPRAZolam (XANAX) 1 MG tablet, Take 1 tablet (1 mg total) by mouth at bedtime as needed for sleep., Disp: 90 tablet, Rfl: 1   atorvastatin (LIPITOR) 80 MG tablet, TAKE 1 TABLET DAILY, Disp: 90 tablet, Rfl: 1   diclofenac (VOLTAREN) 75 MG EC tablet, Take 75 mg by mouth 2 (two) times daily., Disp: , Rfl:    glimepiride (AMARYL) 1 MG tablet, Take 1 tablet (1 mg total) by mouth daily with breakfast., Disp: 90 tablet, Rfl: 3   losartan-hydrochlorothiazide (HYZAAR) 50-12.5 MG tablet, Take 1 tablet by mouth daily., Disp: 90 tablet, Rfl: 1   metFORMIN (GLUCOPHAGE-XR) 500 MG 24 hr tablet, Take 1 tablet (500 mg total) by mouth in the morning  and at bedtime., Disp: 180 tablet, Rfl: 3   metoprolol tartrate (LOPRESSOR) 50 MG tablet, Take 1 tablet (50 mg total) by mouth 2 (two) times daily., Disp: 180 tablet, Rfl: 1   omeprazole (PRILOSEC) 40 MG capsule, Take 1 capsule (40 mg total) by mouth 2 (two) times daily. **PLEASE CALL OFFICE TO SCHEDULE FOLLOW UP, Disp: 180 capsule, Rfl: 0   Semaglutide (RYBELSUS) 7 MG TABS, Take 1 tablet by mouth daily before breakfast., Disp: 90 tablet, Rfl: 3   venlafaxine XR (EFFEXOR-XR) 150 MG 24 hr capsule, Take 1 (one) tablet by mouth daily., Disp: 30 capsule, Rfl: 3  Social History   Tobacco Use  Smoking Status Never  Smokeless Tobacco Never    Allergies  Allergen Reactions   Lisinopril Cough   Objective:  There were no vitals filed for this visit. There is no height or weight on file to calculate BMI. Constitutional Well developed. Well nourished.  Vascular Dorsalis pedis pulses palpable bilaterally. Posterior tibial pulses palpable bilaterally. Capillary refill normal to all digits.  No cyanosis or clubbing noted. Pedal hair growth normal.  Neurologic Normal speech. Oriented to person, place, and time. Epicritic sensation to light touch grossly present bilaterally.  Dermatologic Nails well groomed and normal in appearance. No open wounds.  No skin lesions.  Orthopedic: Pain on palpation left dorsal midfoot.  No pain with extensor or flexor tendinitis.  Lisfranc interval is intact.  Clinically unable to appreciate midfoot arthritis with uneven jagged edges palpable at the level of the epidermis   Radiographs: None Assessment:   1. Capsulitis of left foot   2. Arthritis of midfoot    Plan:  Patient was evaluated and treated and all questions answered.  Left midfoot arthritis/capsulitis -All questions and concerns were discussed with the patient in extensive detail given the amount of pain she would benefit from a steroid injection at decreasing inflammatory component associate with  pain.  Patient agrees with plan like to proceed with steroid injection -A steroid injection was performed at left dorsal midfoot using 1% plain Lidocaine and 10 mg of Kenalog. This was well tolerated. -I discussed shoe gear modification and insole management extensive detail as well   No follow-ups on file.

## 2022-02-28 ENCOUNTER — Encounter: Payer: Self-pay | Admitting: Gastroenterology

## 2022-02-28 ENCOUNTER — Ambulatory Visit (AMBULATORY_SURGERY_CENTER): Payer: Self-pay

## 2022-02-28 VITALS — Ht 64.0 in | Wt 216.0 lb

## 2022-02-28 DIAGNOSIS — Z86010 Personal history of colonic polyps: Secondary | ICD-10-CM

## 2022-02-28 MED ORDER — NA SULFATE-K SULFATE-MG SULF 17.5-3.13-1.6 GM/177ML PO SOLN: 1.0000 | Freq: Once | ORAL | 0 refills | Status: AC

## 2022-02-28 NOTE — Progress Notes (Signed)
No egg or soy allergy known to patient  No issues known to pt with past sedation with any surgeries or procedures Patient denies ever being told they had issues or difficulty with intubation  No FH of Malignant Hyperthermia Pt is not on diet pills Pt is not on home 02  Pt is not on blood thinners  Pt reports issues with constipation - takes daily/every other day Miralax-stool softeners (every couple of weeks), advised to increase oral fluids and activity when she feels constipation coming on; No A fib or A flutter Have any cardiac testing pending--NO Pt instructed to use Single[employer].com or GoodRx for a price reduction on prep  [employer]BS PPO insurance verified during PV;

## 2022-03-05 ENCOUNTER — Encounter: Payer: Self-pay | Admitting: Gastroenterology

## 2022-03-07 ENCOUNTER — Ambulatory Visit (AMBULATORY_SURGERY_CENTER): Payer: BC Managed Care – PPO | Admitting: Gastroenterology

## 2022-03-07 ENCOUNTER — Encounter: Payer: Self-pay | Admitting: Gastroenterology

## 2022-03-07 ENCOUNTER — Encounter: Payer: Self-pay | Admitting: Family Medicine

## 2022-03-07 VITALS — BP 112/70 | HR 62 | Temp 97.7°F | Resp 13 | Ht 64.0 in | Wt 216.0 lb

## 2022-03-07 DIAGNOSIS — Z86010 Personal history of colonic polyps: Secondary | ICD-10-CM

## 2022-03-07 DIAGNOSIS — Z09 Encounter for follow-up examination after completed treatment for conditions other than malignant neoplasm: Secondary | ICD-10-CM | POA: Diagnosis not present

## 2022-03-07 DIAGNOSIS — Z1211 Encounter for screening for malignant neoplasm of colon: Secondary | ICD-10-CM | POA: Diagnosis not present

## 2022-03-07 DIAGNOSIS — D123 Benign neoplasm of transverse colon: Secondary | ICD-10-CM | POA: Diagnosis not present

## 2022-03-07 MED ORDER — SODIUM CHLORIDE 0.9 % IV SOLN: 500.0000 mL | Freq: Once | INTRAVENOUS | Status: DC

## 2022-03-07 NOTE — Progress Notes (Signed)
[facility] History and Physical   Primary Care Physician:  Laurey Morale, MD   Reason for Procedure:   History of colon polyps  Plan:    colonoscopy     HPI: Morgan [name] is a 56 y.o. female  here for colonoscopy surveillance - 8 adenomas removed 03/2019. Patient denies any bowel symptoms at this time. No family history of colon cancer known. Otherwise feels well without any cardiopulmonary symptoms.   I have discussed risks / benefits of anesthesia and endoscopic procedure with [name] and they wish to proceed with the exams as outlined today.    Past Medical History:  Diagnosis Date   Anal fissure    saw Dr. Mercy Riding    Anxiety    on meds   Arthritis    on meds   Asthma    uses inhaler if needed   Depression    on meds   Diabetes mellitus    on meds   GERD (gastroesophageal reflux disease)    on meds   History of ovarian cancer in adulthood    Hyperlipidemia    on meds   Hypertension    on meds   Ovarian cancer [facility] [place]   Skin cancer (melanoma) ([place]) 1997   Sleep apnea    uses CPAP    Tubular adenoma of colon 01/2014    Past Surgical History:  Procedure Laterality Date   ABDOMINAL HYSTERECTOMY  06/30/1984   BSO   BASAL CELL CARCINOMA EXCISION  10/09/2012   lower lip per Dr. Link Snuffer    COLONOSCOPY  03/23/2019   per Dr. Havery Moros, adenomatous polyps, repeat in 3 yrs   GLBS tumor ovary 1986     [place] Right 2007   MELANOMA EXCISION  07/01/1995   upper chest     Prior to Admission medications   Medication Sig Start Date End Date Taking? Authorizing Provider  ALPRAZolam Duanne Moron) 1 MG tablet Take 1 tablet (1 mg total) by mouth at bedtime as needed for sleep. 01/10/22  Yes Laurey Morale, MD  atorvastatin (LIPITOR) 80 MG tablet TAKE 1 TABLET DAILY 12/25/21  Yes Laurey Morale, MD  glimepiride (AMARYL) 1 MG tablet Take 1 tablet (1 mg total) by mouth daily with  breakfast. 11/07/21  Yes Shamleffer, Melanie Crazier, MD  losartan-hydrochlorothiazide (HYZAAR) 50-12.5 MG tablet Take 1 tablet by mouth daily. 09/26/21  Yes Laurey Morale, MD  metFORMIN (GLUCOPHAGE-XR) 500 MG 24 hr tablet Take 1 tablet (500 mg total) by mouth in the morning and at bedtime. 11/07/21  Yes Shamleffer, Melanie Crazier, MD  metoprolol tartrate (LOPRESSOR) 50 MG tablet Take 1 tablet (50 mg total) by mouth 2 (two) times daily. 12/04/21  Yes Laurey Morale, MD  omeprazole (PRILOSEC) 40 MG capsule Take 1 capsule (40 mg total) by mouth 2 (two) times daily. **PLEASE CALL OFFICE TO SCHEDULE FOLLOW UP 01/09/22  Yes Esterwood, Amy S, PA-C  Semaglutide (RYBELSUS) 7 MG TABS Take 1 tablet by mouth daily before breakfast. 11/07/21  Yes Shamleffer, Melanie Crazier, MD  venlafaxine XR (EFFEXOR-XR) 150 MG 24 hr capsule Take 1 (one) tablet by mouth daily. 12/12/21  Yes Laurey Morale, MD  albuterol (VENTOLIN HFA) 108 (90 Base) MCG/ACT inhaler Inhale 2 puffs into the lungs every 6 (six) hours as needed for wheezing or shortness of breath. Patient not taking: Reported on 02/28/2022 01/08/21   Chesley Mires, MD  ibuprofen (ADVIL)  200 MG tablet Take 600 mg by mouth every 6 (six) hours as needed for mild pain.    [provider]  POLYETHYLENE GLYCOL 3350 PO Take 1 packet by mouth 2 (two) times daily.    [provider]    Current Outpatient Medications  Medication Sig Dispense Refill   ALPRAZolam (XANAX) 1 MG tablet Take 1 tablet (1 mg total) by mouth at bedtime as needed for sleep. 90 tablet 1   atorvastatin (LIPITOR) 80 MG tablet TAKE 1 TABLET DAILY 90 tablet 1   glimepiride (AMARYL) 1 MG tablet Take 1 tablet (1 mg total) by mouth daily with breakfast. 90 tablet 3   losartan-hydrochlorothiazide (HYZAAR) 50-12.5 MG tablet Take 1 tablet by mouth daily. 90 tablet 1   metFORMIN (GLUCOPHAGE-XR) 500 MG 24 hr tablet Take 1 tablet (500 mg total) by mouth in the morning and at bedtime. 180 tablet 3    metoprolol tartrate (LOPRESSOR) 50 MG tablet Take 1 tablet (50 mg total) by mouth 2 (two) times daily. 180 tablet 1   omeprazole (PRILOSEC) 40 MG capsule Take 1 capsule (40 mg total) by mouth 2 (two) times daily. **PLEASE CALL OFFICE TO SCHEDULE FOLLOW UP 180 capsule 0   Semaglutide (RYBELSUS) 7 MG TABS Take 1 tablet by mouth daily before breakfast. 90 tablet 3   venlafaxine XR (EFFEXOR-XR) 150 MG 24 hr capsule Take 1 (one) tablet by mouth daily. 30 capsule 3   albuterol (VENTOLIN HFA) 108 (90 Base) MCG/ACT inhaler Inhale 2 puffs into the lungs every 6 (six) hours as needed for wheezing or shortness of breath. (Patient not taking: Reported on 02/28/2022) 8 g 6   ibuprofen (ADVIL) 200 MG tablet Take 600 mg by mouth every 6 (six) hours as needed for mild pain.     POLYETHYLENE GLYCOL 3350 PO Take 1 packet by mouth 2 (two) times daily.     Current Facility-Administered Medications  Medication Dose Route Frequency Provider Last Rate Last Admin   0.9 %  sodium chloride infusion  500 mL Intravenous Once Fallen Crisostomo, [name], MD        Allergies as of 03/07/2022 - Review Complete 03/07/2022  Allergen Reaction Noted   Lisinopril Cough 11/07/2011    Family History  Problem Relation Age of Onset   Asthma Mother    Bladder Cancer Mother 42   Ovarian cancer Sister 59   Diabetes Brother    Diabetes Brother    Diabetes Maternal Grandfather    Colon polyps Paternal Grandmother    Colon cancer Paternal Grandmother    Colon polyps Paternal Grandfather    Arthritis Other    Prostate cancer Other        grandfather per pt but she is unaware of which grandfather   Coronary artery disease Other    Pancreatic cancer Neg Hx    Stomach cancer Neg Hx    Esophageal cancer Neg Hx    Rectal cancer Neg Hx     Social History   Socioeconomic History   Marital status: Single    Spouse name: Not on file   Number of children: Not on file   Years of education: Not on file   Highest education level: Not on  file  Occupational History   Occupation: billing     Employer: [employer]  Tobacco Use   Smoking status: Never   Smokeless tobacco: Never  Vaping Use   Vaping Use: Never used  Substance and Sexual Activity   Alcohol use: No  Alcohol/week: 0.0 standard drinks of alcohol   Drug use: No   Sexual activity: Not on file  Other Topics Concern   Not on file  Social History Narrative   Originally from [place], [place], & moved to [place] at 56 y.o. She reports she started a new job in July 2016. 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 No pets currently. No bird, mold, or hot tub exposure.    Social Determinants of Health   Financial Resource Strain: Not on file  Food Insecurity: Not on file  Transportation Needs: Not on file  Physical Activity: Not on file  Stress: Not on file  Social Connections: Not on file  Intimate Partner Violence: Not on file    Review of Systems: All other review of systems negative except as mentioned in the HPI.  Physical Exam: Vital signs BP 125/80   Pulse 63   Temp 97.7 F (36.5 C)   Resp 19   Ht '5\' 4"'$  (1.626 m)   Wt 216 lb (98 kg)   LMP 03/22/1986   SpO2 96%   BMI 37.08 kg/m   General:   Alert,  Well-developed, pleasant and cooperative in NAD Lungs:  Clear throughout to auscultation.   Heart:  Regular rate and rhythm Abdomen:  Soft, nontender and nondistended.   Neuro/Psych:  Alert and cooperative. Normal mood and affect. A and O x 3  Jolly Mango, MD [facility]

## 2022-03-07 NOTE — Op Note (Signed)
[facility] Patient Name: Morgan [name] Procedure Date: 03/07/2022 9:26 AM MRN: [id number] Endoscopist: Remo Lipps P. Havery Moros , MD Age: 56 Referring MD:  Date of Birth: 17-Jan-1966 Gender: Female Account #: [id number] Procedure:                Colonoscopy Indications:              High risk colon cancer surveillance: Personal                            history of colonic polyps - 8 adenomas removed                            03/2019 Medicines:                Monitored Anesthesia Care Procedure:                Pre-Anesthesia Assessment:                           - Prior to the procedure, a History and Physical                            was performed, and patient medications and                            allergies were reviewed. The patient's tolerance of                            previous anesthesia was also reviewed. The risks                            and benefits of the procedure and the sedation                            options and risks were discussed with the patient.                            All questions were answered, and informed consent                            was obtained. Prior Anticoagulants: The patient has                            taken no previous anticoagulant or antiplatelet                            agents. ASA Grade Assessment: II - A patient with                            mild systemic disease. After reviewing the risks                            and benefits, the patient was deemed in  satisfactory condition to undergo the procedure.                           After obtaining informed consent, the colonoscope                            was passed under direct vision. Throughout the                            procedure, the patient's blood pressure, pulse, and                            oxygen saturations were monitored continuously. The                            Olympus PCF-[id number]) Colonoscope was                             introduced through the anus and advanced to the the                            cecum, identified by appendiceal orifice and                            ileocecal valve. The colonoscopy was performed                            without difficulty. The patient tolerated the                            procedure well. The quality of the bowel                            preparation was good. The ileocecal valve,                            appendiceal orifice, and rectum were photographed. Scope In: 9:37:50 AM Scope Out: 10:00:48 AM Scope Withdrawal Time: 0 hours 17 minutes 15 seconds  Total Procedure Duration: 0 hours 22 minutes 58 seconds  Findings:                 The perianal and digital rectal examinations were                            normal.                           Three sessile polyps were found in the transverse                            colon. The polyps were 3 to 5 mm in size. These                            polyps were removed with a cold snare. Resection  and retrieval were complete.                           A few small-mouthed diverticula were found in the                            transverse colon and left colon.                           Internal hemorrhoids were found during                            retroflexion. The hemorrhoids were moderate.                           The exam was otherwise without abnormality. Complications:            No immediate complications. Estimated blood loss:                            Minimal. Estimated Blood Loss:     Estimated blood loss was minimal. Impression:               - Three 3 to 5 mm polyps in the transverse colon,                            removed with a cold snare. Resected and retrieved.                           - Diverticulosis in the transverse colon and in the                            left colon.                           - Internal hemorrhoids.                            - The examination was otherwise normal. Recommendation:           - Patient has a contact number available for                            emergencies. The signs and symptoms of potential                            delayed complications were discussed with the                            patient. Return to normal activities tomorrow.                            Written discharge instructions were provided to the                            patient.                           -  Resume previous diet.                           - Continue present medications.                           - Await pathology results. Anticipate repeat                            colonoscopy in 5 years Morgan [name]. Morgan Millar, MD 03/07/2022 10:05:19 AM This report has been signed electronically.

## 2022-03-07 NOTE — Progress Notes (Signed)
Called to room to assist during endoscopic procedure.  Patient ID and intended procedure confirmed with present staff. Received instructions for my participation in the procedure from the performing physician.  

## 2022-03-07 NOTE — Progress Notes (Signed)
Report to PACU, RN, vss, BBS= Clear.  

## 2022-03-07 NOTE — Progress Notes (Signed)
Pt's states no medical or surgical changes since previsit or office visit. 

## 2022-03-07 NOTE — Patient Instructions (Signed)
YOU HAD AN ENDOSCOPIC PROCEDURE TODAY AT [facility]:   Refer to the procedure report that was given to you for any specific questions about what was found during the examination.  If the procedure report does not answer your questions, please call your gastroenterologist to clarify.  If you requested that your care partner not be given the details of your procedure findings, then the procedure report has been included in a sealed envelope for you to review at your convenience later.  YOU SHOULD EXPECT: Some feelings of bloating in the abdomen. Passage of more gas than usual.  Walking can help get rid of the air that was put into your GI tract during the procedure and reduce the bloating. If you had a lower endoscopy (such as a colonoscopy or flexible sigmoidoscopy) you may notice spotting of blood in your stool or on the toilet paper. If you underwent a bowel prep for your procedure, you may not have a normal bowel movement for a few days.  Please Note:  You might notice some irritation and congestion in your nose or some drainage.  This is from the oxygen used during your procedure.  There is no need for concern and it should clear up in a day or so.  SYMPTOMS TO REPORT IMMEDIATELY:  Following lower endoscopy (colonoscopy or flexible sigmoidoscopy):  Excessive amounts of blood in the stool  Significant tenderness or worsening of abdominal pains  Swelling of the abdomen that is new, acute  Fever of 100F or higher   For urgent or emergent issues, a gastroenterologist can be reached at any hour by calling [phone]. Do not use MyChart messaging for urgent concerns.    DIET:  We do recommend a small meal at first, but then you may proceed to your regular diet.  Drink plenty of fluids but you should avoid alcoholic beverages for 24 hours.  MEDICATIONS: Continue present medications.  Please see handouts given to you by your recovery nurse.  Thank you for allowing [place] to  provide for your healthcare needs today.  ACTIVITY:  You should plan to take it easy for the rest of today and you should NOT DRIVE or use heavy machinery until tomorrow (because of the sedation medicines used during the test).    FOLLOW UP: Our staff will call the number listed on your records the next business day following your procedure.  We will call around 7:15- 8:00 am to check on you and address any questions or concerns that you may have regarding the information given to you following your procedure. If we do not reach you, we will leave a message.  If you develop any symptoms (ie: fever, flu-like symptoms, shortness of breath, cough etc.) before then, please call [phone].  If you test positive for Covid 19 in the 2 weeks post procedure, please call and report this information to [place].    If any biopsies were taken you will be contacted by phone or by letter within the next 1-3 weeks.  Please call us at [phone] if you have not heard about the biopsies in 3 weeks.    SIGNATURES/CONFIDENTIALITY: You and/or your care partner have signed paperwork which will be entered into your electronic medical record.  These signatures attest to the fact that that the information above on your After Visit Summary has been reviewed and is understood.  Full responsibility of the confidentiality of this discharge information lies with you and/or your care-partner.

## 2022-03-10 ENCOUNTER — Telehealth: Payer: Self-pay

## 2022-03-10 DIAGNOSIS — M9902 Segmental and somatic dysfunction of thoracic region: Secondary | ICD-10-CM | POA: Diagnosis not present

## 2022-03-10 DIAGNOSIS — M9903 Segmental and somatic dysfunction of lumbar region: Secondary | ICD-10-CM | POA: Diagnosis not present

## 2022-03-10 DIAGNOSIS — M9901 Segmental and somatic dysfunction of cervical region: Secondary | ICD-10-CM | POA: Diagnosis not present

## 2022-03-10 DIAGNOSIS — M9905 Segmental and somatic dysfunction of pelvic region: Secondary | ICD-10-CM | POA: Diagnosis not present

## 2022-03-10 NOTE — Telephone Encounter (Signed)
Left message on answering machine. 

## 2022-03-11 ENCOUNTER — Encounter: Payer: Self-pay | Admitting: Gastroenterology

## 2022-03-12 DIAGNOSIS — M9905 Segmental and somatic dysfunction of pelvic region: Secondary | ICD-10-CM | POA: Diagnosis not present

## 2022-03-12 DIAGNOSIS — M9903 Segmental and somatic dysfunction of lumbar region: Secondary | ICD-10-CM | POA: Diagnosis not present

## 2022-03-12 DIAGNOSIS — M9901 Segmental and somatic dysfunction of cervical region: Secondary | ICD-10-CM | POA: Diagnosis not present

## 2022-03-12 DIAGNOSIS — M9902 Segmental and somatic dysfunction of thoracic region: Secondary | ICD-10-CM | POA: Diagnosis not present

## 2022-03-18 ENCOUNTER — Encounter: Payer: Self-pay | Admitting: Family Medicine

## 2022-03-19 NOTE — Telephone Encounter (Signed)
Lets observe for now. If it is still there in a week, make an OV for [place] to examine her

## 2022-03-21 ENCOUNTER — Encounter: Payer: Self-pay | Admitting: Neurology

## 2022-03-21 ENCOUNTER — Ambulatory Visit (INDEPENDENT_AMBULATORY_CARE_PROVIDER_SITE_OTHER): Payer: BC Managed Care – PPO | Admitting: Neurology

## 2022-03-21 VITALS — BP 137/81 | HR 81 | Ht 64.0 in | Wt 219.0 lb

## 2022-03-21 DIAGNOSIS — H02402 Unspecified ptosis of left eyelid: Secondary | ICD-10-CM

## 2022-03-21 DIAGNOSIS — M79605 Pain in left leg: Secondary | ICD-10-CM | POA: Diagnosis not present

## 2022-03-21 DIAGNOSIS — R29810 Facial weakness: Secondary | ICD-10-CM

## 2022-03-21 DIAGNOSIS — R202 Paresthesia of skin: Secondary | ICD-10-CM

## 2022-03-21 NOTE — Addendum Note (Signed)
Addended by: Alda Berthold on: 03/21/2022 04:40 PM   Modules accepted: Level of Service

## 2022-03-21 NOTE — Patient Instructions (Signed)
Start vitamin B12 107mg daily  MRI brain   Nerve testing of the legs  ELECTROMYOGRAM AND NERVE CONDUCTION STUDIES (EMG/NCS) INSTRUCTIONS  How to Prepare The neurologist conducting the EMG will need to know if you have certain medical conditions. Tell the neurologist and other EMG lab personnel if you: Have a pacemaker or any other electrical medical device Take blood-thinning medications Have hemophilia, a blood-clotting disorder that causes prolonged bleeding Bathing Take a shower or bath shortly before your exam in order to remove oils from your skin. Don't apply lotions or creams before the exam.  What to Expect You'll likely be asked to change into a hospital gown for the procedure and lie down on an examination table. The following explanations can help you understand what will happen during the exam.  Electrodes. The neurologist or a technician places surface electrodes at various locations on your skin depending on where you're experiencing symptoms. Or the neurologist may insert needle electrodes at different sites depending on your symptoms.  Sensations. The electrodes will at times transmit a tiny electrical current that you may feel as a twinge or spasm. The needle electrode may cause discomfort or pain that usually ends shortly after the needle is removed. If you are concerned about discomfort or pain, you may want to talk to the neurologist about taking a short break during the exam.  Instructions. During the needle EMG, the neurologist will assess whether there is any spontaneous electrical activity when the muscle is at rest - activity that isn't present in healthy muscle tissue - and the degree of activity when you slightly contract the muscle.  He or she will give you instructions on resting and contracting a muscle at appropriate times. Depending on what muscles and nerves the neurologist is examining, he or she may ask you to change positions during the exam.  After your  EMG You may experience some temporary, minor bruising where the needle electrode was inserted into your muscle. This bruising should fade within several days. If it persists, contact your primary care doctor.

## 2022-03-21 NOTE — Progress Notes (Signed)
[facility] Clinic Note - Initial Visit   Date: 03/21/2022   Milta Croson MRN: [id number] DOB: March 17, 1966   Dear Dr. Sarajane Jews:  Thank you for your kind referral of Chavon Lucarelli for consultation of falls. Although her history is well known to you, please allow [place] to reiterate it for the purpose of our medical record. The patient was accompanied to the clinic by self.    Anvi Mangal is a 56 y.o. right-handed female with diabetes mellitus, vitamin B12 deficiency, hyperlipidemia, hypertension, and GERD presenting for evaluation of falls.   IMPRESSION/PLAN: Falls, unclear etiology.  No evidence of neuropathy, lumbosacral radiculopathy, or myopathy on her exam.    - NCS/EMG of the legs to better characterize if there is a primary neuromuscular etiology for her falls   - If EDX is normal, she may need to be evaluated for musculoskeletal disorder, such as hip flexor tendonitis as she has localized hip/upper thigh pain, although I'm not sure if it explains her falls.   2.   Left ptosis and facial asymmetry.  No history of Bell's palsy or stroke.  - Check MRI brain wo contrast  Further recommendations pending results.   ------------------------------------------------------------- History of present illness: Since April 2023, she had four falls, two which occurred at church and three at work.  She is unable to determine what causes her to fall.  She denies imbalance, weakness, pain, numbness/tingling, lightheadedness, or LOC. She has no prodromal symptoms. She is able to stand up after her falls.    She has pain from her left upper thigh to her knee, described as achy.  No numbness/tingling.  She has chronic low back pain.  She has been going to see a chiropractor who has been working on her left leg.    Her labs shows vitamin B12 deficiency and she was recommended to start supplementation, however, endorses that she has not done this.    She works as a Glass blower/designer in Teacher, adult education.     Out-side paper records, electronic medical record, and images have been reviewed where available and summarized as:  Lab Results  Component Value Date   HGBA1C 8.6 (H) 09/30/2021   Lab Results  Component Value Date   VITAMINB12 148 (L) 09/30/2021   Lab Results  Component Value Date   TSH 1.56 09/30/2021   No results found for: "ESRSEDRATE", "POCTSEDRATE"  Past Medical History:  Diagnosis Date   Anal fissure    saw Dr. Mercy Riding    Anxiety    on meds   Arthritis    on meds   Asthma    uses inhaler if needed   Carpal tunnel syndrome    Depression    on meds   Diabetes mellitus    on meds   GERD (gastroesophageal reflux disease)    on meds   History of ovarian cancer in adulthood    Hyperlipidemia    on meds   Hypertension    on meds   Ovarian cancer [facility] [place]   Skin cancer (melanoma) ([place]) 1997   Sleep apnea    uses CPAP    Tubular adenoma of colon 01/2014    Past Surgical History:  Procedure Laterality Date   ABDOMINAL HYSTERECTOMY  06/30/1984   BSO   BASAL CELL CARCINOMA EXCISION  10/09/2012   lower lip per Dr. Link Snuffer    COLONOSCOPY  03/23/2019   per Dr. Havery Moros, adenomatous polyps, repeat in 3 yrs  GLBS tumor ovary 1986     HEMORRHOID SURGERY     HIP FRACTURE SURGERY Right 2007   MELANOMA EXCISION  07/01/1995   upper chest      Medications:  Outpatient Encounter Medications as of 03/21/2022  Medication Sig   albuterol (VENTOLIN HFA) 108 (90 Base) MCG/ACT inhaler Inhale 2 puffs into the lungs every 6 (six) hours as needed for wheezing or shortness of breath.   ALPRAZolam (XANAX) 1 MG tablet Take 1 tablet (1 mg total) by mouth at bedtime as needed for sleep.   atorvastatin (LIPITOR) 80 MG tablet TAKE 1 TABLET DAILY   glimepiride (AMARYL) 1 MG tablet Take 1 tablet (1 mg total) by mouth daily with breakfast.   ibuprofen (ADVIL) 200 MG tablet Take 600 mg by mouth every 6  (six) hours as needed for mild pain.   losartan-hydrochlorothiazide (HYZAAR) 50-12.5 MG tablet Take 1 tablet by mouth daily.   metFORMIN (GLUCOPHAGE-XR) 500 MG 24 hr tablet Take 1 tablet (500 mg total) by mouth in the morning and at bedtime.   metoprolol tartrate (LOPRESSOR) 50 MG tablet Take 1 tablet (50 mg total) by mouth 2 (two) times daily.   omeprazole (PRILOSEC) 40 MG capsule Take 1 capsule (40 mg total) by mouth 2 (two) times daily. **PLEASE CALL OFFICE TO SCHEDULE FOLLOW UP   POLYETHYLENE GLYCOL 3350 PO Take 1 packet by mouth 2 (two) times daily.   Semaglutide (RYBELSUS) 7 MG TABS Take 1 tablet by mouth daily before breakfast.   venlafaxine XR (EFFEXOR-XR) 150 MG 24 hr capsule Take 1 (one) tablet by mouth daily.   Facility-Administered Encounter Medications as of 03/21/2022  Medication   0.9 %  sodium chloride infusion    Allergies:  Allergies  Allergen Reactions   Lisinopril Cough    Family History: Family History  Problem Relation Age of Onset   Dementia Mother    Asthma Mother    Bladder Cancer Mother 46   Multiple sclerosis Father    Ovarian cancer Sister 11   Diabetes Brother    Diabetes Brother    Diabetes Maternal Grandfather    Colon polyps Paternal Grandmother    Colon cancer Paternal Grandmother    Colon polyps Paternal Grandfather    Arthritis Other    Prostate cancer Other        grandfather per pt but she is unaware of which grandfather   Coronary artery disease Other    Pancreatic cancer Neg Hx    Stomach cancer Neg Hx    Esophageal cancer Neg Hx    Rectal cancer Neg Hx     Social History: Social History   Tobacco Use   Smoking status: Never   Smokeless tobacco: Never  Vaping Use   Vaping Use: Never used  Substance Use Topics   Alcohol use: No    Alcohol/week: 0.0 standard drinks of alcohol   Drug use: No   Social History   Social History Narrative   Originally from [place], [place], & moved to [place] at 56 y.o. She reports she started a new job  in July 2016. 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 No pets currently. No bird, mold, or hot tub exposure.       Right Handed    Lives in a one story home     Vital  Signs:  BP 137/81   Pulse 81   Ht '5\' 4"'$  (1.626 m)   Wt 219 lb (99.3 kg)   LMP 03/22/1986   SpO2 97%   BMI 37.59 kg/m   Neurological Exam: MENTAL STATUS including orientation to time, place, person, recent and remote memory, attention span and concentration, language, and fund of knowledge is normal.  Speech is not dysarthric.  CRANIAL NERVES: II:  No visual field defects.     III-IV-VI: Pupils equal round and reactive to light.  Normal conjugate, extra-ocular eye movements in all directions of gaze.  No nystagmus.  Mild left ptosis.   V:  Normal facial sensation.    VII:  Mild left facial asymmetry with flattening of the nasolabial fold.   VIII:  Normal hearing and vestibular function.   IX-X:  Normal palatal movement.   XI:  Normal shoulder shrug and head rotation.   XII:  Normal tongue strength and range of motion, no deviation or fasciculation.  MOTOR:  No atrophy, fasciculations or abnormal movements.  No pronator drift.   Upper Extremity:  Right  Left  Deltoid  5/5   5/5   Biceps  5/5   5/5   Triceps  5/5   5/5   Wrist extensors  5/5   5/5   Wrist flexors  5/5   5/5   Finger extensors  5/5   5/5   Finger flexors  5/5   5/5   Dorsal interossei  5/5   5/5   Abductor pollicis  5/5   5/5   Tone (Ashworth scale)  0  0   Lower Extremity:  Right  Left  Hip flexors  5/5   5/5   Hip extensors  5/5   5/5   Adductor 5/5  5/5  Abductor 5/5  5/5  Knee flexors  5/5   5/5   Knee extensors  5/5   5/5   Dorsiflexors  5/5   5/5   Plantarflexors  5/5   5/5   Toe extensors  5/5   5/5   Toe flexors  5/5   5/5   Tone (Ashworth scale)  0  0   MSRs:  Right         Left                  brachioradialis 2+  2+  biceps 2+  2+  triceps 2+  2+  patellar 2+  2+  ankle jerk 2+  2+  Hoffman no  no  plantar response down  down   SENSORY:  Normal and symmetric perception of light touch, pinprick, vibration, and proprioception.  Romberg's sign absent.   COORDINATION/GAIT: Normal finger-to- nose-finger and heel-to-shin.  Intact rapid alternating movements bilaterally.  Gait narrow based and stable. Tandem and stressed gait intact.     Thank you for allowing me to participate in patient's care.  If I can answer any additional questions, I would be pleased to do so.    Sincerely,    Hector Venne K. Posey Pronto, DO

## 2022-03-24 ENCOUNTER — Other Ambulatory Visit: Payer: Self-pay | Admitting: Family Medicine

## 2022-03-24 ENCOUNTER — Encounter: Payer: Self-pay | Admitting: Neurology

## 2022-03-24 DIAGNOSIS — Z1231 Encounter for screening mammogram for malignant neoplasm of breast: Secondary | ICD-10-CM

## 2022-03-24 NOTE — Telephone Encounter (Signed)
Please advise 

## 2022-03-25 ENCOUNTER — Encounter: Payer: Self-pay | Admitting: Family Medicine

## 2022-03-25 ENCOUNTER — Ambulatory Visit (INDEPENDENT_AMBULATORY_CARE_PROVIDER_SITE_OTHER): Payer: BC Managed Care – PPO | Admitting: Family Medicine

## 2022-03-25 VITALS — BP 110/70 | HR 81 | Temp 98.3°F | Wt 217.0 lb

## 2022-03-25 DIAGNOSIS — S2001XA Contusion of right breast, initial encounter: Secondary | ICD-10-CM

## 2022-03-25 DIAGNOSIS — F419 Anxiety disorder, unspecified: Secondary | ICD-10-CM | POA: Diagnosis not present

## 2022-03-25 DIAGNOSIS — F32A Depression, unspecified: Secondary | ICD-10-CM

## 2022-03-25 MED ORDER — ZOLPIDEM TARTRATE 10 MG PO TABS: 10.0000 mg | ORAL_TABLET | Freq: Every evening | ORAL | 5 refills | Status: DC | PRN

## 2022-03-25 MED ORDER — VALACYCLOVIR HCL 500 MG PO TABS: 500.0000 mg | ORAL_TABLET | Freq: Two times a day (BID) | ORAL | 2 refills | Status: DC

## 2022-03-25 MED ORDER — DULOXETINE HCL 60 MG PO CPEP: 60.0000 mg | ORAL_CAPSULE | Freq: Every day | ORAL | 3 refills | Status: DC

## 2022-03-25 MED ORDER — VENLAFAXINE HCL ER 150 MG PO CP24: ORAL_CAPSULE | ORAL | 3 refills | Status: DC

## 2022-03-25 NOTE — Progress Notes (Signed)
   Subjective:    Patient ID: Morgan [name], female    DOB: [birth year], 56 y.o.   MRN: [id number]  HPI Here to check a spot on her right breast that appeared about 4 weeks ago. It first it was tender but not now. It seems to be a little smaller now than when it started. The color has changed from red to purple. No recent trauma. Her last mammogram in November 2022 was normal.    Review of Systems  Constitutional: Negative.   Respiratory: Negative.    Cardiovascular: Negative.   Skin:  Positive for color change.       Objective:   Physical Exam Constitutional:      Appearance: Normal appearance.  Cardiovascular:     Rate and Rhythm: Normal rate and regular rhythm.     Pulses: Normal pulses.     Heart sounds: Normal heart sounds.  Pulmonary:     Effort: Pulmonary effort is normal.     Breath sounds: Normal breath sounds.  Skin:    Comments: The spot in question is on the medial side of the right breast. This is macular and slightly tender it has a central area of purplish skin surrounded by a zone of yellowish skin. No masses are felt.   Neurological:     Mental Status: She is alert.           Assessment & Plan:  She was reassured this is just a bruised area that is healing. She can set up her regular screening mammogram as usual.  [name], MD

## 2022-03-25 NOTE — Telephone Encounter (Signed)
She needs to make an OV so I can examine the spot before we can place these orders

## 2022-03-27 ENCOUNTER — Other Ambulatory Visit: Payer: Self-pay

## 2022-03-27 DIAGNOSIS — F419 Anxiety disorder, unspecified: Secondary | ICD-10-CM

## 2022-03-27 MED ORDER — VENLAFAXINE HCL ER 150 MG PO CP24: ORAL_CAPSULE | ORAL | 3 refills | Status: DC

## 2022-03-29 DIAGNOSIS — G4733 Obstructive sleep apnea (adult) (pediatric): Secondary | ICD-10-CM | POA: Diagnosis not present

## 2022-03-31 ENCOUNTER — Other Ambulatory Visit: Payer: Self-pay

## 2022-03-31 MED ORDER — LOSARTAN POTASSIUM-HCTZ 50-12.5 MG PO TABS: 1.0000 | ORAL_TABLET | Freq: Every day | ORAL | 1 refills | Status: DC

## 2022-04-01 ENCOUNTER — Encounter: Payer: Self-pay | Admitting: Family Medicine

## 2022-04-03 DIAGNOSIS — M9902 Segmental and somatic dysfunction of thoracic region: Secondary | ICD-10-CM | POA: Diagnosis not present

## 2022-04-03 DIAGNOSIS — M9903 Segmental and somatic dysfunction of lumbar region: Secondary | ICD-10-CM | POA: Diagnosis not present

## 2022-04-03 DIAGNOSIS — M9905 Segmental and somatic dysfunction of pelvic region: Secondary | ICD-10-CM | POA: Diagnosis not present

## 2022-04-03 DIAGNOSIS — M9901 Segmental and somatic dysfunction of cervical region: Secondary | ICD-10-CM | POA: Diagnosis not present

## 2022-04-04 ENCOUNTER — Other Ambulatory Visit: Payer: BC Managed Care – PPO

## 2022-04-07 ENCOUNTER — Other Ambulatory Visit: Payer: BC Managed Care – PPO

## 2022-04-08 ENCOUNTER — Ambulatory Visit (INDEPENDENT_AMBULATORY_CARE_PROVIDER_SITE_OTHER): Payer: BC Managed Care – PPO | Admitting: Internal Medicine

## 2022-04-08 ENCOUNTER — Encounter: Payer: Self-pay | Admitting: Internal Medicine

## 2022-04-08 VITALS — BP 110/68 | HR 79 | Ht 64.0 in | Wt 220.0 lb

## 2022-04-08 DIAGNOSIS — E785 Hyperlipidemia, unspecified: Secondary | ICD-10-CM

## 2022-04-08 DIAGNOSIS — E1142 Type 2 diabetes mellitus with diabetic polyneuropathy: Secondary | ICD-10-CM

## 2022-04-08 LAB — POCT GLYCOSYLATED HEMOGLOBIN (HGB A1C): Hemoglobin A1C: 6.5 % — AB (ref 4.0–5.6)

## 2022-04-08 MED ORDER — TRUE METRIX BLOOD GLUCOSE TEST VI STRP: 1.0000 | ORAL_STRIP | Freq: Every day | 3 refills | Status: DC

## 2022-04-08 MED ORDER — METFORMIN HCL ER 500 MG PO TB24: 500.0000 mg | ORAL_TABLET | Freq: Two times a day (BID) | ORAL | 3 refills | Status: DC

## 2022-04-08 MED ORDER — RYBELSUS 14 MG PO TABS: 14.0000 mg | ORAL_TABLET | Freq: Every day | ORAL | 3 refills | Status: DC

## 2022-04-08 MED ORDER — GLIMEPIRIDE 1 MG PO TABS: 1.0000 mg | ORAL_TABLET | Freq: Every day | ORAL | 3 refills | Status: DC

## 2022-04-08 NOTE — Progress Notes (Signed)
Name: Morgan [name]  Age/ Sex: 56 y.o., female   MRN/ DOB: [id number], 1966-03-05     PCP: Laurey Morale, MD   Reason for Endocrinology Evaluation: Type 2 Diabetes Mellitus  Initial Endocrine Consultative Visit: 02/16/2019    PATIENT IDENTIFIER: Morgan [name] is a 56 y.o. female with a past medical history of HTN, OSA,T2DM,and obesity, Ovarian cancer(S/P surgery )  and melanoma. The patient has followed with Endocrinology clinic since 02/16/2019 for consultative assistance with management of her diabetes.  DIABETIC HISTORY:  Morgan [name] was diagnosed with T2DM in 2000. She has been on Metformin, Glipizide and Actos since her diagnosis. Her hemoglobin A1c has ranged from 6.8% in 2018, peaking at 8.3% in 2020.  Rybelsus added in 01/2019 Pioglitazone stopped 05/2019  We attempted to discontinue glipizide in November 2022 due to hypoglycemia and an A1c of 6.1%, but this had to be restarted in May 2023 with an A1c 8.6%   Normal 24-hr urinary cortisol 08/2020  SUBJECTIVE:   During the last visit (11/07/2021): A1c 6.1 %.  We are continued Metformin , Rybelsus, and stopped glipizide due to hypoglycemia   Today (04/08/2022): Morgan [name] is here for a  follow up on diabetes management.   She checks her blood sugars daily . The patient has not had hypoglycemic episodes since the last clinic visit. This happens in the morning .   She was seen by podiatry 02/20/2022 She had a f/u with pulmo 02/18/2022 for OSA  She was also seen by neurology for facial asymmetry  She is seeing a clinical therapist through her job , she continues with depression and anxiety , as well as middle of the night eating    HOME DIABETES REGIMEN:  Glimepiride 1 mg daily  Metformin 500 mg XR  ,1  tablet twice a day  Rybelsus 7 mg daily    METER DOWNLOAD SUMMARY: unable to download 90 day average 132   99- 171 mg/dL    DIABETIC COMPLICATIONS: Microvascular complications:    Denies:  retinopathy, CKD, neuropathy  Last eye exam: scheduled 10/2021   Macrovascular complications:    Denies: CAD, PVD, CVA   HISTORY:  Past Medical History:  Past Medical History:  Diagnosis Date   Anal fissure    saw Dr. Mercy Riding    Anxiety    on meds   Arthritis    on meds   Asthma    uses inhaler if needed   Carpal tunnel syndrome    Depression    on meds   Diabetes mellitus    on meds   GERD (gastroesophageal reflux disease)    on meds   History of ovarian cancer in adulthood    Hyperlipidemia    on meds   Hypertension    on meds   Ovarian cancer [facility] [place]   Skin cancer (melanoma) ([place]) 1997   Sleep apnea    uses CPAP    Tubular adenoma of colon 01/2014   Past Surgical History:  Past Surgical History:  Procedure Laterality Date   ABDOMINAL HYSTERECTOMY  06/30/1984   BSO   BASAL CELL CARCINOMA EXCISION  10/09/2012   lower lip per Dr. Link Snuffer    COLONOSCOPY  03/23/2019   per Dr. Havery Moros, adenomatous polyps, repeat in 3 yrs   GLBS tumor ovary 1986     [place] Right 2007   MELANOMA EXCISION  07/01/1995  upper chest    Social History:  reports that she has never smoked. She has never used smokeless tobacco. She reports that she does not drink alcohol and does not use drugs. Family History:  Family History  Problem Relation Age of Onset   Dementia Mother    Asthma Mother    Bladder Cancer Mother 16   Multiple sclerosis Father    Ovarian cancer Sister 47   Diabetes Brother    Diabetes Brother    Diabetes Maternal Grandfather    Colon polyps Paternal Grandmother    Colon cancer Paternal Grandmother    Colon polyps Paternal Grandfather    Arthritis Other    Prostate cancer Other        grandfather per pt but she is unaware of which grandfather   Coronary artery disease Other    Pancreatic cancer Neg Hx    Stomach cancer Neg Hx    Esophageal cancer Neg Hx    Rectal cancer Neg Hx      HOME  MEDICATIONS: Allergies as of 04/08/2022       Reactions   Lisinopril Cough        Medication List        Accurate as of April 08, 2022  2:52 PM. If you have any questions, ask your nurse or doctor.          albuterol 108 (90 Base) MCG/ACT inhaler Commonly known as: VENTOLIN HFA Inhale 2 puffs into the lungs every 6 (six) hours as needed for wheezing or shortness of breath.   ALPRAZolam 1 MG tablet Commonly known as: XANAX Take 1 tablet (1 mg total) by mouth at bedtime as needed for sleep.   atorvastatin 80 MG tablet Commonly known as: LIPITOR TAKE 1 TABLET DAILY   DULoxetine 60 MG capsule Commonly known as: Cymbalta Take 1 capsule (60 mg total) by mouth daily.   glimepiride 1 MG tablet Commonly known as: AMARYL Take 1 tablet (1 mg total) by mouth daily with breakfast.   ibuprofen 200 MG tablet Commonly known as: ADVIL Take 600 mg by mouth every 6 (six) hours as needed for mild pain.   losartan-hydrochlorothiazide 50-12.5 MG tablet Commonly known as: HYZAAR Take 1 tablet by mouth daily.   metFORMIN 500 MG 24 hr tablet Commonly known as: GLUCOPHAGE-XR Take 1 tablet (500 mg total) by mouth in the morning and at bedtime.   metoprolol tartrate 50 MG tablet Commonly known as: LOPRESSOR Take 1 tablet (50 mg total) by mouth 2 (two) times daily.   omeprazole 40 MG capsule Commonly known as: PRILOSEC Take 1 capsule (40 mg total) by mouth 2 (two) times daily. **PLEASE CALL OFFICE TO SCHEDULE FOLLOW UP   POLYETHYLENE GLYCOL 3350 PO Take 1 packet by mouth 2 (two) times daily.   Rybelsus 7 MG Tabs Generic drug: Semaglutide Take 1 tablet by mouth daily before breakfast.   valACYclovir 500 MG tablet Commonly known as: Valtrex Take 1 tablet (500 mg total) by mouth 2 (two) times daily.   venlafaxine XR 150 MG 24 hr capsule Commonly known as: EFFEXOR-XR Take 1 (one) tablet by mouth daily.   zolpidem 10 MG tablet Commonly known as: AMBIEN Take 1 tablet (10 mg  total) by mouth at bedtime as needed for sleep.         OBJECTIVE:   Vital Signs: BP 110/68 (BP Location: Left Arm, Patient Position: Sitting, Cuff Size: Large)   Pulse 79   Ht '5\' 4"'$  (1.626 m)   Wt  220 lb (99.8 kg)   LMP 03/22/1986   SpO2 97%   BMI 37.76 kg/m   Wt Readings from Last 3 Encounters:  04/08/22 220 lb (99.8 kg)  03/25/22 217 lb (98.4 kg)  03/21/22 219 lb (99.3 kg)     Exam: General: Pt appears well and is in NAD  Lungs: Clear with good BS bilat with no rales, rhonchi, or wheezes  Heart: RRR   Abdomen: Normoactive bowel sounds, soft, nontender, without masses or organomegaly palpable  Extremities: No pretibial edema.  Neuro: MS is good with appropriate affect, pt is alert and Ox3      DM Foot exam 11/07/2021 The skin of the feet is intact without sores or ulcerations. The pedal pulses are 2+ on right and 2+ on left. The sensation is absent on the right  to a screening 5.07, 10 gram monofilament and decreased on the left    DATA REVIEWED:  Lab Results  Component Value Date   HGBA1C 8.6 (H) 09/30/2021   HGBA1C 6.1 (A) 05/10/2021   HGBA1C 5.9 (A) 09/04/2020     Latest Reference Range & Units 09/30/21 16:33  Total CHOL/HDL Ratio  4  Cholesterol 0 - 200 mg/dL 164  HDL Cholesterol >39.00 mg/dL 42.60  Direct LDL mg/dL 77.0  NonHDL  121.35  Triglycerides 0.0 - 149.0 mg/dL 304.0 (H)  VLDL 0.0 - 40.0 mg/dL 60.8 (H)    Latest Reference Range & Units 09/30/21 16:33  Sodium 135 - 145 mEq/L 141  Potassium 3.5 - 5.1 mEq/L 3.5  Chloride 96 - 112 mEq/L 99  CO2 19 - 32 mEq/L 32  Glucose 70 - 99 mg/dL 141 (H)  BUN 6 - 23 mg/dL 20  Creatinine 0.40 - 1.20 mg/dL 0.88  Calcium 8.4 - 10.5 mg/dL 9.9  Alkaline Phosphatase 39 - 117 U/L 54  Albumin 3.5 - 5.2 g/dL 4.5  AST 0 - 37 U/L 17  ALT 0 - 35 U/L 16  Total Protein 6.0 - 8.3 g/dL 7.3  Bilirubin, Direct 0.0 - 0.3 mg/dL 0.1  Total Bilirubin 0.2 - 1.2 mg/dL 0.5  GFR >60.00 mL/min 72.67      ASSESSMENT /  PLAN / RECOMMENDATIONS:   1) Type 2 Diabetes Mellitus, Optimally Controlled, With Neuropathic  complications - Most recent A1c of 6.5 %. Goal A1c < 7.0 %.   -I have praised the patient on improved glycemic control, A1c down from 8.6% to 6.5% -She is intolerant to higher doses of metformin -Patient interested in more weight loss, will increase Rybelsus since she is tolerating it well  MEDICATIONS:  -Continue glimepiride 1 mg daily  -Continue metformin 500 mg XR, 1 tablet BID   - Increase  Rybelsus 14 mg daily    EDUCATION / INSTRUCTIONS: BG monitoring instructions: Patient is instructed to check her blood sugars 3 times a week, fasting. Call [facility] if: BG persistently < 70  I reviewed the Rule of 15 for the treatment of hypoglycemia in detail with the patient. Literature supplied.    2) Diabetic complications:  Eye: Does not  have known diabetic retinopathy.  Neuro/ Feet: Does not have known diabetic peripheral neuropathy. Renal: Patient does not have known baseline CKD. Normal MA/Cr ratio      3) Dyslipidemia:   - LDL at goal but Tg is elevated 308 mg/dL  -Patient has opted to work on lifestyle changes rather than adding fenofibrate in the past   Continue lipitor 80 mg daily      F/U  in 6 months    Signed electronically by: Mack Guise, MD  [facility]  [facility] [address] [place], [place] [address] Phone: [phone] FAX: [phone]   CC: Laurey Morale, [place] [address] Phone: [phone]  Fax: [phone]  Return to Endocrinology clinic as below: Future Appointments  Date Time Provider [place]  04/08/2022  3:00 PM Roverto Bodmer, Melanie Crazier, MD [id number] None  04/29/2022  3:00 PM Narda Amber K, DO [facility] None

## 2022-04-08 NOTE — Patient Instructions (Addendum)
-   Continue Glimepiride 1 mg , 1 tablet daily - Continue Metformin 500 mg 1 tablet twice daily - Increase Rybelsus 14 mg daily    HOW TO TREAT LOW BLOOD SUGARS (Blood sugar LESS THAN 70 MG/DL) Please follow the RULE OF 15 for the treatment of hypoglycemia treatment (when your (blood sugars are less than 70 mg/dL)   STEP 1: Take 15 grams of carbohydrates when your blood sugar is low, which includes:  3-4 GLUCOSE TABS  OR 3-4 OZ OF JUICE OR REGULAR SODA OR ONE TUBE OF GLUCOSE GEL    STEP 2: RECHECK blood sugar in 15 MINUTES STEP 3: If your blood sugar is still low at the 15 minute recheck --> then, go back to STEP 1 and treat AGAIN with another 15 grams of carbohydrates.

## 2022-04-10 NOTE — Telephone Encounter (Signed)
Pt called back in and left a message with the AN wanting to follow up about her MRI

## 2022-04-11 ENCOUNTER — Telehealth: Payer: Self-pay

## 2022-04-11 NOTE — Telephone Encounter (Signed)
Mri was sent to [facility] and she has not heard from them I will call on  Monday.

## 2022-04-14 ENCOUNTER — Telehealth: Payer: Self-pay

## 2022-04-14 NOTE — Telephone Encounter (Signed)
MRI resent to [facility],

## 2022-04-16 ENCOUNTER — Encounter: Payer: Self-pay | Admitting: Family Medicine

## 2022-04-17 ENCOUNTER — Ambulatory Visit
Admission: RE | Admit: 2022-04-17 | Discharge: 2022-04-17 | Disposition: A | Discharge status: Home / Self Care | Payer: BC Managed Care – PPO | Source: Ambulatory Visit | Attending: Neurology | Admitting: Neurology

## 2022-04-17 DIAGNOSIS — H02402 Unspecified ptosis of left eyelid: Secondary | ICD-10-CM

## 2022-04-17 DIAGNOSIS — R29810 Facial weakness: Secondary | ICD-10-CM

## 2022-04-17 DIAGNOSIS — M79605 Pain in left leg: Secondary | ICD-10-CM

## 2022-04-21 ENCOUNTER — Encounter: Payer: Self-pay | Admitting: Neurology

## 2022-04-21 ENCOUNTER — Telehealth: Payer: Self-pay | Admitting: Neurology

## 2022-04-21 NOTE — Telephone Encounter (Signed)
Called and left voice mail to call office.

## 2022-04-21 NOTE — Telephone Encounter (Signed)
See result note. Patient is calling back to receive MRI results of brain.

## 2022-04-21 NOTE — Telephone Encounter (Signed)
Patient called and left a message returning a call.  I returned her call and left a VM to call back with more information.

## 2022-04-24 DIAGNOSIS — M9902 Segmental and somatic dysfunction of thoracic region: Secondary | ICD-10-CM | POA: Diagnosis not present

## 2022-04-24 DIAGNOSIS — M9905 Segmental and somatic dysfunction of pelvic region: Secondary | ICD-10-CM | POA: Diagnosis not present

## 2022-04-24 DIAGNOSIS — M9903 Segmental and somatic dysfunction of lumbar region: Secondary | ICD-10-CM | POA: Diagnosis not present

## 2022-04-24 DIAGNOSIS — M9901 Segmental and somatic dysfunction of cervical region: Secondary | ICD-10-CM | POA: Diagnosis not present

## 2022-04-27 ENCOUNTER — Encounter: Payer: Self-pay | Admitting: Internal Medicine

## 2022-04-29 ENCOUNTER — Encounter: Payer: Self-pay | Admitting: Internal Medicine

## 2022-04-29 ENCOUNTER — Ambulatory Visit (INDEPENDENT_AMBULATORY_CARE_PROVIDER_SITE_OTHER): Payer: BC Managed Care – PPO | Admitting: Neurology

## 2022-04-29 DIAGNOSIS — M79605 Pain in left leg: Secondary | ICD-10-CM

## 2022-04-29 DIAGNOSIS — R202 Paresthesia of skin: Secondary | ICD-10-CM

## 2022-04-29 DIAGNOSIS — E1142 Type 2 diabetes mellitus with diabetic polyneuropathy: Secondary | ICD-10-CM

## 2022-04-29 MED ORDER — TRUE METRIX BLOOD GLUCOSE TEST VI STRP: 1.0000 | ORAL_STRIP | Freq: Every day | 3 refills | Status: DC

## 2022-04-30 ENCOUNTER — Encounter: Payer: Self-pay | Admitting: Neurology

## 2022-04-30 DIAGNOSIS — M9903 Segmental and somatic dysfunction of lumbar region: Secondary | ICD-10-CM | POA: Diagnosis not present

## 2022-04-30 DIAGNOSIS — M9902 Segmental and somatic dysfunction of thoracic region: Secondary | ICD-10-CM | POA: Diagnosis not present

## 2022-04-30 DIAGNOSIS — M9901 Segmental and somatic dysfunction of cervical region: Secondary | ICD-10-CM | POA: Diagnosis not present

## 2022-04-30 DIAGNOSIS — M9905 Segmental and somatic dysfunction of pelvic region: Secondary | ICD-10-CM | POA: Diagnosis not present

## 2022-04-30 DIAGNOSIS — F411 Generalized anxiety disorder: Secondary | ICD-10-CM | POA: Diagnosis not present

## 2022-04-30 DIAGNOSIS — F331 Major depressive disorder, recurrent, moderate: Secondary | ICD-10-CM | POA: Diagnosis not present

## 2022-04-30 NOTE — Procedures (Signed)
  [facility]rology  [address]  [place], [place] [address] Tel: [phone] Fax: [phone] Test Date:  04/29/2022  Patient: Morgan [name] DOB: Mar 07, 1966 Physician: Narda Amber, DO  Sex: Female Height: '5\' 4"'$  Ref Phys: Narda Amber, DO  ID#: [id number]   Technician:    History: This is a 56 year old female referred for evaluation of left leg pain and falls.  NCV & EMG Findings: Extensive electrodiagnostic testing of the left lower extremity shows: Left sural and superficial peroneal sensory responses are within normal limits. Left peroneal and tibial motor responses are within normal limits. Left tibial H reflex study is within normal limits. There is no evidence of active or chronic motor axonal loss changes affecting any of the tested muscles.  Motor unit configuration and recruitment pattern is within normal limits.  Impression: This is a normal study of the left lower extremity.  In particular, there is no evidence of a lumbosacral radiculopathy or sensorimotor polyneuropathy.   ___________________________ Narda Amber, DO    Nerve Conduction Studies   Stim Site NR Peak (ms) Norm Peak (ms) O-P Amp (V) Norm O-P Amp  Left Sup Peroneal Anti Sensory (Ant Lat Mall)  32 C  12 cm    3.1 <4.6 8.1 >4  Left Sural Anti Sensory (Lat Mall)  32 C  Calf    3.5 <4.6 6.1 >4     Stim Site NR Onset (ms) Norm Onset (ms) O-P Amp (mV) Norm O-P Amp Site1 Site2 Delta-0 (ms) Dist (cm) Vel (m/s) Norm Vel (m/s)  Left Peroneal Motor (Ext Dig Brev)  32 C  Ankle    3.6 <6.0 3.8 >2.5 B Fib Ankle 7.3 38.0 52 >40  B Fib    10.9  3.4  Poplt B Fib 2.5 10.0 40 >40  Poplt    13.4  3.6         Left Tibial Motor (Abd Hall Brev)  32 C  Ankle    3.4 <6.0 7.8 >4 Knee Ankle 9.8 42.0 43 >40  Knee    13.2  6.2          Electromyography   Side Muscle Ins.Act Fibs Fasc Recrt Amp Dur Poly Activation Comment  Left AntTibialis Nml Nml Nml Nml Nml Nml Nml Nml N/A  Left Gastroc  Nml Nml Nml Nml Nml Nml Nml Nml N/A  Left Flex Dig Long Nml Nml Nml Nml Nml Nml Nml Nml N/A  Left RectFemoris Nml Nml Nml Nml Nml Nml Nml Nml N/A  Left GluteusMed Nml Nml Nml Nml Nml Nml Nml Nml N/A      Waveforms:

## 2022-05-01 ENCOUNTER — Other Ambulatory Visit: Payer: Self-pay

## 2022-05-01 DIAGNOSIS — E1142 Type 2 diabetes mellitus with diabetic polyneuropathy: Secondary | ICD-10-CM

## 2022-05-02 DIAGNOSIS — M9901 Segmental and somatic dysfunction of cervical region: Secondary | ICD-10-CM | POA: Diagnosis not present

## 2022-05-02 DIAGNOSIS — M9902 Segmental and somatic dysfunction of thoracic region: Secondary | ICD-10-CM | POA: Diagnosis not present

## 2022-05-02 DIAGNOSIS — M9905 Segmental and somatic dysfunction of pelvic region: Secondary | ICD-10-CM | POA: Diagnosis not present

## 2022-05-02 DIAGNOSIS — M9903 Segmental and somatic dysfunction of lumbar region: Secondary | ICD-10-CM | POA: Diagnosis not present

## 2022-05-05 ENCOUNTER — Other Ambulatory Visit: Payer: Self-pay | Admitting: Physician Assistant

## 2022-05-05 ENCOUNTER — Ambulatory Visit (INDEPENDENT_AMBULATORY_CARE_PROVIDER_SITE_OTHER): Payer: BC Managed Care – PPO | Admitting: Physician Assistant

## 2022-05-05 ENCOUNTER — Encounter: Payer: Self-pay | Admitting: Physician Assistant

## 2022-05-05 ENCOUNTER — Encounter: Payer: Self-pay | Admitting: Gastroenterology

## 2022-05-05 DIAGNOSIS — G5601 Carpal tunnel syndrome, right upper limb: Secondary | ICD-10-CM

## 2022-05-05 MED ORDER — LIDOCAINE HCL 1 % IJ SOLN
0.5000 mL | INTRAMUSCULAR | Status: AC | PRN
  Administered 2022-05-05: .5 mL

## 2022-05-05 MED ORDER — METHYLPREDNISOLONE ACETATE 40 MG/ML IJ SUSP
20.0000 mg | INTRAMUSCULAR | Status: AC | PRN
  Administered 2022-05-05: 20 mg

## 2022-05-05 NOTE — Progress Notes (Signed)
   Procedure Note  Patient: Morgan [name]             Date of Birth: 05/09/1966           MRN: [id number]             Visit Date: 05/05/2022 HPI: Clatie comes in today requesting a cortisone injection right hand for carpal tunnel syndrome.  She has known severe carpal tunnel syndrome.  Is been recommend she undergo surgery for these.  She states currently that her left hand is doing well.  She has had no new injury to either hand.  Reports her last hemoglobin A1c is 6.5.  Denies any fevers or chills.  Physical exam: Bilateral hands no rashes skin lesions ulcerations.  Sensation grossly intact bilateral hands light touch.  Negative Tinel's at the wrist of the right hand.  Phalen's is negative bilaterally.   Procedures: Visit Diagnoses:  1. Carpal tunnel syndrome, right upper limb     Hand/UE Inj: R carpal tunnel for carpal tunnel syndrome on 05/05/2022 3:47 PM Medications: 0.5 mL lidocaine 1 %; 20 mg methylPREDNISolone acetate 40 MG/ML     Plan: She understands to watch glucose levels closely over the next 24 to 48 hours.  She will follow-up with [place] as needed.  She is currently not interested in surgery for her bilateral carpal tunnel syndrome which is severe.

## 2022-05-06 ENCOUNTER — Other Ambulatory Visit: Payer: Self-pay

## 2022-05-06 MED ORDER — OMEPRAZOLE 40 MG PO CPDR: 40.0000 mg | DELAYED_RELEASE_CAPSULE | Freq: Two times a day (BID) | ORAL | 0 refills | Status: DC

## 2022-05-06 NOTE — Progress Notes (Signed)
90 day script for omeprazole sent to [facility]

## 2022-05-09 DIAGNOSIS — M9901 Segmental and somatic dysfunction of cervical region: Secondary | ICD-10-CM | POA: Diagnosis not present

## 2022-05-09 DIAGNOSIS — M9903 Segmental and somatic dysfunction of lumbar region: Secondary | ICD-10-CM | POA: Diagnosis not present

## 2022-05-09 DIAGNOSIS — M9906 Segmental and somatic dysfunction of lower extremity: Secondary | ICD-10-CM | POA: Diagnosis not present

## 2022-05-09 DIAGNOSIS — M9902 Segmental and somatic dysfunction of thoracic region: Secondary | ICD-10-CM | POA: Diagnosis not present

## 2022-05-09 DIAGNOSIS — M9905 Segmental and somatic dysfunction of pelvic region: Secondary | ICD-10-CM | POA: Diagnosis not present

## 2022-05-15 DIAGNOSIS — F411 Generalized anxiety disorder: Secondary | ICD-10-CM | POA: Diagnosis not present

## 2022-05-15 DIAGNOSIS — F331 Major depressive disorder, recurrent, moderate: Secondary | ICD-10-CM | POA: Diagnosis not present

## 2022-05-19 DIAGNOSIS — F331 Major depressive disorder, recurrent, moderate: Secondary | ICD-10-CM | POA: Diagnosis not present

## 2022-05-19 DIAGNOSIS — F411 Generalized anxiety disorder: Secondary | ICD-10-CM | POA: Diagnosis not present

## 2022-05-24 ENCOUNTER — Encounter: Payer: Self-pay | Admitting: Family Medicine

## 2022-05-25 ENCOUNTER — Encounter: Payer: Self-pay | Admitting: Family Medicine

## 2022-05-25 ENCOUNTER — Encounter: Payer: Self-pay | Admitting: Internal Medicine

## 2022-05-26 ENCOUNTER — Other Ambulatory Visit: Payer: Self-pay

## 2022-05-26 DIAGNOSIS — I10 Essential (primary) hypertension: Secondary | ICD-10-CM

## 2022-05-26 DIAGNOSIS — F411 Generalized anxiety disorder: Secondary | ICD-10-CM | POA: Diagnosis not present

## 2022-05-26 DIAGNOSIS — F331 Major depressive disorder, recurrent, moderate: Secondary | ICD-10-CM | POA: Diagnosis not present

## 2022-05-26 MED ORDER — METOPROLOL TARTRATE 50 MG PO TABS: 50.0000 mg | ORAL_TABLET | Freq: Two times a day (BID) | ORAL | 1 refills | Status: DC

## 2022-05-27 ENCOUNTER — Ambulatory Visit (HOSPITAL_COMMUNITY)
Admission: AD | Admit: 2022-05-27 | Discharge: 2022-05-27 | Disposition: A | Discharge status: Home / Self Care | Payer: BC Managed Care – PPO | Attending: Psychiatry | Admitting: Psychiatry

## 2022-05-27 NOTE — H&P (Signed)
Behavioral Health Medical Screening Exam  HPI: Morgan [name] is a 56 y.o. Caucasian female who presents voluntarily to [facility] for complaint of anger towards the brother, feeling of depression and anxiety.  Patient has past psychiatric diagnosis of depression and anxiety.  Patient reports multiple medical comorbidities. This is patient's first presentation to [facility].  Assessment: Patient seen face-to-face and examined in the screen room.  Appears calm, however with teary eyes.  Chart reviewed and findings shared with the treatment team and consult with Dr. Dwyane Dee.  Alert and oriented x 4, speech clear, coherent and with normal volume and pattern.  Able to maintain good eye contact with the provider.  Presents with angry, anxious irritable and worthless mood.  Affect labile and tearful.  When asked what brought her today to behavioral health Hospital, reports I am very angry at my brother.  We moved my mother to a skilled nursing home, and my brother did not help.  I ended up paying for the truck, packing her stuff and moving her in.  Patient started crying.  Have to do all by myself, and I am tired of it.  Reported her sister died in 05-28-1995, father died in May 27, 2004, and stepmom died in 04/26/20.  Report this time of the year makes her very sad, depressed and angry for the past 2 weeks.  Patient denies SI, HI, AVH.  She further denies psychotic symptoms, suicidal attempts, self injurious behavior, alcohol use, drug use, or tobacco use.  Denies history of trauma or abuse, denies access to firearms.  Endorses being followed by a therapist Charisse March who is very good to her.  Emotional support provided for ongoing stressors.  Disposition: Based on my evaluation of patient, she is not at imminent danger to herself or others.  She does not meet the criteria for inpatient psychiatric admission.  She was discharged to home with further outpatient counseling  services.  Patient departed [facility] without any incidents and was very appreciative for providers attention.  Total Time spent with patient: 30 minutes  Psychiatric Specialty Exam:  Presentation  General Appearance:  Appropriate for Environment; Casual; Fairly Groomed  Eye Contact: Good  Speech: Clear and Coherent; Normal Rate  Speech Volume: Normal  Handedness: Right  Mood and Affect  Mood: Angry; Anxious; Irritable; Worthless  Affect: Congruent; Labile; Tearful (Tearful about brother not being considerate)  Thought Process  Thought Processes: Coherent  Descriptions of Associations:Intact  Orientation:Full (Time, Place and Person)  Thought Content:Logical  History of Schizophrenia/Schizoaffective disorder:No data recorded Duration of Psychotic Symptoms:No data recorded Hallucinations:Hallucinations: None  Ideas of Reference:None  Suicidal Thoughts:Suicidal Thoughts: No  Homicidal Thoughts:Homicidal Thoughts: No  Sensorium  Memory: Immediate Good; Recent Good  Judgment: Good  Insight: Good  Executive Functions  Concentration: Good  Attention Span: Good  Recall: Good  Fund of Knowledge: Good  Language: Good  Psychomotor Activity  Psychomotor Activity: Psychomotor Activity: Normal  Assets  Assets: Communication Skills; Physical Health; Social Support  Sleep  Sleep: Sleep: Good Number of Hours of Sleep: 8  Physical Exam: Physical Exam Vitals and nursing note reviewed.  Constitutional:      Appearance: Normal appearance.  HENT:     Head: Normocephalic.     Right Ear: External ear normal.     Left Ear: External ear normal.     Nose: Nose normal.     Mouth/Throat:     Mouth: Mucous membranes are moist.     Pharynx: Oropharynx  is clear.  Eyes:     Extraocular Movements: Extraocular movements intact.     Conjunctiva/sclera: Conjunctivae normal.     Pupils: Pupils are equal, round, and reactive to light.  Cardiovascular:      Rate and Rhythm: Normal rate.     Pulses: Normal pulses.  Pulmonary:     Effort: Pulmonary effort is normal.  Abdominal:     Palpations: Abdomen is soft.  Genitourinary:    Comments: Deferred Musculoskeletal:        General: Normal range of motion.     Cervical back: Normal range of motion.  Skin:    General: Skin is warm.  Neurological:     General: No focal deficit present.     Mental Status: She is alert and oriented to person, place, and time.  Psychiatric:        Mood and Affect: Mood normal.        Behavior: Behavior normal.        Thought Content: Thought content normal.    Review of Systems  Constitutional: Negative.   HENT: Negative.    Eyes: Negative.   Respiratory: Negative.    Cardiovascular: Negative.   Gastrointestinal: Negative.   Genitourinary: Negative.   Musculoskeletal: Negative.   Skin: Negative.   Neurological: Negative.   Endo/Heme/Allergies: Negative.                Reaction Severity Reaction Type Noted       Adverse Reactions/Drug Intolerances    Lisinopril  Cough Low Intolerance 11/07/2011    Psychiatric/Behavioral: Negative.     Blood pressure 120/77, pulse 81, temperature 98 F (36.7 C), temperature source Oral, resp. rate 16, last menstrual period 03/22/1986, SpO2 100 %. There is no height or weight on file to calculate BMI.  Musculoskeletal: Strength & Muscle Tone: within normal limits Gait & Station: normal Patient leans: N/A  [place] Scale:  Flowsheet Row OP Visit from 05/27/2022 in [facility] No Risk       Recommendations:  Based on my evaluation the patient does not appear to have an emergency medical condition.  Laretta Bolster, FNP 05/27/2022, 7:44 PM

## 2022-05-27 NOTE — Telephone Encounter (Signed)
Please speak to Morgan [name] about this since I just now saw this message. I would like to see her in the office to discuss medication changes, etc. If she is truly this unstable, she should go to the intake desk of [facility] to be evaluated or else the ED

## 2022-05-29 NOTE — Telephone Encounter (Signed)
Attempted several times to reach pt, left a message advising to contact our office or schedule appointment with Dr Sarajane Jews, send pt message via [place]

## 2022-05-30 NOTE — Telephone Encounter (Signed)
I am glad to hear this

## 2022-06-04 ENCOUNTER — Ambulatory Visit: Payer: BC Managed Care – PPO | Admitting: Family Medicine

## 2022-06-04 ENCOUNTER — Encounter: Payer: Self-pay | Admitting: Neurology

## 2022-06-04 DIAGNOSIS — M9902 Segmental and somatic dysfunction of thoracic region: Secondary | ICD-10-CM | POA: Diagnosis not present

## 2022-06-04 DIAGNOSIS — F331 Major depressive disorder, recurrent, moderate: Secondary | ICD-10-CM | POA: Diagnosis not present

## 2022-06-04 DIAGNOSIS — M9905 Segmental and somatic dysfunction of pelvic region: Secondary | ICD-10-CM | POA: Diagnosis not present

## 2022-06-04 DIAGNOSIS — M9906 Segmental and somatic dysfunction of lower extremity: Secondary | ICD-10-CM | POA: Diagnosis not present

## 2022-06-04 DIAGNOSIS — M9901 Segmental and somatic dysfunction of cervical region: Secondary | ICD-10-CM | POA: Diagnosis not present

## 2022-06-04 DIAGNOSIS — F411 Generalized anxiety disorder: Secondary | ICD-10-CM | POA: Diagnosis not present

## 2022-06-04 DIAGNOSIS — M9903 Segmental and somatic dysfunction of lumbar region: Secondary | ICD-10-CM | POA: Diagnosis not present

## 2022-06-05 DIAGNOSIS — F331 Major depressive disorder, recurrent, moderate: Secondary | ICD-10-CM | POA: Diagnosis not present

## 2022-06-05 DIAGNOSIS — F411 Generalized anxiety disorder: Secondary | ICD-10-CM | POA: Diagnosis not present

## 2022-06-11 ENCOUNTER — Ambulatory Visit (INDEPENDENT_AMBULATORY_CARE_PROVIDER_SITE_OTHER): Payer: BC Managed Care – PPO | Admitting: Family Medicine

## 2022-06-11 ENCOUNTER — Encounter: Payer: Self-pay | Admitting: Family Medicine

## 2022-06-11 VITALS — BP 110/78 | HR 82 | Temp 98.0°F | Wt 217.0 lb

## 2022-06-11 DIAGNOSIS — F32A Depression, unspecified: Secondary | ICD-10-CM | POA: Diagnosis not present

## 2022-06-11 DIAGNOSIS — F419 Anxiety disorder, unspecified: Secondary | ICD-10-CM | POA: Diagnosis not present

## 2022-06-11 DIAGNOSIS — G4733 Obstructive sleep apnea (adult) (pediatric): Secondary | ICD-10-CM | POA: Diagnosis not present

## 2022-06-11 MED ORDER — LAMOTRIGINE 25 MG PO TABS: 25.0000 mg | ORAL_TABLET | Freq: Every day | ORAL | 0 refills | Status: DC

## 2022-06-11 NOTE — Progress Notes (Signed)
o  Subjective:    Patient ID: Morgan [name], female    DOB: 1966/06/04, 56 y.o.   MRN: [id number]  HPI Here for help with her anxiety. She has been taking Venlafaxine XR 150 mg daily for the past year or so, and it was working well until a few weeks ago. She also uses a single Xanax ar bedtime, and she sleeps well with this.  Her mother had been living in an assisted living facility here in [place] for several years, and this allowed [place] to keep a close watch on her and to visit her frequently. Setareh would speak to the staff whenever she thought anything was wrong with the car given. Then her mother had to go on Medicaid, and she had to be moved to a nursing home in [place], [place]. Maliah's sister lives there so she can keep an eye on her mother. Since the move Akeisha feels a lack of control over the situation, and this makes her very anxious and often angry. She has vented her anger on her sister, her brother, and the nursing home staff. She has been meeting virtually with her therapist every 2 weeks, and they have been talking about this situation.    Review of Systems  Constitutional: Negative.   Respiratory: Negative.    Cardiovascular: Negative.   Psychiatric/Behavioral:  Positive for agitation, decreased concentration and dysphoric mood. Negative for behavioral problems, confusion, hallucinations, self-injury, sleep disturbance and suicidal ideas. The patient is nervous/anxious.        Objective:   Physical Exam Constitutional:      Appearance: Normal appearance.  Cardiovascular:     Rate and Rhythm: Normal rate.     Pulses: Normal pulses.     Heart sounds: Normal heart sounds.  Pulmonary:     Effort: Pulmonary effort is normal.     Breath sounds: Normal breath sounds.  Neurological:     Mental Status: She is alert.  Psychiatric:        Behavior: Behavior normal.        Thought Content: Thought content normal.     Comments: She is anxious             Assessment & Plan:  Anxiety and depression. This has been worsened by her mother's relocation. I suggested she begin seeing her therapist weekly at this point. We will also add Lamictal 25 mg daily at bedtime. She will report back to [place] in 2-3-weeks. I anticipate that she will need a higher dose of this.  [name], MD

## 2022-06-13 DIAGNOSIS — M9905 Segmental and somatic dysfunction of pelvic region: Secondary | ICD-10-CM | POA: Diagnosis not present

## 2022-06-13 DIAGNOSIS — M9906 Segmental and somatic dysfunction of lower extremity: Secondary | ICD-10-CM | POA: Diagnosis not present

## 2022-06-13 DIAGNOSIS — M9902 Segmental and somatic dysfunction of thoracic region: Secondary | ICD-10-CM | POA: Diagnosis not present

## 2022-06-13 DIAGNOSIS — M9901 Segmental and somatic dysfunction of cervical region: Secondary | ICD-10-CM | POA: Diagnosis not present

## 2022-06-13 DIAGNOSIS — M9903 Segmental and somatic dysfunction of lumbar region: Secondary | ICD-10-CM | POA: Diagnosis not present

## 2022-06-16 ENCOUNTER — Other Ambulatory Visit: Payer: Self-pay

## 2022-06-16 DIAGNOSIS — E785 Hyperlipidemia, unspecified: Secondary | ICD-10-CM

## 2022-06-16 MED ORDER — ATORVASTATIN CALCIUM 80 MG PO TABS: 80.0000 mg | ORAL_TABLET | Freq: Every day | ORAL | 1 refills | Status: DC

## 2022-06-18 DIAGNOSIS — F321 Major depressive disorder, single episode, moderate: Secondary | ICD-10-CM | POA: Diagnosis not present

## 2022-06-20 ENCOUNTER — Ambulatory Visit: Payer: BC Managed Care – PPO | Admitting: Physician Assistant

## 2022-06-23 ENCOUNTER — Encounter: Payer: Self-pay | Admitting: Family Medicine

## 2022-06-24 MED ORDER — LAMOTRIGINE 25 MG PO TABS: 25.0000 mg | ORAL_TABLET | Freq: Every day | ORAL | 1 refills | Status: DC

## 2022-06-24 NOTE — Telephone Encounter (Signed)
I sent in the refills  

## 2022-07-03 ENCOUNTER — Encounter: Payer: Self-pay | Admitting: Family Medicine

## 2022-07-04 NOTE — Telephone Encounter (Signed)
For these symptoms I recommend she take an antihistamine like Zyrtec or Claritin

## 2022-07-07 ENCOUNTER — Encounter: Payer: Self-pay | Admitting: Family Medicine

## 2022-07-07 ENCOUNTER — Telehealth (INDEPENDENT_AMBULATORY_CARE_PROVIDER_SITE_OTHER): Payer: BC Managed Care – PPO | Admitting: Family Medicine

## 2022-07-07 VITALS — Ht 64.0 in | Wt 217.0 lb

## 2022-07-07 DIAGNOSIS — E119 Type 2 diabetes mellitus without complications: Secondary | ICD-10-CM | POA: Diagnosis not present

## 2022-07-07 DIAGNOSIS — U071 COVID-19: Secondary | ICD-10-CM

## 2022-07-07 NOTE — Progress Notes (Signed)
Virtual Visit via Video Note  Subjective  CC:  Chief Complaint  Patient presents with   Covid Positive    Pt stated that she tested positive for COVID on 07/03/2022. Pt stated that she feels a little bit better just some congestions stuffy nose.      I connected with [name] on 07/07/22 at  9:00 AM EST by a video enabled telemedicine application and verified that I am speaking with the correct person using two identifiers. Location patient: Home Location provider: [facility] [place], Office Persons participating in the virtual visit: Morgan [name], Done, MD Darlina Rumpf CMA  Same day acute visit; PCP not available. New pt to me. Chart reviewed.   I discussed the limitations of evaluation and management by telemedicine and the availability of in person appointments. The patient expressed understanding and agreed to proceed. HPI: Morgan [name] is a 57 y.o. female who was contacted today to address the problems listed above in the chief complaint. 57 yo with diabetesand hyperlipidemia on statin  w/ covid, day #5: doing well overall. Congestion is worse sx. On coricidin D and stable. No fevers or sob. Mild cough. Was told to test tomorrow night again and if negative can return to work. No cp. No GI sxs. Sugars are stable clinically  Assessment  1. COVID-19 virus infection   2. Diabetes mellitus without complication ([place])      Plan  Covid infection:  doing well; clinically stable. Continue current management. Discussed possibility of testing positive for a duration of time but should be able to return to work with mask if feels able. She will check with her work protocol. Do not recommend other medications/antiviral at this time. Monitor sugars. hydrate  I discussed the assessment and treatment plan with the patient. The patient was provided an opportunity to ask questions and all were answered. The patient agreed  with the plan and demonstrated an understanding of the instructions.   The patient was advised to call back or seek an in-person evaluation if the symptoms worsen or if the condition fails to improve as anticipated. Follow up: Return if symptoms worsen or fail to improve.  Visit date not found  No orders of the defined types were placed in this encounter.     I reviewed the patients updated PMH, FH, and SocHx.    Patient Active Problem List   Diagnosis Date Noted   Carpal tunnel syndrome, left upper limb 08/26/2021   Carpal tunnel syndrome, right upper limb 08/26/2021   Carpal tunnel syndrome on both sides 08/02/2021   Asthma 01/09/2021   Vitamin B12 deficiency 09/05/2020   Dyslipidemia 09/04/2020   Neuropathy 09/04/2020   Mixed dyslipidemia 09/04/2020   COVID-19 virus infection 06/19/2020   Type 2 diabetes mellitus with diabetic polyneuropathy, without long-term current use of insulin ([place]) 08/25/2019   Type 2 diabetes mellitus with hyperglycemia, without long-term current use of insulin ([place]) 02/17/2019   Anxiety and depression 02/17/2019   BMI 45.0-49.9, adult ([place]) 02/04/2019   Ringing in ears, bilateral 12/29/2017   OSA on CPAP 01/06/2017   Witnessed episode of apnea 07/03/2016   Diverticulitis of colon 11/30/2014   IBS (irritable bowel syndrome) 04/21/2013   Chronic cough 11/26/2011   External hemorrhoids 04/04/2010   UNSPECIFIED THROMBOSED HEMORRHOIDS 01/08/2009   GERD 08/23/2008   Diabetes mellitus without complication ([place]) 40/98/1191   Hyperlipidemia 01/22/2007   Essential hypertension 01/22/2007   Current Meds  Medication Sig   albuterol (VENTOLIN HFA) 108 (90 Base) MCG/ACT inhaler Inhale 2 puffs into the lungs every 6 (six) hours as needed for wheezing or shortness of breath.   ALPRAZolam (XANAX) 1 MG tablet Take 1 tablet (1 mg total) by mouth at bedtime as needed for sleep.   atorvastatin (LIPITOR) 80 MG tablet Take 1 tablet (80 mg total) by mouth daily.    lamoTRIgine (LAMICTAL) 25 MG tablet Take 1 tablet (25 mg total) by mouth daily.   losartan-hydrochlorothiazide (HYZAAR) 50-12.5 MG tablet Take 1 tablet by mouth daily.   metFORMIN (GLUCOPHAGE-XR) 500 MG 24 hr tablet Take 1 tablet (500 mg total) by mouth in the morning and at bedtime.   metoprolol tartrate (LOPRESSOR) 50 MG tablet Take 1 tablet (50 mg total) by mouth 2 (two) times daily.   omeprazole (PRILOSEC) 40 MG capsule Take 1 capsule (40 mg total) by mouth 2 (two) times daily. **PLEASE CALL OFFICE TO SCHEDULE FOLLOW UP. Thank you   Semaglutide (RYBELSUS) 14 MG TABS Take 1 tablet (14 mg total) by mouth daily.   venlafaxine XR (EFFEXOR-XR) 150 MG 24 hr capsule Take 1 (one) tablet by mouth daily.   Current Facility-Administered Medications for the 07/07/22 encounter (Video Visit) with Leamon Arnt, MD  Medication   0.9 %  sodium chloride infusion    Allergies: Patient is allergic to lisinopril. Family History: Patient family history includes Arthritis in an other family member; Asthma in her mother; Bladder Cancer (age of onset: 93) in her mother; Colon cancer in her paternal grandmother; Colon polyps in her paternal grandfather and paternal grandmother; Coronary artery disease in an other family member; Dementia in her mother; Diabetes in her brother, brother, and maternal grandfather; Multiple sclerosis in her father; Ovarian cancer (age of onset: 56) in her sister; Prostate cancer in an other family member. Social History:  Patient  reports that she has never smoked. She has never used smokeless tobacco. She reports that she does not drink alcohol and does not use drugs.  Review of Systems: Constitutional: Negative for fever malaise or anorexia Cardiovascular: negative for chest pain Respiratory: negative for SOB or persistent cough Gastrointestinal: negative for abdominal pain  OBJECTIVE Vitals: Ht '5\' 4"'$  (1.626 m)   Wt 217 lb (98.4 kg)   LMP 03/22/1986   BMI 37.25 kg/m   General: no acute distress , A&Ox3  Leamon Arnt, MD

## 2022-07-09 DIAGNOSIS — F321 Major depressive disorder, single episode, moderate: Secondary | ICD-10-CM | POA: Diagnosis not present

## 2022-07-10 ENCOUNTER — Other Ambulatory Visit: Payer: Self-pay

## 2022-07-10 MED ORDER — LAMOTRIGINE 25 MG PO TABS: 25.0000 mg | ORAL_TABLET | Freq: Every day | ORAL | 1 refills | Status: DC

## 2022-07-15 DIAGNOSIS — M9903 Segmental and somatic dysfunction of lumbar region: Secondary | ICD-10-CM | POA: Diagnosis not present

## 2022-07-15 DIAGNOSIS — M9902 Segmental and somatic dysfunction of thoracic region: Secondary | ICD-10-CM | POA: Diagnosis not present

## 2022-07-15 DIAGNOSIS — M9905 Segmental and somatic dysfunction of pelvic region: Secondary | ICD-10-CM | POA: Diagnosis not present

## 2022-07-15 DIAGNOSIS — M9901 Segmental and somatic dysfunction of cervical region: Secondary | ICD-10-CM | POA: Diagnosis not present

## 2022-07-17 ENCOUNTER — Ambulatory Visit: Payer: BC Managed Care – PPO | Admitting: Psychology

## 2022-07-18 ENCOUNTER — Ambulatory Visit (INDEPENDENT_AMBULATORY_CARE_PROVIDER_SITE_OTHER): Payer: BC Managed Care – PPO | Admitting: Physician Assistant

## 2022-07-18 ENCOUNTER — Encounter: Payer: Self-pay | Admitting: Physician Assistant

## 2022-07-18 VITALS — BP 118/76 | HR 71 | Ht 64.0 in | Wt 214.2 lb

## 2022-07-18 DIAGNOSIS — Z86010 Personal history of colonic polyps: Secondary | ICD-10-CM | POA: Diagnosis not present

## 2022-07-18 DIAGNOSIS — K219 Gastro-esophageal reflux disease without esophagitis: Secondary | ICD-10-CM | POA: Diagnosis not present

## 2022-07-18 MED ORDER — OMEPRAZOLE 40 MG PO CPDR: 40.0000 mg | DELAYED_RELEASE_CAPSULE | Freq: Two times a day (BID) | ORAL | 2 refills | Status: DC

## 2022-07-18 NOTE — Progress Notes (Signed)
Chief Complaint: Refill for Omeprazole  HPI:    Morgan [name] is a 57 year old female with a past medical history as listed below including diabetes, depression, anxiety, ovarian cancer and GERD, known to Dr. Havery Moros, who presents to clinic today for refills of her Omeprazole.     03/07/2022 colonoscopy with three 3-5 mm polyps in transverse colon, diverticulosis in the transverse and left colon and internal hemorrhoids.  Pathology showed adenomatous polyps.  Repeat recommended in 5 years.    Today, the patient presents to clinic and tells me that she is doing well as long she takes her Omeprazole 40 mg twice daily, she tried to decrease this/discontinue it and had a lot of problems for the 2 weeks that she attempted.  She would prefer to stay on it.    Also complains of a small bump near her rectum which is painful, apparently has felt it there for the past couple of years but only recently become painful.    Denies fever, chills, weight loss or blood in her stool.  Past Medical History:  Diagnosis Date   Anal fissure    saw Dr. Mercy Riding    Anxiety    on meds   Arthritis    on meds   Asthma    uses inhaler if needed   Carpal tunnel syndrome    Depression    on meds   Diabetes mellitus    on meds   GERD (gastroesophageal reflux disease)    on meds   History of ovarian cancer in adulthood    Hyperlipidemia    on meds   Hypertension    on meds   Ovarian cancer [facility] [place]   Skin cancer (melanoma) ([place]) 1997   Sleep apnea    uses CPAP    Tubular adenoma of colon 01/2014    Past Surgical History:  Procedure Laterality Date   ABDOMINAL HYSTERECTOMY  06/30/1984   BSO   BASAL CELL CARCINOMA EXCISION  10/09/2012   lower lip per Dr. Link Snuffer    COLONOSCOPY  03/23/2019   per Dr. Havery Moros, adenomatous polyps, repeat in 3 yrs   GLBS tumor ovary 1986     [place] Right 2007   MELANOMA EXCISION  07/01/1995   upper chest      Current Outpatient Medications  Medication Sig Dispense Refill   albuterol (VENTOLIN HFA) 108 (90 Base) MCG/ACT inhaler Inhale 2 puffs into the lungs every 6 (six) hours as needed for wheezing or shortness of breath. 8 g 6   ALPRAZolam (XANAX) 1 MG tablet Take 1 tablet (1 mg total) by mouth at bedtime as needed for sleep. 90 tablet 1   atorvastatin (LIPITOR) 80 MG tablet Take 1 tablet (80 mg total) by mouth daily. 90 tablet 1   lamoTRIgine (LAMICTAL) 25 MG tablet Take 1 tablet (25 mg total) by mouth daily. 90 tablet 1   losartan-hydrochlorothiazide (HYZAAR) 50-12.5 MG tablet Take 1 tablet by mouth daily. 90 tablet 1   metFORMIN (GLUCOPHAGE-XR) 500 MG 24 hr tablet Take 1 tablet (500 mg total) by mouth in the morning and at bedtime. 180 tablet 3   metoprolol tartrate (LOPRESSOR) 50 MG tablet Take 1 tablet (50 mg total) by mouth 2 (two) times daily. 180 tablet 1   omeprazole (PRILOSEC) 40 MG capsule Take 1 capsule (40 mg total) by mouth 2 (two) times daily. **PLEASE CALL OFFICE TO SCHEDULE FOLLOW UP. Thank you 180 capsule  0   Semaglutide (RYBELSUS) 14 MG TABS Take 1 tablet (14 mg total) by mouth daily. 90 tablet 3   venlafaxine XR (EFFEXOR-XR) 150 MG 24 hr capsule Take 1 (one) tablet by mouth daily. 90 capsule 3   Current Facility-Administered Medications  Medication Dose Route Frequency Provider Last Rate Last Admin   0.9 %  sodium chloride infusion  500 mL Intravenous Once Armbruster, [name], MD        Allergies as of 07/18/2022 - Review Complete 07/18/2022  Allergen Reaction Noted   Lisinopril Cough 11/07/2011    Family History  Problem Relation Age of Onset   Dementia Mother    Asthma Mother    Bladder Cancer Mother 72   Multiple sclerosis Father    Ovarian cancer Sister 63   Diabetes Brother    Diabetes Brother    Diabetes Maternal Grandfather    Colon polyps Paternal Grandmother    Colon cancer Paternal Grandmother    Colon polyps Paternal Grandfather    Arthritis Other     Prostate cancer Other        grandfather per pt but she is unaware of which grandfather   Coronary artery disease Other    Pancreatic cancer Neg Hx    Stomach cancer Neg Hx    Esophageal cancer Neg Hx    Rectal cancer Neg Hx     Social History   Socioeconomic History   Marital status: Single    Spouse name: Not on file   Number of children: Not on file   Years of education: Not on file   Highest education level: Not on file  Occupational History   Occupation: billing     Employer: [employer]  Tobacco Use   Smoking status: Never   Smokeless tobacco: Never  Vaping Use   Vaping Use: Never used  Substance and Sexual Activity   Alcohol use: No    Alcohol/week: 0.0 standard drinks of alcohol   Drug use: No   Sexual activity: Not on file  Other Topics Concern   Not on file  Social History Narrative   Originally from [place], [place], & moved to [place] at 57 y.o. She reports she started a new job in July 2016. 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 No pets currently. No bird, mold, or hot tub exposure.       Right Handed    Lives in a one story home    Social Determinants of Health   Financial Resource Strain: Not on file  Food Insecurity: Not on file  Transportation Needs: Not on file  Physical Activity: Not on file  Stress: Not on file  Social Connections: Not on file  Intimate Partner Violence: Not on file    Review of Systems:    Constitutional: No weight loss, fever or chills Cardiovascular: No chest pain Respiratory: No SOB  Gastrointestinal: See HPI and otherwise negative   Physical Exam:  Vital signs: BP 118/76 (BP Location: Left Arm, Patient Position: Sitting, Cuff Size: Normal)   Pulse 71   Ht '5\' 4"'$  (1.626 m)   Wt 214 lb 4 oz (97.2 kg)   LMP 03/22/1986   SpO2 96%   BMI 36.78 kg/m    Constitutional:   Pleasant overweight Caucasian female appears to be in NAD, Well developed, Well  nourished, alert and cooperative Respiratory: Respirations even and unlabored. Lungs clear to auscultation bilaterally.   No wheezes, crackles, or rhonchi.  Cardiovascular: Normal S1, S2. No MRG. Regular rate and rhythm. No peripheral edema, cyanosis or pallor.  Gastrointestinal:  Soft, nondistended, nontender. No rebound or guarding. Normal bowel sounds. No appreciable masses or hepatomegaly. Rectal:  Not performed.  Skin:   Dry and intact. +whitehead near rectum, tender to palpation but no surrounding erythema or fluctuance Psychiatric: Oriented to person, place and time. Demonstrates good judgement and reason without abnormal affect or behaviors.  RELEVANT LABS AND IMAGING: CBC    Component Value Date/Time   WBC 9.1 09/30/2021 1633   RBC 4.51 09/30/2021 1633   HGB 13.4 09/30/2021 1633   HCT 39.5 09/30/2021 1633   PLT 368.0 09/30/2021 1633   MCV 87.5 09/30/2021 1633   MCH 28.7 10/17/2019 1135   MCHC 33.9 09/30/2021 1633   RDW 14.2 09/30/2021 1633   LYMPHSABS 3.5 09/30/2021 1633   MONOABS 0.6 09/30/2021 1633   EOSABS 0.0 09/30/2021 1633   BASOSABS 0.1 09/30/2021 1633    CMP     Component Value Date/Time   NA 141 09/30/2021 1633   K 3.5 09/30/2021 1633   CL 99 09/30/2021 1633   CO2 32 09/30/2021 1633   GLUCOSE 141 (H) 09/30/2021 1633   BUN 20 09/30/2021 1633   CREATININE 0.88 09/30/2021 1633   CREATININE 0.83 02/22/2020 1616   CALCIUM 9.9 09/30/2021 1633   PROT 7.3 09/30/2021 1633   ALBUMIN 4.5 09/30/2021 1633   AST 17 09/30/2021 1633   ALT 16 09/30/2021 1633   ALKPHOS 54 09/30/2021 1633   BILITOT 0.5 09/30/2021 1633   GFRNONAA >60 10/17/2019 1135   GFRAA >60 10/17/2019 1135    Assessment: 1.  GERD: Controlled on Omeprazole 40 twice daily 2.  History of adenomatous polyps: Repeat recommended in 2028 3.  Whitehead near rectum  Plan: 1.  Reassured patient that  what she is feeling and is tender near her rectum is nothing to be concerned about.  Recommended hot compresses on this area if painful, but likely will still go away within the next few days, could also trial an over-the-counter antibacterial cream if she would like to put something on it. 2.  Next colonoscopy recommended in 2028 3.  Refilled Omeprazole 40 mg twice daily, 30-60 minutes before breakfast and dinner.  #180 with 3 refills sent to her [facility] per her request 4.  Patient to follow in clinic in a year or sooner if necessary.  Morgan Newer, PA-C [facility] 07/18/2022, 2:48 PM  Cc: Laurey Morale, MD

## 2022-07-18 NOTE — Patient Instructions (Signed)
We have sent the following medications to your pharmacy for you to pick up at your convenience: Omeprazole   Due to recent changes in healthcare laws, you may see the results of your imaging and laboratory studies on MyChart before your provider has had a chance to review them.  We understand that in some cases there may be results that are confusing or concerning to you. Not all laboratory results come back in the same time frame and the provider may be waiting for multiple results in order to interpret others.  Please give [place] 48 hours in order for your provider to thoroughly review all the results before contacting the office for clarification of your results.   _______________________________________________________  If your blood pressure at your visit was 140/90 or greater, please contact your primary care physician to follow up on this.  _______________________________________________________  If you are age 59 or older, your body mass index should be between 23-30. Your Body mass index is 36.78 kg/m. If this is out of the aforementioned range listed, please consider follow up with your Primary Care Provider.  If you are age 26 or younger, your body mass index should be between 19-25. Your Body mass index is 36.78 kg/m. If this is out of the aformentioned range listed, please consider follow up with your Primary Care Provider.   ________________________________________________________  The [facility] providers would like to encourage you to use [facility] to communicate with providers for non-urgent requests or questions.  Due to long hold times on the telephone, sending your provider a message by [facility] may be a faster and more efficient way to get a response.  Please allow 48 business hours for a response.  Please remember that this is for non-urgent requests.  _______________________________________________________  Thank you for choosing me and [facility].  Ellouise Newer PA-C

## 2022-07-20 ENCOUNTER — Encounter: Payer: Self-pay | Admitting: Family Medicine

## 2022-07-21 ENCOUNTER — Encounter: Payer: Self-pay | Admitting: Family Medicine

## 2022-07-21 MED ORDER — ALPRAZOLAM 1 MG PO TABS: 1.0000 mg | ORAL_TABLET | Freq: Every evening | ORAL | 1 refills | Status: DC | PRN

## 2022-07-21 NOTE — Telephone Encounter (Signed)
Take an antihistamine like Zyrtec

## 2022-07-21 NOTE — Progress Notes (Signed)
Agree with assessment / plan as outlined.  

## 2022-07-21 NOTE — Telephone Encounter (Signed)
Done

## 2022-07-25 DIAGNOSIS — G4733 Obstructive sleep apnea (adult) (pediatric): Secondary | ICD-10-CM | POA: Diagnosis not present

## 2022-08-11 ENCOUNTER — Ambulatory Visit: Payer: BC Managed Care – PPO | Admitting: Internal Medicine

## 2022-08-11 ENCOUNTER — Ambulatory Visit (INDEPENDENT_AMBULATORY_CARE_PROVIDER_SITE_OTHER): Payer: BC Managed Care – PPO | Admitting: Internal Medicine

## 2022-08-11 ENCOUNTER — Encounter: Payer: Self-pay | Admitting: Internal Medicine

## 2022-08-11 VITALS — BP 126/72 | HR 71 | Ht 64.0 in | Wt 211.0 lb

## 2022-08-11 DIAGNOSIS — E1165 Type 2 diabetes mellitus with hyperglycemia: Secondary | ICD-10-CM | POA: Diagnosis not present

## 2022-08-11 DIAGNOSIS — E1142 Type 2 diabetes mellitus with diabetic polyneuropathy: Secondary | ICD-10-CM

## 2022-08-11 LAB — POCT GLUCOSE (DEVICE FOR HOME USE): POC Glucose: 116 mg/dl — AB (ref 70–99)

## 2022-08-11 LAB — POCT GLYCOSYLATED HEMOGLOBIN (HGB A1C): Hemoglobin A1C: 8.1 % — AB (ref 4.0–5.6)

## 2022-08-11 MED ORDER — GLIMEPIRIDE 1 MG PO TABS: 1.0000 mg | ORAL_TABLET | Freq: Every day | ORAL | 3 refills | Status: DC

## 2022-08-11 NOTE — Progress Notes (Signed)
Name: Morgan [name]  Age/ Sex: 57 y.o., female   MRN/ DOB: [id number], 06-13-1966     PCP: Laurey Morale, MD   Reason for Endocrinology Evaluation: Type 2 Diabetes Mellitus  Initial Endocrine Consultative Visit: 02/16/2019    PATIENT IDENTIFIER: [name] is a 57 y.o. female with a past medical history of HTN, OSA,T2DM,and obesity, Ovarian cancer(S/P surgery )  and melanoma. The patient has followed with Endocrinology clinic since 02/16/2019 for consultative assistance with management of her diabetes.  DIABETIC HISTORY:  Morgan [name] was diagnosed with T2DM in 2000. She has been on Metformin, Glipizide and Actos since her diagnosis. Her hemoglobin A1c has ranged from 6.8% in 2018, peaking at 8.3% in 2020.  Rybelsus added in 01/2019 Pioglitazone stopped 05/2019  We attempted to discontinue glipizide in November 2022 due to hypoglycemia and an A1c of 6.1%, but this had to be restarted in May 2023 with an A1c 8.6%  Glimepiride was discontinued 03/2022 due to hypoglycemia  Normal 24-hr urinary cortisol 08/2020  SUBJECTIVE:   During the last visit (04/08/2022): A1c 6.5 %.    Today (08/11/2022): Morgan [name] is here for a  follow up on diabetes management.   She checks her blood sugars occasionally.   She admits to dietary indiscretions  She is seeing an anger management specialist  She has been noted with weight loss  She follows with  podiatry 02/20/2022 She follows  with pulmo for OSA  She continues to follow with neurology      HOME DIABETES REGIMEN:  Metformin 500 mg XR  ,1  tablet twice a day  Rybelsus 14 mg daily    METER DOWNLOAD SUMMARY: unable to download 90 day average 132   99- 171 mg/dL    DIABETIC COMPLICATIONS: Microvascular complications:    Denies: retinopathy, CKD, neuropathy  Last eye exam: scheduled 10/2021   Macrovascular complications:    Denies: CAD, PVD, CVA   HISTORY:  Past Medical History:  Past Medical  History:  Diagnosis Date   Anal fissure    saw Dr. Mercy Riding    Anxiety    on meds   Arthritis    on meds   Asthma    uses inhaler if needed   Carpal tunnel syndrome    Depression    on meds   Diabetes mellitus    on meds   GERD (gastroesophageal reflux disease)    on meds   History of ovarian cancer in adulthood    Hyperlipidemia    on meds   Hypertension    on meds   Ovarian cancer [facility]) [place]   Skin cancer (melanoma) ([place]) 1997   Sleep apnea    uses CPAP    Tubular adenoma of colon 01/2014   Past Surgical History:  Past Surgical History:  Procedure Laterality Date   ABDOMINAL HYSTERECTOMY  06/30/1984   BSO   BASAL CELL CARCINOMA EXCISION  10/09/2012   lower lip per Dr. Link Snuffer    COLONOSCOPY  03/23/2019   per Dr. Havery Moros, adenomatous polyps, repeat in 3 yrs   GLBS tumor ovary 1986     [place] Right 2007   MELANOMA EXCISION  07/01/1995   upper chest    Social History:  reports that she has never smoked. She has never used smokeless tobacco. She reports that she does not drink alcohol and does not use drugs. Family History:  Family History  Problem Relation Age of Onset   Dementia Mother    Asthma Mother    Bladder Cancer Mother 59   Multiple sclerosis Father    Ovarian cancer Sister 1   Diabetes Brother    Diabetes Brother    Diabetes Maternal Grandfather    Colon polyps Paternal Grandmother    Colon cancer Paternal Grandmother    Colon polyps Paternal Grandfather    Arthritis Other    Prostate cancer Other        grandfather per pt but she is unaware of which grandfather   Coronary artery disease Other    Pancreatic cancer Neg Hx    Stomach cancer Neg Hx    Esophageal cancer Neg Hx    Rectal cancer Neg Hx      HOME MEDICATIONS: Allergies as of 08/11/2022       Reactions   Lisinopril Cough        Medication List        Accurate as of August 11, 2022  1:46 PM. If you have any  questions, ask your nurse or doctor.          albuterol 108 (90 Base) MCG/ACT inhaler Commonly known as: VENTOLIN HFA Inhale 2 puffs into the lungs every 6 (six) hours as needed for wheezing or shortness of breath.   ALPRAZolam 1 MG tablet Commonly known as: XANAX Take 1 tablet (1 mg total) by mouth at bedtime as needed for sleep.   atorvastatin 80 MG tablet Commonly known as: LIPITOR Take 1 tablet (80 mg total) by mouth daily.   lamoTRIgine 25 MG tablet Commonly known as: LaMICtal Take 1 tablet (25 mg total) by mouth daily.   losartan-hydrochlorothiazide 50-12.5 MG tablet Commonly known as: HYZAAR Take 1 tablet by mouth daily.   metFORMIN 500 MG 24 hr tablet Commonly known as: GLUCOPHAGE-XR Take 1 tablet (500 mg total) by mouth in the morning and at bedtime.   metoprolol tartrate 50 MG tablet Commonly known as: LOPRESSOR Take 1 tablet (50 mg total) by mouth 2 (two) times daily.   omeprazole 40 MG capsule Commonly known as: PRILOSEC Take 1 capsule (40 mg total) by mouth 2 (two) times daily.   Rybelsus 14 MG Tabs Generic drug: Semaglutide Take 1 tablet (14 mg total) by mouth daily.   venlafaxine XR 150 MG 24 hr capsule Commonly known as: EFFEXOR-XR Take 1 (one) tablet by mouth daily.         OBJECTIVE:   Vital Signs: BP 126/72 (BP Location: Left Arm, Patient Position: Sitting, Cuff Size: Large)   Pulse 71   Ht 5' 4"$  (1.626 m)   Wt 211 lb (95.7 kg)   LMP 03/22/1986   SpO2 99%   BMI 36.22 kg/m   Wt Readings from Last 3 Encounters:  08/11/22 211 lb (95.7 kg)  07/18/22 214 lb 4 oz (97.2 kg)  07/07/22 217 lb (98.4 kg)     Exam: General: Pt appears well and is in NAD  Lungs: Clear with good BS bilat with no rales, rhonchi, or wheezes  Heart: RRR   Abdomen: Normoactive bowel sounds, soft, nontender, without masses or organomegaly palpable  Extremities: No pretibial edema.  Neuro: MS is good with appropriate affect, pt is alert and Ox3      DM Foot  exam 11/07/2021 The skin of the feet is intact without sores or ulcerations. The pedal pulses are 2+ on right and 2+ on left. The sensation is absent on the right  to a screening 5.07, 10 gram monofilament and decreased on the left    DATA REVIEWED:  Lab Results  Component Value Date   HGBA1C 8.1 (A) 08/11/2022   HGBA1C 6.5 (A) 04/08/2022   HGBA1C 8.6 (H) 09/30/2021     Latest Reference Range & Units 09/30/21 16:33  Total CHOL/HDL Ratio  4  Cholesterol 0 - 200 mg/dL 164  HDL Cholesterol >39.00 mg/dL 42.60  Direct LDL mg/dL 77.0  NonHDL  121.35  Triglycerides 0.0 - 149.0 mg/dL 304.0 (H)  VLDL 0.0 - 40.0 mg/dL 60.8 (H)    Latest Reference Range & Units 09/30/21 16:33  Sodium 135 - 145 mEq/L 141  Potassium 3.5 - 5.1 mEq/L 3.5  Chloride 96 - 112 mEq/L 99  CO2 19 - 32 mEq/L 32  Glucose 70 - 99 mg/dL 141 (H)  BUN 6 - 23 mg/dL 20  Creatinine 0.40 - 1.20 mg/dL 0.88  Calcium 8.4 - 10.5 mg/dL 9.9  Alkaline Phosphatase 39 - 117 U/L 54  Albumin 3.5 - 5.2 g/dL 4.5  AST 0 - 37 U/L 17  ALT 0 - 35 U/L 16  Total Protein 6.0 - 8.3 g/dL 7.3  Bilirubin, Direct 0.0 - 0.3 mg/dL 0.1  Total Bilirubin 0.2 - 1.2 mg/dL 0.5  GFR >60.00 mL/min 72.67      ASSESSMENT / PLAN / RECOMMENDATIONS:   1) Type 2 Diabetes Mellitus,Poorly  Controlled, With Neuropathic  complications - Most recent A1c of 8.1 %. Goal A1c < 7.0 %.   -Patient with worsening glycemic control, this is attributed due to discontinuation of glimepiride.  She did contact us with hypoglycemia back in October, but it appears that she has been taking glimepiride incorrectly, at bedtime.  We discussed the importance of taking glimepiride before the first meal of the day, at this time her lunch is the first meal of the day, I have advised her to take it before lunch.  She is unable to take it 15-20 minutes before the meal, but I did advise her to just take it just before the meal if and able to wait that long -Rybelsus is costing her $80  for 3 months, she was provided with a coupon today -Patient continues with dietary indiscretions -She is intolerant to higher doses of metformin   MEDICATIONS:  -Restart glimepiride 1 mg daily  - Continue metformin 500 mg XR, 1 tablet BID   -Continue Rybelsus 14 mg daily    EDUCATION / INSTRUCTIONS: BG monitoring instructions: Patient is instructed to check her blood sugars 3 times a week, fasting. Call [facility] if: BG persistently < 70  I reviewed the Rule of 15 for the treatment of hypoglycemia in detail with the patient. Literature supplied.    2) Diabetic complications:  Eye: Does not  have known diabetic retinopathy.  Neuro/ Feet: Does not have known diabetic peripheral neuropathy. Renal: Patient does not have known baseline CKD. Normal MA/Cr ratio         F/U in 4-6 months    Signed electronically by: Mack Guise, MD  [facility]  [facility] [address] [place], [place] [address] Phone: [phone] FAX: [phone]   CC: Laurey Morale, [place] [address] Phone: [phone]  Fax: [phone]  Return to Endocrinology clinic as below: No future appointments.

## 2022-08-11 NOTE — Patient Instructions (Addendum)
-   Restart  Glimepiride 1 mg , 1 tablet before lunch  - Continue Metformin 500 mg 1 tablet twice daily - Continue Rybelsus 14 mg , 1 tablet every morning    HOW TO TREAT LOW BLOOD SUGARS (Blood sugar LESS THAN 70 MG/DL) Please follow the RULE OF 15 for the treatment of hypoglycemia treatment (when your (blood sugars are less than 70 mg/dL)   STEP 1: Take 15 grams of carbohydrates when your blood sugar is low, which includes:  3-4 GLUCOSE TABS  OR 3-4 OZ OF JUICE OR REGULAR SODA OR ONE TUBE OF GLUCOSE GEL    STEP 2: RECHECK blood sugar in 15 MINUTES STEP 3: If your blood sugar is still low at the 15 minute recheck --> then, go back to STEP 1 and treat AGAIN with another 15 grams of carbohydrates.

## 2022-08-14 DIAGNOSIS — F321 Major depressive disorder, single episode, moderate: Secondary | ICD-10-CM | POA: Diagnosis not present

## 2022-08-20 DIAGNOSIS — M9903 Segmental and somatic dysfunction of lumbar region: Secondary | ICD-10-CM | POA: Diagnosis not present

## 2022-08-20 DIAGNOSIS — M9901 Segmental and somatic dysfunction of cervical region: Secondary | ICD-10-CM | POA: Diagnosis not present

## 2022-08-20 DIAGNOSIS — M9906 Segmental and somatic dysfunction of lower extremity: Secondary | ICD-10-CM | POA: Diagnosis not present

## 2022-08-20 DIAGNOSIS — M9902 Segmental and somatic dysfunction of thoracic region: Secondary | ICD-10-CM | POA: Diagnosis not present

## 2022-08-20 DIAGNOSIS — M9905 Segmental and somatic dysfunction of pelvic region: Secondary | ICD-10-CM | POA: Diagnosis not present

## 2022-08-25 DIAGNOSIS — G4733 Obstructive sleep apnea (adult) (pediatric): Secondary | ICD-10-CM | POA: Diagnosis not present

## 2022-08-28 ENCOUNTER — Encounter: Payer: Self-pay | Admitting: Family Medicine

## 2022-08-28 DIAGNOSIS — F321 Major depressive disorder, single episode, moderate: Secondary | ICD-10-CM | POA: Diagnosis not present

## 2022-08-29 ENCOUNTER — Other Ambulatory Visit: Payer: Self-pay

## 2022-08-29 MED ORDER — NORTRIPTYLINE HCL 10 MG PO CAPS: 10.0000 mg | ORAL_CAPSULE | Freq: Every day | ORAL | 2 refills | Status: DC

## 2022-08-29 NOTE — Telephone Encounter (Signed)
I understand. Stop the Lamotrigine. Instead call in Nortriptyline 10 mg at bedtime, #30 with 2 rf. Report back in a few weeks

## 2022-09-04 ENCOUNTER — Encounter: Payer: Self-pay | Admitting: Radiology

## 2022-09-05 DIAGNOSIS — F321 Major depressive disorder, single episode, moderate: Secondary | ICD-10-CM | POA: Diagnosis not present

## 2022-09-08 ENCOUNTER — Ambulatory Visit (INDEPENDENT_AMBULATORY_CARE_PROVIDER_SITE_OTHER): Payer: BC Managed Care – PPO | Admitting: Physician Assistant

## 2022-09-08 ENCOUNTER — Encounter: Payer: Self-pay | Admitting: Physician Assistant

## 2022-09-08 DIAGNOSIS — G5601 Carpal tunnel syndrome, right upper limb: Secondary | ICD-10-CM

## 2022-09-08 DIAGNOSIS — G5602 Carpal tunnel syndrome, left upper limb: Secondary | ICD-10-CM

## 2022-09-08 NOTE — Progress Notes (Signed)
HPI: Morgan [name] comes in today requesting bilateral carpal tunnel injections.  She has known severe carpal tunnel bilaterally.  She states she is having more pain in both hands numbness tingling.  States the injection right hand for carpal tunnel syndrome given November 2023 gave her good relief until about 2 months ago.  She is wearing braces at night she is taking ibuprofen.  She is using Voltaren gel.  She is diabetic last hemoglobin A1c was 8.1.  Discussed with her again recommend carpal tunnel release given the fact that she has severe carpal tunnel syndrome.  Given her recent hemoglobin A1c would not recommend cortisone injections today.  She will follow-up with [place] as needed.  No charge for today's office

## 2022-09-12 ENCOUNTER — Encounter: Payer: Self-pay | Admitting: Family Medicine

## 2022-09-12 ENCOUNTER — Other Ambulatory Visit: Payer: Self-pay

## 2022-09-12 DIAGNOSIS — I10 Essential (primary) hypertension: Secondary | ICD-10-CM

## 2022-09-12 MED ORDER — LOSARTAN POTASSIUM-HCTZ 50-12.5 MG PO TABS: 1.0000 | ORAL_TABLET | Freq: Every day | ORAL | 1 refills | Status: DC

## 2022-09-18 ENCOUNTER — Encounter: Payer: Self-pay | Admitting: Family Medicine

## 2022-09-18 NOTE — Telephone Encounter (Signed)
I am not aware of any medication to "boost energy". Exercise helps, have her take a walk every day

## 2022-09-22 ENCOUNTER — Encounter (HOSPITAL_BASED_OUTPATIENT_CLINIC_OR_DEPARTMENT_OTHER): Payer: Self-pay | Admitting: Urology

## 2022-09-22 ENCOUNTER — Ambulatory Visit: Payer: BC Managed Care – PPO | Admitting: Family Medicine

## 2022-09-22 ENCOUNTER — Inpatient Hospital Stay (HOSPITAL_BASED_OUTPATIENT_CLINIC_OR_DEPARTMENT_OTHER)
Admission: EM | Admit: 2022-09-22 | Discharge: 2022-09-25 | DRG: 372 | Disposition: A | Discharge status: Home / Self Care | Payer: BC Managed Care – PPO | Attending: Family Medicine | Admitting: Family Medicine

## 2022-09-22 ENCOUNTER — Other Ambulatory Visit: Payer: Self-pay

## 2022-09-22 ENCOUNTER — Encounter: Payer: Self-pay | Admitting: Family Medicine

## 2022-09-22 ENCOUNTER — Emergency Department (HOSPITAL_BASED_OUTPATIENT_CLINIC_OR_DEPARTMENT_OTHER): Payer: BC Managed Care – PPO

## 2022-09-22 DIAGNOSIS — I959 Hypotension, unspecified: Secondary | ICD-10-CM | POA: Diagnosis present

## 2022-09-22 DIAGNOSIS — Z7984 Long term (current) use of oral hypoglycemic drugs: Secondary | ICD-10-CM

## 2022-09-22 DIAGNOSIS — E11649 Type 2 diabetes mellitus with hypoglycemia without coma: Secondary | ICD-10-CM | POA: Diagnosis not present

## 2022-09-22 DIAGNOSIS — Z833 Family history of diabetes mellitus: Secondary | ICD-10-CM

## 2022-09-22 DIAGNOSIS — G8929 Other chronic pain: Secondary | ICD-10-CM | POA: Diagnosis not present

## 2022-09-22 DIAGNOSIS — Z8052 Family history of malignant neoplasm of bladder: Secondary | ICD-10-CM

## 2022-09-22 DIAGNOSIS — K529 Noninfective gastroenteritis and colitis, unspecified: Secondary | ICD-10-CM | POA: Diagnosis present

## 2022-09-22 DIAGNOSIS — Z8041 Family history of malignant neoplasm of ovary: Secondary | ICD-10-CM

## 2022-09-22 DIAGNOSIS — E1142 Type 2 diabetes mellitus with diabetic polyneuropathy: Secondary | ICD-10-CM | POA: Diagnosis present

## 2022-09-22 DIAGNOSIS — Z85820 Personal history of malignant melanoma of skin: Secondary | ICD-10-CM | POA: Diagnosis not present

## 2022-09-22 DIAGNOSIS — F32A Depression, unspecified: Secondary | ICD-10-CM | POA: Diagnosis not present

## 2022-09-22 DIAGNOSIS — Z79899 Other long term (current) drug therapy: Secondary | ICD-10-CM | POA: Diagnosis not present

## 2022-09-22 DIAGNOSIS — Z800 Family history of malignant neoplasm of digestive organs: Secondary | ICD-10-CM

## 2022-09-22 DIAGNOSIS — R296 Repeated falls: Secondary | ICD-10-CM | POA: Diagnosis not present

## 2022-09-22 DIAGNOSIS — E669 Obesity, unspecified: Secondary | ICD-10-CM | POA: Diagnosis present

## 2022-09-22 DIAGNOSIS — Z6836 Body mass index (BMI) 36.0-36.9, adult: Secondary | ICD-10-CM

## 2022-09-22 DIAGNOSIS — K219 Gastro-esophageal reflux disease without esophagitis: Secondary | ICD-10-CM | POA: Diagnosis present

## 2022-09-22 DIAGNOSIS — Z1152 Encounter for screening for COVID-19: Secondary | ICD-10-CM | POA: Diagnosis not present

## 2022-09-22 DIAGNOSIS — Z85828 Personal history of other malignant neoplasm of skin: Secondary | ICD-10-CM

## 2022-09-22 DIAGNOSIS — E860 Dehydration: Secondary | ICD-10-CM

## 2022-09-22 DIAGNOSIS — G4733 Obstructive sleep apnea (adult) (pediatric): Secondary | ICD-10-CM | POA: Diagnosis not present

## 2022-09-22 DIAGNOSIS — I10 Essential (primary) hypertension: Secondary | ICD-10-CM | POA: Diagnosis present

## 2022-09-22 DIAGNOSIS — Z8249 Family history of ischemic heart disease and other diseases of the circulatory system: Secondary | ICD-10-CM

## 2022-09-22 DIAGNOSIS — F419 Anxiety disorder, unspecified: Secondary | ICD-10-CM | POA: Diagnosis present

## 2022-09-22 DIAGNOSIS — K644 Residual hemorrhoidal skin tags: Secondary | ICD-10-CM | POA: Diagnosis present

## 2022-09-22 DIAGNOSIS — Z90710 Acquired absence of both cervix and uterus: Secondary | ICD-10-CM

## 2022-09-22 DIAGNOSIS — A045 Campylobacter enteritis: Secondary | ICD-10-CM | POA: Diagnosis present

## 2022-09-22 DIAGNOSIS — K5900 Constipation, unspecified: Secondary | ICD-10-CM | POA: Diagnosis present

## 2022-09-22 DIAGNOSIS — J4489 Other specified chronic obstructive pulmonary disease: Secondary | ICD-10-CM | POA: Diagnosis present

## 2022-09-22 DIAGNOSIS — E785 Hyperlipidemia, unspecified: Secondary | ICD-10-CM | POA: Diagnosis not present

## 2022-09-22 DIAGNOSIS — R197 Diarrhea, unspecified: Secondary | ICD-10-CM | POA: Diagnosis not present

## 2022-09-22 DIAGNOSIS — Z8543 Personal history of malignant neoplasm of ovary: Secondary | ICD-10-CM

## 2022-09-22 DIAGNOSIS — Z825 Family history of asthma and other chronic lower respiratory diseases: Secondary | ICD-10-CM

## 2022-09-22 DIAGNOSIS — E872 Acidosis, unspecified: Secondary | ICD-10-CM | POA: Diagnosis present

## 2022-09-22 DIAGNOSIS — E538 Deficiency of other specified B group vitamins: Secondary | ICD-10-CM | POA: Diagnosis not present

## 2022-09-22 DIAGNOSIS — L578 Other skin changes due to chronic exposure to nonionizing radiation: Secondary | ICD-10-CM | POA: Insufficient documentation

## 2022-09-22 DIAGNOSIS — R109 Unspecified abdominal pain: Secondary | ICD-10-CM | POA: Diagnosis not present

## 2022-09-22 DIAGNOSIS — E8881 Metabolic syndrome: Secondary | ICD-10-CM | POA: Diagnosis present

## 2022-09-22 DIAGNOSIS — E876 Hypokalemia: Secondary | ICD-10-CM

## 2022-09-22 DIAGNOSIS — Z820 Family history of epilepsy and other diseases of the nervous system: Secondary | ICD-10-CM

## 2022-09-22 LAB — I-STAT VENOUS BLOOD GAS, ED
Acid-Base Excess: 1 mmol/L (ref 0.0–2.0)
Bicarbonate: 26.6 mmol/L (ref 20.0–28.0)
Calcium, Ion: 1.08 mmol/L — ABNORMAL LOW (ref 1.15–1.40)
HCT: 36 % (ref 36.0–46.0)
Hemoglobin: 12.2 g/dL (ref 12.0–15.0)
O2 Saturation: 74 %
Patient temperature: 98.6
Potassium: 2.6 mmol/L — CL (ref 3.5–5.1)
Sodium: 140 mmol/L (ref 135–145)
TCO2: 28 mmol/L (ref 22–32)
pCO2, Ven: 43.2 mmHg — ABNORMAL LOW (ref 44–60)
pH, Ven: 7.397 (ref 7.25–7.43)
pO2, Ven: 40 mmHg (ref 32–45)

## 2022-09-22 LAB — CBC WITH DIFFERENTIAL/PLATELET
Abs Immature Granulocytes: 0.28 10*3/uL — ABNORMAL HIGH (ref 0.00–0.07)
Basophils Absolute: 0 10*3/uL (ref 0.0–0.1)
Basophils Relative: 0 %
Eosinophils Absolute: 0.1 10*3/uL (ref 0.0–0.5)
Eosinophils Relative: 1 %
HCT: 36.9 % (ref 36.0–46.0)
Hemoglobin: 12.1 g/dL (ref 12.0–15.0)
Immature Granulocytes: 3 %
Lymphocytes Relative: 24 %
Lymphs Abs: 2.1 10*3/uL (ref 0.7–4.0)
MCH: 29.7 pg (ref 26.0–34.0)
MCHC: 32.8 g/dL (ref 30.0–36.0)
MCV: 90.4 fL (ref 80.0–100.0)
Monocytes Absolute: 1.2 10*3/uL — ABNORMAL HIGH (ref 0.1–1.0)
Monocytes Relative: 13 %
Neutro Abs: 5.3 10*3/uL (ref 1.7–7.7)
Neutrophils Relative %: 59 %
Platelets: 227 10*3/uL (ref 150–400)
RBC: 4.08 MIL/uL (ref 3.87–5.11)
RDW: 14.7 % (ref 11.5–15.5)
WBC: 9.1 10*3/uL (ref 4.0–10.5)
nRBC: 0 % (ref 0.0–0.2)

## 2022-09-22 LAB — GLUCOSE, CAPILLARY
Glucose-Capillary: 101 mg/dL — ABNORMAL HIGH (ref 70–99)
Glucose-Capillary: 86 mg/dL (ref 70–99)

## 2022-09-22 LAB — COMPREHENSIVE METABOLIC PANEL
ALT: 29 U/L (ref 0–44)
AST: 28 U/L (ref 15–41)
Albumin: 3.7 g/dL (ref 3.5–5.0)
Alkaline Phosphatase: 55 U/L (ref 38–126)
Anion gap: 11 (ref 5–15)
BUN: 17 mg/dL (ref 6–20)
CO2: 26 mmol/L (ref 22–32)
Calcium: 8.3 mg/dL — ABNORMAL LOW (ref 8.9–10.3)
Chloride: 99 mmol/L (ref 98–111)
Creatinine, Ser: 0.84 mg/dL (ref 0.44–1.00)
GFR, Estimated: >60 mL/min (ref >60)
Glucose, Bld: 133 mg/dL — ABNORMAL HIGH (ref 70–99)
Potassium: 2.6 mmol/L — CL (ref 3.5–5.1)
Sodium: 136 mmol/L (ref 135–145)
Total Bilirubin: 0.8 mg/dL (ref 0.3–1.2)
Total Protein: 7 g/dL (ref 6.5–8.1)

## 2022-09-22 LAB — URINALYSIS, ROUTINE W REFLEX MICROSCOPIC
Bilirubin Urine: NEGATIVE
Glucose, UA: NEGATIVE mg/dL
Hgb urine dipstick: NEGATIVE
Ketones, ur: NEGATIVE mg/dL
Leukocytes,Ua: NEGATIVE
Nitrite: NEGATIVE
Protein, ur: NEGATIVE mg/dL
Specific Gravity, Urine: 1.02 (ref 1.005–1.030)
pH: 5 (ref 5.0–8.0)

## 2022-09-22 LAB — PHOSPHORUS: Phosphorus: 2.8 mg/dL (ref 2.5–4.6)

## 2022-09-22 LAB — LACTIC ACID, PLASMA
Lactic Acid, Venous: 2 mmol/L (ref 0.5–1.9)
Lactic Acid, Venous: 1.6 mmol/L (ref 0.5–1.9)

## 2022-09-22 LAB — RESP PANEL BY RT-PCR (RSV, FLU A&B, COVID)  RVPGX2
Influenza A by PCR: NEGATIVE
Influenza B by PCR: NEGATIVE
Resp Syncytial Virus by PCR: NEGATIVE
SARS Coronavirus 2 by RT PCR: NEGATIVE

## 2022-09-22 LAB — CBG MONITORING, ED
Glucose-Capillary: 141 mg/dL — ABNORMAL HIGH (ref 70–99)
Glucose-Capillary: 53 mg/dL — ABNORMAL LOW (ref 70–99)
Glucose-Capillary: 97 mg/dL (ref 70–99)

## 2022-09-22 LAB — MAGNESIUM: Magnesium: 1.7 mg/dL (ref 1.7–2.4)

## 2022-09-22 MED ORDER — IOHEXOL 300 MG/ML  SOLN
100.0000 mL | Freq: Once | INTRAMUSCULAR | Status: AC | PRN
  Administered 2022-09-22: 100 mL via INTRAVENOUS

## 2022-09-22 MED ORDER — ONDANSETRON HCL 4 MG/2ML IJ SOLN
4.0000 mg | Freq: Once | INTRAMUSCULAR | Status: AC
  Administered 2022-09-22: 4 mg via INTRAVENOUS
  Filled 2022-09-22: qty 2

## 2022-09-22 MED ORDER — ENOXAPARIN SODIUM 60 MG/0.6ML IJ SOSY
50.0000 mg | PREFILLED_SYRINGE | INTRAMUSCULAR | Status: DC
  Administered 2022-09-22 – 2022-09-24 (×3): 50 mg via SUBCUTANEOUS
  Filled 2022-09-22 (×3): qty 0.6

## 2022-09-22 MED ORDER — NORTRIPTYLINE HCL 10 MG PO CAPS
10.0000 mg | ORAL_CAPSULE | Freq: Every day | ORAL | Status: DC
  Administered 2022-09-22 – 2022-09-24 (×3): 10 mg via ORAL
  Filled 2022-09-22 (×3): qty 1

## 2022-09-22 MED ORDER — HYDROMORPHONE HCL 1 MG/ML IJ SOLN
0.5000 mg | INTRAMUSCULAR | Status: AC | PRN
  Administered 2022-09-23 – 2022-09-24 (×3): 1 mg via INTRAVENOUS
  Filled 2022-09-22 (×3): qty 1

## 2022-09-22 MED ORDER — SODIUM CHLORIDE 0.9 % IV SOLN: INTRAVENOUS | Status: DC | PRN

## 2022-09-22 MED ORDER — HYDROMORPHONE HCL 1 MG/ML IJ SOLN
1.0000 mg | INTRAMUSCULAR | Status: DC | PRN
  Administered 2022-09-22: 0.5 mg via INTRAVENOUS
  Filled 2022-09-22: qty 1

## 2022-09-22 MED ORDER — VENLAFAXINE HCL ER 150 MG PO CP24
150.0000 mg | ORAL_CAPSULE | Freq: Every day | ORAL | Status: DC
  Administered 2022-09-23 – 2022-09-25 (×3): 150 mg via ORAL
  Filled 2022-09-22 (×3): qty 1

## 2022-09-22 MED ORDER — SODIUM CHLORIDE 0.9 % IV BOLUS
1000.0000 mL | Freq: Once | INTRAVENOUS | Status: AC
  Administered 2022-09-22: 1000 mL via INTRAVENOUS

## 2022-09-22 MED ORDER — POTASSIUM CHLORIDE 10 MEQ/100ML IV SOLN
10.0000 meq | INTRAVENOUS | Status: AC
  Administered 2022-09-22 (×4): 10 meq via INTRAVENOUS
  Filled 2022-09-22 (×4): qty 100

## 2022-09-22 MED ORDER — ALPRAZOLAM 0.5 MG PO TABS
1.0000 mg | ORAL_TABLET | Freq: Every evening | ORAL | Status: DC | PRN
  Administered 2022-09-22 – 2022-09-24 (×2): 1 mg via ORAL
  Filled 2022-09-22 (×2): qty 2

## 2022-09-22 MED ORDER — ACETAMINOPHEN 650 MG RE SUPP: 650.0000 mg | Freq: Four times a day (QID) | RECTAL | Status: DC | PRN

## 2022-09-22 MED ORDER — ACETAMINOPHEN 325 MG PO TABS
650.0000 mg | ORAL_TABLET | Freq: Four times a day (QID) | ORAL | Status: DC | PRN
  Administered 2022-09-24: 650 mg via ORAL
  Filled 2022-09-22: qty 2

## 2022-09-22 MED ORDER — GLIMEPIRIDE 1 MG PO TABS
1.0000 mg | ORAL_TABLET | Freq: Every day | ORAL | Status: DC
  Administered 2022-09-23 – 2022-09-25 (×3): 1 mg via ORAL
  Filled 2022-09-22 (×3): qty 1

## 2022-09-22 MED ORDER — PANTOPRAZOLE SODIUM 40 MG PO TBEC
40.0000 mg | DELAYED_RELEASE_TABLET | Freq: Two times a day (BID) | ORAL | Status: DC
  Administered 2022-09-22 – 2022-09-24 (×5): 40 mg via ORAL
  Filled 2022-09-22 (×5): qty 1

## 2022-09-22 MED ORDER — INSULIN ASPART 100 UNIT/ML IJ SOLN: 0.0000 [IU] | Freq: Three times a day (TID) | INTRAMUSCULAR | Status: DC

## 2022-09-22 MED ORDER — METOPROLOL TARTRATE 50 MG PO TABS
50.0000 mg | ORAL_TABLET | Freq: Two times a day (BID) | ORAL | Status: DC
  Administered 2022-09-22 – 2022-09-23 (×2): 50 mg via ORAL
  Filled 2022-09-22 (×2): qty 1

## 2022-09-22 MED ORDER — POTASSIUM CHLORIDE IN NACL 40-0.9 MEQ/L-% IV SOLN
INTRAVENOUS | Status: AC
  Filled 2022-09-22 (×2): qty 1000

## 2022-09-22 MED ORDER — PROCHLORPERAZINE EDISYLATE 10 MG/2ML IJ SOLN: 10.0000 mg | Freq: Four times a day (QID) | INTRAMUSCULAR | Status: DC | PRN

## 2022-09-22 MED ORDER — ATORVASTATIN CALCIUM 40 MG PO TABS
80.0000 mg | ORAL_TABLET | Freq: Every day | ORAL | Status: DC
  Administered 2022-09-22 – 2022-09-24 (×3): 80 mg via ORAL
  Filled 2022-09-22 (×3): qty 2

## 2022-09-22 MED ORDER — MAGNESIUM SULFATE 2 GM/50ML IV SOLN
2.0000 g | Freq: Once | INTRAVENOUS | Status: AC
  Administered 2022-09-22: 2 g via INTRAVENOUS
  Filled 2022-09-22: qty 50

## 2022-09-22 NOTE — ED Notes (Signed)
Pt leaving car here, blue corolla, security notified.

## 2022-09-22 NOTE — ED Triage Notes (Signed)
Pt states diarrhea x 1 week and now feels dehydrated Mucus membranes very dry as well  Denies any pain No recent antibiotics

## 2022-09-22 NOTE — Progress Notes (Signed)
Plan of Care Note for accepted transfer   Patient: Morgan [name] MRN: [id number]   [place]: 09/22/2022  Facility requesting transfer: [facility].  Requesting Provider: Nanda Quinton, MD Reason for transfer: Hypokalemia, N/V/D and mild hypoglycemia. Facility course:  Per EDP: " Chief Complaint  Patient presents with   Diarrhea   Dehydration      Morgan [name] is a 57 y.o. female past medical history of asthma, diabetes, hyperlipidemia, hypertension, ovarian cancer presents today for evaluation of diarrhea, vomiting, weakness.  Patient has had diarrhea for 7 days.  She started to have vomiting since Friday.  She denies any fever.  Endorses nausea and vomiting, unable to keep p.o. intake down.  She endorses runny nose and low back pain.  No known sick contact.  Denies any blood in her stool or urine."  Plan of care: The patient is accepted for admission to Telemetry unit, at [facility].  Author: Reubin Milan, MD 09/22/2022  Check [email or web address] for on-call coverage.  Nursing staff, Please call [place] number on Amion as soon as patient's arrival, so appropriate admitting provider can evaluate the pt.

## 2022-09-22 NOTE — H&P (Signed)
History and Physical    Patient: Morgan [name] [id number] DOB: May 12, 1966 DOA: 09/22/2022 DOS: the patient was seen and examined on 09/22/2022 PCP: Laurey Morale, MD  Patient coming from: Home  Chief Complaint:  Chief Complaint  Patient presents with   Diarrhea   Dehydration   HPI: Morgan [name] is a 57 y.o. female with medical history significant of anal fissure, anxiety, osteoarthritis, asthma, carpal tunnel syndrome, depression, type 2 diabetes, GERD, ovarian cancer, hyperlipidemia, hypertension, melanoma, sleep apnea on CPAP who presented to the emergency department complaints of multiple daily episodes of diarrhea since about a week ago associated with colicky abdominal pain, fatigue and nausea/3 episodes of emesis yesterday after she had a breakfast meal from [employer].   No constipation, melena or hematochezia.  No flank pain, dysuria, frequency or hematuria. She denied fever, chills, rhinorrhea, sore throat, wheezing or hemoptysis.  No chest pain, palpitations, diaphoresis, PND, orthopnea or pitting edema of the lower extremities.  No polyuria, polydipsia, polyphagia or blurred vision.   Lab work: Coronavirus, influenza and RSV PCR was negative.  Unremarkable CBC.  I-STAT venous blood gas showed a pCO2 of 43.2 mmHg and potassium 2.6 mmol/L.  CMP also with a potassium of 2.6 mmol/L, glucose 133 and calcium 8.3 mg/dL.  Lactic acid was 2.0 and then 1.6 mmol/L.  Imaging: CT abdomen/pelvis with contrast with minimal mucosal thickening at the rectosigmoid junction that is nonspecific.  There is atelectasis versus chronic interstitial changes in the dependent portions of the bilateral lower lobes.  ED course: Initial vital signs were temperature 97.9 F, pulse 67, respirations 20, BP 73/35 mmHg O2 sat 97% on room air.  The patient received ondansetron 4 mg IVP, KCl 10 mEq IVPB x 4 and 1000 mL normal saline bolus.   Review of Systems: As mentioned in the history of  present illness. All other systems reviewed and are negative. Past Medical History:  Diagnosis Date   Anal fissure    saw Dr. Mercy Riding    Anxiety    on meds   Arthritis    on meds   Asthma    uses inhaler if needed   Carpal tunnel syndrome    Depression    on meds   Diabetes mellitus    on meds   GERD (gastroesophageal reflux disease)    on meds   History of ovarian cancer in adulthood    Hyperlipidemia    on meds   Hypertension    on meds   Ovarian cancer [facility] [name]   Skin cancer (melanoma) ([place]) 1997   Sleep apnea    uses CPAP    Tubular adenoma of colon 01/2014   Past Surgical History:  Procedure Laterality Date   ABDOMINAL HYSTERECTOMY  06/30/1984   BSO   BASAL CELL CARCINOMA EXCISION  10/09/2012   lower lip per Dr. Link Snuffer    COLONOSCOPY  03/23/2019   per Dr. Havery Moros, adenomatous polyps, repeat in 3 yrs   GLBS tumor ovary 1986     [place] Right 2007   MELANOMA EXCISION  07/01/1995   upper chest    Social History:  reports that she has never smoked. She has never used smokeless tobacco. She reports that she does not drink alcohol and does not use drugs.  Allergies  Allergen Reactions   Lisinopril Cough    Family History  Problem Relation Age of Onset   Dementia Mother  Asthma Mother    Bladder Cancer Mother 57   Multiple sclerosis Father    Ovarian cancer Sister 23   Diabetes Brother    Diabetes Brother    Diabetes Maternal Grandfather    Colon polyps Paternal Grandmother    Colon cancer Paternal Grandmother    Colon polyps Paternal Grandfather    Arthritis Other    Prostate cancer Other        grandfather per pt but she is unaware of which grandfather   Coronary artery disease Other    Pancreatic cancer Neg Hx    Stomach cancer Neg Hx    Esophageal cancer Neg Hx    Rectal cancer Neg Hx     Prior to Admission medications   Medication Sig Start Date End Date Taking? Authorizing  Provider  albuterol (VENTOLIN HFA) 108 (90 Base) MCG/ACT inhaler Inhale 2 puffs into the lungs every 6 (six) hours as needed for wheezing or shortness of breath. 01/08/21   Chesley Mires, MD  ALPRAZolam Duanne Moron) 1 MG tablet Take 1 tablet (1 mg total) by mouth at bedtime as needed for sleep. 07/21/22   Laurey Morale, MD  atorvastatin (LIPITOR) 80 MG tablet Take 1 tablet (80 mg total) by mouth daily. 06/16/22   Laurey Morale, MD  glimepiride (AMARYL) 1 MG tablet Take 1 tablet (1 mg total) by mouth daily with breakfast. 08/11/22   Shamleffer, Melanie Crazier, MD  losartan-hydrochlorothiazide (HYZAAR) 50-12.5 MG tablet Take 1 tablet by mouth daily. 09/12/22   Laurey Morale, MD  metFORMIN (GLUCOPHAGE-XR) 500 MG 24 hr tablet Take 1 tablet (500 mg total) by mouth in the morning and at bedtime. 04/08/22   Shamleffer, Melanie Crazier, MD  metoprolol tartrate (LOPRESSOR) 50 MG tablet Take 1 tablet (50 mg total) by mouth 2 (two) times daily. 05/26/22   Laurey Morale, MD  nortriptyline (PAMELOR) 10 MG capsule Take 1 capsule (10 mg total) by mouth at bedtime. 08/29/22   Laurey Morale, MD  omeprazole (PRILOSEC) 40 MG capsule Take 1 capsule (40 mg total) by mouth 2 (two) times daily. 07/18/22   Levin Erp, PA  Semaglutide (RYBELSUS) 14 MG TABS Take 1 tablet (14 mg total) by mouth daily. 04/08/22   Shamleffer, Melanie Crazier, MD  venlafaxine XR (EFFEXOR-XR) 150 MG 24 hr capsule Take 1 (one) tablet by mouth daily. 03/27/22   Laurey Morale, MD    Physical Exam: Vitals:   09/22/22 1245 09/22/22 1300 09/22/22 1315 09/22/22 1452  BP: (!) 88/75 102/80 102/80 112/74  Pulse:   72 69  Resp:   18 17  Temp:   (!) 97.5 F (36.4 C) (!) 97.4 F (36.3 C)  TempSrc:   Oral   SpO2:   100% 100%  Weight:      Height:       Physical Exam Vitals and nursing note reviewed.  Constitutional:      General: She is awake. She is not in acute distress.    Appearance: Normal appearance.  HENT:     Head: Normocephalic.      Nose: No rhinorrhea.     Mouth/Throat:     Mouth: Mucous membranes are dry.  Eyes:     General: No scleral icterus.    Pupils: Pupils are equal, round, and reactive to light.  Neck:     Vascular: No JVD.  Cardiovascular:     Rate and Rhythm: Normal rate and regular rhythm.     Heart sounds: S1 normal  and S2 normal.  Pulmonary:     Effort: Pulmonary effort is normal.     Breath sounds: Normal breath sounds.  Abdominal:     General: Bowel sounds are normal. There is no distension.     Palpations: Abdomen is soft.     Tenderness: There is abdominal tenderness. There is no right CVA tenderness, left CVA tenderness or guarding.  Musculoskeletal:     Cervical back: Neck supple.     Right lower leg: No edema.     Left lower leg: No edema.  Skin:    General: Skin is warm and dry.  Neurological:     General: No focal deficit present.     Mental Status: She is alert and oriented to person, place, and time.  Psychiatric:        Mood and Affect: Mood normal.        Behavior: Behavior normal. Behavior is cooperative.     Data Reviewed:  Results are pending, will review when available.  EKG: Vent. rate 66 BPM PR interval 161 ms QRS duration 109 ms QT/QTcB 424/445 ms P-R-T axes 45 52 45 Sinus rhythm Low voltage, precordial leads Borderline T abnormalities, anterior leads  Assessment and Plan: Principal Problem:   Hypokalemia Observation/telemetry. Continue potassium replacement. Magnesium sulfate 2 g IVPB given earlier. Follow-up potassium level in AM.  Active Problems:   Acute gastroenteritis Superimposed on history of IBS. Check GI pathogen by PCR. Continue IV fluids and symptomatic treatment.    Type 2 diabetes mellitus with diabetic polyneuropathy,    without long-term current use of insulin (HCC)  Continue clear liquid diet. Holding semaglutide and metformin due to volume depletion. Continue glimepiride 1 mg p.o. daily. CBG monitoring with RI SS.     Hyperlipidemia Continue Lipitor 80 mg p.o. daily.    Essential hypertension Hold Hyzaar given earlier volume depletion. Continue metoprolol 50 mg p.o. twice daily. Continue close blood pressure monitoring.    GERD Continue Protonix 40 mg p.o. twice daily.    OSA on CPAP CPAP at bedtime.    Anxiety and depression Continue alprazolam 1 mg p.o. at bedtime. Continue Effexor XR 150 mg p.o. daily. Continue nortriptyline 10 mg p.o. bedtime.     Advance Care Planning:   Code Status: Full Code   Consults:   Family Communication:   Severity of Illness: The appropriate patient status for this patient is OBSERVATION. Observation status is judged to be reasonable and necessary in order to provide the required intensity of service to ensure the patient's safety. The patient's presenting symptoms, physical exam findings, and initial radiographic and laboratory data in the context of their medical condition is felt to place them at decreased risk for further clinical deterioration. Furthermore, it is anticipated that the patient will be medically stable for discharge from the hospital within 2 midnights of admission.   Author: Reubin Milan, MD 09/22/2022 3:08 PM  For on call review [email or web address].   This document was prepared using Dragon voice recognition software and may contain some unintended transcription errors.

## 2022-09-22 NOTE — ED Notes (Signed)
Pt lying in bed very lethargic. Pt weak and does speak but her sentences are running together. Pt states that she is very tired.

## 2022-09-22 NOTE — Telephone Encounter (Signed)
Noted  

## 2022-09-22 NOTE — ED Notes (Signed)
CBG 53, crackers and orange juice given, will recheck in 20 minutes.

## 2022-09-22 NOTE — ED Notes (Signed)
Called care Link for Consult talked to Burt at [employer]

## 2022-09-22 NOTE — ED Provider Notes (Signed)
[place] [facility] [place] [facility] Provider Note   CSN: [id number] Arrival date & time: 09/22/22  [id number]     History  Chief Complaint  Patient presents with   Diarrhea   Dehydration    Morgan [name] is a 57 y.o. female past medical history of asthma, diabetes, hyperlipidemia, hypertension, ovarian cancer presents today for evaluation of diarrhea, vomiting, weakness.  Patient has had diarrhea for 7 days.  She started to have vomiting since Friday.  She denies any fever.  Endorses nausea and vomiting, unable to keep p.o. intake down.  She endorses runny nose and low back pain.  No known sick contact.  Denies any blood in her stool or urine.   Diarrhea   Past Medical History:  Diagnosis Date   Anal fissure    saw Dr. Mercy Riding    Anxiety    on meds   Arthritis    on meds   Asthma    uses inhaler if needed   Carpal tunnel syndrome    Depression    on meds   Diabetes mellitus    on meds   GERD (gastroesophageal reflux disease)    on meds   History of ovarian cancer in adulthood    Hyperlipidemia    on meds   Hypertension    on meds   Ovarian cancer [facility]) [place]   Skin cancer (melanoma) ([place]) 1997   Sleep apnea    uses CPAP    Tubular adenoma of colon 01/2014   Past Surgical History:  Procedure Laterality Date   ABDOMINAL HYSTERECTOMY  06/30/1984   BSO   BASAL CELL CARCINOMA EXCISION  10/09/2012   lower lip per Dr. Link Snuffer    COLONOSCOPY  03/23/2019   per Dr. Havery Moros, adenomatous polyps, repeat in 3 yrs   GLBS tumor ovary 1986     [place] Right 2007   MELANOMA EXCISION  07/01/1995   upper chest      Home Medications Prior to Admission medications   Medication Sig Start Date End Date Taking? Authorizing Provider  albuterol (VENTOLIN HFA) 108 (90 Base) MCG/ACT inhaler Inhale 2 puffs into the lungs every 6 (six) hours as needed for wheezing or shortness of breath. 01/08/21    Chesley Mires, MD  ALPRAZolam Duanne Moron) 1 MG tablet Take 1 tablet (1 mg total) by mouth at bedtime as needed for sleep. 07/21/22   Laurey Morale, MD  atorvastatin (LIPITOR) 80 MG tablet Take 1 tablet (80 mg total) by mouth daily. 06/16/22   Laurey Morale, MD  glimepiride (AMARYL) 1 MG tablet Take 1 tablet (1 mg total) by mouth daily with breakfast. 08/11/22   Shamleffer, Melanie Crazier, MD  losartan-hydrochlorothiazide (HYZAAR) 50-12.5 MG tablet Take 1 tablet by mouth daily. 09/12/22   Laurey Morale, MD  metFORMIN (GLUCOPHAGE-XR) 500 MG 24 hr tablet Take 1 tablet (500 mg total) by mouth in the morning and at bedtime. 04/08/22   Shamleffer, Melanie Crazier, MD  metoprolol tartrate (LOPRESSOR) 50 MG tablet Take 1 tablet (50 mg total) by mouth 2 (two) times daily. 05/26/22   Laurey Morale, MD  nortriptyline (PAMELOR) 10 MG capsule Take 1 capsule (10 mg total) by mouth at bedtime. 08/29/22   Laurey Morale, MD  omeprazole (PRILOSEC) 40 MG capsule Take 1 capsule (40 mg total) by mouth 2 (two) times daily. 07/18/22   Levin Erp, PA  Semaglutide [facility]) [phone]  MG TABS Take 1 tablet (14 mg total) by mouth daily. 04/08/22   Shamleffer, Melanie Crazier, MD  venlafaxine XR (EFFEXOR-XR) 150 MG 24 hr capsule Take 1 (one) tablet by mouth daily. 03/27/22   Laurey Morale, MD      Allergies    Lisinopril    Review of Systems   Review of Systems  Gastrointestinal:  Positive for diarrhea.    Physical Exam Updated Vital Signs BP (!) 110/54   Pulse 66   Temp 97.9 F (36.6 C)   Resp 12   Ht 5\' 4"  (1.626 m)   Wt 95.7 kg   LMP 03/22/1986   SpO2 97%   BMI 36.21 kg/m  Physical Exam Vitals and nursing note reviewed.  Constitutional:      Appearance: She is ill-appearing.  HENT:     Head: Normocephalic and atraumatic.     Mouth/Throat:     Mouth: Mucous membranes are moist.  Eyes:     General: No scleral icterus. Cardiovascular:     Rate and Rhythm: Normal rate and regular rhythm.      Pulses: Normal pulses.     Heart sounds: Normal heart sounds.  Pulmonary:     Effort: Pulmonary effort is normal.     Breath sounds: Normal breath sounds.  Abdominal:     General: Abdomen is flat.     Palpations: Abdomen is soft.     Tenderness: There is no abdominal tenderness.  Musculoskeletal:        General: No deformity.  Skin:    General: Skin is warm.     Findings: No rash.  Neurological:     General: No focal deficit present.     Mental Status: She is alert.  Psychiatric:        Mood and Affect: Mood normal.     ED Results / Procedures / Treatments   Labs (all labs ordered are listed, but only abnormal results are displayed) Labs Reviewed  CBC WITH DIFFERENTIAL/PLATELET - Abnormal; Notable for the following components:      Result Value   Monocytes Absolute 1.2 (*)    Abs Immature Granulocytes 0.28 (*)    All other components within normal limits  COMPREHENSIVE METABOLIC PANEL - Abnormal; Notable for the following components:   Potassium 2.6 (*)    Glucose, Bld 133 (*)    Calcium 8.3 (*)    All other components within normal limits  LACTIC ACID, PLASMA - Abnormal; Notable for the following components:   Lactic Acid, Venous 2.0 (*)    All other components within normal limits  CBG MONITORING, ED - Abnormal; Notable for the following components:   Glucose-Capillary 141 (*)    All other components within normal limits  I-STAT VENOUS BLOOD GAS, ED - Abnormal; Notable for the following components:   pCO2, Ven 43.2 (*)    Potassium 2.6 (*)    Calcium, Ion 1.08 (*)    All other components within normal limits  CBG MONITORING, ED - Abnormal; Notable for the following components:   Glucose-Capillary 53 (*)    All other components within normal limits  RESP PANEL BY RT-PCR (RSV, FLU A&B, COVID)  RVPGX2  LACTIC ACID, PLASMA  URINALYSIS, ROUTINE W REFLEX MICROSCOPIC  MAGNESIUM  PHOSPHORUS  CBG MONITORING, ED    EKG EKG Interpretation  Date/Time:  Monday September 22 2022 10:14:57 EDT Ventricular Rate:  66 PR Interval:  161 QRS Duration: 109 QT Interval:  424 QTC Calculation: 445 R Axis:  52 Text Interpretation: Sinus rhythm Low voltage, precordial leads Borderline T abnormalities, anterior leads Confirmed by Nanda Quinton [phone]) on 09/22/2022 11:30:07 AM  Radiology CT ABDOMEN PELVIS W CONTRAST  Result Date: 09/22/2022 CLINICAL DATA:  Abdominal pain and diarrhea. EXAM: CT ABDOMEN AND PELVIS WITH CONTRAST TECHNIQUE: Multidetector CT imaging of the abdomen and pelvis was performed using the standard protocol following bolus administration of intravenous contrast. RADIATION DOSE REDUCTION: This exam was performed according to the departmental dose-optimization program which includes automated exposure control, adjustment of the mA and/or kV according to patient size and/or use of iterative reconstruction technique. CONTRAST:  171mL OMNIPAQUE IOHEXOL 300 MG/ML  SOLN COMPARISON:  January 08, 2010 FINDINGS: Lower chest: Atelectasis versus chronic interstitial changes in the dependent portions of the bilateral lower lobes. Hepatobiliary: No focal liver abnormality is seen. No gallstones, gallbladder wall thickening, or biliary dilatation. Pancreas: Unremarkable. No pancreatic ductal dilatation or surrounding inflammatory changes. Spleen: Normal in size without focal abnormality. Adrenals/Urinary Tract: Adrenal glands are unremarkable. Kidneys are normal, without renal calculi, focal lesion, or hydronephrosis. Bladder is unremarkable. Stomach/Bowel: Normal stomach and small bowel. Normal caliber of the colon. Minimal mucosal thickening at the rectosigmoid junction, nonspecific. Vascular/Lymphatic: No evidence of lymphadenopathy. No evidence of aortic atherosclerosis. Numerous postsurgical clips in the retroperitoneum and the mesentery. Reproductive: Status post hysterectomy. No adnexal masses. Other: No abdominal wall hernia or abnormality. No abdominopelvic ascites.  Musculoskeletal: No acute or significant osseous findings. Post total right hip arthroplasty. IMPRESSION: 1. No acute abnormalities within the abdominal organs. 2. Minimal mucosal thickening at the rectosigmoid junction, nonspecific. 3. Atelectasis versus chronic interstitial changes in the dependent portions of the bilateral lower lobes. Electronically Signed   By: Fidela Salisbury M.D.   On: 09/22/2022 11:50    Procedures Procedures    Medications Ordered in ED Medications  potassium chloride 10 mEq in 100 mL IVPB (10 mEq Intravenous New Bag/Given 09/22/22 1235)  0.9 %  sodium chloride infusion ( Intravenous New Bag/Given 09/22/22 1051)  sodium chloride 0.9 % bolus 1,000 mL (0 mLs Intravenous Stopped 09/22/22 1122)  iohexol (OMNIPAQUE) 300 MG/ML solution 100 mL (100 mLs Intravenous Contrast Given 09/22/22 1121)  ondansetron (ZOFRAN) injection 4 mg (4 mg Intravenous Given 09/22/22 1219)    ED Course/ Medical Decision Making/ A&P                             Medical Decision Making Amount and/or Complexity of Data Reviewed Labs: ordered. Radiology: ordered.  Risk Prescription drug management.   This patient presents to the ED for diarrhea, vomiting, this involves an extensive number of treatment options, and is a complaint that carries with a high risk of complications and morbidity.  The differential diagnosis includes viral gastritis, gastroenteritis, enteritis, COVID, flu, RSV, diverticulitis, appendicitis, infectious etiology.  This is not an exhaustive list.  Lab tests: I ordered and personally interpreted labs.  The pertinent results include: WBC unremarkable. Hbg unremarkable. Platelets unremarkable. Electrolytes significant for potassium 2.6. BUN, creatinine unremarkable.  Lactic acid 2.  Imaging studies: I ordered imaging studies. I personally reviewed, interpreted imaging and agree with the radiologist's interpretations. The results include: CT abdomen pelvis showed  nonspecific mucosal thickening of the rectosigmoid junction.  Problem list/ ED course/ Critical interventions/ Medical management: HPI: See above Vital signs within normal range and stable throughout visit. Laboratory/imaging studies significant for: See above. On physical examination, patient is afebrile and appears in no acute distress. This patient  presents with non bloody diarrhea consistent with likely viral enteritis. Doubt invasive bacteria causing diarrhea such as C diff (no recent antibiotics), shiga toxin (non bloody). No recent travel. Patient is not immunocompromised. Diarrhea is non bloody so less likely inflammatory bowel disease. Given history, I have low suspicion for giardia or other parasites. Considered, but think unlikely, partial SBO, appendicitis, diverticulitis, other intraabdominal infection. Low suspicion for secondary causes of diarrhea such as hyperadrenergic state, pheo, adrenal crisis, thyrotoxicosis, or sepsis. CMP with potassium of 2.6 and lactic 2.  Potassium ordered.  2 L normal saline given.  Zofran ordered.  I do think patient require admission for IV fluid therapy and potassium repletion.  I have reviewed the patient home medicines and have made adjustments as needed.  Cardiac monitoring/EKG: The patient was maintained on a cardiac monitor.  I personally reviewed and interpreted the cardiac monitor which showed an underlying rhythm of: sinus rhythm.  Additional history obtained: External records from outside source obtained and reviewed including: Chart review including previous notes, labs, imaging.  Consultations obtained: I requested consultation with Dr. Olevia Bowens and discussed lab and imaging findings as well as pertinent plan.  He recommended admission.  Disposition Patient is admitted to the hospital. This chart was dictated using voice recognition software.  Despite best efforts to proofread,  errors can occur which can change the documentation meaning.           Final Clinical Impression(s) / ED Diagnoses Final diagnoses:  Diarrhea, unspecified type  Hypokalemia  Dehydration    Rx / DC Orders ED Discharge Orders     None         Rex Kras, [place] 09/22/22 1243    Long, Wonda Olds, MD 09/25/22 1600

## 2022-09-22 NOTE — Inpatient Diabetes Management (Signed)
Inpatient Diabetes Program Recommendations  AACE/ADA: New Consensus Statement on Inpatient Glycemic Control (2015)  Target Ranges:  Prepandial:   less than 140 mg/dL      Peak postprandial:   less than 180 mg/dL (1-2 hours)      Critically ill patients:  140 - 180 mg/dL   Lab Results  Component Value Date   GLUCAP 97 09/22/2022   HGBA1C 8.1 (A) 08/11/2022    Review of Glycemic Control  Latest Reference Range & Units 09/22/22 10:00 09/22/22 12:08 09/22/22 12:35  Glucose-Capillary 70 - 99 mg/dL 141 (H) 53 (L) 97  (H): Data is abnormally high (L): Data is abnormally low Diabetes history: Type 2 DM Outpatient Diabetes medications: Amaryl 1 mg QD, Rybelsus 14 mg QD, Metformin 500 mg BID Current orders for Inpatient glycemic control: none  Inpatient Diabetes Program Recommendations:    Noted hypoglycemia of 53 mg/dL with no insulin received, hypokalemia and poor oral intake. Consider adding CBGs Q4H.   Thanks, Bronson Curb, MSN, RNC-OB Diabetes Coordinator [phone] (8a-5p)

## 2022-09-23 ENCOUNTER — Ambulatory Visit: Payer: BC Managed Care – PPO | Admitting: Family Medicine

## 2022-09-23 ENCOUNTER — Telehealth: Payer: Self-pay | Admitting: Physician Assistant

## 2022-09-23 ENCOUNTER — Encounter: Payer: Self-pay | Admitting: Internal Medicine

## 2022-09-23 DIAGNOSIS — R197 Diarrhea, unspecified: Secondary | ICD-10-CM | POA: Diagnosis not present

## 2022-09-23 DIAGNOSIS — E876 Hypokalemia: Secondary | ICD-10-CM | POA: Diagnosis not present

## 2022-09-23 DIAGNOSIS — E860 Dehydration: Secondary | ICD-10-CM | POA: Diagnosis not present

## 2022-09-23 LAB — CBC
HCT: 33.6 % — ABNORMAL LOW (ref 36.0–46.0)
Hemoglobin: 10.7 g/dL — ABNORMAL LOW (ref 12.0–15.0)
MCH: 29.9 pg (ref 26.0–34.0)
MCHC: 31.8 g/dL (ref 30.0–36.0)
MCV: 93.9 fL (ref 80.0–100.0)
Platelets: 195 10*3/uL (ref 150–400)
RBC: 3.58 MIL/uL — ABNORMAL LOW (ref 3.87–5.11)
RDW: 15 % (ref 11.5–15.5)
WBC: 5.8 10*3/uL (ref 4.0–10.5)
nRBC: 0 % (ref 0.0–0.2)

## 2022-09-23 LAB — C DIFFICILE QUICK SCREEN W PCR REFLEX
C Diff antigen: NEGATIVE
C Diff interpretation: NOT DETECTED
C Diff toxin: NEGATIVE

## 2022-09-23 LAB — GASTROINTESTINAL PANEL BY PCR, STOOL (REPLACES STOOL CULTURE)
Adenovirus F40/41: DETECTED — AB
Astrovirus: NOT DETECTED
Campylobacter species: DETECTED — AB
Cryptosporidium: NOT DETECTED
Cyclospora cayetanensis: NOT DETECTED
Entamoeba histolytica: NOT DETECTED
Enteroaggregative E coli (EAEC): NOT DETECTED
Enteropathogenic E coli (EPEC): NOT DETECTED
Enterotoxigenic E coli (ETEC): NOT DETECTED
Giardia lamblia: NOT DETECTED
Norovirus GI/GII: NOT DETECTED
Plesimonas shigelloides: NOT DETECTED
Rotavirus A: NOT DETECTED
Salmonella species: NOT DETECTED
Sapovirus (I, II, IV, and V): NOT DETECTED
Shiga like toxin producing E coli (STEC): NOT DETECTED
Shigella/Enteroinvasive E coli (EIEC): NOT DETECTED
Vibrio cholerae: NOT DETECTED
Vibrio species: NOT DETECTED
Yersinia enterocolitica: NOT DETECTED

## 2022-09-23 LAB — COMPREHENSIVE METABOLIC PANEL
ALT: 25 U/L (ref 0–44)
AST: 19 U/L (ref 15–41)
Albumin: 3.4 g/dL — ABNORMAL LOW (ref 3.5–5.0)
Alkaline Phosphatase: 45 U/L (ref 38–126)
Anion gap: 6 (ref 5–15)
BUN: 11 mg/dL (ref 6–20)
CO2: 26 mmol/L (ref 22–32)
Calcium: 7.9 mg/dL — ABNORMAL LOW (ref 8.9–10.3)
Chloride: 108 mmol/L (ref 98–111)
Creatinine, Ser: 0.67 mg/dL (ref 0.44–1.00)
GFR, Estimated: >60 mL/min (ref >60)
Glucose, Bld: 96 mg/dL (ref 70–99)
Potassium: 3.4 mmol/L — ABNORMAL LOW (ref 3.5–5.1)
Sodium: 140 mmol/L (ref 135–145)
Total Bilirubin: 0.6 mg/dL (ref 0.3–1.2)
Total Protein: 5.8 g/dL — ABNORMAL LOW (ref 6.5–8.1)

## 2022-09-23 LAB — GLUCOSE, CAPILLARY
Glucose-Capillary: 85 mg/dL (ref 70–99)
Glucose-Capillary: 136 mg/dL — ABNORMAL HIGH (ref 70–99)
Glucose-Capillary: 75 mg/dL (ref 70–99)
Glucose-Capillary: 165 mg/dL — ABNORMAL HIGH (ref 70–99)

## 2022-09-23 LAB — HIV ANTIBODY (ROUTINE TESTING W REFLEX): HIV Screen 4th Generation wRfx: NONREACTIVE

## 2022-09-23 MED ORDER — METOPROLOL TARTRATE 25 MG PO TABS
25.0000 mg | ORAL_TABLET | Freq: Two times a day (BID) | ORAL | Status: DC
  Administered 2022-09-23 – 2022-09-25 (×4): 25 mg via ORAL
  Filled 2022-09-23 (×4): qty 1

## 2022-09-23 MED ORDER — AZITHROMYCIN 250 MG PO TABS
500.0000 mg | ORAL_TABLET | Freq: Every day | ORAL | Status: AC
  Administered 2022-09-23 – 2022-09-25 (×3): 500 mg via ORAL
  Filled 2022-09-23 (×3): qty 2

## 2022-09-23 MED ORDER — INSULIN ASPART 100 UNIT/ML IJ SOLN: 0.0000 [IU] | Freq: Three times a day (TID) | INTRAMUSCULAR | Status: DC

## 2022-09-23 MED ORDER — LACTATED RINGERS IV SOLN: INTRAVENOUS | Status: AC

## 2022-09-23 MED ORDER — POTASSIUM CHLORIDE CRYS ER 20 MEQ PO TBCR
20.0000 meq | EXTENDED_RELEASE_TABLET | Freq: Once | ORAL | Status: AC
  Administered 2022-09-23: 20 meq via ORAL
  Filled 2022-09-23: qty 1

## 2022-09-23 NOTE — Telephone Encounter (Signed)
Dr. Havery Moros, patient sent a mychart message .  She is currently admitted in [facility].  Please review . Secure chat sent as well

## 2022-09-23 NOTE — Progress Notes (Signed)
  Transition of Care [facility]) Screening Note   Patient Details  Name: Morgan [name] Date of Birth: February 20, 1966   Transition of Care [facility]) CM/SW Contact:    Vassie Moselle, LCSW Phone Number: 09/23/2022, 11:15 AM    Transition of Care Department [facility]) has reviewed patient and no TOC needs have been identified at this time. We will continue to monitor patient advancement through interdisciplinary progression rounds. If new patient transition needs arise, please place a TOC consult.

## 2022-09-23 NOTE — Progress Notes (Addendum)
[facility]                                                                              Morgan [name], is a 57 y.o. female, DOB - 1966/03/05, [id number] Admit date - 09/22/2022    Outpatient Primary MD for the patient is Laurey Morale, MD  LOS - 0  days  Chief Complaint  Patient presents with   Diarrhea   Dehydration       Brief summary   Patient is a 57 year old female with anxiety, osteoarthritis, asthma, diabetes mellitus type 2, GERD, history of ovarian CA, HLP, HTN, OSA on CPAP presented to ED with multiple daily episodes of diarrhea for week.  Patient also had colicky abdominal pain, fatigue, 3 episodes of vomiting since Friday for last 3 days and feels dehydrated.  No recent antibiotics.  Per patient, she had meal at [facility] on Saturday 3/23 however diarrhea had started before.  Also reports low back pain.  No hematochezia or melena or hematemesis. In ED, K severely low 2.6, ionized calcium 1.08, lactic acidosis 2.0 CT abdomen showed no acute abnormalities in the abdominal organs, minimal mucosal thickening at the rectosigmoid junction, nonspecific.   Assessment & Plan    Principal Problem:   Hypokalemia - K severely low 2.6 on admission likely due to diarrhea, -Replace aggressively, potassium 3.4, replaced  Active Problems:   Acute gastroenteritis -Unclear etiology, CT abdomen showed no acute abnormalities however has minimal mucosal thickening at the rectosigmoid junction -Follow stool studies, GI pathogen panel, C. Difficile -Tolerating clear liquid diet, advance diet to soft solids -Continue IV fluid hydration, antiemetics as needed GI pathogen panel showed Campylobacter and adenovirus -Will start patient on a Zithromax 500 mg daily p.o. for 3 days.  No Imodium or loperamide    Type 2 diabetes mellitus with diabetic polyneuropathy, without long-term current use of insulin (HCC) -Continue sliding scale insulin while inpatient, continue  Amaryl -Hold metformin, semaglutide CBG (last 3)  Recent Labs    09/22/22 1506 09/22/22 2138 09/23/22 0744  GLUCAP 101* 86 85       Hyperlipidemia -Continue statin    Essential hypertension -BP soft, hold losartan HCTZ -Currently on Lopressor 50 mg twice daily, will decrease to 25 mg twice daily     GERD -Continue PPI    OSA on CPAP    Anxiety and depression Continue venlafaxine, Pamelor   Obesity Estimated body mass index is 36.21 kg/m as calculated from the following:   Height as of this encounter: 5\' 4"  (1.626 m).   Weight as of this encounter: 95.7 kg.  Code Status: Full CODE STATUS DVT Prophylaxis:     Level of Care: Level of care: Telemetry Family Communication: Updated patient Disposition Plan:      Remains inpatient appropriate: If improving, will DC home in a.m.   Procedures:    Consultants:   None  Antimicrobials: None    Medications  atorvastatin  80 mg Oral QHS   enoxaparin (LOVENOX) injection  50 mg Subcutaneous Q24H   glimepiride  1 mg Oral Q breakfast   insulin aspart  0-15 Units Subcutaneous TID WC  metoprolol tartrate  50 mg Oral BID   nortriptyline  10 mg Oral QHS   pantoprazole  40 mg Oral BID   venlafaxine XR  150 mg Oral Q breakfast      Subjective:   Morgan [name] was seen and examined today.  Having back pain but no acute nausea vomiting bleeding or abdominal pain during exam.  No fevers, BP somewhat soft.    Objective:   Vitals:   09/22/22 1939 09/22/22 2215 09/22/22 2301 09/23/22 0303  BP: 113/86  98/69 96/66  Pulse: 71 68 66 63  Resp: 17 17 18 18   Temp: 98 F (36.7 C)  98.5 F (36.9 C) 98.8 F (37.1 C)  TempSrc: Oral     SpO2: 97% 95% 96% 98%  Weight:      Height:        Intake/Output Summary (Last 24 hours) at 09/23/2022 1046 Last data filed at 09/23/2022 0909 Gross per 24 hour  Intake 3888.5 ml  Output --  Net 3888.5 ml     Wt Readings from Last 3 Encounters:  09/22/22 95.7 kg  08/11/22  95.7 kg  07/18/22 97.2 kg     Exam General: Alert and oriented x 3, NAD Cardiovascular: S1 S2 auscultated,  RRR Respiratory: Clear to auscultation bilaterally Gastrointestinal: Soft, nontender, nondistended, + bowel sounds Ext: no pedal edema bilaterally Neuro: no new deficits Psych: Normal affect     Data Reviewed:  I have personally reviewed following labs    CBC Lab Results  Component Value Date   WBC 5.8 09/23/2022   RBC 3.58 (L) 09/23/2022   HGB 10.7 (L) 09/23/2022   HCT 33.6 (L) 09/23/2022   MCV 93.9 09/23/2022   MCH 29.9 09/23/2022   PLT 195 09/23/2022   MCHC 31.8 09/23/2022   RDW 15.0 09/23/2022   LYMPHSABS 2.1 09/22/2022   MONOABS 1.2 (H) 09/22/2022   EOSABS 0.1 09/22/2022   BASOSABS 0.0 [id number]     Last metabolic panel Lab Results  Component Value Date   NA 140 09/23/2022   K 3.4 (L) 09/23/2022   CL 108 09/23/2022   CO2 26 09/23/2022   BUN 11 09/23/2022   CREATININE 0.67 09/23/2022   GLUCOSE 96 09/23/2022   GFRNONAA >60 09/23/2022   GFRAA >60 10/17/2019   CALCIUM 7.9 (L) 09/23/2022   PHOS 2.8 09/22/2022   PROT 5.8 (L) 09/23/2022   ALBUMIN 3.4 (L) 09/23/2022   BILITOT 0.6 09/23/2022   ALKPHOS 45 09/23/2022   AST 19 09/23/2022   ALT 25 09/23/2022   ANIONGAP 6 09/23/2022    CBG (last 3)  Recent Labs    09/22/22 1506 09/22/22 2138 09/23/22 0744  GLUCAP 101* 86 85      Coagulation Profile: No results for input(s): "INR", "PROTIME" in the last 168 hours.   Radiology Studies: I have personally reviewed the imaging studies  CT ABDOMEN PELVIS W CONTRAST  Result Date: 09/22/2022 CLINICAL DATA:  Abdominal pain and diarrhea. EXAM: CT ABDOMEN AND PELVIS WITH CONTRAST TECHNIQUE: Multidetector CT imaging of the abdomen and pelvis was performed using the standard protocol following bolus administration of intravenous contrast. RADIATION DOSE REDUCTION: This exam was performed according to the departmental dose-optimization program which  includes automated exposure control, adjustment of the mA and/or kV according to patient size and/or use of iterative reconstruction technique. CONTRAST:  153mL OMNIPAQUE IOHEXOL 300 MG/ML  SOLN COMPARISON:  January 08, 2010 FINDINGS: Lower chest: Atelectasis versus chronic interstitial changes in the dependent portions of the bilateral  lower lobes. Hepatobiliary: No focal liver abnormality is seen. No gallstones, gallbladder wall thickening, or biliary dilatation. Pancreas: Unremarkable. No pancreatic ductal dilatation or surrounding inflammatory changes. Spleen: Normal in size without focal abnormality. Adrenals/Urinary Tract: Adrenal glands are unremarkable. Kidneys are normal, without renal calculi, focal lesion, or hydronephrosis. Bladder is unremarkable. Stomach/Bowel: Normal stomach and small bowel. Normal caliber of the colon. Minimal mucosal thickening at the rectosigmoid junction, nonspecific. Vascular/Lymphatic: No evidence of lymphadenopathy. No evidence of aortic atherosclerosis. Numerous postsurgical clips in the retroperitoneum and the mesentery. Reproductive: Status post hysterectomy. No adnexal masses. Other: No abdominal wall hernia or abnormality. No abdominopelvic ascites. Musculoskeletal: No acute or significant osseous findings. Post total right hip arthroplasty. IMPRESSION: 1. No acute abnormalities within the abdominal organs. 2. Minimal mucosal thickening at the rectosigmoid junction, nonspecific. 3. Atelectasis versus chronic interstitial changes in the dependent portions of the bilateral lower lobes. Electronically Signed   By: Fidela Salisbury M.D.   On: 09/22/2022 11:50       Tomoki Lucken M.D. [facility] 09/23/2022, 10:46 AM  Available via Epic secure chat 7am-7pm After 7 pm, please refer to night coverage provider listed on amion.

## 2022-09-23 NOTE — Progress Notes (Signed)
Dr. Tana Coast notified that the patient is upset that she is still having diarrhea and abdominal pain. I gave pain meds and explained that the antibiotics and IV fluids need time to work. Elta Guadeloupe (the patient's disability advocate) called to check on all of these complaints and I explained all of this to him as well. [place] gave me permission to talk to him and he faxed a signed consent over. Will continue to monitor patient.

## 2022-09-23 NOTE — Telephone Encounter (Signed)
Inbound call from patient requesting to speak with a nurse asap in regards message she sent through [place]..She is currently at the hospital , a good call back number in case she can't be reach at her cell phone is [phone]

## 2022-09-23 NOTE — Progress Notes (Signed)
Dr. Tana Coast notified about GI panel results and that patient is refusing to take her insulin.

## 2022-09-24 DIAGNOSIS — E860 Dehydration: Secondary | ICD-10-CM | POA: Diagnosis present

## 2022-09-24 DIAGNOSIS — Z85828 Personal history of other malignant neoplasm of skin: Secondary | ICD-10-CM | POA: Diagnosis not present

## 2022-09-24 DIAGNOSIS — Z7984 Long term (current) use of oral hypoglycemic drugs: Secondary | ICD-10-CM | POA: Diagnosis not present

## 2022-09-24 DIAGNOSIS — G8929 Other chronic pain: Secondary | ICD-10-CM | POA: Diagnosis present

## 2022-09-24 DIAGNOSIS — E785 Hyperlipidemia, unspecified: Secondary | ICD-10-CM | POA: Diagnosis present

## 2022-09-24 DIAGNOSIS — F32A Depression, unspecified: Secondary | ICD-10-CM | POA: Diagnosis present

## 2022-09-24 DIAGNOSIS — E669 Obesity, unspecified: Secondary | ICD-10-CM | POA: Diagnosis present

## 2022-09-24 DIAGNOSIS — R197 Diarrhea, unspecified: Secondary | ICD-10-CM | POA: Diagnosis present

## 2022-09-24 DIAGNOSIS — A045 Campylobacter enteritis: Secondary | ICD-10-CM | POA: Diagnosis present

## 2022-09-24 DIAGNOSIS — E872 Acidosis, unspecified: Secondary | ICD-10-CM | POA: Diagnosis present

## 2022-09-24 DIAGNOSIS — I959 Hypotension, unspecified: Secondary | ICD-10-CM | POA: Diagnosis present

## 2022-09-24 DIAGNOSIS — E11649 Type 2 diabetes mellitus with hypoglycemia without coma: Secondary | ICD-10-CM | POA: Diagnosis present

## 2022-09-24 DIAGNOSIS — J4489 Other specified chronic obstructive pulmonary disease: Secondary | ICD-10-CM | POA: Diagnosis present

## 2022-09-24 DIAGNOSIS — R296 Repeated falls: Secondary | ICD-10-CM | POA: Diagnosis present

## 2022-09-24 DIAGNOSIS — Z79899 Other long term (current) drug therapy: Secondary | ICD-10-CM | POA: Diagnosis not present

## 2022-09-24 DIAGNOSIS — Z8543 Personal history of malignant neoplasm of ovary: Secondary | ICD-10-CM | POA: Diagnosis not present

## 2022-09-24 DIAGNOSIS — E1142 Type 2 diabetes mellitus with diabetic polyneuropathy: Secondary | ICD-10-CM | POA: Diagnosis present

## 2022-09-24 DIAGNOSIS — Z1152 Encounter for screening for COVID-19: Secondary | ICD-10-CM | POA: Diagnosis not present

## 2022-09-24 DIAGNOSIS — G4733 Obstructive sleep apnea (adult) (pediatric): Secondary | ICD-10-CM | POA: Diagnosis present

## 2022-09-24 DIAGNOSIS — I10 Essential (primary) hypertension: Secondary | ICD-10-CM | POA: Diagnosis present

## 2022-09-24 DIAGNOSIS — K219 Gastro-esophageal reflux disease without esophagitis: Secondary | ICD-10-CM | POA: Diagnosis present

## 2022-09-24 DIAGNOSIS — E538 Deficiency of other specified B group vitamins: Secondary | ICD-10-CM | POA: Diagnosis present

## 2022-09-24 DIAGNOSIS — E876 Hypokalemia: Secondary | ICD-10-CM | POA: Diagnosis present

## 2022-09-24 DIAGNOSIS — Z85820 Personal history of malignant melanoma of skin: Secondary | ICD-10-CM | POA: Diagnosis not present

## 2022-09-24 DIAGNOSIS — F419 Anxiety disorder, unspecified: Secondary | ICD-10-CM | POA: Diagnosis present

## 2022-09-24 LAB — BASIC METABOLIC PANEL WITH GFR
Anion gap: 6 (ref 5–15)
BUN: 11 mg/dL (ref 6–20)
CO2: 27 mmol/L (ref 22–32)
Calcium: 8.7 mg/dL — ABNORMAL LOW (ref 8.9–10.3)
Chloride: 108 mmol/L (ref 98–111)
Creatinine, Ser: 0.59 mg/dL (ref 0.44–1.00)
GFR, Estimated: >60 mL/min
Glucose, Bld: 95 mg/dL (ref 70–99)
Potassium: 3.5 mmol/L (ref 3.5–5.1)
Sodium: 141 mmol/L (ref 135–145)

## 2022-09-24 LAB — CBC
HCT: 32.7 % — ABNORMAL LOW (ref 36.0–46.0)
Hemoglobin: 10.6 g/dL — ABNORMAL LOW (ref 12.0–15.0)
MCH: 30.4 pg (ref 26.0–34.0)
MCHC: 32.4 g/dL (ref 30.0–36.0)
MCV: 93.7 fL (ref 80.0–100.0)
Platelets: 205 K/uL (ref 150–400)
RBC: 3.49 MIL/uL — ABNORMAL LOW (ref 3.87–5.11)
RDW: 14.7 % (ref 11.5–15.5)
WBC: 5.1 K/uL (ref 4.0–10.5)
nRBC: 0 % (ref 0.0–0.2)

## 2022-09-24 LAB — GLUCOSE, CAPILLARY
Glucose-Capillary: 85 mg/dL (ref 70–99)
Glucose-Capillary: 116 mg/dL — ABNORMAL HIGH (ref 70–99)
Glucose-Capillary: 92 mg/dL (ref 70–99)
Glucose-Capillary: 100 mg/dL — ABNORMAL HIGH (ref 70–99)

## 2022-09-24 MED ORDER — LACTATED RINGERS IV SOLN: INTRAVENOUS | Status: DC

## 2022-09-24 MED ORDER — METFORMIN HCL ER 500 MG PO TB24
500.0000 mg | ORAL_TABLET | Freq: Two times a day (BID) | ORAL | Status: DC
  Administered 2022-09-24 – 2022-09-25 (×2): 500 mg via ORAL
  Filled 2022-09-24 (×2): qty 1

## 2022-09-24 MED ORDER — SEMAGLUTIDE 14 MG PO TABS: 14.0000 mg | ORAL_TABLET | Freq: Every day | ORAL | Status: DC

## 2022-09-24 NOTE — Progress Notes (Signed)
PROGRESS NOTE   Morgan [name]  [id number] DOB: 18-Jun-1966 DOA: 09/22/2022 PCP: Laurey Morale, MD  Brief Narrative:   57 year old white female community dwelling Status post hip repair 2012 with Dr. Ninfa Linden DM TY 2 on orals Depression HLD, COPD Underlying ovarian cancer[?] External hemorrhoids and constipation at baseline follows with Dr. Alferd Apa deficiency--has had several falls and is followed by neurology Dr. Posey Pronto in the outpatient setting--referred to sports medicine for ongoing upper thigh hip discomfort  Present [facility] [place] 09/22/2022 nausea vomiting diarrhea mild hypoglycemia since 08/2020 started having nausea and emesis unable to keep p.o. down CT abdomen pelvis minimal mucosal thickening rectosigmoid junction nonspecific-lungs = atelectasis versus chronic interstitial changes BP 73/35 status post bolus NS Rx Zofran and KCl Calcium 2.6  GI pathogen panel = Campylobacter/adenovirus-started on Zithromax  Hospital-Problem based course  Hypotension on admission Acute diarrheal illness secondary to likely Campylobacter -Because of severity of diarrheal illness and volume depletion decision made 3/26 to start azithromycin--will complete dosing on 3/28 - Continue LR 75 cc/H -Condition has significantly improved and if she remains pretty stable over the course of the next 24 to 48 hours she will likely can discharge  Depression/?  Chronic pain -Patient complains of right flank pain today and usually takes ibuprofen 600 every morning - CT scan from admission showed no abnormalities and kidney or adrenal to account for patient's localization of pain, Tylenol only for pain going forward in the outpatient setting - Continue Pamelor 10, Effexor or 150, Xanax 1 tab at bedtime as needed sleep - Needs polypharmacy reduction outpatient  DM TY 2 -Continuing Amaryl 1 mg every morning with breakfast, may resume prior to discharge metformin 500 XR, semaglutide  14 mg daily  HLD, metabolic syndrome - Continue atorvastatin 80, metoprolol 25 twice daily  COPD Upper thigh discomfort  DVT prophylaxis: Lovenox Code Status: Full Family Communication: Long discussion with patient's "advocate" and friend Shauna at the bedside Disposition:  Status is: Observation The patient will require care spanning > 2 midnights and should be moved to inpatient because:   Requires further management inpatient with fluids and monitoring creatinine in addition to evaluation of pain   Subjective: Coherent no distress looks comfortable sitting up in bed Events overnight noted  Objective: Vitals:   09/23/22 1415 09/23/22 2109 09/23/22 2116 09/24/22 0522  BP: 114/68  115/71 114/68  Pulse: 70 73 73 65  Resp: 18 16 19 16   Temp: 98.2 F (36.8 C)  97.9 F (36.6 C) 98 F (36.7 C)  TempSrc:    Oral  SpO2: 92% 95% 95% 93%  Weight:      Height:        Intake/Output Summary (Last 24 hours) at 09/24/2022 0821 Last data filed at 09/24/2022 0300 Gross per 24 hour  Intake 2982.72 ml  Output --  Net 2982.72 ml   Filed Weights   09/22/22 0944  Weight: 95.7 kg    Examination:  EOMI NCAT thick neck Mallampati 4 No icterus no pallor Chest is clear no wheeze rales rhonchi Has some point tenderness to the lower posterior ribs on the right side Abdomen is soft no rebound no guarding No lower extremity  ROM intact moving 4 limbs equally  Data Reviewed: personally reviewed   CBC    Component Value Date/Time   WBC 5.1 09/24/2022 0650   RBC 3.49 (L) 09/24/2022 0650   HGB 10.6 (L) 09/24/2022 0650   HCT 32.7 (L) 09/24/2022 0650   PLT 205 09/24/2022  0650   MCV 93.7 09/24/2022 0650   MCH 30.4 09/24/2022 0650   MCHC 32.4 09/24/2022 0650   RDW 14.7 09/24/2022 0650   LYMPHSABS 2.1 09/22/2022 1014   MONOABS 1.2 (H) 09/22/2022 1014   EOSABS 0.1 09/22/2022 1014   BASOSABS 0.0 09/22/2022 1014      Latest Ref Rng & Units 09/24/2022    6:50 AM 09/23/2022    5:38  AM 09/22/2022   10:22 AM  CMP  Glucose 70 - 99 mg/dL 95  96    BUN 6 - 20 mg/dL 11  11    Creatinine 0.44 - 1.00 mg/dL 0.59  0.67    Sodium 135 - 145 mmol/L 141  140  140   Potassium 3.5 - 5.1 mmol/L 3.5  3.4  2.6   Chloride 98 - 111 mmol/L 108  108    CO2 22 - 32 mmol/L 27  26    Calcium 8.9 - 10.3 mg/dL 8.7  7.9    Total Protein 6.5 - 8.1 g/dL  5.8    Total Bilirubin 0.3 - 1.2 mg/dL  0.6    Alkaline Phos 38 - 126 U/L  45    AST 15 - 41 U/L  19    ALT 0 - 44 U/L  25       Radiology Studies: CT ABDOMEN PELVIS W CONTRAST  Result Date: 09/22/2022 CLINICAL DATA:  Abdominal pain and diarrhea. EXAM: CT ABDOMEN AND PELVIS WITH CONTRAST TECHNIQUE: Multidetector CT imaging of the abdomen and pelvis was performed using the standard protocol following bolus administration of intravenous contrast. RADIATION DOSE REDUCTION: This exam was performed according to the departmental dose-optimization program which includes automated exposure control, adjustment of the mA and/or kV according to patient size and/or use of iterative reconstruction technique. CONTRAST:  134mL OMNIPAQUE IOHEXOL 300 MG/ML  SOLN COMPARISON:  January 08, 2010 FINDINGS: Lower chest: Atelectasis versus chronic interstitial changes in the dependent portions of the bilateral lower lobes. Hepatobiliary: No focal liver abnormality is seen. No gallstones, gallbladder wall thickening, or biliary dilatation. Pancreas: Unremarkable. No pancreatic ductal dilatation or surrounding inflammatory changes. Spleen: Normal in size without focal abnormality. Adrenals/Urinary Tract: Adrenal glands are unremarkable. Kidneys are normal, without renal calculi, focal lesion, or hydronephrosis. Bladder is unremarkable. Stomach/Bowel: Normal stomach and small bowel. Normal caliber of the colon. Minimal mucosal thickening at the rectosigmoid junction, nonspecific. Vascular/Lymphatic: No evidence of lymphadenopathy. No evidence of aortic atherosclerosis. Numerous  postsurgical clips in the retroperitoneum and the mesentery. Reproductive: Status post hysterectomy. No adnexal masses. Other: No abdominal wall hernia or abnormality. No abdominopelvic ascites. Musculoskeletal: No acute or significant osseous findings. Post total right hip arthroplasty. IMPRESSION: 1. No acute abnormalities within the abdominal organs. 2. Minimal mucosal thickening at the rectosigmoid junction, nonspecific. 3. Atelectasis versus chronic interstitial changes in the dependent portions of the bilateral lower lobes. Electronically Signed   By: Fidela Salisbury M.D.   On: 09/22/2022 11:50     Scheduled Meds:  atorvastatin  80 mg Oral QHS   azithromycin  500 mg Oral Daily   enoxaparin (LOVENOX) injection  50 mg Subcutaneous Q24H   glimepiride  1 mg Oral Q breakfast   insulin aspart  0-9 Units Subcutaneous TID WC   metoprolol tartrate  25 mg Oral BID   nortriptyline  10 mg Oral QHS   pantoprazole  40 mg Oral BID   venlafaxine XR  150 mg Oral Q breakfast   Continuous Infusions:  lactated ringers 100 mL/hr at 09/23/22  1007     LOS: 0 days   Time spent: [employer], MD [facility] To contact the attending provider between 7A-7P or the covering provider during after hours 7P-7A, please log into the web site [email or web address] and access using universal [place] password for that web site. If you do not have the password, please call the hospital operator.  09/24/2022, 8:21 AM

## 2022-09-24 NOTE — Progress Notes (Signed)
Mobility Specialist - Progress Note   09/24/22 1228  Mobility  Activity Ambulated with assistance in hallway  Level of Assistance Standby assist, set-up cues, supervision of patient - no hands on  Assistive Device None  Distance Ambulated (ft) 500 ft  Activity Response Tolerated well  Mobility Referral Yes  $Mobility charge 1 Mobility   Pt received in bed and agreed to mobility, pt claimed her feet felt asleep when walking. Pt returned to chair with all needs met and family in room.   Roderick Pee Mobility Specialist

## 2022-09-24 NOTE — Plan of Care (Signed)

## 2022-09-24 NOTE — Telephone Encounter (Signed)
I am glad she is getting taken care of

## 2022-09-25 LAB — CBC WITH DIFFERENTIAL/PLATELET
Abs Immature Granulocytes: 0.14 10*3/uL — ABNORMAL HIGH (ref 0.00–0.07)
Basophils Absolute: 0 10*3/uL (ref 0.0–0.1)
Basophils Relative: 0 %
Eosinophils Absolute: 0.1 10*3/uL (ref 0.0–0.5)
Eosinophils Relative: 1 %
HCT: 33.6 % — ABNORMAL LOW (ref 36.0–46.0)
Hemoglobin: 10.9 g/dL — ABNORMAL LOW (ref 12.0–15.0)
Immature Granulocytes: 3 %
Lymphocytes Relative: 44 %
Lymphs Abs: 2.3 10*3/uL (ref 0.7–4.0)
MCH: 29.4 pg (ref 26.0–34.0)
MCHC: 32.4 g/dL (ref 30.0–36.0)
MCV: 90.6 fL (ref 80.0–100.0)
Monocytes Absolute: 0.5 10*3/uL (ref 0.1–1.0)
Monocytes Relative: 9 %
Neutro Abs: 2.2 10*3/uL (ref 1.7–7.7)
Neutrophils Relative %: 43 %
Platelets: 222 10*3/uL (ref 150–400)
RBC: 3.71 MIL/uL — ABNORMAL LOW (ref 3.87–5.11)
RDW: 14.6 % (ref 11.5–15.5)
WBC: 5.2 10*3/uL (ref 4.0–10.5)
nRBC: 0 % (ref 0.0–0.2)

## 2022-09-25 LAB — BASIC METABOLIC PANEL
Anion gap: 7 (ref 5–15)
BUN: 10 mg/dL (ref 6–20)
CO2: 27 mmol/L (ref 22–32)
Calcium: 8.6 mg/dL — ABNORMAL LOW (ref 8.9–10.3)
Chloride: 108 mmol/L (ref 98–111)
Creatinine, Ser: 0.64 mg/dL (ref 0.44–1.00)
GFR, Estimated: >60 mL/min (ref >60)
Glucose, Bld: 100 mg/dL — ABNORMAL HIGH (ref 70–99)
Potassium: 3.2 mmol/L — ABNORMAL LOW (ref 3.5–5.1)
Sodium: 142 mmol/L (ref 135–145)

## 2022-09-25 LAB — GLUCOSE, CAPILLARY: Glucose-Capillary: 105 mg/dL — ABNORMAL HIGH (ref 70–99)

## 2022-09-25 MED ORDER — ACETAMINOPHEN 325 MG PO TABS: 650.0000 mg | ORAL_TABLET | Freq: Four times a day (QID) | ORAL | Status: DC | PRN

## 2022-09-25 MED ORDER — POTASSIUM CHLORIDE CRYS ER 20 MEQ PO TBCR
30.0000 meq | EXTENDED_RELEASE_TABLET | Freq: Once | ORAL | Status: AC
  Administered 2022-09-25: 30 meq via ORAL
  Filled 2022-09-25: qty 1

## 2022-09-25 NOTE — TOC Transition Note (Signed)
Transition of Care [facility]) - CM/SW Discharge Note   Patient Details  Name: Morgan [name] MRN: [id number] Date of Birth: 03/24/1966  Transition of Care [facility]) CM/SW Contact:  Vassie Moselle, LCSW Phone Number: 09/25/2022, 10:27 AM   Clinical Narrative:    Pt's car is currently at [facility]. PT does not have transportation to car. Ride waiver signed and placed in chart. Taxi voucher placed on front of pt's chart. RN notified.    Final next level of care: Home/Self Care Barriers to Discharge: No Barriers Identified   Patient Goals and CMS Choice CMS Medicare.gov Compare Post Acute Care list provided to:: Patient Choice offered to / list presented to : Patient  Discharge Placement                         Discharge Plan and Services Additional resources added to the After Visit Summary for                  DME Arranged: N/A DME Agency: NA                  Social Determinants of Health (SDOH) Interventions SDOH Screenings   Food Insecurity: No Food Insecurity (09/22/2022)  Housing: Low Risk  (09/22/2022)  Transportation Needs: No Transportation Needs (09/22/2022)  Utilities: Not At Risk (09/22/2022)  Depression (PHQ2-9): Low Risk  (07/07/2022)  Tobacco Use: Low Risk  (09/22/2022)     Readmission Risk Interventions     No data to display

## 2022-09-25 NOTE — Discharge Summary (Signed)
Physician Discharge Summary  Morgan [name] [id number] DOB: 1966/05/24 DOA: 09/22/2022  PCP: Laurey Morale, MD  Admit date: 09/22/2022 Discharge date: 09/25/2022  Time spent: 37 minutes  Recommendations for Outpatient Follow-up:  Recommend outpatient Chem-12, CBC Recommend outpatient screening scan if any further back pain--- she probably has lumbar degenerative disc disease and may require multimodal targeted weight loss therapies and core strengthening exercises There is a question of underlying ovarian cancer-we do not have those records-please clarify this with the patient in the outpatient setting Sure that she follows up with sports medicine in the outpatient setting for upper thigh and hip discomfort Warning precautions given to the patient Suggest outpatient polypharmacy reduction as possible given patient is on both nortriptyline and Effexor  Discharge Diagnoses:  MAIN problem for hospitalization   Acute Campylobacter infection with gastroenteritis Severe hypokalemia likely secondary to hypotension in addition to prior to admission antihypertensives lisinopril HCTZ Depression/chronic pain DM TY 2 on oral meds Metabolic syndrome Upper thigh discomfort which is chronic ?  Ovarian CA   Please see below for itemized issues addressed in [facility]- refer to other progress notes for clarity if needed  Discharge Condition: Improved  Diet recommendation: Heart healthy please focus on foods with healthy bacteria such as yogurt    57 year old white female community dwelling Status post hip repair 2012 with Dr. Ninfa Linden DM TY 2 on orals Depression HLD, COPD Underlying ovarian cancer[?] External hemorrhoids and constipation at baseline follows with Dr. Alferd Apa deficiency--has had several falls and is followed by neurology Dr. Posey Pronto in the outpatient setting--referred to sports medicine for ongoing upper thigh hip discomfort   Present [facility] [place] [facility]  09/22/2022 nausea vomiting diarrhea mild hypoglycemia since 08/2020 started having nausea and emesis unable to keep p.o. down CT abdomen pelvis minimal mucosal thickening rectosigmoid junction nonspecific-lungs = atelectasis versus chronic interstitial changes BP 73/35 status post bolus NS Rx Zofran and KCl Calcium 2.6   GI pathogen panel = Campylobacter/adenovirus-started on Zithromax  Hospital Course:  Hypotension on admission Acute diarrheal illness secondary to likely Campylobacter -Because of severity of diarrheal illness and volume depletion decision made 3/26 to start azithromycin--will complete dosing on 3/28 - Continue LR 75 cc/H -Condition has significantly improved and if she remains pretty stable over the course of the next 24 to 48 hours she will likely can discharge   Depression/?  Chronic pain -Patient complains of right flank pain today and usually takes ibuprofen 600 every morning - CT scan from admission showed no abnormalities and kidney or adrenal to account for patient's localization of pain, Tylenol only for pain going forward in the outpatient setting - Continue Pamelor 10, Effexor or 150, Xanax 1 tab at bedtime as needed sleep - Needs polypharmacy reduction outpatient   DM TY 2 -Continuing Amaryl 1 mg every morning with breakfast, may resume prior to discharge metformin 500 XR, semaglutide 14 mg daily   HLD, metabolic syndrome - Continue atorvastatin 80, metoprolol 25 twice daily   COPD Upper thigh discomfort   Discharge Exam: Vitals:   09/24/22 2142 09/25/22 0352  BP:  135/79  Pulse: 63 66  Resp: 16   Temp:  97.8 F (36.6 C)  SpO2: 93% 98%    Subj on day of d/c   Awake coherent pleasant 1 stool today otherwise looks well  General Exam on discharge  EOMI NCAT no focal deficit no icterus no pallor no rales no rhonchi Neck soft supple Abdomen soft no rebound no guarding ROM intact  No lower extremity edema CTAB  Discharge  Instructions   Discharge Instructions     Call MD for:  difficulty breathing, headache or visual disturbances   Complete by: As directed    Call MD for:  hives   Complete by: As directed    Call MD for:  persistant dizziness or light-headedness   Complete by: As directed    Call MD for:  temperature >100.4   Complete by: As directed    Diet - low sodium heart healthy   Complete by: As directed    Discharge instructions   Complete by: As directed    You completed 3 days course of antibiotics for Campylobacter infection-Campylobacter is an invasive bug that sometimes causes severe diarrhea and in your case it seems to resolve pretty quickly with antibiotics We have resumed your usual home meds and I do not think we need to make any changes to them but because your potassium was low when you came in you need labs in a week at your primary care physician office I would also recommend that you get some screening for your lower back pain if it persists-as mentioned to you at time of discharge, your CT scan abdomen pelvis did not show any kidney problems and as you are not having a whole lot of burning in your urine I do not think there is any thing else to be treated Best of luck please present back to the hospital for severe chills fever cough cold and or severe thirst and not passing urine   Increase activity slowly   Complete by: As directed       Allergies as of 09/25/2022       Reactions   Lisinopril Cough        Medication List     STOP taking these medications    ibuprofen 200 MG tablet Commonly known as: ADVIL       TAKE these medications    acetaminophen 325 MG tablet Commonly known as: TYLENOL Take 2 tablets (650 mg total) by mouth every 6 (six) hours as needed for mild pain (or Fever >/= 101).   albuterol 108 (90 Base) MCG/ACT inhaler Commonly known as: VENTOLIN HFA Inhale 2 puffs into the lungs every 6 (six) hours as needed for wheezing or shortness of  breath.   ALPRAZolam 1 MG tablet Commonly known as: XANAX Take 1 tablet (1 mg total) by mouth at bedtime as needed for sleep.   atorvastatin 80 MG tablet Commonly known as: LIPITOR Take 1 tablet (80 mg total) by mouth daily. What changed: when to take this   glimepiride 1 MG tablet Commonly known as: AMARYL Take 1 tablet (1 mg total) by mouth daily with breakfast.   losartan-hydrochlorothiazide 50-12.5 MG tablet Commonly known as: HYZAAR Take 1 tablet by mouth daily. What changed: when to take this   metFORMIN 500 MG 24 hr tablet Commonly known as: GLUCOPHAGE-XR Take 1 tablet (500 mg total) by mouth in the morning and at bedtime.   metoprolol tartrate 50 MG tablet Commonly known as: LOPRESSOR Take 1 tablet (50 mg total) by mouth 2 (two) times daily.   nortriptyline 10 MG capsule Commonly known as: PAMELOR Take 1 capsule (10 mg total) by mouth at bedtime.   omeprazole 40 MG capsule Commonly known as: PRILOSEC Take 1 capsule (40 mg total) by mouth 2 (two) times daily.   Rybelsus 14 MG Tabs Generic drug: Semaglutide Take 1 tablet (14 mg total) by mouth daily.  venlafaxine XR 150 MG 24 hr capsule Commonly known as: EFFEXOR-XR Take 1 (one) tablet by mouth daily. What changed:  how much to take how to take this when to take this additional instructions       Allergies  Allergen Reactions   Lisinopril Cough      The results of significant diagnostics from this hospitalization (including imaging, microbiology, ancillary and laboratory) are listed below for reference.    Significant Diagnostic Studies: CT ABDOMEN PELVIS W CONTRAST  Result Date: 09/22/2022 CLINICAL DATA:  Abdominal pain and diarrhea. EXAM: CT ABDOMEN AND PELVIS WITH CONTRAST TECHNIQUE: Multidetector CT imaging of the abdomen and pelvis was performed using the standard protocol following bolus administration of intravenous contrast. RADIATION DOSE REDUCTION: This exam was performed according to  the departmental dose-optimization program which includes automated exposure control, adjustment of the mA and/or kV according to patient size and/or use of iterative reconstruction technique. CONTRAST:  124mL OMNIPAQUE IOHEXOL 300 MG/ML  SOLN COMPARISON:  January 08, 2010 FINDINGS: Lower chest: Atelectasis versus chronic interstitial changes in the dependent portions of the bilateral lower lobes. Hepatobiliary: No focal liver abnormality is seen. No gallstones, gallbladder wall thickening, or biliary dilatation. Pancreas: Unremarkable. No pancreatic ductal dilatation or surrounding inflammatory changes. Spleen: Normal in size without focal abnormality. Adrenals/Urinary Tract: Adrenal glands are unremarkable. Kidneys are normal, without renal calculi, focal lesion, or hydronephrosis. Bladder is unremarkable. Stomach/Bowel: Normal stomach and small bowel. Normal caliber of the colon. Minimal mucosal thickening at the rectosigmoid junction, nonspecific. Vascular/Lymphatic: No evidence of lymphadenopathy. No evidence of aortic atherosclerosis. Numerous postsurgical clips in the retroperitoneum and the mesentery. Reproductive: Status post hysterectomy. No adnexal masses. Other: No abdominal wall hernia or abnormality. No abdominopelvic ascites. Musculoskeletal: No acute or significant osseous findings. Post total right hip arthroplasty. IMPRESSION: 1. No acute abnormalities within the abdominal organs. 2. Minimal mucosal thickening at the rectosigmoid junction, nonspecific. 3. Atelectasis versus chronic interstitial changes in the dependent portions of the bilateral lower lobes. Electronically Signed   By: Fidela Salisbury M.D.   On: 09/22/2022 11:50    Microbiology: Recent Results (from the past 240 hour(s))  Resp panel by RT-PCR (RSV, Flu A&B, Covid) Anterior Nasal Swab     Status: None   Collection Time: 09/22/22 10:18 AM   Specimen: Anterior Nasal Swab  Result Value Ref Range Status   SARS Coronavirus 2 by  RT PCR NEGATIVE NEGATIVE Final    Comment: (NOTE) SARS-CoV-2 target nucleic acids are NOT DETECTED.  The SARS-CoV-2 RNA is generally detectable in upper respiratory specimens during the acute phase of infection. The lowest concentration of SARS-CoV-2 viral copies this assay can detect is 138 copies/mL. A negative result does not preclude SARS-Cov-2 infection and should not be used as the sole basis for treatment or other patient management decisions. A negative result may occur with  improper specimen collection/handling, submission of specimen other than nasopharyngeal swab, presence of viral mutation(s) within the areas targeted by this assay, and inadequate number of viral copies(<138 copies/mL). A negative result must be combined with clinical observations, patient history, and epidemiological information. The expected result is Negative.  Fact Sheet for Patients:  EntrepreneurPulse.com.au  Fact Sheet for Healthcare Providers:  IncredibleEmployment.be  This test is no t yet approved or cleared by the [place] FDA and  has been authorized for detection and/or diagnosis of SARS-CoV-2 by FDA under an Emergency Use Authorization (EUA). This EUA will remain  in effect (meaning this test can be used) for the duration  of the COVID-19 declaration under Section 564(b)(1) of the Act, 21 U.S.C.section 360bbb-3(b)(1), unless the authorization is terminated  or revoked sooner.       Influenza A by PCR NEGATIVE NEGATIVE Final   Influenza B by PCR NEGATIVE NEGATIVE Final    Comment: (NOTE) The Xpert Xpress SARS-CoV-2/FLU/RSV plus assay is intended as an aid in the diagnosis of influenza from Nasopharyngeal swab specimens and should not be used as a sole basis for treatment. Nasal washings and aspirates are unacceptable for Xpert Xpress SARS-CoV-2/FLU/RSV testing.  Fact Sheet for Patients: EntrepreneurPulse.com.au  Fact Sheet  for Healthcare Providers: IncredibleEmployment.be  This test is not yet approved or cleared by the [place] FDA and has been authorized for detection and/or diagnosis of SARS-CoV-2 by FDA under an Emergency Use Authorization (EUA). This EUA will remain in effect (meaning this test can be used) for the duration of the COVID-19 declaration under Section 564(b)(1) of the Act, 21 U.S.C. section 360bbb-3(b)(1), unless the authorization is terminated or revoked.     Resp Syncytial Virus by PCR NEGATIVE NEGATIVE Final    Comment: (NOTE) Fact Sheet for Patients: EntrepreneurPulse.com.au  Fact Sheet for Healthcare Providers: IncredibleEmployment.be  This test is not yet approved or cleared by the [place] FDA and has been authorized for detection and/or diagnosis of SARS-CoV-2 by FDA under an Emergency Use Authorization (EUA). This EUA will remain in effect (meaning this test can be used) for the duration of the COVID-19 declaration under Section 564(b)(1) of the Act, 21 U.S.C. section 360bbb-3(b)(1), unless the authorization is terminated or revoked.  Performed at [facility], [address] [place], [place] [address]   Gastrointestinal Panel by PCR , Stool     Status: Abnormal   Collection Time: 09/22/22  5:25 PM   Specimen: Urine, Clean Catch; Stool  Result Value Ref Range Status   Campylobacter species DETECTED (A) NOT DETECTED Final    Comment: RESULT CALLED TO, READ BACK BY AND VERIFIED WITH: Deneen Harts RN [id number] 09/23/22 HNM    Plesimonas shigelloides NOT DETECTED NOT DETECTED Final   Salmonella species NOT DETECTED NOT DETECTED Final   Yersinia enterocolitica NOT DETECTED NOT DETECTED Final   Vibrio species NOT DETECTED NOT DETECTED Final   Vibrio cholerae NOT DETECTED NOT DETECTED Final   Enteroaggregative E coli (EAEC) NOT DETECTED NOT DETECTED Final   Enteropathogenic E coli (EPEC) NOT  DETECTED NOT DETECTED Final   Enterotoxigenic E coli (ETEC) NOT DETECTED NOT DETECTED Final   Shiga like toxin producing E coli (STEC) NOT DETECTED NOT DETECTED Final   Shigella/Enteroinvasive E coli (EIEC) NOT DETECTED NOT DETECTED Final   Cryptosporidium NOT DETECTED NOT DETECTED Final   Cyclospora cayetanensis NOT DETECTED NOT DETECTED Final   Entamoeba histolytica NOT DETECTED NOT DETECTED Final   Giardia lamblia NOT DETECTED NOT DETECTED Final   Adenovirus F40/41 DETECTED (A) NOT DETECTED Final   Astrovirus NOT DETECTED NOT DETECTED Final   Norovirus GI/GII NOT DETECTED NOT DETECTED Final   Rotavirus A NOT DETECTED NOT DETECTED Final   Sapovirus (I, II, IV, and V) NOT DETECTED NOT DETECTED Final    Comment: Performed at [facility], [address] [place], [place] [address]  C Difficile Quick Screen w PCR reflex     Status: None   Collection Time: 09/23/22 12:35 PM   Specimen: STOOL  Result Value Ref Range Status   C Diff antigen NEGATIVE NEGATIVE Final   C Diff toxin NEGATIVE NEGATIVE Final  C Diff interpretation No C. difficile detected.  Final    Comment: Performed at [facility], [place] [address] [place], [place] [address]     Labs: Basic Metabolic Panel: Recent Labs  Lab 09/22/22 1014 09/22/22 1022 09/22/22 1240 09/23/22 0538 09/24/22 0650 09/25/22 0528  NA 136 140  --  140 141 142  K 2.6* 2.6*  --  3.4* 3.5 3.2*  CL 99  --   --  108 108 108  CO2 26  --   --  26 27 27   GLUCOSE 133*  --   --  96 95 100*  BUN 17  --   --  11 11 10   CREATININE 0.84  --   --  0.67 0.59 0.64  CALCIUM 8.3*  --   --  7.9* 8.7* 8.6*  MG  --   --  1.7  --   --   --   PHOS  --   --  2.8  --   --   --    Liver Function Tests: Recent Labs  Lab 09/22/22 1014 09/23/22 0538  AST 28 19  ALT 29 25  ALKPHOS 55 45  BILITOT 0.8 0.6  PROT 7.0 5.8*  ALBUMIN 3.7 3.4*   No results for input(s): "LIPASE", "AMYLASE" in the last 168 hours. No results for  input(s): "AMMONIA" in the last 168 hours. CBC: Recent Labs  Lab 09/22/22 1014 09/22/22 1022 09/23/22 0538 09/24/22 0650 09/25/22 0528  WBC 9.1  --  5.8 5.1 5.2  NEUTROABS 5.3  --   --   --  2.2  HGB 12.1 12.2 10.7* 10.6* 10.9*  HCT 36.9 36.0 33.6* 32.7* 33.6*  MCV 90.4  --  93.9 93.7 90.6  PLT 227  --  195 205 222   Cardiac Enzymes: No results for input(s): "CKTOTAL", "CKMB", "CKMBINDEX", "TROPONINI" in the last 168 hours. BNP: BNP (last 3 results) No results for input(s): "BNP" in the last 8760 hours.  ProBNP (last 3 results) No results for input(s): "PROBNP" in the last 8760 hours.  CBG: Recent Labs  Lab 09/24/22 0714 09/24/22 1116 09/24/22 1625 09/24/22 2058 09/25/22 0742  GLUCAP 85 116* 92 100* 105*       Signed:  Nita Sells MD   [facility] 09/25/2022, 9:57 AM

## 2022-09-25 NOTE — Plan of Care (Signed)

## 2022-09-29 ENCOUNTER — Telehealth: Payer: Self-pay

## 2022-09-29 NOTE — Telephone Encounter (Signed)
Transition Care Management Follow-up Telephone Call  Date discharged? 09/25/22  How have you been since you were released from the hospital? Good. Dry mouth and fatigue.  Do you understand why you were in the hospital? yes  Do you understand the discharge instructions? yes  Where were you discharged to? Home    Items Reviewed: Medications reviewed: yes Allergies reviewed: yes Dietary changes reviewed: yes Referrals reviewed: yes   Functional Questionnaire:  Activities of Daily Living (ADLs):   She states they are independent in the following:  all the above States they require assistance with the following:  none  Any transportation issues/concerns?: no  Any patient concerns? Yes - Potasium levels  Confirmed importance and date/time of follow-up visits scheduled yes Dr Sarajane Jews 10/06/22 at 3:30 pm  Confirmed with patient if condition begins to worsen call PCP or go to the ER.  Patient was given the office number and encouraged to call back with question or concerns.  : yes

## 2022-09-29 NOTE — Telephone Encounter (Signed)
error 

## 2022-10-05 ENCOUNTER — Encounter: Payer: Self-pay | Admitting: Internal Medicine

## 2022-10-06 ENCOUNTER — Ambulatory Visit (INDEPENDENT_AMBULATORY_CARE_PROVIDER_SITE_OTHER): Payer: BC Managed Care – PPO | Admitting: Family Medicine

## 2022-10-06 ENCOUNTER — Encounter: Payer: Self-pay | Admitting: Family Medicine

## 2022-10-06 VITALS — BP 98/70 | HR 68 | Temp 97.6°F | Wt 199.0 lb

## 2022-10-06 DIAGNOSIS — E876 Hypokalemia: Secondary | ICD-10-CM | POA: Diagnosis not present

## 2022-10-06 DIAGNOSIS — K529 Noninfective gastroenteritis and colitis, unspecified: Secondary | ICD-10-CM

## 2022-10-06 NOTE — Progress Notes (Signed)
   Subjective:    Patient ID: Morgan [name], female    DOB: 01/22/1966, 57 y.o.   MRN: [id number]  HPI Here for a transitional care follow up of a hospital stay from 09-22-22 to 09-25-22 for abdominal pains, nausea with vomiting, and diarrhea. She was eventually diagnosed with a gastroenteritis due to a combination of Campylobacter and Adenovirus F40/41. Her initial potassium was quite low at 2.6, and she was very dehydrated. Her magnesium was at the lower end of normal at 1.7. Her creatinine was normal, and it ranged from 0.64 to 0.84. She was rehydrated with IV fluids, and the potassium was replenished. The potassium was up to 3.2 by DC. Since going home, she is slowly regaining strength and her appetite is almost back to normal. She had a normal BM 3 days ago (her typical pattern is having a BM every 2-3 days. She is back on Miralax daily.    Review of Systems  Constitutional:  Positive for fatigue. Negative for diaphoresis and fever.  Respiratory: Negative.    Cardiovascular: Negative.   Gastrointestinal:  Positive for abdominal pain, diarrhea, nausea and vomiting. Negative for abdominal distention, blood in stool, constipation and rectal pain.  Genitourinary: Negative.        Objective:   Physical Exam Constitutional:      Appearance: Normal appearance. She is not ill-appearing.  Cardiovascular:     Rate and Rhythm: Normal rate and regular rhythm.     Pulses: Normal pulses.     Heart sounds: Normal heart sounds.  Pulmonary:     Effort: Pulmonary effort is normal.     Breath sounds: Normal breath sounds.  Abdominal:     General: Abdomen is flat. Bowel sounds are normal. There is no distension.     Palpations: Abdomen is soft. There is no mass.     Tenderness: There is no abdominal tenderness. There is no right CVA tenderness, left CVA tenderness, guarding or rebound.     Hernia: No hernia is present.  Musculoskeletal:     Right lower leg: No edema.     Left lower leg: No  edema.  Skin:    Coloration: Skin is not jaundiced.  Neurological:     General: No focal deficit present.     Mental Status: She is alert and oriented to person, place, and time.           Assessment & Plan:  She is recovering from a gastroenteritis due to Campylobacter and Adenovirus. She will advance her diet as tolerated and drink plenty of  fluids. We will check a BMET today for her potassium level and her renal status. We spent a total of (34   ) minutes reviewing records and discussing these issues.  Gershon Crane, MD

## 2022-10-07 ENCOUNTER — Encounter: Payer: Self-pay | Admitting: Family Medicine

## 2022-10-07 LAB — BASIC METABOLIC PANEL
BUN: 18 mg/dL (ref 6–23)
CO2: 31 mEq/L (ref 19–32)
Calcium: 10.2 mg/dL (ref 8.4–10.5)
Chloride: 100 mEq/L (ref 96–112)
Creatinine, Ser: 1.1 mg/dL (ref 0.40–1.20)
GFR: 55.2 mL/min — ABNORMAL LOW (ref >60.00)
Glucose, Bld: 74 mg/dL (ref 70–99)
Potassium: 3.7 mEq/L (ref 3.5–5.1)
Sodium: 142 mEq/L (ref 135–145)

## 2022-10-08 NOTE — Telephone Encounter (Signed)
That's correct, she does not need to take any potassium pills

## 2022-10-09 DIAGNOSIS — F321 Major depressive disorder, single episode, moderate: Secondary | ICD-10-CM | POA: Diagnosis not present

## 2022-10-18 DIAGNOSIS — M9903 Segmental and somatic dysfunction of lumbar region: Secondary | ICD-10-CM | POA: Diagnosis not present

## 2022-10-18 DIAGNOSIS — M9905 Segmental and somatic dysfunction of pelvic region: Secondary | ICD-10-CM | POA: Diagnosis not present

## 2022-10-18 DIAGNOSIS — M9902 Segmental and somatic dysfunction of thoracic region: Secondary | ICD-10-CM | POA: Diagnosis not present

## 2022-10-18 DIAGNOSIS — M9901 Segmental and somatic dysfunction of cervical region: Secondary | ICD-10-CM | POA: Diagnosis not present

## 2022-10-23 ENCOUNTER — Encounter: Payer: Self-pay | Admitting: Obstetrics and Gynecology

## 2022-10-23 ENCOUNTER — Ambulatory Visit (INDEPENDENT_AMBULATORY_CARE_PROVIDER_SITE_OTHER): Payer: BC Managed Care – PPO | Admitting: Obstetrics and Gynecology

## 2022-10-23 VITALS — BP 115/83 | HR 77 | Resp 16 | Ht 64.0 in | Wt 206.0 lb

## 2022-10-23 DIAGNOSIS — N958 Other specified menopausal and perimenopausal disorders: Secondary | ICD-10-CM | POA: Diagnosis not present

## 2022-10-23 DIAGNOSIS — Z1231 Encounter for screening mammogram for malignant neoplasm of breast: Secondary | ICD-10-CM

## 2022-10-23 DIAGNOSIS — Z8543 Personal history of malignant neoplasm of ovary: Secondary | ICD-10-CM | POA: Diagnosis not present

## 2022-10-23 MED ORDER — ESTRADIOL 0.1 MG/GM VA CREA: TOPICAL_CREAM | VAGINAL | 12 refills | Status: DC

## 2022-10-23 NOTE — Progress Notes (Signed)
NEW GYNECOLOGY VISIT  Subjective:  Morgan [name] is a 57 y.o. 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   Had not had intercourse in 20+ years. When she tried to have intercourse with a female partner, she reports that he couldn't insert his penis due to pain & lack of space. Afterwards she noted that she was bleeding.   Has not been on HRT in years.   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 Her sister died of ovarian cancer. No genetic testing that she is aware of.  Past Medical History:  Diagnosis Date   Anal fissure    saw Dr. Loretha Brasil    Anxiety    on meds   Arthritis    on meds   Asthma    uses inhaler if needed   Carpal tunnel syndrome    Depression    on meds   Diabetes mellitus    on meds   GERD (gastroesophageal reflux disease)    on meds   History of ovarian cancer in adulthood    Hyperlipidemia    on meds   Hypertension    on meds   Ovarian cancer [facility]) 1986   1996   Skin cancer (melanoma) (HCC) 1997   Sleep apnea    uses CPAP    Tubular adenoma of colon 01/2014   Past Surgical History:  Procedure Laterality Date   ABDOMINAL HYSTERECTOMY  06/30/1984   BSO   BASAL CELL CARCINOMA EXCISION  10/09/2012   lower lip per Dr. Park Liter    COLONOSCOPY  03/23/2019   per Dr. Adela Lank, adenomatous polyps, repeat in 3 yrs   GLBS tumor ovary 1986     HEMORRHOID SURGERY     HIP FRACTURE SURGERY Right 2007   MELANOMA EXCISION  07/01/1995   upper chest    Current Outpatient Medications on File Prior to Visit  Medication Sig Dispense Refill   albuterol (VENTOLIN HFA) 108 (90 Base) MCG/ACT inhaler Inhale 2 puffs into the lungs every 6 (six) hours as needed for wheezing or shortness of breath. 8 g 6   ALPRAZolam (XANAX) 1 MG tablet Take 1 tablet (1 mg total) by mouth at bedtime as  needed for sleep. 90 tablet 1   atorvastatin (LIPITOR) 80 MG tablet Take 1 tablet (80 mg total) by mouth daily. (Patient taking differently: Take 80 mg by mouth at bedtime.) 90 tablet 1   glimepiride (AMARYL) 1 MG tablet Take 1 tablet (1 mg total) by mouth daily with breakfast. 90 tablet 3   losartan-hydrochlorothiazide (HYZAAR) 50-12.5 MG tablet Take 1 tablet by mouth daily. (Patient taking differently: Take 1 tablet by mouth at bedtime.) 90 tablet 1   metFORMIN (GLUCOPHAGE-XR) 500 MG 24 hr tablet Take 1 tablet (500 mg total) by mouth in the morning and at bedtime. 180 tablet 3   metoprolol tartrate (LOPRESSOR) 50 MG tablet Take 1 tablet (50 mg total) by mouth 2 (two) times daily. 180 tablet 1   nortriptyline (PAMELOR) 10 MG capsule Take 1 capsule (10 mg total) by mouth at bedtime. 30 capsule 2   omeprazole (PRILOSEC) 40 MG capsule Take 1 capsule (40 mg total) by mouth 2 (two) times daily. 180 capsule 2   Semaglutide (RYBELSUS) 14 MG TABS Take 1 tablet (14 mg total) by mouth daily. 90 tablet 3   venlafaxine XR (EFFEXOR-XR)  150 MG 24 hr capsule Take 1 (one) tablet by mouth daily. (Patient taking differently: Take 150 mg by mouth daily.) 90 capsule 3   No current facility-administered medications on file prior to visit.   Allergies  Allergen Reactions   Lisinopril Cough   OB History     Gravida  0   Para  0   Term  0   Preterm  0   AB  0   Living  0      SAB  0   IAB  0   Ectopic  0   Multiple  0   Live Births  0          Social History   Socioeconomic History   Marital status: Single    Spouse name: Not on file   Number of children: Not on file   Years of education: Not on file   Highest education level: Not on file  Occupational History   Occupation: billing     Employer: [employer]  Tobacco Use   Smoking status: Never   Smokeless tobacco: Never  Vaping Use   Vaping Use: Never used  Substance and Sexual Activity   Alcohol use: No     Alcohol/week: 0.0 standard drinks of alcohol   Drug use: No   Sexual activity: Yes    Birth control/protection: Surgical  Other Topics Concern   Not on file  Social History Narrative   Originally from [place], [place], & moved to [place] at 57 y.o. She reports she started a new job in July 2016. 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 No pets currently. No bird, mold, or hot tub exposure.       Right Handed    Lives in a one story home    Social Determinants of Health   Financial Resource Strain: Not on file  Food Insecurity: No Food Insecurity (09/22/2022)   Hunger Vital Sign    Worried About Running Out of Food in the Last Year: Never true    Ran Out of Food in the Last Year: Never true  Transportation Needs: No Transportation Needs (09/22/2022)   PRAPARE - Administrator, Civil Service (Medical): No    Lack of Transportation (Non-Medical): No  Physical Activity: Not on file  Stress: Not on file  Social Connections: Not on file  Intimate Partner Violence: Not At Risk (09/22/2022)   Humiliation, Afraid, Rape, and Kick questionnaire    Fear of Current or Ex-Partner: No    Emotionally Abused: No    Physically Abused: No    Sexually Abused: No    Objective:   Vitals:   10/23/22 1326  BP: 115/83  Pulse: 77  Resp: 16  Weight: 206 lb (93.4 kg)  Height:  (1.626 m)   General:  Alert, oriented and cooperative. Patient is in no acute distress.  Skin: Skin is warm and dry. No rash noted.   Cardiovascular: Normal heart rate noted  Respiratory: Normal respiratory effort, no problems with respiration noted  Abdomen: Soft, non-tender, non-distended   Pelvic: Abrasion noted at posterior fourchette. Narrow introitus with pale vaginal mucosa c/w menopausal changes. No palpable or visible vaginal mass. Cuff intact.   Exam  performed in the presence of a chaperone  Assessment and Plan:  Morgan [name]  is a 57 y.o. with bleeding with intercourse due to GSM  Genitourinary syndrome of menopause Discussed use of lubricants & slow approach to intercourse Reviewed option for vaginal estrogen. She is agreeable -     estradiol (ESTRACE VAGINAL) 0.1 MG/GM vaginal cream; Place 1 Applicatorful vaginally at bedtime for 14 days, THEN 1 Applicatorful 2 (two) times a week.  Breast cancer screening by mammogram -     MM Digital Screening; Future  History of ovarian cancer -     Ambulatory referral to Genetics  Return in about 3 months (around 01/22/2023) for follow up gyn symptoms.  Future Appointments  Date Time Provider Department Center  02/06/2023  2:40 PM Shamleffer, Konrad Dolores, MD [facility] None   Lennart Pall, MD

## 2022-10-23 NOTE — Progress Notes (Signed)
Not had intercourse since 1996.  Had intercourse the other day and partner said she was too tight and notice some bleeding afterwards.  She has bought Astroglide but stated she didn't know how to use it.  Pt has a strong H/O various cancers.

## 2022-11-01 DIAGNOSIS — M9905 Segmental and somatic dysfunction of pelvic region: Secondary | ICD-10-CM | POA: Diagnosis not present

## 2022-11-01 DIAGNOSIS — M9902 Segmental and somatic dysfunction of thoracic region: Secondary | ICD-10-CM | POA: Diagnosis not present

## 2022-11-01 DIAGNOSIS — M9903 Segmental and somatic dysfunction of lumbar region: Secondary | ICD-10-CM | POA: Diagnosis not present

## 2022-11-01 DIAGNOSIS — M9901 Segmental and somatic dysfunction of cervical region: Secondary | ICD-10-CM | POA: Diagnosis not present

## 2022-11-06 ENCOUNTER — Encounter: Payer: Self-pay | Admitting: Family Medicine

## 2022-11-06 DIAGNOSIS — F321 Major depressive disorder, single episode, moderate: Secondary | ICD-10-CM | POA: Diagnosis not present

## 2022-11-07 ENCOUNTER — Encounter: Payer: Self-pay | Admitting: Family Medicine

## 2022-11-08 DIAGNOSIS — M9902 Segmental and somatic dysfunction of thoracic region: Secondary | ICD-10-CM | POA: Diagnosis not present

## 2022-11-08 DIAGNOSIS — M9905 Segmental and somatic dysfunction of pelvic region: Secondary | ICD-10-CM | POA: Diagnosis not present

## 2022-11-08 DIAGNOSIS — M9903 Segmental and somatic dysfunction of lumbar region: Secondary | ICD-10-CM | POA: Diagnosis not present

## 2022-11-08 DIAGNOSIS — M9901 Segmental and somatic dysfunction of cervical region: Secondary | ICD-10-CM | POA: Diagnosis not present

## 2022-11-10 ENCOUNTER — Other Ambulatory Visit: Payer: Self-pay

## 2022-11-10 DIAGNOSIS — I10 Essential (primary) hypertension: Secondary | ICD-10-CM

## 2022-11-10 MED ORDER — METOPROLOL TARTRATE 50 MG PO TABS: 50.0000 mg | ORAL_TABLET | Freq: Two times a day (BID) | ORAL | 1 refills | Status: DC

## 2022-11-11 NOTE — Telephone Encounter (Signed)
Noted  

## 2022-11-13 DIAGNOSIS — M9903 Segmental and somatic dysfunction of lumbar region: Secondary | ICD-10-CM | POA: Diagnosis not present

## 2022-11-13 DIAGNOSIS — M9902 Segmental and somatic dysfunction of thoracic region: Secondary | ICD-10-CM | POA: Diagnosis not present

## 2022-11-13 DIAGNOSIS — M9905 Segmental and somatic dysfunction of pelvic region: Secondary | ICD-10-CM | POA: Diagnosis not present

## 2022-11-13 DIAGNOSIS — M9901 Segmental and somatic dysfunction of cervical region: Secondary | ICD-10-CM | POA: Diagnosis not present

## 2022-11-18 ENCOUNTER — Ambulatory Visit (INDEPENDENT_AMBULATORY_CARE_PROVIDER_SITE_OTHER): Payer: BC Managed Care – PPO

## 2022-11-18 ENCOUNTER — Ambulatory Visit (INDEPENDENT_AMBULATORY_CARE_PROVIDER_SITE_OTHER): Payer: BC Managed Care – PPO | Admitting: Family Medicine

## 2022-11-18 ENCOUNTER — Encounter: Payer: Self-pay | Admitting: Family Medicine

## 2022-11-18 VITALS — BP 110/72 | HR 83 | Temp 98.2°F | Wt 200.0 lb

## 2022-11-18 DIAGNOSIS — M7989 Other specified soft tissue disorders: Secondary | ICD-10-CM | POA: Diagnosis not present

## 2022-11-18 DIAGNOSIS — S93401A Sprain of unspecified ligament of right ankle, initial encounter: Secondary | ICD-10-CM

## 2022-11-18 DIAGNOSIS — M25571 Pain in right ankle and joints of right foot: Secondary | ICD-10-CM | POA: Diagnosis not present

## 2022-11-18 NOTE — Progress Notes (Signed)
   Subjective:    Patient ID: Morgan [name], female    DOB: Apr 19, 1966, 57 y.o.   MRN: [id number]  HPI Here for one day of swelling and pain in the right lateral ankle. No trauma that she can remember.  She has been applying ice.    Review of Systems  Constitutional: Negative.   Respiratory: Negative.    Cardiovascular: Negative.   Musculoskeletal:  Positive for arthralgias.       Objective:   Physical Exam Constitutional:      Comments: In pain, limping   Cardiovascular:     Rate and Rhythm: Normal rate and regular rhythm.     Pulses: Normal pulses.     Heart sounds: Normal heart sounds.  Pulmonary:     Effort: Pulmonary effort is normal.     Breath sounds: Normal breath sounds.  Musculoskeletal:     Comments: Right lateral ankle is swollen and tender. ROM is limited by pain. No erythema or warmth   Neurological:     Mental Status: She is alert.           Assessment & Plan:  Right ankle sprain. We will get Xrays today to rule out fractures. She will stay off the foot and wear an elastic support sleeve. Take Ibuprofen as needed.  Gershon Crane, MD

## 2022-11-20 DIAGNOSIS — F321 Major depressive disorder, single episode, moderate: Secondary | ICD-10-CM | POA: Diagnosis not present

## 2022-11-26 ENCOUNTER — Encounter: Payer: Self-pay | Admitting: Obstetrics and Gynecology

## 2022-11-26 ENCOUNTER — Ambulatory Visit (INDEPENDENT_AMBULATORY_CARE_PROVIDER_SITE_OTHER): Payer: BC Managed Care – PPO

## 2022-11-26 DIAGNOSIS — Z111 Encounter for screening for respiratory tuberculosis: Secondary | ICD-10-CM | POA: Diagnosis not present

## 2022-11-26 DIAGNOSIS — G4733 Obstructive sleep apnea (adult) (pediatric): Secondary | ICD-10-CM | POA: Diagnosis not present

## 2022-11-26 NOTE — Progress Notes (Signed)
PPD Placement note [name], 57 y.o. female is here today for placement of PPD test Reason for PPD test: Work Pt taken PPD test before: no Verified in allergy area and with patient that they are not allergic to the products PPD is made of (Phenol or Tween). Yes Is patient taking any oral or IV steroid medication now or have they taken it in the last month? no Has the patient ever received the BCG vaccine?: no Has the patient been in recent contact with anyone known or suspected of having active TB disease?: no      Date of exposure (if applicable): N/A      Name of person they were exposed to (if applicable): N/A Patient's Country of origin?: [place] O: Alert and oriented in NAD. P:  PPD placed on 11/26/2022.  Patient advised to return for reading within 48-72 hours.

## 2022-11-27 ENCOUNTER — Ambulatory Visit (HOSPITAL_BASED_OUTPATIENT_CLINIC_OR_DEPARTMENT_OTHER)
Admission: RE | Admit: 2022-11-27 | Discharge: 2022-11-27 | Disposition: A | Discharge status: Home / Self Care | Payer: BC Managed Care – PPO | Source: Ambulatory Visit | Attending: Obstetrics and Gynecology | Admitting: Obstetrics and Gynecology

## 2022-11-27 DIAGNOSIS — Z1231 Encounter for screening mammogram for malignant neoplasm of breast: Secondary | ICD-10-CM | POA: Diagnosis not present

## 2022-11-28 ENCOUNTER — Ambulatory Visit: Payer: BC Managed Care – PPO

## 2022-11-28 LAB — TB SKIN TEST
Induration: 0 mm
TB Skin Test: NEGATIVE

## 2022-11-28 NOTE — Progress Notes (Signed)
Pt is here for tb read. Tb negative.   Pt requestied a documentation to bring to work place. Result note was printed and given to pt.

## 2022-12-02 DIAGNOSIS — F321 Major depressive disorder, single episode, moderate: Secondary | ICD-10-CM | POA: Diagnosis not present

## 2022-12-03 ENCOUNTER — Ambulatory Visit: Payer: BC Managed Care – PPO

## 2022-12-09 ENCOUNTER — Encounter: Payer: Self-pay | Admitting: Internal Medicine

## 2022-12-10 ENCOUNTER — Encounter: Payer: Self-pay | Admitting: Family Medicine

## 2022-12-10 ENCOUNTER — Encounter: Payer: Self-pay | Admitting: Internal Medicine

## 2022-12-11 ENCOUNTER — Inpatient Hospital Stay: Payer: PRIVATE HEALTH INSURANCE

## 2022-12-11 ENCOUNTER — Other Ambulatory Visit: Payer: Self-pay

## 2022-12-11 ENCOUNTER — Inpatient Hospital Stay: Payer: PRIVATE HEALTH INSURANCE | Attending: Genetic Counselor | Admitting: Genetic Counselor

## 2022-12-11 ENCOUNTER — Other Ambulatory Visit: Payer: Self-pay | Admitting: Genetic Counselor

## 2022-12-11 DIAGNOSIS — Z85820 Personal history of malignant melanoma of skin: Secondary | ICD-10-CM

## 2022-12-11 DIAGNOSIS — Z8041 Family history of malignant neoplasm of ovary: Secondary | ICD-10-CM | POA: Diagnosis not present

## 2022-12-11 DIAGNOSIS — Z86010 Personal history of colonic polyps: Secondary | ICD-10-CM

## 2022-12-11 DIAGNOSIS — Z8543 Personal history of malignant neoplasm of ovary: Secondary | ICD-10-CM | POA: Diagnosis not present

## 2022-12-11 LAB — GENETIC SCREENING ORDER

## 2022-12-11 NOTE — Progress Notes (Signed)
REFERRING PROVIDER: Lennart Pall, MD [address] [place],  [place] [address]   PRIMARY PROVIDER:  Nelwyn Salisbury, MD   PRIMARY REASON FOR VISIT:  Encounter Diagnoses  Name Primary?   History of ovarian cancer in adulthood Yes   Family history of ovarian cancer    Personal history of malignant melanoma    Personal history of colonic polyps      HISTORY OF PRESENT ILLNESS:   [name], a 57 y.o. female, was seen for a [place] cancer genetics consultation at the request of Dr. Berton Lan due to a personal and family history of ovarian cancer.  Ms. Preacher presents to clinic today to discuss the possibility of a hereditary predisposition to cancer, to discuss genetic testing, and to further clarify her future cancer risks, as well as potential cancer risks for family members.   In 1987, 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 She reports a history of ~15 'golf-ball sized' tumors of her abdomen.      CANCER HISTORY:  Oncology History   No history exists.     RISK FACTORS:  Mammogram within the last year: yes.  Number of breast biopsies: 0. Colonoscopy: yes;  more than 10 lifetime tubular adenomas . Menarche was at age 15.  Nulliparous.  OCP use for approximately 2 years.  HRT use: yes; estrogen for 3-4 years post BSO.  Dermatology screening: yes  Past Medical History:  Diagnosis Date   Anal fissure    saw Dr. Loretha Brasil    Anxiety    on meds   Arthritis    on meds   Asthma    uses inhaler if needed   Carpal tunnel syndrome    Depression    on meds   Diabetes mellitus    on meds   GERD (gastroesophageal reflux disease)    on meds   History of ovarian cancer in adulthood    Hyperlipidemia    on meds   Hypertension    on meds   Ovarian cancer [facility]) 1986   1996   Skin cancer  (melanoma) (HCC) 1997   Sleep apnea    uses CPAP    Tubular adenoma of colon 01/2014    Past Surgical History:  Procedure Laterality Date   ABDOMINAL HYSTERECTOMY  06/30/1984   BSO   BASAL CELL CARCINOMA EXCISION  10/09/2012   lower lip per Dr. Park Liter    COLONOSCOPY  03/23/2019   per Dr. Adela Lank, adenomatous polyps, repeat in 3 yrs   GLBS tumor ovary 1986     HEMORRHOID SURGERY     HIP FRACTURE SURGERY Right 2007   MELANOMA EXCISION  07/01/1995   upper chest     FAMILY HISTORY:  We obtained a detailed, 4-generation family history.  Significant diagnoses are listed below: Family History  Problem Relation Age of Onset   Ovarian cancer Sister 10   Basal cell carcinoma Brother        lower lip   Stomach cancer Paternal Grandmother        dx 62s-80s      Ms. Geno is unaware of previous family history of genetic testing for hereditary cancer risks. There is no reported Ashkenazi Jewish ancestry. There is no known consanguinity.  GENETIC COUNSELING ASSESSMENT: [name] is a 57 y.o. female  with a family history of ovarian cancer and a personal history of more than 10 lifetime tubular adenomas.  We, therefore, discussed and recommended the following at today's visit.   DISCUSSION: We discussed that 5 - 10% of cancer is hereditary, with most cases of hereditary ovarian cancer associated with mutations in BRCA1/2.  We discussed that while teratomas typically aren't hereditary; her sister's ovarian cancer (likely epithelial) warrants genetic testing.  There are other genes that can be associated with polyposis.  These include but are not limited to APC and MUTYH.  We discussed that testing is beneficial for several reasons, including knowing about other cancer risks, identifying potential screening and risk-reduction options that may be appropriate, and to understanding if other family members could be at risk for cancer and allowing them to undergo genetic testing.  We  reviewed the characteristics, features and inheritance patterns of hereditary cancer syndromes. We also discussed genetic testing, including the appropriate family members to test, the process of testing, insurance coverage and turn-around-time for results. We discussed the implications of a negative, positive, and variant of uncertain significant result.  We recommended Morgan [name] pursue genetic testing for a panel that contains genes associated with ovarian cancer, melanoma, colon polyps, and other cancers.  The CancerNext-Expanded gene panel offered by [facility] and includes sequencing, rearrangement, and RNA analysis for the following 71 genes:  AIP, ALK, APC, ATM, BAP1, BARD1, BMPR1A, BRCA1, BRCA2, BRIP1, CDC73, CDH1, CDK4, CDKN1B, CDKN2A, CHEK2, DICER1, FH, FLCN, KIF1B, LZTR1,MAX, MEN1, MET, MLH1, MSH2, MSH6, MUTYH, NF1, NF2, NTHL1, PALB2, PHOX2B, PMS2, POT1, PRKAR1A, PTCH1, PTEN, RAD51C,RAD51D, RB1, RET, SDHA, SDHAF2, SDHB, SDHC, SDHD, SMAD4, SMARCA4, SMARCB1, SMARCE1, STK11, SUFU, TMEM127, TP53,TSC1, TSC2 and VHL (sequencing and deletion/duplication); AXIN2, CTNNA1, EGFR, EGLN1, HOXB13, KIT, MITF, MSH3, PDGFRA, POLD1 and POLE (sequencing only); EPCAM and GREM1 (deletion/duplication only).   Based on Ms. Woodside's family history of ovarian cancer and personal history of more than 10 colon polyps, she meets medical criteria for genetic testing. Despite that she meets criteria, she may still have an out of pocket cost. We discussed that if her out of pocket cost for testing is over $100, the laboratory should contact them to discuss self-pay options and/or patient pay assistance programs.   PLAN: After considering the risks, benefits, and limitations, Ms. Thumann provided informed consent to pursue genetic testing and the blood sample was sent to [facility] for analysis of the CancerNext-Expanded +RNAinsight Panel. Results should be available within approximately 3 weeks' time, at which  point they will be disclosed by telephone to Ms. Youngman, as will any additional recommendations warranted by these results. Ms. Shutters will receive a summary of her genetic counseling visit and a copy of her results once available. This information will also be available in Epic.    Ms. Paolo questions were answered to her satisfaction today. Our contact information was provided should additional questions or concerns arise. Thank you for the referral and allowing [place] to share in the care of your patient.   Jairus Tonne M. Rennie Plowman, MS, [facility] Genetic Counselor [email or web address] (P) [phone]   The patient was seen for a total of 40 minutes in face-to-face genetic counseling.  The patient was seen alone.  Drs. Gunnar Bulla, and/or [place] were available to discuss this case as needed.  _______________________________________________________________________ For Office Staff:  Number of people involved in session: 1 Was an Intern/ student involved with case: no

## 2022-12-12 NOTE — Telephone Encounter (Signed)
I am very sorry, but apparently [facility] does not participate with this plan. There is nothing I can do

## 2022-12-15 ENCOUNTER — Encounter: Payer: Self-pay | Admitting: Genetic Counselor

## 2022-12-15 DIAGNOSIS — Z8543 Personal history of malignant neoplasm of ovary: Secondary | ICD-10-CM | POA: Insufficient documentation

## 2022-12-16 ENCOUNTER — Telehealth: Payer: Self-pay | Admitting: Genetic Counselor

## 2022-12-16 ENCOUNTER — Telehealth: Payer: Self-pay | Admitting: *Deleted

## 2022-12-16 ENCOUNTER — Encounter: Payer: Self-pay | Admitting: Genetic Counselor

## 2022-12-16 NOTE — Telephone Encounter (Signed)
Patient called about TAT for genetics results.  Told her that result will be back in ~2 weeks.  Also stated that sister had endometrial cancer in her 56s (previously reported that it was ovarian.

## 2022-12-16 NOTE — Telephone Encounter (Signed)
Returned call from 8:51 AM. Left patient a message to call and schedule 3 month F/U visit if she needs it.

## 2022-12-20 ENCOUNTER — Encounter: Payer: Self-pay | Admitting: Internal Medicine

## 2022-12-20 LAB — AMB RESULTS CONSOLE CBG: Glucose: 118

## 2022-12-24 ENCOUNTER — Encounter: Payer: Self-pay | Admitting: Internal Medicine

## 2022-12-29 ENCOUNTER — Inpatient Hospital Stay
Admission: AD | Admit: 2022-12-29 | Discharge: 2023-01-01 | DRG: 885 | Disposition: A | Discharge status: Home / Self Care | Payer: PRIVATE HEALTH INSURANCE | Source: Intra-hospital | Attending: Psychiatry | Admitting: Psychiatry

## 2022-12-29 ENCOUNTER — Ambulatory Visit (HOSPITAL_COMMUNITY)
Admission: EM | Admit: 2022-12-29 | Discharge: 2022-12-29 | Disposition: A | Discharge status: Nursing Home (Includes ICF) | Payer: PRIVATE HEALTH INSURANCE | Attending: Psychiatry | Admitting: Psychiatry

## 2022-12-29 DIAGNOSIS — J45909 Unspecified asthma, uncomplicated: Secondary | ICD-10-CM | POA: Insufficient documentation

## 2022-12-29 DIAGNOSIS — Z560 Unemployment, unspecified: Secondary | ICD-10-CM

## 2022-12-29 DIAGNOSIS — E119 Type 2 diabetes mellitus without complications: Secondary | ICD-10-CM | POA: Insufficient documentation

## 2022-12-29 DIAGNOSIS — Z7984 Long term (current) use of oral hypoglycemic drugs: Secondary | ICD-10-CM | POA: Insufficient documentation

## 2022-12-29 DIAGNOSIS — R45851 Suicidal ideations: Secondary | ICD-10-CM | POA: Diagnosis not present

## 2022-12-29 DIAGNOSIS — Z85820 Personal history of malignant melanoma of skin: Secondary | ICD-10-CM

## 2022-12-29 DIAGNOSIS — F419 Anxiety disorder, unspecified: Secondary | ICD-10-CM | POA: Diagnosis not present

## 2022-12-29 DIAGNOSIS — K219 Gastro-esophageal reflux disease without esophagitis: Secondary | ICD-10-CM | POA: Diagnosis present

## 2022-12-29 DIAGNOSIS — F411 Generalized anxiety disorder: Secondary | ICD-10-CM | POA: Diagnosis present

## 2022-12-29 DIAGNOSIS — I10 Essential (primary) hypertension: Secondary | ICD-10-CM | POA: Diagnosis present

## 2022-12-29 DIAGNOSIS — R9431 Abnormal electrocardiogram [ECG] [EKG]: Secondary | ICD-10-CM | POA: Insufficient documentation

## 2022-12-29 DIAGNOSIS — F332 Major depressive disorder, recurrent severe without psychotic features: Principal | ICD-10-CM | POA: Diagnosis present

## 2022-12-29 DIAGNOSIS — G4730 Sleep apnea, unspecified: Secondary | ICD-10-CM | POA: Insufficient documentation

## 2022-12-29 DIAGNOSIS — Z79899 Other long term (current) drug therapy: Secondary | ICD-10-CM | POA: Diagnosis not present

## 2022-12-29 DIAGNOSIS — Z85828 Personal history of other malignant neoplasm of skin: Secondary | ICD-10-CM

## 2022-12-29 DIAGNOSIS — F322 Major depressive disorder, single episode, severe without psychotic features: Secondary | ICD-10-CM | POA: Diagnosis present

## 2022-12-29 DIAGNOSIS — Z8249 Family history of ischemic heart disease and other diseases of the circulatory system: Secondary | ICD-10-CM

## 2022-12-29 LAB — MAGNESIUM: Magnesium: 1.5 mg/dL — ABNORMAL LOW (ref 1.7–2.4)

## 2022-12-29 LAB — CBC WITH DIFFERENTIAL/PLATELET
Abs Immature Granulocytes: 0 10*3/uL (ref 0.00–0.07)
Basophils Absolute: 0 10*3/uL (ref 0.0–0.1)
Basophils Relative: 0 %
Eosinophils Absolute: 0.2 10*3/uL (ref 0.0–0.5)
Eosinophils Relative: 2 %
HCT: 40.5 % (ref 36.0–46.0)
Hemoglobin: 13.3 g/dL (ref 12.0–15.0)
Lymphocytes Relative: 18 %
Lymphs Abs: 1.9 10*3/uL (ref 0.7–4.0)
MCH: 29.4 pg (ref 26.0–34.0)
MCHC: 32.8 g/dL (ref 30.0–36.0)
MCV: 89.4 fL (ref 80.0–100.0)
Monocytes Absolute: 0.8 10*3/uL (ref 0.1–1.0)
Monocytes Relative: 8 %
Neutro Abs: 7.6 10*3/uL (ref 1.7–7.7)
Neutrophils Relative %: 72 %
Platelets: 293 10*3/uL (ref 150–400)
RBC: 4.53 MIL/uL (ref 3.87–5.11)
RDW: 14 % (ref 11.5–15.5)
WBC: 10.5 10*3/uL (ref 4.0–10.5)
nRBC: 0 % (ref 0.0–0.2)

## 2022-12-29 LAB — COMPREHENSIVE METABOLIC PANEL
ALT: 19 U/L (ref 0–44)
AST: 16 U/L (ref 15–41)
Albumin: 4 g/dL (ref 3.5–5.0)
Alkaline Phosphatase: 70 U/L (ref 38–126)
Anion gap: 13 (ref 5–15)
BUN: 21 mg/dL — ABNORMAL HIGH (ref 6–20)
CO2: 27 mmol/L (ref 22–32)
Calcium: 9.6 mg/dL (ref 8.9–10.3)
Chloride: 98 mmol/L (ref 98–111)
Creatinine, Ser: 0.91 mg/dL (ref 0.44–1.00)
GFR, Estimated: >60 mL/min (ref >60)
Glucose, Bld: 99 mg/dL (ref 70–99)
Potassium: 3.5 mmol/L (ref 3.5–5.1)
Sodium: 138 mmol/L (ref 135–145)
Total Bilirubin: 0.7 mg/dL (ref 0.3–1.2)
Total Protein: 7.2 g/dL (ref 6.5–8.1)

## 2022-12-29 LAB — POCT URINE DRUG SCREEN - MANUAL ENTRY (I-SCREEN)
POC Amphetamine UR: NOT DETECTED
POC Buprenorphine (BUP): NOT DETECTED
POC Cocaine UR: NOT DETECTED
POC Marijuana UR: NOT DETECTED
POC Methadone UR: NOT DETECTED
POC Methamphetamine UR: NOT DETECTED
POC Morphine: NOT DETECTED
POC Oxazepam (BZO): POSITIVE — AB
POC Oxycodone UR: NOT DETECTED
POC Secobarbital (BAR): NOT DETECTED

## 2022-12-29 LAB — TSH: TSH: 1.163 u[IU]/mL (ref 0.350–4.500)

## 2022-12-29 LAB — HEMOGLOBIN A1C
Hgb A1c MFr Bld: 5.9 % — ABNORMAL HIGH (ref 4.8–5.6)
Mean Plasma Glucose: 122.63 mg/dL

## 2022-12-29 LAB — URINALYSIS, COMPLETE (UACMP) WITH MICROSCOPIC
Bacteria, UA: NONE SEEN
Bilirubin Urine: NEGATIVE
Glucose, UA: NEGATIVE mg/dL
Hgb urine dipstick: NEGATIVE
Ketones, ur: NEGATIVE mg/dL
Nitrite: NEGATIVE
Protein, ur: NEGATIVE mg/dL
Specific Gravity, Urine: 1.025 (ref 1.005–1.030)
pH: 5 (ref 5.0–8.0)

## 2022-12-29 LAB — LIPID PANEL
Cholesterol: 164 mg/dL (ref 0–200)
HDL: 44 mg/dL (ref >40)
LDL Cholesterol: 65 mg/dL (ref 0–99)
Total CHOL/HDL Ratio: 3.7 RATIO
Triglycerides: 273 mg/dL — ABNORMAL HIGH (ref <150)
VLDL: 55 mg/dL — ABNORMAL HIGH (ref 0–40)

## 2022-12-29 LAB — ETHANOL: Alcohol, Ethyl (B): <10 mg/dL (ref <10)

## 2022-12-29 MED ORDER — HALOPERIDOL LACTATE 5 MG/ML IJ SOLN: 5.0000 mg | Freq: Three times a day (TID) | INTRAMUSCULAR | Status: DC | PRN

## 2022-12-29 MED ORDER — GLIMEPIRIDE 2 MG PO TABS: 1.0000 mg | ORAL_TABLET | Freq: Every day | ORAL | Status: DC

## 2022-12-29 MED ORDER — LOSARTAN POTASSIUM-HCTZ 50-12.5 MG PO TABS: 1.0000 | ORAL_TABLET | Freq: Every day | ORAL | Status: DC

## 2022-12-29 MED ORDER — NORTRIPTYLINE HCL 10 MG PO CAPS
10.0000 mg | ORAL_CAPSULE | Freq: Every day | ORAL | Status: DC
  Administered 2022-12-30 – 2022-12-31 (×3): 10 mg via ORAL
  Filled 2022-12-29 (×3): qty 1

## 2022-12-29 MED ORDER — TRAZODONE HCL 50 MG PO TABS: 50.0000 mg | ORAL_TABLET | Freq: Every evening | ORAL | Status: DC | PRN

## 2022-12-29 MED ORDER — HYDROCHLOROTHIAZIDE 12.5 MG PO TABS: 12.5000 mg | ORAL_TABLET | Freq: Every day | ORAL | Status: DC

## 2022-12-29 MED ORDER — METFORMIN HCL ER 500 MG PO TB24: 500.0000 mg | ORAL_TABLET | Freq: Two times a day (BID) | ORAL | Status: DC

## 2022-12-29 MED ORDER — METOPROLOL TARTRATE 50 MG PO TABS
50.0000 mg | ORAL_TABLET | Freq: Two times a day (BID) | ORAL | Status: DC
  Administered 2022-12-29: 50 mg via ORAL
  Filled 2022-12-29: qty 1

## 2022-12-29 MED ORDER — LORAZEPAM 2 MG PO TABS: 2.0000 mg | ORAL_TABLET | Freq: Three times a day (TID) | ORAL | Status: DC | PRN

## 2022-12-29 MED ORDER — SEMAGLUTIDE 14 MG PO TABS: 14.0000 mg | ORAL_TABLET | Freq: Every day | ORAL | Status: DC

## 2022-12-29 MED ORDER — METOPROLOL TARTRATE 25 MG PO TABS
50.0000 mg | ORAL_TABLET | Freq: Two times a day (BID) | ORAL | Status: DC
  Administered 2022-12-30 – 2023-01-01 (×4): 50 mg via ORAL
  Filled 2022-12-29 (×5): qty 2

## 2022-12-29 MED ORDER — HYDROCHLOROTHIAZIDE 12.5 MG PO TABS
12.5000 mg | ORAL_TABLET | Freq: Every day | ORAL | Status: DC
  Administered 2022-12-30 – 2023-01-01 (×3): 12.5 mg via ORAL
  Filled 2022-12-29 (×3): qty 1

## 2022-12-29 MED ORDER — VENLAFAXINE HCL ER 75 MG PO CP24: 225.0000 mg | ORAL_CAPSULE | Freq: Every day | ORAL | Status: DC

## 2022-12-29 MED ORDER — NORTRIPTYLINE HCL 10 MG PO CAPS: 10.0000 mg | ORAL_CAPSULE | Freq: Every day | ORAL | Status: DC

## 2022-12-29 MED ORDER — METFORMIN HCL ER 500 MG PO TB24
500.0000 mg | ORAL_TABLET | Freq: Two times a day (BID) | ORAL | Status: DC
  Administered 2022-12-30 – 2023-01-01 (×5): 500 mg via ORAL
  Filled 2022-12-29 (×6): qty 1

## 2022-12-29 MED ORDER — VENLAFAXINE HCL ER 75 MG PO CP24
225.0000 mg | ORAL_CAPSULE | Freq: Every day | ORAL | Status: DC
  Administered 2022-12-30 – 2023-01-01 (×3): 225 mg via ORAL
  Filled 2022-12-29 (×3): qty 3

## 2022-12-29 MED ORDER — PANTOPRAZOLE SODIUM 40 MG PO TBEC: 80.0000 mg | DELAYED_RELEASE_TABLET | Freq: Every day | ORAL | Status: DC

## 2022-12-29 MED ORDER — ALPRAZOLAM 0.5 MG PO TABS
1.0000 mg | ORAL_TABLET | Freq: Every evening | ORAL | Status: DC | PRN
  Administered 2022-12-29 – 2022-12-31 (×3): 1 mg via ORAL
  Filled 2022-12-29 (×3): qty 2

## 2022-12-29 MED ORDER — ACETAMINOPHEN 325 MG PO TABS
650.0000 mg | ORAL_TABLET | Freq: Four times a day (QID) | ORAL | Status: DC | PRN
  Administered 2022-12-30: 650 mg via ORAL
  Filled 2022-12-29: qty 2

## 2022-12-29 MED ORDER — ATORVASTATIN CALCIUM 40 MG PO TABS: 80.0000 mg | ORAL_TABLET | Freq: Every day | ORAL | Status: DC

## 2022-12-29 MED ORDER — ALPRAZOLAM 0.5 MG PO TABS: 1.0000 mg | ORAL_TABLET | Freq: Every evening | ORAL | Status: DC | PRN

## 2022-12-29 MED ORDER — MAGNESIUM HYDROXIDE 400 MG/5ML PO SUSP: 30.0000 mL | Freq: Every day | ORAL | Status: DC | PRN

## 2022-12-29 MED ORDER — ATORVASTATIN CALCIUM 20 MG PO TABS
80.0000 mg | ORAL_TABLET | Freq: Every day | ORAL | Status: DC
  Administered 2022-12-30 – 2023-01-01 (×3): 80 mg via ORAL
  Filled 2022-12-29 (×3): qty 4

## 2022-12-29 MED ORDER — ALUM & MAG HYDROXIDE-SIMETH 200-200-20 MG/5ML PO SUSP: 30.0000 mL | ORAL | Status: DC | PRN

## 2022-12-29 MED ORDER — HYDROXYZINE HCL 25 MG PO TABS
25.0000 mg | ORAL_TABLET | Freq: Three times a day (TID) | ORAL | Status: DC | PRN
  Administered 2022-12-31: 25 mg via ORAL
  Filled 2022-12-29: qty 1

## 2022-12-29 MED ORDER — PANTOPRAZOLE SODIUM 40 MG PO TBEC
80.0000 mg | DELAYED_RELEASE_TABLET | Freq: Every day | ORAL | Status: DC
  Administered 2022-12-30 – 2023-01-01 (×3): 80 mg via ORAL
  Filled 2022-12-29 (×3): qty 2

## 2022-12-29 MED ORDER — ACETAMINOPHEN 325 MG PO TABS: 650.0000 mg | ORAL_TABLET | Freq: Four times a day (QID) | ORAL | Status: DC | PRN

## 2022-12-29 MED ORDER — LORAZEPAM 2 MG/ML IJ SOLN: 2.0000 mg | Freq: Three times a day (TID) | INTRAMUSCULAR | Status: DC | PRN

## 2022-12-29 MED ORDER — DIPHENHYDRAMINE HCL 50 MG/ML IJ SOLN: 50.0000 mg | Freq: Three times a day (TID) | INTRAMUSCULAR | Status: DC | PRN

## 2022-12-29 MED ORDER — LOSARTAN POTASSIUM 50 MG PO TABS
50.0000 mg | ORAL_TABLET | Freq: Every day | ORAL | Status: DC
  Administered 2022-12-30 – 2023-01-01 (×3): 50 mg via ORAL
  Filled 2022-12-29 (×3): qty 1

## 2022-12-29 MED ORDER — HALOPERIDOL 5 MG PO TABS: 5.0000 mg | ORAL_TABLET | Freq: Three times a day (TID) | ORAL | Status: DC | PRN

## 2022-12-29 MED ORDER — GLIMEPIRIDE 1 MG PO TABS
1.0000 mg | ORAL_TABLET | Freq: Every day | ORAL | Status: DC
  Administered 2022-12-30 – 2023-01-01 (×3): 1 mg via ORAL
  Filled 2022-12-29 (×3): qty 1

## 2022-12-29 MED ORDER — DIPHENHYDRAMINE HCL 25 MG PO CAPS: 50.0000 mg | ORAL_CAPSULE | Freq: Three times a day (TID) | ORAL | Status: DC | PRN

## 2022-12-29 MED ORDER — HYDROXYZINE HCL 25 MG PO TABS: 25.0000 mg | ORAL_TABLET | Freq: Three times a day (TID) | ORAL | Status: DC | PRN

## 2022-12-29 MED ORDER — LOSARTAN POTASSIUM 50 MG PO TABS: 50.0000 mg | ORAL_TABLET | Freq: Every day | ORAL | Status: DC

## 2022-12-29 NOTE — BH Assessment (Signed)
Comprehensive Clinical Assessment (CCA) Note  12/29/2022 Morgan [name] [id number]  Disposition: CCA completed by this clinician. MSE completed by Vernard Gambles, NP who recommends inpatient admission.  The patient demonstrates the following risk factors for suicide: Chronic risk factors for suicide include: psychiatric disorder of anxiety and depression. . Acute risk factors for suicide include: unemployment and loss (financial, interpersonal, professional). Protective factors for this patient include: hope for the future. Considering these factors, the overall suicide risk at this point appears to be moderate. Patient is not appropriate for outpatient follow up.   Chief Complaint:  Chief Complaint  Patient presents with   Depression   Visit Diagnosis: Major depressive disorder, Recurrent episode, Severe  Morgan [name] is a 57 year old single female who presents voluntarily to [facility] with complaints of increasing depression and anxiety. Patient reports a history of depression and anxiety. Patient states it worsened when she was fired from her job on May 9, due to mumbling under her breath "if I had a gun I would kill him," in reference to her supervisor. Patient says she know she should not have said it. Patient reported having an intrusive thought today that if she had a gun, she would use it or drive into a lake. Patient acknowledges depressive symptoms to include hopelessness, lack of sleep and decreased motivation. Patient denies previous suicide attempts or history of self-harming behaviors. Patient denies HI, auditory or visual hallucinations. Patient reports socially drinking and denies additional substance use.  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 Patient says he owned the home and due to her not liking his wife, he told her she should  move out. Additionally, patient has loss her sister, dad, other brother, and stepmother over the past ten years. Patient currently lives alone, identifying her best friends and sister as supports. Patient denies any history of abuse or traumas. Patient denies legal problems.   Patient is currently receiving medication management from Cruz Condon, PA-C. Patient is prescribed Xanax and states she is compliant. Patient denies any previous inpatient hospitalizations.   Patient presents dressed casually, alert, and oriented x4. Patient has a flat affect, and her mood is depressed. Patient makes minimum eye contact and sits with her head in her arm, on the table. Patient's thought process is coherent and there is no indication she is responding to internal stimuli. Patient was cooperative throughout the assessment.   CCA Screening, Triage and Referral (STR)  Patient Reported Information How did you hear about [place]? DSS  What Is the Reason for Your Visit/Call Today? Pt presents to [facility] voluntarily due to increased anxiety and depression symptoms. Pt reports being fired from her job on May 9th, and difficulty finding employment. Pt reports an intrusive thought today after becoming frustrated trying to fill out unemployment "If I had a gun I would use it or drive into a lake, I wouldn't really do it because that would scare me". Pt denies past suicide attempts. Pt reports she was overwhelmed because she was fired because she threatened her boss because she was upset.  Pt reports she is diagnosed with depression and is prescribed Venlafaxine 150mg , Alprazolam 1mg , Nortriptyline hcl 10mg .  Pt denies HI and AVH.  How Long Has This Been Causing You Problems? <Week  What Do You Feel Would Help You the Most Today? Treatment for Depression or other  mood problem   Have You Recently Had Any Thoughts About Hurting Yourself? Yes  Are You Planning to Commit Suicide/Harm Yourself At This time? No   Flowsheet Row  ED from 12/29/2022 in [facility] ED to Hosp-Admission (Discharged) from 09/22/2022 in [facility] UN[facility] OP Visit from 05/27/2022 [facility]  C-SSRS RISK CATEGORY Moderate Risk No Risk No Risk       Have you Recently Had Thoughts About Hurting Someone [name]? No  Are You Planning to Harm Someone at This Time? No  Explanation: N/A   Have You Used Any Alcohol or Drugs in the Past 24 Hours? Yes  What Did You Use and How Much? 6 sips of liquor   Do You Currently Have a Therapist/Psychiatrist? Yes  Name of Therapist/Psychiatrist: Name of Therapist/Psychiatrist: Psychiatrist Cruz Condon, PA-C   Have You Been Recently Discharged From Any Office Practice or Programs? No  Explanation of Discharge From Practice/Program: N/A     CCA Screening Triage Referral Assessment Type of Contact: Face-to-Face  Telemedicine Service Delivery:   Is this Initial or Reassessment?   Date Telepsych consult ordered in [facility]:    Time Telepsych consult ordered in [facility]:    Location of Assessment: [facility] Assessment Services  Provider Location: [facility] Assessment Services   Collateral Involvement: None   Does Patient Have a Automotive engineer Guardian? No  Legal Guardian Contact Information: N/A  Copy of Legal Guardianship Form: -- (N/A)  Legal Guardian Notified of Arrival: -- (N/A)  Legal Guardian Notified of Pending Discharge: -- (N/A)  If Minor and Not Living with Parent(s), Who has Custody? N/A  Is CPS involved or ever been involved? Never  Is APS involved or ever been involved? Never   Patient Determined To Be At Risk for Harm To Self or Others Based on Review of Patient Reported Information or Presenting Complaint? Yes, for Self-Harm (Denies HI.)  Method: Plan without intent (Denies HI.)  Availability of Means: Has close by (Denies HI.)  Intent: Vague intent or NA (Denies  HI.)  Notification Required: No need or identified person (Denies HI.)  Additional Information for Danger to Others Potential: -- (N/A)  Additional Comments for Danger to Others Potential: N/A  Are There Guns or Other Weapons in Your Home? No (Reports she knows people with guns.)  Types of Guns/Weapons: N/A  Are These Weapons Safely Secured?                            -- (N/A)  Who Could Verify You Are Able To Have These Secured: N/A  Do You Have any Outstanding Charges, Pending Court Dates, Parole/Probation? Patient denies.  Contacted To Inform of Risk of Harm To Self or Others: -- (N/A)    Does Patient Present under Involuntary Commitment? No    [place] of Residence: [address]   Patient Currently Receiving the Following Services: Medication Management   Determination of Need: Urgent (48 hours)   Options For Referral: Outpatient Therapy; Medication Management; Inpatient Hospitalization     CCA Biopsychosocial Patient Reported Schizophrenia/Schizoaffective Diagnosis in Past: No   Strengths: Patient is connected to medication managment.   Mental Health Symptoms Depression:   Hopelessness; Change in energy/activity; Sleep (too much or little); Tearfulness   Duration of Depressive symptoms:  Duration of Depressive Symptoms: Greater than two weeks   Mania:   None   Anxiety:  None   Psychosis:   None   Duration of Psychotic symptoms:    Trauma:   None   Obsessions:   None   Compulsions:   None   Inattention:   None   Hyperactivity/Impulsivity:   None   Oppositional/Defiant Behaviors:   None   Emotional Irregularity:   None   Other Mood/Personality Symptoms:   N/A    Mental Status Exam Appearance and self-care  Stature:   Small   Weight:   Average weight   Clothing:   Disheveled   Grooming:   Normal   Cosmetic use:   None   Posture/gait:   Slumped   Motor activity:   Not Remarkable   Sensorium  Attention:    Normal   Concentration:   Normal   Orientation:   X5   Recall/memory:   Normal   Affect and Mood  Affect:   Flat; Depressed   Mood:   Depressed   Relating  Eye contact:   Fleeting   Facial expression:   Sad   Attitude toward examiner:   Cooperative   Thought and Language  Speech flow:  Normal   Thought content:   Appropriate to Mood and Circumstances   Preoccupation:   None   Hallucinations:   None   Organization:   Coherent   [employer] of Knowledge:   Average   Intelligence:   Average   Abstraction:   Normal   Judgement:   Normal   Reality Testing:   Adequate   Insight:   Good   Decision Making:   Normal   Social Functioning  Social Maturity:   Responsible   Social Judgement:   Normal   Stress  Stressors:   Surveyor, quantity; Work; Grief/losses   Coping Ability:   Overwhelmed   Skill Deficits:   None   Supports:   Friends/Service system; Family     Religion: Religion/Spirituality Are You A Religious Person?: Yes What is Your Religious Affiliation?: Non-Denominational How Might This Affect Treatment?: N/A  Leisure/Recreation: Leisure / Recreation Do You Have Hobbies?: No  Exercise/Diet: Exercise/Diet Do You Exercise?: No Have You Gained or Lost A Significant Amount of Weight in the Past Six Months?: No Do You Follow a Special Diet?: No Do You Have Any Trouble Sleeping?: Yes Explanation of Sleeping Difficulties: Patient reports 3-4 hours per night.   CCA Employment/Education Employment/Work Situation: Employment / Work Situation Employment Situation: Unemployed Patient's Job has Been Impacted by Current Illness: No Describe how Patient's Job has Been Impacted: N/A Has Patient ever Been in the [employer]?: No  Education: Education Is Patient Currently Attending School?: No Last Grade Completed: 16 Did You Attend College?: Yes What Type of College Degree Do you Have?: Bachelors in  theology. Did You Have An Individualized Education Program (IIEP): No Did You Have Any Difficulty At School?: No Patient's Education Has Been Impacted by Current Illness: No   CCA Family/Childhood History Family and Relationship History: Family history Marital status: Single Does patient have children?: No  Childhood History:  Childhood History By whom was/is the patient raised?: Both parents Did patient suffer any verbal/emotional/physical/sexual abuse as a child?: No Did patient suffer from severe childhood neglect?: No Has patient ever been sexually abused/assaulted/raped as an adolescent or adult?: No Was the patient ever a victim of a crime or a disaster?: No Witnessed domestic violence?: No Has patient been affected by domestic violence as an adult?: No       CCA Substance Use  Alcohol/Drug Use: Alcohol / Drug Use Pain Medications: See MAR Prescriptions: See MAR Over the Counter: See MAR History of alcohol / drug use?: No history of alcohol / drug abuse Longest period of sobriety (when/how long): N/A Negative Consequences of Use:  (N/A) Withdrawal Symptoms:  (N/A)                         ASAM's:  Six Dimensions of Multidimensional Assessment  Dimension 1:  Acute Intoxication and/or Withdrawal Potential:      Dimension 2:  Biomedical Conditions and Complications:      Dimension 3:  Emotional, Behavioral, or Cognitive Conditions and Complications:     Dimension 4:  Readiness to Change:     Dimension 5:  Relapse, Continued use, or Continued Problem Potential:     Dimension 6:  Recovery/Living Environment:     ASAM Severity Score:    ASAM Recommended Level of Treatment:     Substance use Disorder (SUD)    Recommendations for Services/Supports/Treatments:    Discharge Disposition:    DSM5 Diagnoses: Patient Active Problem List   Diagnosis Date Noted   MDD (major depressive disorder), recurrent episode, severe (HCC) 12/29/2022   History of  ovarian cancer in adulthood 12/15/2022   Campylobacter diarrhea 09/24/2022   Hypokalemia 09/22/2022   Actinic skin damage 09/22/2022   Acute gastroenteritis 09/22/2022   Carpal tunnel syndrome, left upper limb 08/26/2021   Carpal tunnel syndrome, right upper limb 08/26/2021   Carpal tunnel syndrome on both sides 08/02/2021   Asthma 01/09/2021   Vitamin B12 deficiency 09/05/2020   Dyslipidemia 09/04/2020   Neuropathy 09/04/2020   Mixed dyslipidemia 09/04/2020   COVID-19 virus infection 06/19/2020   Type 2 diabetes mellitus with diabetic polyneuropathy, without long-term current use of insulin (HCC) 08/25/2019   Type 2 diabetes mellitus with hyperglycemia, without long-term current use of insulin (HCC) 02/17/2019   Anxiety and depression 02/17/2019   BMI 45.0-49.9, adult (HCC) 02/04/2019   Ringing in ears, bilateral 12/29/2017   OSA on CPAP 01/06/2017   Witnessed episode of apnea 07/03/2016   Diverticulitis of colon 11/30/2014   IBS (irritable bowel syndrome) 04/21/2013   Chronic cough 11/26/2011   External hemorrhoids 04/04/2010   UNSPECIFIED THROMBOSED HEMORRHOIDS 01/08/2009   GERD 08/23/2008   Diabetes mellitus without complication (HCC) 01/22/2007   Hyperlipidemia 01/22/2007   Essential hypertension 01/22/2007     Referrals to Alternative Service(s): Referred to Alternative Service(s):   Place:   Date:   Time:    Referred to Alternative Service(s):   Place:   Date:   Time:    Referred to Alternative Service(s):   Place:   Date:   Time:    Referred to Alternative Service(s):   Place:   Date:   Time:     Cleda Clarks, LCSW

## 2022-12-29 NOTE — Discharge Instructions (Addendum)
Transfer patient to [facility] for inpatient psychiatric treatment, Dr. Marlou Porch is the accepting MD

## 2022-12-29 NOTE — ED Provider Notes (Signed)
Behavioral Health Urgent Care Medical Screening Exam  Patient Name: Morgan [name] MRN: [id number] Date of Evaluation: 12/29/22 Chief Complaint:  Increased depression and suicidal ideations. Diagnosis:  Final diagnoses:  Severe episode of recurrent major depressive disorder, without psychotic features (HCC)    History of Present illness: Morgan [name] is a 57 y.o. female patient presented to [facility] as a walk in alone with increased depression and suicidal ideations.  Morgan [name], 57 y.o., female patient seen face to face by this provider and chart reviewed on 12/29/22.  Per chart review patient had services in place with Cruz Condon PA-C for medication management.  She has a past psychiatric history of anxiety and depression.  She has a medical history that includes type 2 diabetes, asthma, and sleep apnea.  Patient uses a CPAP machine at night.  She is prescribed Xanax 1 mg QHS. Lipitor 80 mg tablet nightly, glipizide 1 mg daily, blood sugar checks twice daily, losartan-hydrochlorothiazide 50-12.5 mg tablet daily, metformin (Glucophage-XR (500 mg twice daily, metoprolol tartrate 50 mg twice daily, nortriptyline 10 mg tablet nightly, Prilosec 40 mg twice daily,Rybelsus 14 mg tablet daily, Effexor 225 mg daily (increased as of this am).  She reports compliance with medications.  Patient lives alone.  She is not married and has no children.  She is currently unemployed.  She endorses occasional alcohol use but denies all other substance use.  She denies any previous suicide attempts.  She denies any previous inpatient psychiatric admissions.  She has a few friends that she talks to but outside of that she has no support in the area.  On evaluation Morgan [name] reports she had fired from her job on May 9.  After communicating threats.  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  She  went to HR to complain and while she was in there under her breath she stated that, "if I had a gun I would shoot him".  Reports she did not mean it she said anger.  However HR reported incident and she was fired.  She has been very depressed since that time worrying about bills and trying to obtain a new job.  Reports today her thoughts became so intrusive that she began to have the thought of, "if I had a gun I would use it to kill myself or I would drive into the lake".  She denies any previous suicide attempts but reports over a year ago she did have a passive suicidal thought.  She reports access to firearms.  States all of her friends own firearms and it would be easy for her to obtain one.  She continues to endorse suicidal ideations and cannot contract for safety.  When asked if she would act on these thoughts she states, "I do not know, maybe".  During evaluation Morgan [name] is observed sitting in the assessment room in no acute distress.  She is disheveled and makes fleeting eye contact.  She has normal speech but at times it is pressured and loud.  She is alert/oriented x 4, cooperative, and fairly attentive.  She endorses depression with feelings of helplessness, hopelessness, decreased motivation, decreased focus, decreased appetite and sleep.  She is tearful at times throughout the assessment.  Reports she is having difficulty "turning my brain off at night" as she is only getting roughly 3 hours of sleep.  She denies any homicidal  ideations.  She denies any auditory/visual hallucinations.Patient is able to converse coherently, goal directed thoughts, no distractibility, or pre-occupation.  She does not appear psychotic or manic.  Patient answered question appropriately.    Discussed inpatient psychiatric admission with patient and she is in agreement.  Flowsheet Row ED from 12/29/2022 in [facility] ED to Hosp-Admission (Discharged) from 09/22/2022 in [facility]ICAL UNIT OP Visit from 05/27/2022 in [facility]  C-SSRS RISK CATEGORY No Risk No Risk No Risk       Psychiatric Specialty Exam  Presentation  General Appearance:Disheveled  Eye Contact:Fleeting  Speech:Clear and Coherent; Normal Rate (pressured at times)  Speech Volume:Normal (increased at times)  Handedness:Right   Mood and Affect  Mood: Anxious; Depressed  Affect: Tearful; Congruent   Thought Process  Thought Processes: Coherent  Descriptions of Associations:Intact  Orientation:Full (Time, Place and Person)  Thought Content:Logical    Hallucinations:None  Ideas of Reference:None  Suicidal Thoughts:Yes, Active Without Intent; With Plan; With Means to Carry Out  Homicidal Thoughts:No   Sensorium  Memory: Immediate Good; Recent Good; Remote Good  Judgment: Impaired  Insight: Lacking   Executive Functions  Concentration: Fair  Attention Span: Fair  Recall: [employer] of Knowledge: Fair  Language: Fair   Psychomotor Activity  Psychomotor Activity: Normal   Assets  Assets: Health and safety inspector; Housing; Physical Health; Resilience; Leisure Time   Sleep  Sleep: Poor  Number of hours:  3   Physical Exam: Physical Exam Vitals and nursing note reviewed.  Constitutional:      General: She is not in acute distress.    Appearance: Normal appearance. She is not ill-appearing.  HENT:     Head: Normocephalic.  Eyes:     General:        Right eye: No discharge.        Left eye: No discharge.  Cardiovascular:     Rate and Rhythm: Normal rate.  Pulmonary:     Effort: Pulmonary effort is normal. No respiratory distress.  Musculoskeletal:        General: Normal range of motion.     Cervical back: Normal range of motion.  Skin:    Coloration: Skin is not jaundiced or pale.  Neurological:     Mental Status: She is alert and oriented to person,  place, and time.  Psychiatric:        Attention and Perception: Perception normal.        Mood and Affect: Mood is anxious and depressed. Affect is tearful.        Speech: Speech normal.        Behavior: Behavior is cooperative.        Thought Content: Thought content includes suicidal ideation. Thought content includes suicidal plan.        Cognition and Memory: Cognition normal.        Judgment: Judgment is impulsive.    Review of Systems  Constitutional: Negative.   HENT: Negative.    Eyes: Negative.   Respiratory: Negative.    Cardiovascular: Negative.   Musculoskeletal: Negative.   Skin: Negative.   Neurological: Negative.   Psychiatric/Behavioral:  Positive for depression and suicidal ideas. The patient is nervous/anxious.    Blood pressure (!) 118/90, pulse 78, temperature 98.2 F (36.8 C), temperature source Oral, resp. rate 18, last menstrual period 03/22/1986, SpO2 99 %. There is no height or weight on file to calculate BMI.  Musculoskeletal: Strength &  Muscle Tone: within normal limits Gait & Station: normal Patient leans: N/A   [facility] MSE Discharge Disposition for Follow up and Recommendations: Based on my evaluation I certify that psychiatric inpatient services furnished can reasonably be expected to improve the patient's condition which I recommend transfer to an appropriate accepting facility.   Patient meets criteria for inpatient psychiatric admission.  [facility] notified and patient has been accepted to [facility] for today.  Medications:  Restart home medications Effexor to 25 mg a.m., Rybelsus 14 mg daily, alprazolam 1 mg nightly, metoprolol tartrate 50 mg twice daily, atorvastatin 80 mg daily, losartan hydrochlorothiazide 50/12.5 mg daily, metformin ER 500 mg twice daily, omeprazole 40 mg twice daily, nortriptyline 10 mg daily, glipizide every morning.  Lab Orders         CBC with Differential/Platelet         Comprehensive metabolic  panel         Hemoglobin A1c         Magnesium         Ethanol         Lipid panel         TSH         Urinalysis, Complete w Microscopic -Urine, Clean Catch         POCT Urine Drug Screen - (I-Screen)      EKG   Ardis Hughs, NP 12/29/2022, 6:34 PM

## 2022-12-29 NOTE — ED Notes (Addendum)
Report given to Shelia Media, RN.

## 2022-12-29 NOTE — ED Notes (Signed)
Safe transport called 

## 2022-12-29 NOTE — Progress Notes (Addendum)
   12/29/22 1800  [facility] Triage Screening (Walk-ins at [facility] only)  How Did You Hear About [place]? Self  What Is the Reason for Your Visit/Call Today? Pt presents to [facility] voluntarily due to increased anxiety and depression symptoms. Pt reports being fired from her job on May 9th, and difficulty finding employment. Pt reports an intrusive thought today after becoming frustrated trying to fill out unemployment "If I had a gun I would use it or drive into a lake, I wouldn't really do it because that would scare me". Pt denies past suicide attempts. Pt reports she was overwhelmed because she was fired because she threatened her boss because she was upset.  Pt reports she is diagnosed with depression and is prescribed Venlafaxine 150mg , Alprazolam 1mg , Nortriptyline hcl 10mg .  Pt denies HI and AVH.  How Long Has This Been Causing You Problems? <Week  Have You Recently Had Any Thoughts About Hurting Yourself? Yes  How long ago did you have thoughts about hurting yourself? no clear plan or intent, intrusive thoughts  Are You Planning to Commit Suicide/Harm Yourself At This time? No  Have you Recently Had Thoughts About Hurting Someone [name]? No  Are You Planning To Harm Someone At This Time? No  Are you currently experiencing any auditory, visual or other hallucinations? No  Have You Used Any Alcohol or Drugs in the Past 24 Hours? Yes  How long ago did you use Drugs or Alcohol? yesterday  What Did You Use and How Much? 6 sips of liquor  Do you have any current medical co-morbidities that require immediate attention? No  Clinician description of patient physical appearance/behavior: tearful, nervous  What Do You Feel Would Help You the Most Today? Treatment for Depression or other mood problem  If access to [facility] was not available, would you have sought care in the Emergency Department? No  Determination of Need Urgent (48 hours)  Options For Referral Outpatient Therapy;Medication Management

## 2022-12-30 ENCOUNTER — Encounter: Payer: Self-pay | Admitting: Psychiatry

## 2022-12-30 ENCOUNTER — Other Ambulatory Visit: Payer: Self-pay

## 2022-12-30 DIAGNOSIS — F332 Major depressive disorder, recurrent severe without psychotic features: Principal | ICD-10-CM

## 2022-12-30 LAB — GLUCOSE, CAPILLARY: Glucose-Capillary: 133 mg/dL — ABNORMAL HIGH (ref 70–99)

## 2022-12-30 NOTE — Tx Team (Signed)
Initial Treatment Plan 12/30/2022 12:17 AM Morgan [name] ZOX:[id number]    PATIENT STRESSORS: Financial difficulties   Marital or family conflict     PATIENT STRENGTHS: Ability for insight  Motivation for treatment/growth    PATIENT IDENTIFIED PROBLEMS: Depression  Anger Issues  Suicidal Ideation                 DISCHARGE CRITERIA:  Improved stabilization in mood, thinking, and/or behavior Verbal commitment to aftercare and medication compliance  PRELIMINARY DISCHARGE PLAN: Outpatient therapy Return to previous living arrangement  PATIENT/FAMILY INVOLVEMENT: This treatment plan has been presented to and reviewed with the patient, Morgan [name]. The patient has been given the opportunity to ask questions and make suggestions.  Elmyra Ricks, RN 12/30/2022, 12:17 AM

## 2022-12-30 NOTE — Progress Notes (Signed)
Patient admitted from [facility], report received from [place], [place]. Pt calm and cooperative during assessment. Pt denies SI/HI/AVH. Pt stated that she has anger issues because her brother won't let her live with him and longer and she recently lost her job. She had the thought of using a gun to end her life but doesn't think she could actually go through with it. Pt verbally contracts for safety with this Clinical research associate. Pt skin assessment completed with Marylu Lund, RN, no abnormalities or contraband found. Pt given education, support, and encouragement to be active in her in treatment plan. Pt being monitored Q 15 minutes for safety per unit protocol, remains safe on the unit

## 2022-12-30 NOTE — Group Note (Signed)
Date:  12/30/2022 Time:  2:00 PM  Group Topic/Focus:  Healthy Communication:   The focus of this group is to discuss communication, barriers to communication, as well as healthy ways to communicate with others.  Community Group   Participation Level:  Minimal  Participation Quality:  Attentive  Affect:  Appropriate  Cognitive:  Appropriate  Insight: Appropriate  Engagement in Group:  None  Modes of Intervention:  Activity, Discussion, and Support  Additional Comments:    Otilia Kareem A Erianna Jolly 12/30/2022, 5:05 PM

## 2022-12-30 NOTE — Progress Notes (Signed)
Patient ID: Morgan [name], female   DOB: 05-13-1966, 57 y.o.   MRN: [id number]  Patient may have her CPAP machine. She does not require a 1:1. RH

## 2022-12-30 NOTE — Progress Notes (Signed)
Suicide Risk Assessment  Admission Assessment    [facility] Admission Suicide Risk Assessment   Nursing information obtained from:  Patient Demographic factors:  Low socioeconomic status, Living alone, Unemployed Current Mental Status:  Suicidal ideation indicated by patient Loss Factors:  Decrease in vocational status, Loss of significant relationship Historical Factors:  Impulsivity Risk Reduction Factors:  Positive therapeutic relationship  Total Time spent with patient: 1 hour Principal Problem: MDD (major depressive disorder), recurrent episode, severe (HCC) Diagnosis:  Principal Problem:   MDD (major depressive disorder), recurrent episode, severe (HCC) Active Problems:   MDD (major depressive disorder), severe (HCC)  Subjective Data: Pt w/ history of anxiety, depression admitted to [facility] inpatient psychiatry for worsening anxiety, depression and SI with plans in the context of multiple stressors.   Continued Clinical Symptoms:  Alcohol Use Disorder Identification Test Final Score (AUDIT): 0 The "Alcohol Use Disorders Identification Test", Guidelines for Use in Primary Care, Second Edition.  World Science writer [facility]). Score between 0-7:  no or low risk or alcohol related problems. Score between 8-15:  moderate risk of alcohol related problems. Score between 16-19:  high risk of alcohol related problems. Score 20 or above:  warrants further diagnostic evaluation for alcohol dependence and treatment.   CLINICAL FACTORS:   Severe Anxiety and/or Agitation Depression:   Hopelessness Previous Psychiatric Diagnoses and Treatments   Musculoskeletal: Strength & Muscle Tone: pt sitting on assessment Gait & Station: pt sitting on assessment Patient leans: pt sitting on assessment  Psychiatric Specialty Exam:  Presentation  General Appearance:  Appropriate for Environment  Eye Contact: Fair  Speech: Clear and Coherent; Other (comment) (overall normal rate, has periods  where she does speak rapidly/pressured)  Speech Volume: Normal  Handedness: Right   Mood and Affect  Mood: Anxious; Depressed  Affect: Full Range   Thought Process  Thought Processes: Coherent; Goal Directed; Linear  Descriptions of Associations:Intact  Orientation:Full (Time, Place and Person)  Thought Content:Logical  History of Schizophrenia/Schizoaffective disorder:No  Duration of Psychotic Symptoms:No data recorded Hallucinations:Hallucinations: None  Ideas of Reference:None  Suicidal Thoughts:Suicidal Thoughts: No SI Active Intent and/or Plan: Without Intent; With Plan; With Means to Carry Out  Homicidal Thoughts:Homicidal Thoughts: No   Sensorium  Memory: Immediate Good; Recent Good; Remote Good  Judgment: Intact  Insight: Present   Executive Functions  Concentration: Fair  Attention Span: Fair  Recall: [employer] of Knowledge: Fair  Language: Fair   Psychomotor Activity  Psychomotor Activity: Psychomotor Activity: Normal   Assets  Assets: Communication Skills; Desire for Improvement; Financial Resources/Insurance; Housing; Leisure Time; Resilience   Sleep  Sleep: Sleep: Poor Number of Hours of Sleep: 3    Physical Exam: Physical Exam see admission note ROS see admission note Blood pressure 113/78, pulse 77, temperature 97.9 F (36.6 C), temperature source Oral, resp. rate 18, height 5\' 4"  (1.626 m), weight 90.7 kg, last menstrual period 03/22/1986, SpO2 98 %. Body mass index is 34.32 kg/m.   COGNITIVE FEATURES THAT CONTRIBUTE TO RISK:  None    SUICIDE RISK:   Moderate:  Frequent suicidal ideation with limited intensity, and duration, some specificity in terms of plans, no associated intent, good self-control, limited dysphoria/symptomatology, some risk factors present, and identifiable protective factors, including available and accessible social support.  PLAN OF CARE:  Daily contact with patient to assess  and evaluate symptoms and progress in treatment Medication management (continue home medications - pt declined changes at this time, reports recent change)  I certify that inpatient services furnished  can reasonably be expected to improve the patient's condition.   Lauree Chandler, NP 12/30/2022, 4:08 PM

## 2022-12-30 NOTE — Group Note (Signed)
Recreation Therapy Group Note   Group Topic:Coping Skills  Group Date: 12/30/2022 Start Time: 1000 End Time: 1050 Facilitators: Rosina Lowenstein, LRT, CTRS Location: Courtyard  Group Description: Mind Map.  Patient was provided a blank template of a diagram with 32 blank boxes in a tiered system, branching from the center (similar to a bubble chart). LRT directed patients to label the middle of the diagram "Coping Skills". LRT and patients then came up with 8 different coping skills as examples. Pt were directed to record their coping skills in the 2nd tier boxes closest to the center.  Patients would then share their coping skills with the group as LRT wrote them out. LRT gave a handout of 100 different coping skills at the end of group.   Goal Area(s) Addressed: Patients will be able to define "coping skills". Patient will identify new coping skills.  Patient will identify new possible leisure interests.    Affect/Mood: N/A   Participation Level: Did not attend    Clinical Observations/Individualized Feedback: Patient did not attend group.  Plan: Continue to engage patient in RT group sessions 2-3x/week.   Rosina Lowenstein, LRT, CTRS 12/30/2022 11:04 AM

## 2022-12-30 NOTE — Plan of Care (Signed)
D- Patient alert and oriented. Patient presented in an anxious, but pleasant mood on assessment stating that she slept "ok, but they never brought my CPAP", and had no complaints to voice to this Clinical research associate. Patient endorsed both depression and anxiety, rating them both a "10/10", stating "being here, it's just life is hard. I got terminated". Patient also stated that her mind has been racing and she is worrying about what's going to happen when she leaves. Patient denied SI, HI, AVH, and pain at this time. Patient had no stated goals for today.  A- Scheduled medications administered to patient, per MD orders. Support and encouragement provided.  Routine safety checks conducted every 15 minutes.  Patient informed to notify staff with problems or concerns.  R- No adverse drug reactions noted. Patient contracts for safety at this time. Patient compliant with medications and treatment plan. Patient receptive, calm, and cooperative. Patient remains safe at this time.  Problem: Education: Goal: Knowledge of General Education information will improve Description: Including pain rating scale, medication(s)/side effects and non-pharmacologic comfort measures Outcome: Not Progressing   Problem: Health Behavior/Discharge Planning: Goal: Ability to manage health-related needs will improve Outcome: Not Progressing   Problem: Clinical Measurements: Goal: Ability to maintain clinical measurements within normal limits will improve Outcome: Not Progressing Goal: Will remain free from infection Outcome: Not Progressing Goal: Diagnostic test results will improve Outcome: Not Progressing Goal: Respiratory complications will improve Outcome: Not Progressing Goal: Cardiovascular complication will be avoided Outcome: Not Progressing   Problem: Activity: Goal: Risk for activity intolerance will decrease Outcome: Not Progressing   Problem: Nutrition: Goal: Adequate nutrition will be maintained Outcome: Not  Progressing   Problem: Coping: Goal: Level of anxiety will decrease Outcome: Not Progressing   Problem: Elimination: Goal: Will not experience complications related to bowel motility Outcome: Not Progressing Goal: Will not experience complications related to urinary retention Outcome: Not Progressing   Problem: Pain Managment: Goal: General experience of comfort will improve Outcome: Not Progressing   Problem: Safety: Goal: Ability to remain free from injury will improve Outcome: Not Progressing   Problem: Skin Integrity: Goal: Risk for impaired skin integrity will decrease Outcome: Not Progressing

## 2022-12-30 NOTE — Progress Notes (Signed)
This Clinical research associate held scheduled Metoprolol due to vitals.   12/30/22 1750  Vital Signs  Temp 98.4 F (36.9 C)  Temp Source Oral  Pulse Rate 78  Pulse Rate Source Monitor  BP 94/66  BP Location Right Arm  BP Method Automatic  Patient Position (if appropriate) Sitting  Oxygen Therapy  SpO2 97 %  O2 Device Room Air

## 2022-12-30 NOTE — Group Note (Signed)
Date:  12/30/2022 Time:  11:00 AM  Group Topic/Focus:  Activity Group:  The purpose of this activity group is to encourage the patients to come outside to the courtyard and get some fresh air and some exercise to contribute to their physical and mental wellbeing.    Participation Level:  Did Not Attend   Morgan [name] Morgan [name] 12/30/2022, 11:00 AM

## 2022-12-30 NOTE — H&P (Signed)
Psychiatric Admission Assessment Adult  Patient Identification: Morgan [name] MRN:  [id number] Date of Evaluation:  12/30/2022 Chief Complaint:  MDD (major depressive disorder), severe (HCC) [F32.2] Principal Diagnosis: MDD (major depressive disorder), recurrent episode, severe (HCC) Diagnosis:  Principal Problem:   MDD (major depressive disorder), recurrent episode, severe (HCC) Active Problems:   MDD (major depressive disorder), severe (HCC)  History of Present Illness:  Pt is a 57 y/o female w/ hx of MDD, GAD, hyperlipidemia, DM type 2, OSA (on CPAP), HTN admitted to [facility] inpatient psychiatry as a transfer from [facility] after presenting as a walk in unescorted with complaints of increased depression and suicidal ideations.  Pt reports history of anxiety, depression. Reports worsening anxiety, depression after she had to move her mother into a nursing facility on her own last Christmas. Reports in the last few months, she has been experiencing multiple stressors. Her brother had offered to "take care of me" and was assisting with housing when she felt her brother was "changing" asking her to take out her garbage and clean up sticks in the yard. He later told her she had to move out. She states she also lost her job after 7 years with the same company. 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 She was terminated from the company after making a comment at work that if she had a firearm she would shoot her supervisor in front of HR. She reports she presented to [facility] voluntarily because she was having thoughts that she should just get a gun and end it or drive [place] a lake. Denies she had intent at the time, although felt she should seek help. Per chart review, could not contract for safety yesterday. Reports she does not own a gun but her friends have guns.   Pt reports depressed, anxious mood, feelings of fatigue, worthlessness,  hopelessness, recurrent SI, and loss of energy/fatigue. She states she has been "beating myself up" due to thoughts she should not have made comment at work. She reports sleep has been poor, sleeping ~4 hours/night since she lost her job. She reports good appetite, eating 3 meals/day. She states she is currently on unemployment which will end in August and is also receiving food stamps.   Pt reports use of alcohol, once/week, "5 sips" of hard liquor/occasion. Denies use of marijuana, crack/cocaine, methamphetamines, opioids. UDS from 12/29/22 +oxazepam.   She denies current SI/HI, AVH, paranoia.   Pt denies history of NSSIB, SA, or inpatient psychiatric history.  She receives medication management from primary care Cruz Condon PA-C. Reports Elease Hashimoto recently increased her effexor-xr to 225mg . Has only taken 1 dose of increased dose. She states prior to seeing Elease Hashimoto she had been followed by Dr. Clent Ridges, who managed her medications. She declined any medication changes today, stating she wants to stay at the increased effexor-xr dose of 225mg  and to have medication changes from Dr. Clent Ridges or Elease Hashimoto who "knows my body".   She was agreeable for collateral to be called. Provided number for her "best friend", Vicente Serene, [phone].    Associated Signs/Symptoms: Depression Symptoms:  depressed mood, fatigue, feelings of worthlessness/guilt, hopelessness, recurrent thoughts of death, anxiety, loss of energy/fatigue, disturbed sleep, (Hypo) Manic Symptoms:   None reported Anxiety Symptoms:  Excessive Worry, Psychotic Symptoms:   None reported PTSD Symptoms: NA Total Time spent with patient: 1 hour  Past Psychiatric History: MDD, GAD  Is the patient at risk to self?  Denies current SI, however admits to experiencing SI with plans yesterday, denies intent at the time today although yesterday was unable to contract for safety Has the patient been a risk to self in the past 6 months? No.  Has the  patient been a risk to self within the distant past? No.  Is the patient a risk to others? No.  Has the patient been a risk to others in the past 6 months? No.  Has the patient been a risk to others within the distant past? No.   [place] Scale:  Flowsheet Row Admission (Current) from 12/29/2022 in [facility] INPATIENT BEHAVIORAL MEDICINE Most recent reading at 12/30/2022 12:08 AM ED from 12/29/2022 in [facility] Most recent reading at 12/29/2022 10:24 PM ED to Hosp-Admission (Discharged) from 09/22/2022 in [facility]ICAL UNIT Most recent reading at 09/22/2022  9:45 AM  C-SSRS RISK CATEGORY Low Risk No Risk No Risk        Prior Inpatient Therapy: No.  Prior Outpatient Therapy: Yes.   See HPI  Alcohol Screening: 1. How often do you have a drink containing alcohol?: Never 2. How many drinks containing alcohol do you have on a typical day when you are drinking?: 1 or 2 3. How often do you have six or more drinks on one occasion?: Never AUDIT-C Score: 0 4. How often during the last year have you found that you were not able to stop drinking once you had started?: Never 5. How often during the last year have you failed to do what was normally expected from you because of drinking?: Never 6. How often during the last year have you needed a first drink in the morning to get yourself going after a heavy drinking session?: Never 7. How often during the last year have you had a feeling of guilt of remorse after drinking?: Never 8. How often during the last year have you been unable to remember what happened the night before because you had been drinking?: Never 9. Have you or someone else been injured as a result of your drinking?: No 10. Has a relative or friend or a doctor or another health worker been concerned about your drinking or suggested you cut down?: No Alcohol Use Disorder Identification Test Final Score (AUDIT): 0 Substance Abuse History  in the last 12 months:  No. Consequences of Substance Abuse: NA Previous Psychotropic Medications: Yes  Psychological Evaluations:  Unknown Past Medical History:  Past Medical History:  Diagnosis Date   Anal fissure    saw Dr. Loretha Brasil    Anxiety    on meds   Arthritis    on meds   Asthma    uses inhaler if needed   Carpal tunnel syndrome    Depression    on meds   Diabetes mellitus    on meds   GERD (gastroesophageal reflux disease)    on meds   History of ovarian cancer in adulthood    Hyperlipidemia    on meds   Hypertension    on meds   Ovarian cancer [facility]) 1986   1996   Skin cancer (melanoma) (HCC) 1997   Sleep apnea    uses CPAP    Tubular adenoma of colon 01/2014    Past Surgical History:  Procedure Laterality Date   ABDOMINAL HYSTERECTOMY  06/30/1984   BSO   BASAL CELL CARCINOMA EXCISION  10/09/2012   lower lip per Dr. Park Liter    COLONOSCOPY  03/23/2019   per Dr. Adela Lank, adenomatous polyps, repeat in 3 yrs   GLBS tumor ovary 1986     HEMORRHOID SURGERY     HIP FRACTURE SURGERY Right 2007   MELANOMA EXCISION  07/01/1995   upper chest    Family History:  Family History  Problem Relation Age of Onset   Dementia Mother    Asthma Mother    Multiple sclerosis Father    Uterine cancer Sister 24   Diabetes Brother    Diabetes Brother    Basal cell carcinoma Brother        lower lip   Diabetes Maternal Grandfather    Colon polyps Paternal Grandmother    Stomach cancer Paternal Grandmother        dx 25s-80s   Colon polyps Paternal Grandfather    Arthritis Other    Prostate cancer Other        grandfather per pt but she is unaware of which grandfather   Coronary artery disease Other    Pancreatic cancer Neg Hx    Esophageal cancer Neg Hx    Rectal cancer Neg Hx    Family Psychiatric  History: Reports mother had "mental problems, depression" Tobacco Screening:  Social History   Tobacco Use  Smoking Status Never  Smokeless  Tobacco Never    BH Tobacco Counseling     Are you interested in Tobacco Cessation Medications?  N/A, patient does not use tobacco products Counseled patient on smoking cessation:  N/A, patient does not use tobacco products Reason Tobacco Screening Not Completed: No value filed.       Social History:  Social History   Substance and Sexual Activity  Alcohol Use No   Alcohol/week: 0.0 standard drinks of alcohol     Social History   Substance and Sexual Activity  Drug Use No    Additional Social History: Marital status: Single Are you sexually active?: No What is your sexual orientation?: Unable to assess Has your sexual activity been affected by drugs, alcohol, medication, or emotional stress?: N/A Does patient have children?: No (Pt shares due to cancer history she could not have children.)                         Allergies:   Allergies  Allergen Reactions   Lisinopril Cough   Lab Results:  Results for orders placed or performed during the hospital encounter of 12/29/22 (from the past 48 hour(s))  CBC with Differential/Platelet     Status: None   Collection Time: 12/29/22  7:35 PM  Result Value Ref Range   WBC 10.5 4.0 - 10.5 K/uL   RBC 4.53 3.87 - 5.11 MIL/uL   Hemoglobin 13.3 12.0 - 15.0 g/dL   HCT 40.9 81.1 - 91.4 %   MCV 89.4 80.0 - 100.0 fL   MCH 29.4 26.0 - 34.0 pg   MCHC 32.8 30.0 - 36.0 g/dL   RDW 78.2 95.6 - 21.3 %   Platelets 293 150 - 400 K/uL   nRBC 0.0 0.0 - 0.2 %   Neutrophils Relative % 72 %   Neutro Abs 7.6 1.7 - 7.7 K/uL   Lymphocytes Relative 18 %   Lymphs Abs 1.9 0.7 - 4.0 K/uL   Monocytes Relative 8 %   Monocytes Absolute 0.8 0.1 - 1.0 K/uL   Eosinophils Relative 2 %   Eosinophils Absolute 0.2 0.0 - 0.5 K/uL   Basophils Relative 0 %   Basophils Absolute 0.0 0.0 -  0.1 K/uL   WBC Morphology MORPHOLOGY UNREMARKABLE    RBC Morphology MORPHOLOGY UNREMARKABLE    Smear Review PLATELET COUNT CONFIRMED BY SMEAR    Abs Immature  Granulocytes 0.00 0.00 - 0.07 K/uL    Comment: Performed at [facility], [address], [place], [place] [address]  Comprehensive metabolic panel     Status: Abnormal   Collection Time: 12/29/22  7:35 PM  Result Value Ref Range   Sodium 138 135 - 145 mmol/L   Potassium 3.5 3.5 - 5.1 mmol/L   Chloride 98 98 - 111 mmol/L   CO2 27 22 - 32 mmol/L   Glucose, Bld 99 70 - 99 mg/dL    Comment: Glucose reference range applies only to samples taken after fasting for at least 8 hours.   BUN 21 (H) 6 - 20 mg/dL   Creatinine, Ser 6.04 0.44 - 1.00 mg/dL   Calcium 9.6 8.9 - 54.0 mg/dL   Total Protein 7.2 6.5 - 8.1 g/dL   Albumin 4.0 3.5 - 5.0 g/dL   AST 16 15 - 41 U/L   ALT 19 0 - 44 U/L   Alkaline Phosphatase 70 38 - 126 U/L   Total Bilirubin 0.7 0.3 - 1.2 mg/dL   GFR, Estimated >98 >11 mL/min    Comment: (NOTE) Calculated using the CKD-EPI Creatinine Equation (2021)    Anion gap 13 5 - 15    Comment: Performed at [facility], [address], [place], [place] [address]  Hemoglobin A1c     Status: Abnormal   Collection Time: 12/29/22  7:35 PM  Result Value Ref Range   Hgb A1c MFr Bld 5.9 (H) 4.8 - 5.6 %    Comment: (NOTE) Pre diabetes:          5.7%-6.4%  Diabetes:              >6.4%  Glycemic control for   <7.0% adults with diabetes    Mean Plasma Glucose 122.63 mg/dL    Comment: Performed at [facility], [address] [place], [place] [address]  Magnesium     Status: Abnormal   Collection Time: 12/29/22  7:35 PM  Result Value Ref Range   Magnesium 1.5 (L) 1.7 - 2.4 mg/dL    Comment: Performed at [facility], [address], [place], [place] [address]  Ethanol     Status: None   Collection Time: 12/29/22  7:35 PM  Result Value Ref Range   Alcohol, Ethyl (B) <10 <10 mg/dL    Comment: (NOTE) Lowest detectable limit for serum alcohol is 10 mg/dL.  For medical purposes only. Performed at [facility], [address], [place],  [place] [address]   Lipid panel     Status: Abnormal   Collection Time: 12/29/22  7:35 PM  Result Value Ref Range   Cholesterol 164 0 - 200 mg/dL   Triglycerides 962 (H) <150 mg/dL   HDL 44 >95 mg/dL   Total CHOL/HDL Ratio 3.7 RATIO   VLDL 55 (H) 0 - 40 mg/dL   LDL Cholesterol 65 0 - 99 mg/dL    Comment:        Total Cholesterol/HDL:CHD Risk Coronary Heart Disease Risk Table                     Men   Women  1/2 Average Risk   3.4   3.3  Average Risk  5.0   4.4  2 X Average Risk   9.6   7.1  3 X Average Risk  23.4   11.0        Use the calculated Patient Ratio above and the CHD Risk Table to determine the patient's CHD Risk.        ATP III CLASSIFICATION (LDL):  <100     mg/dL   Optimal  829-562  mg/dL   Near or Above                    Optimal  130-159  mg/dL   Borderline  130-865  mg/dL   High  >784     mg/dL   Very High Performed at [facility], [address], [place], [place] [address]   TSH     Status: None   Collection Time: 12/29/22  7:35 PM  Result Value Ref Range   TSH 1.163 0.350 - 4.500 uIU/mL    Comment: Performed by a 3rd Generation assay with a functional sensitivity of <=0.01 uIU/mL. Performed at [facility], [address], [place], [place] [address]   POCT Urine Drug Screen - (I-Screen)     Status: Abnormal   Collection Time: 12/29/22  7:35 PM  Result Value Ref Range   POC Amphetamine UR None Detected NONE DETECTED (Cut Off Level 1000 ng/mL)   POC Secobarbital (BAR) None Detected NONE DETECTED (Cut Off Level 300 ng/mL)   POC Buprenorphine (BUP) None Detected NONE DETECTED (Cut Off Level 10 ng/mL)   POC Oxazepam (BZO) Positive (A) NONE DETECTED (Cut Off Level 300 ng/mL)   POC Cocaine UR None Detected NONE DETECTED (Cut Off Level 300 ng/mL)   POC Methamphetamine UR None Detected NONE DETECTED (Cut Off Level 1000 ng/mL)   POC Morphine None Detected NONE DETECTED (Cut Off Level 300 ng/mL)   POC Methadone UR None Detected NONE DETECTED (Cut Off  Level 300 ng/mL)   POC Oxycodone UR None Detected NONE DETECTED (Cut Off Level 100 ng/mL)   POC Marijuana UR None Detected NONE DETECTED (Cut Off Level 50 ng/mL)  Urinalysis, Complete w Microscopic -Urine, Clean Catch     Status: Abnormal   Collection Time: 12/29/22  7:35 PM  Result Value Ref Range   Color, Urine YELLOW YELLOW   APPearance CLEAR CLEAR   Specific Gravity, Urine 1.025 1.005 - 1.030   pH 5.0 5.0 - 8.0   Glucose, UA NEGATIVE NEGATIVE mg/dL   Hgb urine dipstick NEGATIVE NEGATIVE   Bilirubin Urine NEGATIVE NEGATIVE   Ketones, ur NEGATIVE NEGATIVE mg/dL   Protein, ur NEGATIVE NEGATIVE mg/dL   Nitrite NEGATIVE NEGATIVE   Leukocytes,Ua LARGE (A) NEGATIVE   RBC / HPF 0-5 0 - 5 RBC/hpf   WBC, UA 21-50 0 - 5 WBC/hpf   Bacteria, UA NONE SEEN NONE SEEN   Squamous Epithelial / HPF 0-5 0 - 5 /HPF   Mucus PRESENT     Comment: Performed at [facility], [address] [place], [place] [address]  Glucose, capillary     Status: Abnormal   Collection Time: 12/29/22 10:45 PM  Result Value Ref Range   Glucose-Capillary 133 (H) 70 - 99 mg/dL    Comment: Glucose reference range applies only to samples taken after fasting for at least 8 hours.    Blood Alcohol level:  Lab Results  Component Value Date   [facility] <10 12/29/2022    Metabolic Disorder Labs:  Lab Results  Component Value Date   HGBA1C 5.9 (H) 12/29/2022   MPG 122.63 12/29/2022   MPG 137 08/16/2008   No results found for: "PROLACTIN" Lab Results  Component Value Date   CHOL 164 12/29/2022   TRIG 273 (H) 12/29/2022   HDL 44 12/29/2022   CHOLHDL 3.7 12/29/2022   VLDL 55 (H) 12/29/2022   LDLCALC 65 12/29/2022   LDLCALC 72 02/22/2020    Current Medications: Current Facility-Administered Medications  Medication Dose Route Frequency Provider Last Rate Last Admin   acetaminophen (TYLENOL) tablet 650 mg  650 mg Oral Q6H PRN Ardis Hughs, NP   650 mg at 12/30/22 1600   ALPRAZolam (XANAX) tablet 1 mg  1 mg  Oral QHS PRN Ardis Hughs, NP   1 mg at 12/29/22 2358   alum & mag hydroxide-simeth (MAALOX/MYLANTA) 200-200-20 MG/5ML suspension 30 mL  30 mL Oral Q4H PRN Ardis Hughs, NP       atorvastatin (LIPITOR) tablet 80 mg  80 mg Oral Daily Ardis Hughs, NP   80 mg at 12/30/22 1478   diphenhydrAMINE (BENADRYL) capsule 50 mg  50 mg Oral TID PRN Ardis Hughs, NP       Or   diphenhydrAMINE (BENADRYL) injection 50 mg  50 mg Intramuscular TID PRN Ardis Hughs, NP       glimepiride (AMARYL) tablet 1 mg  1 mg Oral Q breakfast Ardis Hughs, NP   1 mg at 12/30/22 2956   haloperidol (HALDOL) tablet 5 mg  5 mg Oral TID PRN Ardis Hughs, NP       Or   haloperidol lactate (HALDOL) injection 5 mg  5 mg Intramuscular TID PRN Ardis Hughs, NP       losartan (COZAAR) tablet 50 mg  50 mg Oral Daily Sarina Ill, DO   50 mg at 12/30/22 2130   And   hydrochlorothiazide (HYDRODIURIL) tablet 12.5 mg  12.5 mg Oral Daily Sarina Ill, DO   12.5 mg at 12/30/22 8657   hydrOXYzine (ATARAX) tablet 25 mg  25 mg Oral TID PRN Ardis Hughs, NP       LORazepam (ATIVAN) tablet 2 mg  2 mg Oral TID PRN Ardis Hughs, NP       Or   LORazepam (ATIVAN) injection 2 mg  2 mg Intramuscular TID PRN Ardis Hughs, NP       magnesium hydroxide (MILK OF MAGNESIA) suspension 30 mL  30 mL Oral Daily PRN Ardis Hughs, NP       metFORMIN (GLUCOPHAGE-XR) 24 hr tablet 500 mg  500 mg Oral BID WC Vernard Gambles H, NP   500 mg at 12/30/22 0834   metoprolol tartrate (LOPRESSOR) tablet 50 mg  50 mg Oral BID Ardis Hughs, NP   50 mg at 12/30/22 0834   nortriptyline (PAMELOR) capsule 10 mg  10 mg Oral QHS Ardis Hughs, NP   10 mg at 12/30/22 0026   pantoprazole (PROTONIX) EC tablet 80 mg  80 mg Oral Daily Ardis Hughs, NP   80 mg at 12/30/22 8469   venlafaxine XR (EFFEXOR-XR) 24 hr capsule 225 mg  225 mg Oral Q breakfast Ardis Hughs, NP   225  mg at 12/30/22 6295   PTA Medications: Medications Prior to Admission  Medication Sig Dispense Refill Last Dose   albuterol (VENTOLIN HFA) 108 (90 Base) MCG/ACT inhaler Inhale 2 puffs into the lungs every 6 (six) hours as  needed for wheezing or shortness of breath. 8 g 6    ALPRAZolam (XANAX) 1 MG tablet Take 1 tablet (1 mg total) by mouth at bedtime as needed for sleep. 90 tablet 1    atorvastatin (LIPITOR) 80 MG tablet Take 1 tablet (80 mg total) by mouth daily. (Patient taking differently: Take 80 mg by mouth at bedtime.) 90 tablet 1    estradiol (ESTRACE VAGINAL) 0.1 MG/GM vaginal cream Place 1 Applicatorful vaginally at bedtime for 14 days, THEN 1 Applicatorful 2 (two) times a week. 42.5 g 12    glimepiride (AMARYL) 1 MG tablet Take 1 tablet (1 mg total) by mouth daily with breakfast. 90 tablet 3    losartan-hydrochlorothiazide (HYZAAR) 50-12.5 MG tablet Take 1 tablet by mouth daily. (Patient taking differently: Take 1 tablet by mouth at bedtime.) 90 tablet 1    metFORMIN (GLUCOPHAGE-XR) 500 MG 24 hr tablet Take 1 tablet (500 mg total) by mouth in the morning and at bedtime. 180 tablet 3    metoprolol tartrate (LOPRESSOR) 50 MG tablet Take 1 tablet (50 mg total) by mouth 2 (two) times daily. 180 tablet 1    nortriptyline (PAMELOR) 10 MG capsule Take 1 capsule (10 mg total) by mouth at bedtime. 30 capsule 2    omeprazole (PRILOSEC) 40 MG capsule Take 1 capsule (40 mg total) by mouth 2 (two) times daily. 180 capsule 2    Semaglutide (RYBELSUS) 14 MG TABS Take 1 tablet (14 mg total) by mouth daily. 90 tablet 3    venlafaxine XR (EFFEXOR-XR) 75 MG 24 hr capsule Take 3 capsules (225 mg total) by mouth daily with breakfast.       Musculoskeletal: Strength & Muscle Tone:  pt sitting on assessment Gait & Station:  pt sitting on assessment Patient leans:  pt sitting on assessment  Psychiatric Specialty Exam:  Presentation  General Appearance:  Appropriate for Environment  Eye  Contact: Fair  Speech: Clear and Coherent; Other (comment) (overall normal rate, has periods where she does speak rapidly/pressured)  Speech Volume: Normal  Handedness: Right   Mood and Affect  Mood: Anxious; Depressed  Affect: Full Range   Thought Process  Thought Processes: Coherent; Goal Directed; Linear  Duration of Psychotic Symptoms:N/A Past Diagnosis of Schizophrenia or Psychoactive disorder: No  Descriptions of Associations:Intact  Orientation:Full (Time, Place and Person)  Thought Content:Logical  Hallucinations:Hallucinations: None  Ideas of Reference:None  Suicidal Thoughts:Suicidal Thoughts: No  Homicidal Thoughts:Homicidal Thoughts: No   Sensorium  Memory: Immediate Good; Recent Good; Remote Good  Judgment: Intact  Insight: Present   Executive Functions  Concentration: Fair  Attention Span: Fair  Recall: [employer] of Knowledge: Fair  Language: Fair   Psychomotor Activity  Psychomotor Activity: Psychomotor Activity: Normal   Assets  Assets: Communication Skills; Desire for Improvement; Financial Resources/Insurance; Housing; Leisure Time; Resilience   Sleep  Sleep: Sleep: Poor Number of Hours of Sleep: 3    Physical Exam: Physical Exam Constitutional:      General: She is not in acute distress.    Appearance: She is not ill-appearing, toxic-appearing or diaphoretic.  Cardiovascular:     Rate and Rhythm: Normal rate.  Pulmonary:     Effort: Pulmonary effort is normal. No respiratory distress.  Neurological:     Mental Status: She is alert and oriented to person, place, and time.  Psychiatric:        Attention and Perception: Attention and perception normal.        Mood and Affect: Affect  normal. Mood is anxious and depressed.        Speech: Speech normal.        Behavior: Behavior normal. Behavior is cooperative.        Thought Content: Thought content normal.        Cognition and Memory: Cognition  and memory normal.        Judgment: Judgment is impulsive.    Review of Systems  Constitutional:  Negative for chills and fever.  Respiratory:  Negative for shortness of breath.   Cardiovascular:  Negative for chest pain and palpitations.  Gastrointestinal:  Negative for abdominal pain.  Neurological:  Negative for headaches.  Psychiatric/Behavioral:  Positive for depression. The patient is nervous/anxious.    Blood pressure 113/78, pulse 77, temperature 97.9 F (36.6 C), temperature source Oral, resp. rate 18, height 5\' 4"  (1.626 m), weight 90.7 kg, last menstrual period 03/22/1986, SpO2 98 %. Body mass index is 34.32 kg/m.  Treatment Plan Summary: Daily contact with patient to assess and evaluate symptoms and progress in treatment, Medication management, and Plan    Continue home medications Continue PRNs No medication changes at this time.   Observation Level/Precautions:  15 minute checks  Laboratory:   No new labs at this time.   Psychotherapy:    Medications:    Consultations:    Discharge Concerns:    Estimated LOS:  Other:     Physician Treatment Plan for Primary Diagnosis: MDD (major depressive disorder), recurrent episode, severe (HCC) Long Term Goal(s): Improvement in symptoms so as ready for discharge  Short Term Goals: Ability to verbalize feelings will improve, Ability to demonstrate self-control will improve, and Ability to identify and develop effective coping behaviors will improve  Physician Treatment Plan for Secondary Diagnosis: Principal Problem:   MDD (major depressive disorder), recurrent episode, severe (HCC) Active Problems:   MDD (major depressive disorder), severe (HCC)  Long Term Goal(s): Improvement in symptoms so as ready for discharge  Short Term Goals: Ability to verbalize feelings will improve, Ability to demonstrate self-control will improve, and Ability to identify and develop effective coping behaviors will improve  I certify that  inpatient services furnished can reasonably be expected to improve the patient's condition.    Lauree Chandler, NP 7/2/20244:06 PM

## 2022-12-30 NOTE — Progress Notes (Addendum)
12/30/22 1918 : Current order ACTIVE for CPAP to start tonight. Note: Sarina Ill, DO Physician Psychiatry   Progress Notes Signed   Date of Service: 12/30/2022  4:23 PM   Signed      Patient ID: Morgan [name], female   DOB: 22-Jan-1966, 57 y.o.   MRN: [id number]   Patient may have her CPAP machine. She does not require a 1:1. RH             After request for consult, therapist requested 1:1 sitter during patient CPAP use, due to safety concerns. Therapist advised sitter would not be provided, as per nursing assessment, it is not warranted by RN, Molli Knock.. The currently approved [facility] - CPAP/BiPAP Machine Usage P&P was referenced by multiple team members, including RN, Hengsterman R. Setup pending physician's order at this time.

## 2022-12-30 NOTE — Plan of Care (Signed)
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
## 2022-12-31 NOTE — Progress Notes (Signed)
D: Patient alert and oriented, able to make needs known. Denies SI/HI, AVH at present. Denies pain at present. Patient goal today "how to stop the anxiety of feling overhwlemed over my life of not having a job, and feeling frustrated lookin for work.." Rates depression 8/10, hopelessness 8/10, and anxiety 5/10. Patient reports energy level as normal. She reports she slept fair last night. Patient does not request any PRN medication at this time.   A: Scheduled medications administered to patient per MD order. Support and encouragement provided. Routine safety checks conducted every fifteen minutes. Patient informed to notify staff with problems or concerns. Frequent verbal contact made.   R: No adverse drug reactions noted. Patient contracts for safety at this time. Patient is compliant with medications and treatment plan. Patient receptive, calm and cooperative. Patient interacts with others appropriately on unit at present. Patient remains safe at present.

## 2022-12-31 NOTE — Progress Notes (Signed)
   12/31/22 2206  BiPAP/CPAP/SIPAP  $ Non-Invasive Ventilator  Non-Invasive Vent Subsequent  BiPAP/CPAP/SIPAP Pt Type Adult  Mask Type Full face mask  Mask Size Medium  Respiratory Rate 18 breaths/min  EPAP  (Auto 6-20)  FiO2 (%) 21 %  Patient Home Equipment No  Auto Titrate Yes  CPAP/SIPAP surface wiped down Yes   Patient placed on CPAP at this time. Tolerating well. Patient declines further assistance.

## 2022-12-31 NOTE — Plan of Care (Signed)

## 2022-12-31 NOTE — Group Note (Signed)
Date:  12/31/2022 Time:  10:57 AM  Group Topic/Focus:  Goals Group:   The focus of this group is to help patients establish daily goals to achieve during treatment and discuss how the patient can incorporate goal setting into their daily lives to aide in recovery.    Participation Level:  Did Not Attend   Lynelle Smoke [facility] 12/31/2022, 10:57 AM

## 2022-12-31 NOTE — Discharge Summary (Addendum)
Physician Discharge Summary Note  Patient:  Morgan [name] is an 57 y.o., female MRN:  [id number] DOB:  1965-10-08 Patient phone:  [phone] (home)  Patient address:   [address]  [address] [place] [address],  Total Time spent with patient: 30 minutes  Date of Admission:  12/29/2022 Date of Discharge: 01/01/2023  Subjective: Morgan [name] was seen on rounds today. Reports mood today is "fine". She feels ready to discharge back home. She states she plans on following up with pcp for medication management and with [facility] for counseling. She states the initial appointment made with [facility] was for next week when she plans on being with her sister so it needed to be rescheduled. She denies SI/HI, AVH, paranoia. She states she is [place] and "couldn't really hurt myself". When asked about her hospitalization, reports she felt it went ok, although was not completely helpful. I did remind her that she did not regularly attend group sessions and declined any medication changes. She states she thought she would be receiving 1:1 counseling. I discussed the nature of groups in an inpatient hospitalization. She verbalizes understanding and vocalized she prefers individual counseling in outpatient setting. She is forward thinking and asks for a note for unemployment. Discussed will let social work team know.  Collateral was obtained with her friend yesterday, who agreed with plan for discharge today. Collateral was also obtained with her sister yesterday, who also agreed with plan for discharge today and confirmed pt will be staying with her for the next week.  Reason for Admission:   Per admission HPI: Pt is a 58 y/o female w/ hx of MDD, GAD, hyperlipidemia, DM type 2, OSA (on CPAP), HTN admitted to [facility] inpatient psychiatry as a transfer from [facility] after presenting as a walk in unescorted with complaints of increased depression and suicidal ideations.   Pt reports history of  anxiety, depression. Reports worsening anxiety, depression after she had to move her mother into a nursing facility on her own last Christmas. Reports in the last few months, she has been experiencing multiple stressors. Her brother had offered to "take care of me" and was assisting with housing when she felt her brother was "changing" asking her to take out her garbage and clean up sticks in the yard. He later told her she had to move out. She states she also lost her job after 7 years with the same company. 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 She was terminated from the company after making a comment at work that if she had a firearm she would shoot her supervisor in front of HR. She reports she presented to [facility] voluntarily because she was having thoughts that she should just get a gun and end it or drive into a lake. Denies she had intent at the time, although felt she should seek help. Per chart review, could not contract for safety yesterday. Reports she does not own a gun but her friends have guns.    Pt reports depressed, anxious mood, feelings of fatigue, worthlessness, hopelessness, recurrent SI, and loss of energy/fatigue. She states she has been "beating myself up" due to thoughts she should not have made comment at work. She reports sleep has been poor, sleeping ~4 hours/night since she lost her job. She reports good appetite, eating 3 meals/day. She states she is currently on unemployment which will end in August and is also  receiving food stamps.    Pt reports use of alcohol, once/week, "5 sips" of hard liquor/occasion. Denies use of marijuana, crack/cocaine, methamphetamines, opioids. UDS from 12/29/22 +oxazepam.    She denies current SI/HI, AVH, paranoia.    Pt denies history of NSSIB, SA, or inpatient psychiatric history.   She receives medication management from primary care Morgan Condon PA-C. Reports Morgan [name] recently  increased her effexor-xr to 225mg . Has only taken 1 dose of increased dose. She states prior to seeing Morgan [name] she had been followed by Morgan [name], who managed her medications. She declined any medication changes today, stating she wants to stay at the increased effexor-xr dose of 225mg  and to have medication changes from Morgan [name] or Morgan [name] who "knows my body".    She was agreeable for collateral to be called. Provided number for her "best friend", Morgan [name], [phone].   Principal Problem: MDD (major depressive disorder), recurrent episode, severe (HCC) Discharge Diagnoses: Principal Problem:   MDD (major depressive disorder), recurrent episode, severe (HCC) Active Problems:   MDD (major depressive disorder), severe (HCC)  Past Psychiatric History: MDD, GAD  Past Medical History:  Past Medical History:  Diagnosis Date   Anal fissure    saw Morgan [name]    Anxiety    on meds   Arthritis    on meds   Asthma    uses inhaler if needed   Carpal tunnel syndrome    Depression    on meds   Diabetes mellitus    on meds   GERD (gastroesophageal reflux disease)    on meds   History of ovarian cancer in adulthood    Hyperlipidemia    on meds   Hypertension    on meds   Ovarian cancer (HCC) 1986   1996   Skin cancer (melanoma) (HCC) 1997   Sleep apnea    uses CPAP    Tubular adenoma of colon 01/2014    Past Surgical History:  Procedure Laterality Date   ABDOMINAL HYSTERECTOMY  06/30/1984   BSO   BASAL CELL CARCINOMA EXCISION  10/09/2012   lower lip per Morgan [name]    COLONOSCOPY  03/23/2019   per Morgan [name], adenomatous polyps, repeat in 3 yrs   GLBS tumor ovary 1986     HEMORRHOID SURGERY     HIP FRACTURE SURGERY Right 2007   MELANOMA EXCISION  07/01/1995   upper chest    Family History:  Family History  Problem Relation Age of Onset   Dementia Mother    Asthma Mother    Multiple sclerosis Father    Uterine cancer Sister 11   Diabetes Brother     Diabetes Brother    Basal cell carcinoma Brother        lower lip   Diabetes Maternal Grandfather    Colon polyps Paternal Grandmother    Stomach cancer Paternal Grandmother        dx 18s-80s   Colon polyps Paternal Grandfather    Arthritis Other    Prostate cancer Other        grandfather per pt but she is unaware of which grandfather   Coronary artery disease Other    Pancreatic cancer Neg Hx    Esophageal cancer Neg Hx    Rectal cancer Neg Hx    Family Psychiatric  History: Reports mother had "mental problems, depression" Social History:  Social History   Substance and Sexual Activity  Alcohol Use No   Alcohol/week: 0.0 standard drinks of alcohol  Social History   Substance and Sexual Activity  Drug Use No    Social History   Socioeconomic History   Marital status: Single    Spouse name: Not on file   Number of children: Not on file   Years of education: Not on file   Highest education level: Not on file  Occupational History   Occupation: billing     Employer: [employer]  Tobacco Use   Smoking status: Never   Smokeless tobacco: Never  Vaping Use   Vaping Use: Never used  Substance and Sexual Activity   Alcohol use: No    Alcohol/week: 0.0 standard drinks of alcohol   Drug use: No   Sexual activity: Yes    Birth control/protection: Surgical  Other Topics Concern   Not on file  Social History Narrative   Originally from [place], [place], & moved to [place] at 57 y.o. She reports she started a new job in July 2016. 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 No pets currently. No bird, mold, or hot tub exposure.       Right Handed    Lives in a one story home    Social Determinants of Health   Financial Resource Strain: Not on file  Food Insecurity: No Food Insecurity (12/30/2022)   Hunger Vital  Sign    Worried About Running Out of Food in the Last Year: Never true    Ran Out of Food in the Last Year: Never true  Recent Concern: Food Insecurity - Food Insecurity Present (12/20/2022)   Hunger Vital Sign    Worried About Running Out of Food in the Last Year: Often true    Ran Out of Food in the Last Year: Often true  Transportation Needs: No Transportation Needs (12/30/2022)   PRAPARE - Administrator, Civil Service (Medical): No    Lack of Transportation (Non-Medical): No  Physical Activity: Not on file  Stress: Not on file  Social Connections: Not on file    Hospital Course:   Zerita Starns was admitted for MDD (major depressive disorder), recurrent episode, severe (HCC) and crisis management. Home medications were restarted as appropriate. She declined any medication changes during her admission. Collateral was obtained from her friend and sister. Collateral with her friend and sister were obtained during admission who agreed with plan for discharge today. Mearl will be staying with her sister for about a week starting on Sunday.   Upon completion of this admission the Deonka Donohoe was both mentally and medically stable for discharge denying suicidal/homicidal ideation, auditory/visual/tactile hallucinations, delusional thoughts and paranoia. Efrat is to follow up with her pcp for continued medication management and with [facility] for counseling.   Physical Findings: AIMS:  , ,  ,  ,    CIWA:    COWS:     Musculoskeletal: Strength & Muscle Tone: within normal limits Gait & Station: normal Patient leans: N/A   Psychiatric Specialty Exam:  Presentation  General Appearance:  Appropriate for Environment  Eye Contact: Fair  Speech: Clear and Coherent; Normal Rate  Speech Volume: Normal  Handedness: Right   Mood and Affect  Mood: -- ("fine")  Affect: Full Range   Thought Process  Thought Processes: Coherent; Goal  Directed; Linear  Descriptions of Associations:Intact  Orientation:Full (Time,  Place and Person)  Thought Content:Logical  History of Schizophrenia/Schizoaffective disorder:No  Duration of Psychotic Symptoms:No data recorded Hallucinations:Hallucinations: None  Ideas of Reference:None  Suicidal Thoughts:Suicidal Thoughts: No  Homicidal Thoughts:Homicidal Thoughts: No   Sensorium  Memory: Immediate Good; Recent Good; Remote Good  Judgment: Intact  Insight: Present   Executive Functions  Concentration: Fair  Attention Span: Fair  Recall: [employer] of Knowledge: Fair  Language: Fair   Psychomotor Activity  Psychomotor Activity: Psychomotor Activity: Normal  Assets  Assets: Communication Skills; Desire for Improvement; Financial Resources/Insurance; Housing; Leisure Time; Social Support  Sleep  Sleep: Sleep: Fair Number of Hours of Sleep: 6  Physical Exam: Physical Exam Constitutional:      General: She is not in acute distress.    Appearance: She is not ill-appearing, toxic-appearing or diaphoretic.  Eyes:     General: No scleral icterus. Cardiovascular:     Rate and Rhythm: Normal rate.  Pulmonary:     Effort: Pulmonary effort is normal. No respiratory distress.  Neurological:     Mental Status: She is alert and oriented to person, place, and time.  Psychiatric:        Attention and Perception: Attention and perception normal.        Mood and Affect: Mood and affect normal.        Speech: Speech normal.        Behavior: Behavior normal. Behavior is cooperative.        Thought Content: Thought content normal.        Cognition and Memory: Cognition and memory normal.        Judgment: Judgment normal.    Review of Systems  Constitutional:  Negative for chills and fever.  Respiratory:  Negative for shortness of breath.   Cardiovascular:  Negative for chest pain and palpitations.  Gastrointestinal:  Negative for abdominal pain.   Neurological:  Negative for headaches.    Blood pressure 114/64, pulse 67, temperature 97.8 F (36.6 C), temperature source Oral, resp. rate 16, height 5\' 4"  (1.626 m), weight 90.7 kg, last menstrual period 03/22/1986, SpO2 96 %. Body mass index is 34.32 kg/m.   Social History   Tobacco Use  Smoking Status Never  Smokeless Tobacco Never   Tobacco Cessation:  N/A, patient does not currently use tobacco products   Blood Alcohol level:  Lab Results  Component Value Date   ETH <10 12/29/2022    Metabolic Disorder Labs:  Lab Results  Component Value Date   HGBA1C 5.9 (H) 12/29/2022   MPG 122.63 12/29/2022   MPG 137 08/16/2008   No results found for: "PROLACTIN" Lab Results  Component Value Date   CHOL 164 12/29/2022   TRIG 273 (H) 12/29/2022   HDL 44 12/29/2022   CHOLHDL 3.7 12/29/2022   VLDL 55 (H) 12/29/2022   LDLCALC 65 12/29/2022   LDLCALC 72 02/22/2020    See Psychiatric Specialty Exam and Suicide Risk Assessment completed by Attending Physician prior to discharge.  Discharge destination:  Home  Is patient on multiple antipsychotic therapies at discharge:  No   Has Patient had three or more failed trials of antipsychotic monotherapy by history:  No  Recommended Plan for Multiple Antipsychotic Therapies: NA   Allergies as of 01/01/2023       Reactions   Lisinopril Cough        Medication List     STOP taking these medications    nortriptyline 10 MG capsule Commonly known as: PAMELOR  TAKE these medications      Indication  albuterol 108 (90 Base) MCG/ACT inhaler Commonly known as: VENTOLIN HFA Inhale 2 puffs into the lungs every 6 (six) hours as needed for wheezing or shortness of breath.  Indication: Asthma   ALPRAZolam 1 MG tablet Commonly known as: XANAX Take 1 tablet (1 mg total) by mouth at bedtime as needed for sleep.  Indication: Feeling Anxious   atorvastatin 80 MG tablet Commonly known as: LIPITOR Take 1 tablet (80 mg  total) by mouth daily. What changed: when to take this  Indication: High Amount of Fats in the Blood   estradiol 0.1 MG/GM vaginal cream Commonly known as: ESTRACE VAGINAL Place 1 Applicatorful vaginally at bedtime for 14 days, THEN 1 Applicatorful 2 (two) times a week. Start taking on: October 23, 2022  Indication: Vulvovaginal Atrophy   glimepiride 1 MG tablet Commonly known as: AMARYL Take 1 tablet (1 mg total) by mouth daily with breakfast.  Indication: Type 2 Diabetes   losartan-hydrochlorothiazide 50-12.5 MG tablet Commonly known as: HYZAAR Take 1 tablet by mouth daily. What changed: when to take this  Indication: High Blood Pressure Disorder   metFORMIN 500 MG 24 hr tablet Commonly known as: GLUCOPHAGE-XR Take 1 tablet (500 mg total) by mouth in the morning and at bedtime.  Indication: Type 2 Diabetes   metoprolol tartrate 50 MG tablet Commonly known as: LOPRESSOR Take 1 tablet (50 mg total) by mouth 2 (two) times daily.  Indication: High Blood Pressure Disorder   omeprazole 40 MG capsule Commonly known as: PRILOSEC Take 1 capsule (40 mg total) by mouth 2 (two) times daily.  Indication: Gastroesophageal Reflux Disease   Rybelsus 14 MG Tabs Generic drug: Semaglutide Take 1 tablet (14 mg total) by mouth daily.  Indication: Type 2 Diabetes   venlafaxine XR 75 MG 24 hr capsule Commonly known as: EFFEXOR-XR Take 3 capsules (225 mg total) by mouth daily with breakfast.  Indication: Generalized Anxiety Disorder, Major Depressive Disorder        Follow-up Information     [facility] Follow up.   Why: Appointment is scheduled 01/21/2023 at 2:00PM.  Appointment can be changed to virtual BUT this appointment is scheduled as face to face. Contact information: [address] [place] [address] [phone]                Follow-up recommendations:   Follow up with [facility] for outpatient behavioral health  services.  Signed: Lauree Chandler, NP 01/01/2023, 9:45 AM

## 2022-12-31 NOTE — Progress Notes (Signed)
[facility] MD Progress Note  12/31/2022 11:42 AM Morgan [name]  MRN:  [id number] Subjective:   Morgan [name] is seen on rounds. Reports depressed, anxious mood, improved compared to yesterday. Denies SI/HI, AVH, paranoia. Continues to want medications to remain the same and be followed by outpatient provider. She signed 72 hour document today. We discussed plan to discharge tomorrow and she is in agreement.   Pt states she does not want her friend Vicente Serene to be contacted, feels Margaretha Glassing is not very understanding.   Pt states her disability advocate Juventino Slovak can be contacted and provides phone number [phone]. Artist Pais was called with pt present. Artist Pais states she feels pt "had a rough day" and was overwhelmed due to stressors. Discussed plan to discharge tomorrow and she is in agreement. States she can be the one to pick pt up if needed. She states she can continue to support pt and denies concerns with pt being discharged tomorrow. We discussed pt's statements about obtaining a firearm from a friend. Artist Pais states she knows which friend pt is talking about and he will not be giving pt firearm. She states she can check in on pt and prevent pt from obtaining firearm.   Pt states she plans on going to stay with her sister for a week. She was agreeable to [place] calling her sister, Nolberto Hanlon, together, and provided Marisa's contact information, [phone]. Nolberto Hanlon agrees with plan for discharge tomorrow and for pt to stay with her for a week starting on Sunday.   Principal Problem: MDD (major depressive disorder), recurrent episode, severe (HCC) Diagnosis: Principal Problem:   MDD (major depressive disorder), recurrent episode, severe (HCC) Active Problems:   MDD (major depressive disorder), severe (HCC)  Total Time spent with patient: 30 minutes  Past Psychiatric History: MDD, GAD  Past Medical History:  Past Medical History:  Diagnosis Date   Anal fissure    saw Dr. Loretha Brasil     Anxiety    on meds   Arthritis    on meds   Asthma    uses inhaler if needed   Carpal tunnel syndrome    Depression    on meds   Diabetes mellitus    on meds   GERD (gastroesophageal reflux disease)    on meds   History of ovarian cancer in adulthood    Hyperlipidemia    on meds   Hypertension    on meds   Ovarian cancer (HCC) 1986   1996   Skin cancer (melanoma) (HCC) 1997   Sleep apnea    uses CPAP    Tubular adenoma of colon 01/2014    Past Surgical History:  Procedure Laterality Date   ABDOMINAL HYSTERECTOMY  06/30/1984   BSO   BASAL CELL CARCINOMA EXCISION  10/09/2012   lower lip per Dr. Park Liter    COLONOSCOPY  03/23/2019   per Dr. Adela Lank, adenomatous polyps, repeat in 3 yrs   GLBS tumor ovary 1986     HEMORRHOID SURGERY     HIP FRACTURE SURGERY Right 2007   MELANOMA EXCISION  07/01/1995   upper chest    Family History:  Family History  Problem Relation Age of Onset   Dementia Mother    Asthma Mother    Multiple sclerosis Father    Uterine cancer Sister 80   Diabetes Brother    Diabetes Brother    Basal cell carcinoma Brother        lower lip   Diabetes Maternal Grandfather    Colon  polyps Paternal Grandmother    Stomach cancer Paternal Grandmother        dx 80s-80s   Colon polyps Paternal Grandfather    Arthritis Other    Prostate cancer Other        grandfather per pt but she is unaware of which grandfather   Coronary artery disease Other    Pancreatic cancer Neg Hx    Esophageal cancer Neg Hx    Rectal cancer Neg Hx    Family Psychiatric  History: Reports mother had "mental problems, depression" Social History:  Social History   Substance and Sexual Activity  Alcohol Use No   Alcohol/week: 0.0 standard drinks of alcohol     Social History   Substance and Sexual Activity  Drug Use No    Social History   Socioeconomic History   Marital status: Single    Spouse name: Not on file   Number of children: Not on file   Years of  education: Not on file   Highest education level: Not on file  Occupational History   Occupation: billing     Employer: [employer]  Tobacco Use   Smoking status: Never   Smokeless tobacco: Never  Vaping Use   Vaping Use: Never used  Substance and Sexual Activity   Alcohol use: No    Alcohol/week: 0.0 standard drinks of alcohol   Drug use: No   Sexual activity: Yes    Birth control/protection: Surgical  Other Topics Concern   Not on file  Social History Narrative   Originally from [place], [place], & moved to [place] at 57 y.o. She reports she started a new job in July 2016. 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 No pets currently. No bird, mold, or hot tub exposure.       Right Handed    Lives in a one story home    Social Determinants of Health   Financial Resource Strain: Not on file  Food Insecurity: No Food Insecurity (12/30/2022)   Hunger Vital Sign    Worried About Running Out of Food in the Last Year: Never true    Ran Out of Food in the Last Year: Never true  Recent Concern: Food Insecurity - Food Insecurity Present (12/20/2022)   Hunger Vital Sign    Worried About Running Out of Food in the Last Year: Often true    Ran Out of Food in the Last Year: Often true  Transportation Needs: No Transportation Needs (12/30/2022)   PRAPARE - Administrator, Civil Service (Medical): No    Lack of Transportation (Non-Medical): No  Physical Activity: Not on file  Stress: Not on file  Social Connections: Not on file   Additional Social History:                         Sleep: Fair  Appetite:  Good  Current Medications: Current Facility-Administered Medications  Medication Dose Route Frequency Provider Last Rate Last Admin   acetaminophen (TYLENOL) tablet 650 mg  650 mg Oral Q6H PRN Ardis Hughs, NP   650 mg at 12/30/22 1600   ALPRAZolam (XANAX) tablet 1 mg  1 mg Oral QHS PRN Ardis Hughs, NP   1 mg at 12/30/22 2156   alum & mag hydroxide-simeth (  MAALOX/MYLANTA) 200-200-20 MG/5ML suspension 30 mL  30 mL Oral Q4H PRN Ardis Hughs, NP       atorvastatin (LIPITOR) tablet 80 mg  80 mg Oral Daily Ardis Hughs, NP   80 mg at 12/31/22 0809   diphenhydrAMINE (BENADRYL) capsule 50 mg  50 mg Oral TID PRN Ardis Hughs, NP       Or   diphenhydrAMINE (BENADRYL) injection 50 mg  50 mg Intramuscular TID PRN Ardis Hughs, NP       glimepiride (AMARYL) tablet 1 mg  1 mg Oral Q breakfast Ardis Hughs, NP   1 mg at 12/31/22 9629   haloperidol (HALDOL) tablet 5 mg  5 mg Oral TID PRN Ardis Hughs, NP       Or   haloperidol lactate (HALDOL) injection 5 mg  5 mg Intramuscular TID PRN Ardis Hughs, NP       losartan (COZAAR) tablet 50 mg  50 mg Oral Daily Sarina Ill, DO   50 mg at 12/31/22 5284   And   hydrochlorothiazide (HYDRODIURIL) tablet 12.5 mg  12.5 mg Oral Daily Sarina Ill, DO   12.5 mg at 12/31/22 1324   hydrOXYzine (ATARAX) tablet 25 mg  25 mg Oral TID PRN Ardis Hughs, NP       LORazepam (ATIVAN) tablet 2 mg  2 mg Oral TID PRN Ardis Hughs, NP       Or   LORazepam (ATIVAN) injection 2 mg  2 mg Intramuscular TID PRN Ardis Hughs, NP       magnesium hydroxide (MILK OF MAGNESIA) suspension 30 mL  30 mL Oral Daily PRN Ardis Hughs, NP       metFORMIN (GLUCOPHAGE-XR) 24 hr tablet 500 mg  500 mg Oral BID WC Ardis Hughs, NP   500 mg at 12/31/22 0811   metoprolol tartrate (LOPRESSOR) tablet 50 mg  50 mg Oral BID Ardis Hughs, NP   50 mg at 12/31/22 0810   nortriptyline (PAMELOR) capsule 10 mg  10 mg Oral QHS Ardis Hughs, NP   10 mg at 12/30/22 2155   pantoprazole (PROTONIX) EC tablet 80 mg  80 mg Oral Daily Ardis Hughs, NP   80 mg at 12/31/22 0810   venlafaxine XR (EFFEXOR-XR) 24  hr capsule 225 mg  225 mg Oral Q breakfast Ardis Hughs, NP   225 mg at 12/31/22 4010    Lab Results:  Results for orders placed or performed during the hospital encounter of 12/29/22 (from the past 48 hour(s))  CBC with Differential/Platelet     Status: None   Collection Time: 12/29/22  7:35 PM  Result Value Ref Range   WBC 10.5 4.0 - 10.5 K/uL   RBC 4.53 3.87 - 5.11 MIL/uL   Hemoglobin 13.3 12.0 - 15.0 g/dL   HCT 27.2 53.6 - 64.4 %   MCV 89.4 80.0 - 100.0 fL   MCH 29.4 26.0 - 34.0 pg   MCHC 32.8 30.0 - 36.0 g/dL   RDW 03.4 74.2 - 59.5 %   Platelets 293 150 - 400 K/uL   nRBC 0.0 0.0 - 0.2 %   Neutrophils Relative % 72 %   Neutro Abs 7.6 1.7 - 7.7 K/uL   Lymphocytes Relative 18 %   Lymphs Abs 1.9 0.7 - 4.0 K/uL   Monocytes Relative 8 %   Monocytes Absolute 0.8 0.1 - 1.0 K/uL   Eosinophils Relative 2 %  Eosinophils Absolute 0.2 0.0 - 0.5 K/uL   Basophils Relative 0 %   Basophils Absolute 0.0 0.0 - 0.1 K/uL   WBC Morphology MORPHOLOGY UNREMARKABLE    RBC Morphology MORPHOLOGY UNREMARKABLE    Smear Review PLATELET COUNT CONFIRMED BY SMEAR    Abs Immature Granulocytes 0.00 0.00 - 0.07 K/uL    Comment: Performed at [facility], [address] [place], [place] [address]  Comprehensive metabolic panel     Status: Abnormal   Collection Time: 12/29/22  7:35 PM  Result Value Ref Range   Sodium 138 135 - 145 mmol/L   Potassium 3.5 3.5 - 5.1 mmol/L   Chloride 98 98 - 111 mmol/L   CO2 27 22 - 32 mmol/L   Glucose, Bld 99 70 - 99 mg/dL    Comment: Glucose reference range applies only to samples taken after fasting for at least 8 hours.   BUN 21 (H) 6 - 20 mg/dL   Creatinine, Ser 6.04 0.44 - 1.00 mg/dL   Calcium 9.6 8.9 - 54.0 mg/dL   Total Protein 7.2 6.5 - 8.1 g/dL   Albumin 4.0 3.5 - 5.0 g/dL   AST 16 15 - 41 U/L   ALT 19 0 - 44 U/L   Alkaline Phosphatase 70 38 - 126 U/L   Total Bilirubin 0.7 0.3 - 1.2 mg/dL   GFR, Estimated >98 >11 mL/min    Comment:  (NOTE) Calculated using the CKD-EPI Creatinine Equation (2021)    Anion gap 13 5 - 15    Comment: Performed at [facility], [address], [place], [place] [address]  Hemoglobin A1c     Status: Abnormal   Collection Time: 12/29/22  7:35 PM  Result Value Ref Range   Hgb A1c MFr Bld 5.9 (H) 4.8 - 5.6 %    Comment: (NOTE) Pre diabetes:          5.7%-6.4%  Diabetes:              >6.4%  Glycemic control for   <7.0% adults with diabetes    Mean Plasma Glucose 122.63 mg/dL    Comment: Performed at [facility], [address], [place], [place] [address]  Magnesium     Status: Abnormal   Collection Time: 12/29/22  7:35 PM  Result Value Ref Range   Magnesium 1.5 (L) 1.7 - 2.4 mg/dL    Comment: Performed at [facility], [address], [place], [place] [address]  Ethanol     Status: None   Collection Time: 12/29/22  7:35 PM  Result Value Ref Range   Alcohol, Ethyl (B) <10 <10 mg/dL    Comment: (NOTE) Lowest detectable limit for serum alcohol is 10 mg/dL.  For medical purposes only. Performed at [facility], [address] [place], [place] [address]   Lipid panel     Status: Abnormal   Collection Time: 12/29/22  7:35 PM  Result Value Ref Range   Cholesterol 164 0 - 200 mg/dL   Triglycerides 962 (H) <150 mg/dL   HDL 44 >95 mg/dL   Total CHOL/HDL Ratio 3.7 RATIO   VLDL 55 (H) 0 - 40 mg/dL   LDL Cholesterol 65 0 - 99 mg/dL    Comment:        Total Cholesterol/HDL:CHD Risk Coronary Heart Disease Risk Table                     Men  Women  1/2 Average Risk   3.4   3.3  Average Risk       5.0   4.4  2 X Average Risk   9.6   7.1  3 X Average Risk  23.4   11.0        Use the calculated Patient Ratio above and the CHD Risk Table to determine the patient's CHD Risk.        ATP III CLASSIFICATION (LDL):  <100     mg/dL   Optimal  161-096  mg/dL   Near or Above                    Optimal  130-159  mg/dL   Borderline  045-409  mg/dL   High  >811      mg/dL   Very High Performed at [facility], [address] [place], [place] [address]   TSH     Status: None   Collection Time: 12/29/22  7:35 PM  Result Value Ref Range   TSH 1.163 0.350 - 4.500 uIU/mL    Comment: Performed by a 3rd Generation assay with a functional sensitivity of <=0.01 uIU/mL. Performed at [facility], [address], [place], [place] [address]   POCT Urine Drug Screen - (I-Screen)     Status: Abnormal   Collection Time: 12/29/22  7:35 PM  Result Value Ref Range   POC Amphetamine UR None Detected NONE DETECTED (Cut Off Level 1000 ng/mL)   POC Secobarbital (BAR) None Detected NONE DETECTED (Cut Off Level 300 ng/mL)   POC Buprenorphine (BUP) None Detected NONE DETECTED (Cut Off Level 10 ng/mL)   POC Oxazepam (BZO) Positive (A) NONE DETECTED (Cut Off Level 300 ng/mL)   POC Cocaine UR None Detected NONE DETECTED (Cut Off Level 300 ng/mL)   POC Methamphetamine UR None Detected NONE DETECTED (Cut Off Level 1000 ng/mL)   POC Morphine None Detected NONE DETECTED (Cut Off Level 300 ng/mL)   POC Methadone UR None Detected NONE DETECTED (Cut Off Level 300 ng/mL)   POC Oxycodone UR None Detected NONE DETECTED (Cut Off Level 100 ng/mL)   POC Marijuana UR None Detected NONE DETECTED (Cut Off Level 50 ng/mL)  Urinalysis, Complete w Microscopic -Urine, Clean Catch     Status: Abnormal   Collection Time: 12/29/22  7:35 PM  Result Value Ref Range   Color, Urine YELLOW YELLOW   APPearance CLEAR CLEAR   Specific Gravity, Urine 1.025 1.005 - 1.030   pH 5.0 5.0 - 8.0   Glucose, UA NEGATIVE NEGATIVE mg/dL   Hgb urine dipstick NEGATIVE NEGATIVE   Bilirubin Urine NEGATIVE NEGATIVE   Ketones, ur NEGATIVE NEGATIVE mg/dL   Protein, ur NEGATIVE NEGATIVE mg/dL   Nitrite NEGATIVE NEGATIVE   Leukocytes,Ua LARGE (A) NEGATIVE   RBC / HPF 0-5 0 - 5 RBC/hpf   WBC, UA 21-50 0 - 5 WBC/hpf   Bacteria, UA NONE SEEN NONE SEEN   Squamous Epithelial / HPF 0-5 0 - 5 /HPF   Mucus  PRESENT     Comment: Performed at [facility], [address], [place], [place] [address]  Glucose, capillary     Status: Abnormal   Collection Time: 12/29/22 10:45 PM  Result Value Ref Range   Glucose-Capillary 133 (H) 70 - 99 mg/dL    Comment: Glucose reference range applies only to samples taken after fasting for at least 8 hours.    Blood Alcohol level:  Lab Results  Component Value Date   ETH <10 12/29/2022    Metabolic Disorder Labs: Lab Results  Component Value Date   HGBA1C 5.9 (H) 12/29/2022   MPG 122.63 12/29/2022   MPG 137 08/16/2008   No results found for: "PROLACTIN" Lab Results  Component Value Date   CHOL 164 12/29/2022   TRIG 273 (H) 12/29/2022   HDL 44 12/29/2022   CHOLHDL 3.7 12/29/2022   VLDL 55 (H) 12/29/2022   LDLCALC 65 12/29/2022   LDLCALC 72 02/22/2020    Physical Findings: AIMS:  , ,  ,  ,    CIWA:    COWS:     Musculoskeletal: Strength & Muscle Tone: within normal limits Gait & Station: normal Patient leans: N/A  Psychiatric Specialty Exam:  Presentation  General Appearance:  Appropriate for Environment  Eye Contact: Fair  Speech: Clear and Coherent; Normal Rate  Speech Volume: Normal  Handedness: Right   Mood and Affect  Mood: -- (continues to feel anxious, depressed, improved from yesterday)  Affect: Full Range   Thought Process  Thought Processes: Coherent; Goal Directed; Linear  Descriptions of Associations:Intact  Orientation:Full (Time, Place and Person)  Thought Content:Logical  History of Schizophrenia/Schizoaffective disorder:No  Duration of Psychotic Symptoms:No data recorded Hallucinations:Hallucinations: None  Ideas of Reference:None  Suicidal Thoughts:Suicidal Thoughts: No  Homicidal Thoughts:Homicidal Thoughts: No   Sensorium  Memory: Immediate Good; Recent Good; Remote Good  Judgment: Intact  Insight: Present   Executive Functions  Concentration: Fair  Attention  Span: Fair  Recall: [employer] of Knowledge: Fair  Language: Fair   Psychomotor Activity  Psychomotor Activity: Psychomotor Activity: Normal   Assets  Assets: Communication Skills; Desire for Improvement; Financial Resources/Insurance; Housing; Leisure Time; Social Support   Sleep  Sleep: Sleep: Fair Number of Hours of Sleep: 3    Physical Exam: Physical Exam Constitutional:      General: She is not in acute distress.    Appearance: She is not ill-appearing, toxic-appearing or diaphoretic.  Eyes:     General: No scleral icterus. Cardiovascular:     Rate and Rhythm: Normal rate.  Pulmonary:     Effort: Pulmonary effort is normal. No respiratory distress.  Neurological:     Mental Status: She is alert and oriented to person, place, and time.  Psychiatric:        Attention and Perception: Attention and perception normal.        Mood and Affect: Affect normal. Mood is anxious and depressed.        Speech: Speech normal.        Behavior: Behavior normal. Behavior is cooperative.        Thought Content: Thought content normal.        Cognition and Memory: Cognition and memory normal.        Judgment: Judgment normal.    Review of Systems  Constitutional:  Negative for chills and fever.  Respiratory:  Negative for shortness of breath.   Cardiovascular:  Negative for chest pain and palpitations.  Gastrointestinal:  Negative for abdominal pain.  Neurological:  Negative for headaches.  Psychiatric/Behavioral:  Positive for depression. The patient is nervous/anxious.    Blood pressure 95/67, pulse 87, temperature 98.4 F (36.9 C), temperature source Oral, resp. rate 19, height 5\' 4"  (1.626 m), weight 90.7 kg, last menstrual period 03/22/1986, SpO2 99 %. Body mass index is 34.32 kg/m.  Treatment Plan Summary: Daily contact with patient to assess and evaluate symptoms and progress in treatment, Medication  management, and Plan continue current medications, plan for  discharge tomorrow  Lauree Chandler, NP 12/31/2022, 11:42 AM

## 2022-12-31 NOTE — Progress Notes (Signed)
Patient was observed on the phone and tearful. She later spoke in depth with this writer about her admission.  She denied SI, HI, hallucinations, and delusions.  She admitted that while driving she heard a voice that she ignored.  She compared this episode to "a devil on my shoulder".  She adamantly denied being homicidal or suicidal.  She became tearful and couldn't understand why she was "being tested.Marland Kitchenibuprofen know I have a purpose here".  She discussed coping skills that she will utilize in the future.  No s/sx of acute distress observed or reported.  Medications received without difficultly.   CPAP placed by Respiratory Therapist.  Patient rested in bed with CPAP in place - respirations even & unlabored.  Safety checks Q15 min in place as per protocol.  Plan of Care followed.

## 2022-12-31 NOTE — Group Note (Signed)
Recreation Therapy Group Note   Group Topic:Relaxation  Group Date: 12/31/2022 Start Time: 1000 End Time: 1050 Facilitators: Rosina Lowenstein, LRT, CTRS Location:  Craft room   Group Description: PMR (Progressive Muscle Relaxation). LRT asks patients their current level of stress/anxiety from 1-10, with 10 being the highest. LRT educates patients on what PMR is and the benefits that come from it. Patients are asked to sit with their feet flat on the floor while sitting up and all the way back in their chair, if possible. LRT and pts follow a prompt through a speaker that requires you to tense and release different muscles in their body and focus on their breathing. During session, lights are off and soft music is being played. At the end of the prompt, LRT asks patients to rank their current levels of stress/anxiety from 1-10, 10 being the highest.   Goal Area(s) Addressed:  Patients will be able to describe progressive muscle relaxation.  Patient will practice using relaxation technique. Patient will identify a new coping skill.  Patient will follow multistep directions to reduce anxiety and stress.   Affect/Mood: Appropriate   Participation Level: Moderate   Participation Quality: Independent   Behavior: Calm   Speech/Thought Process: Coherent   Insight: Fair   Judgement: Fair    Modes of Intervention: Activity   Patient Response to Interventions:  Receptive   Education Outcome:  In group clarification offered    Clinical Observations/Individualized Feedback: Morgan [name] was mostly active in their participation of session activities and group discussion. Pt came to group with about 10 minutes remaining. Pt did not fill out before and after sheet, as she came in in the middle of the exercise.    Plan: Continue to engage patient in RT group sessions 2-3x/week.   Rosina Lowenstein, LRT, CTRS 12/31/2022 11:04 AM

## 2022-12-31 NOTE — Group Note (Signed)
Date:  12/31/2022 Time:  5:20 PM  Group Topic/Focus:  Activity/Outdoor Recreation    Participation Level:  Did Not Attend   Lynelle Smoke [facility] 12/31/2022, 5:20 PM

## 2022-12-31 NOTE — BH IP Treatment Plan (Signed)
Interdisciplinary Treatment and Diagnostic Plan Update  12/31/2022 Time of Session: 8: 52 AM  Morgan [name] MRN: [id number]  Principal Diagnosis: MDD (major depressive disorder), recurrent episode, severe (HCC)  Secondary Diagnoses: Principal Problem:   MDD (major depressive disorder), recurrent episode, severe (HCC) Active Problems:   MDD (major depressive disorder), severe (HCC)   Current Medications:  Current Facility-Administered Medications  Medication Dose Route Frequency Provider Last Rate Last Admin   acetaminophen (TYLENOL) tablet 650 mg  650 mg Oral Q6H PRN Ardis Hughs, NP   650 mg at 12/30/22 1600   ALPRAZolam (XANAX) tablet 1 mg  1 mg Oral QHS PRN Ardis Hughs, NP   1 mg at 12/30/22 2156   alum & mag hydroxide-simeth (MAALOX/MYLANTA) 200-200-20 MG/5ML suspension 30 mL  30 mL Oral Q4H PRN Ardis Hughs, NP       atorvastatin (LIPITOR) tablet 80 mg  80 mg Oral Daily Ardis Hughs, NP   80 mg at 12/31/22 0809   diphenhydrAMINE (BENADRYL) capsule 50 mg  50 mg Oral TID PRN Ardis Hughs, NP       Or   diphenhydrAMINE (BENADRYL) injection 50 mg  50 mg Intramuscular TID PRN Ardis Hughs, NP       glimepiride (AMARYL) tablet 1 mg  1 mg Oral Q breakfast Ardis Hughs, NP   1 mg at 12/31/22 4098   haloperidol (HALDOL) tablet 5 mg  5 mg Oral TID PRN Ardis Hughs, NP       Or   haloperidol lactate (HALDOL) injection 5 mg  5 mg Intramuscular TID PRN Ardis Hughs, NP       losartan (COZAAR) tablet 50 mg  50 mg Oral Daily Sarina Ill, DO   50 mg at 12/31/22 1191   And   hydrochlorothiazide (HYDRODIURIL) tablet 12.5 mg  12.5 mg Oral Daily Sarina Ill, DO   12.5 mg at 12/31/22 0810   hydrOXYzine (ATARAX) tablet 25 mg  25 mg Oral TID PRN Ardis Hughs, NP       LORazepam (ATIVAN) tablet 2 mg  2 mg Oral TID PRN Ardis Hughs, NP       Or   LORazepam (ATIVAN) injection 2 mg  2 mg Intramuscular TID  PRN Ardis Hughs, NP       magnesium hydroxide (MILK OF MAGNESIA) suspension 30 mL  30 mL Oral Daily PRN Ardis Hughs, NP       metFORMIN (GLUCOPHAGE-XR) 24 hr tablet 500 mg  500 mg Oral BID WC Ardis Hughs, NP   500 mg at 12/31/22 0811   metoprolol tartrate (LOPRESSOR) tablet 50 mg  50 mg Oral BID Ardis Hughs, NP   50 mg at 12/31/22 0810   nortriptyline (PAMELOR) capsule 10 mg  10 mg Oral QHS Ardis Hughs, NP   10 mg at 12/30/22 2155   pantoprazole (PROTONIX) EC tablet 80 mg  80 mg Oral Daily Ardis Hughs, NP   80 mg at 12/31/22 0810   venlafaxine XR (EFFEXOR-XR) 24 hr capsule 225 mg  225 mg Oral Q breakfast Ardis Hughs, NP   225 mg at 12/31/22 0810   PTA Medications: Medications Prior to Admission  Medication Sig Dispense Refill Last Dose   albuterol (VENTOLIN HFA) 108 (90 Base) MCG/ACT inhaler Inhale 2 puffs into the lungs every 6 (six) hours as needed for wheezing or shortness of breath. 8 g 6    ALPRAZolam (  XANAX) 1 MG tablet Take 1 tablet (1 mg total) by mouth at bedtime as needed for sleep. 90 tablet 1    atorvastatin (LIPITOR) 80 MG tablet Take 1 tablet (80 mg total) by mouth daily. (Patient taking differently: Take 80 mg by mouth at bedtime.) 90 tablet 1    estradiol (ESTRACE VAGINAL) 0.1 MG/GM vaginal cream Place 1 Applicatorful vaginally at bedtime for 14 days, THEN 1 Applicatorful 2 (two) times a week. 42.5 g 12    glimepiride (AMARYL) 1 MG tablet Take 1 tablet (1 mg total) by mouth daily with breakfast. 90 tablet 3    losartan-hydrochlorothiazide (HYZAAR) 50-12.5 MG tablet Take 1 tablet by mouth daily. (Patient taking differently: Take 1 tablet by mouth at bedtime.) 90 tablet 1    metFORMIN (GLUCOPHAGE-XR) 500 MG 24 hr tablet Take 1 tablet (500 mg total) by mouth in the morning and at bedtime. 180 tablet 3    metoprolol tartrate (LOPRESSOR) 50 MG tablet Take 1 tablet (50 mg total) by mouth 2 (two) times daily. 180 tablet 1    nortriptyline  (PAMELOR) 10 MG capsule Take 1 capsule (10 mg total) by mouth at bedtime. 30 capsule 2    omeprazole (PRILOSEC) 40 MG capsule Take 1 capsule (40 mg total) by mouth 2 (two) times daily. 180 capsule 2    Semaglutide (RYBELSUS) 14 MG TABS Take 1 tablet (14 mg total) by mouth daily. 90 tablet 3    venlafaxine XR (EFFEXOR-XR) 75 MG 24 hr capsule Take 3 capsules (225 mg total) by mouth daily with breakfast.       Patient Stressors: Financial difficulties   Marital or family conflict    Patient Strengths: Ability for insight  Motivation for treatment/growth   Treatment Modalities: Medication Management, Group therapy, Case management,  1 to 1 session with clinician, Psychoeducation, Recreational therapy.   Physician Treatment Plan for Primary Diagnosis: MDD (major depressive disorder), recurrent episode, severe (HCC) Long Term Goal(s): Improvement in symptoms so as ready for discharge   Short Term Goals: Ability to verbalize feelings will improve Ability to demonstrate self-control will improve Ability to identify and develop effective coping behaviors will improve  Medication Management: Evaluate patient's response, side effects, and tolerance of medication regimen.  Therapeutic Interventions: 1 to 1 sessions, Unit Group sessions and Medication administration.  Evaluation of Outcomes: Not Met  Physician Treatment Plan for Secondary Diagnosis: Principal Problem:   MDD (major depressive disorder), recurrent episode, severe (HCC) Active Problems:   MDD (major depressive disorder), severe (HCC)  Long Term Goal(s): Improvement in symptoms so as ready for discharge   Short Term Goals: Ability to verbalize feelings will improve Ability to demonstrate self-control will improve Ability to identify and develop effective coping behaviors will improve     Medication Management: Evaluate patient's response, side effects, and tolerance of medication regimen.  Therapeutic Interventions: 1 to 1  sessions, Unit Group sessions and Medication administration.  Evaluation of Outcomes: Not Met   RN Treatment Plan for Primary Diagnosis: MDD (major depressive disorder), recurrent episode, severe (HCC) Long Term Goal(s): Knowledge of disease and therapeutic regimen to maintain health will improve  Short Term Goals: Ability to remain free from injury will improve, Ability to verbalize frustration and anger appropriately will improve, Ability to demonstrate self-control, Ability to participate in decision making will improve, Ability to verbalize feelings will improve, Ability to disclose and discuss suicidal ideas, Ability to identify and develop effective coping behaviors will improve, and Compliance with prescribed medications will improve  Medication Management: RN will administer medications as ordered by provider, will assess and evaluate patient's response and provide education to patient for prescribed medication. RN will report any adverse and/or side effects to prescribing provider.  Therapeutic Interventions: 1 on 1 counseling sessions, Psychoeducation, Medication administration, Evaluate responses to treatment, Monitor vital signs and CBGs as ordered, Perform/monitor CIWA, COWS, AIMS and Fall Risk screenings as ordered, Perform wound care treatments as ordered.  Evaluation of Outcomes: Not Met   LCSW Treatment Plan for Primary Diagnosis: MDD (major depressive disorder), recurrent episode, severe (HCC) Long Term Goal(s): Safe transition to appropriate next level of care at discharge, Engage patient in therapeutic group addressing interpersonal concerns.  Short Term Goals: Engage patient in aftercare planning with referrals and resources, Increase social support, Increase ability to appropriately verbalize feelings, Increase emotional regulation, Facilitate acceptance of mental health diagnosis and concerns, Facilitate patient progression through stages of change regarding substance use  diagnoses and concerns, Identify triggers associated with mental health/substance abuse issues, and Increase skills for wellness and recovery  Therapeutic Interventions: Assess for all discharge needs, 1 to 1 time with Social worker, Explore available resources and support systems, Assess for adequacy in community support network, Educate family and significant other(s) on suicide prevention, Complete Psychosocial Assessment, Interpersonal group therapy.  Evaluation of Outcomes: Not Met   Progress in Treatment: Attending groups: Yes. and No. Participating in groups: Yes. and No. Taking medication as prescribed: Yes. Toleration medication: Yes. Family/Significant other contact made: No, will contact:  CSW will contact if given permission  Patient understands diagnosis: Yes. Discussing patient identified problems/goals with staff: Yes. Medical problems stabilized or resolved: Yes. Denies suicidal/homicidal ideation: Yes. Issues/concerns per patient self-inventory: No. Other: None   New problem(s) identified: No, Describe:  None identified   New Short Term/Long Term Goal(s): elimination of symptoms of psychosis, medication management for mood stabilization; elimination of SI thoughts; development of comprehensive mental wellness/sobriety plan.   Patient Goals:  "To stop having these anxiety attacks and anger management"   Discharge Plan or Barriers: CSW will assist with appropriate discharge planning   Reason for Continuation of Hospitalization: Anxiety Medication stabilization  Estimated Length of Stay: 1 to 7 days   Last 3 [place] Suicide Severity Risk Score: Flowsheet Row Admission (Current) from 12/29/2022 in [facility] INPATIENT BEHAVIORAL MEDICINE Most recent reading at 12/30/2022 12:08 AM ED from 12/29/2022 in [facility] Most recent reading at 12/29/2022 10:24 PM ED to Hosp-Admission (Discharged) from 09/22/2022 in [facility] Most recent reading at 09/22/2022  9:45 AM  C-SSRS RISK CATEGORY Low Risk No Risk No Risk       Last PHQ 2/9 Scores:    10/06/2022    4:10 PM 07/07/2022    8:58 AM 03/25/2022    4:50 PM  Depression screen PHQ 2/9  Decreased Interest 1 0 2  Down, Depressed, Hopeless 1 0 1  PHQ - 2 Score 2 0 3  Altered sleeping 0  1  Tired, decreased energy 1  3  Change in appetite 0  3  Feeling bad or failure about yourself  0  2  Trouble concentrating 0  0  Moving slowly or fidgety/restless 0  0  Suicidal thoughts 0  0  PHQ-9 Score 3  12  Difficult doing work/chores   Somewhat difficult    Scribe for Treatment Team: Elza Rafter, [place] 12/31/2022 11:21 AM

## 2022-12-31 NOTE — Progress Notes (Addendum)
Suicide Risk Assessment  Discharge Assessment    [facility] Discharge Suicide Risk Assessment   Principal Problem: MDD (major depressive disorder), recurrent episode, severe (HCC) Discharge Diagnoses: Principal Problem:   MDD (major depressive disorder), recurrent episode, severe (HCC) Active Problems:   MDD (major depressive disorder), severe (HCC)  Total Time spent with patient: 30 minutes  Musculoskeletal: Strength & Muscle Tone: within normal limits Gait & Station: normal Patient leans: N/A  Psychiatric Specialty Exam  Presentation  General Appearance:  Appropriate for Environment  Eye Contact: Fair  Speech: Clear and Coherent; Normal Rate  Speech Volume: Normal  Handedness: Right   Mood and Affect  Mood: "Fine"  Duration of Depression Symptoms: Greater than two weeks  Affect: Full Range   Thought Process  Thought Processes: Coherent; Goal Directed; Linear  Descriptions of Associations:Intact  Orientation:Full (Time, Place and Person)  Thought Content:Logical  History of Schizophrenia/Schizoaffective disorder:No  Duration of Psychotic Symptoms:No data recorded Hallucinations:Hallucinations: None  Ideas of Reference:None  Suicidal Thoughts:Suicidal Thoughts: No  Homicidal Thoughts:Homicidal Thoughts: No   Sensorium  Memory: Immediate Good; Recent Good; Remote Good  Judgment: Intact  Insight: Present   Executive Functions  Concentration: Fair  Attention Span: Fair  Recall: [employer] of Knowledge: Fair  Language: Fair   Psychomotor Activity  Psychomotor Activity: Psychomotor Activity: Normal   Assets  Assets: Communication Skills; Desire for Improvement; Financial Resources/Insurance; Housing; Leisure Time; Social Support   Sleep  Sleep: Sleep: Fair Number of Hours of Sleep: 6   Physical Exam: Physical Exam see discharge note ROS see discharge note Blood pressure 95/67, pulse 87, temperature 98.4 F (36.9  C), temperature source Oral, resp. rate 19, height 5\' 4"  (1.626 m), weight 90.7 kg, last menstrual period 03/22/1986, SpO2 99 %. Body mass index is 34.32 kg/m.  Mental Status Per Nursing Assessment::   On Admission:  Suicidal ideation indicated by patient  Demographic Factors:  Caucasian, Low socioeconomic status, Living alone, and Unemployed  Loss Factors: Decrease in vocational status and Financial problems/change in socioeconomic status  Historical Factors: Family history of mental illness or substance abuse and Impulsivity  Risk Reduction Factors:   Sense of responsibility to family and Positive social support  Continued Clinical Symptoms:  Previous Psychiatric Diagnoses and Treatments  Cognitive Features That Contribute To Risk:  None    Suicide Risk:  Minimal: No identifiable suicidal ideation.  Patients presenting with no risk factors but with morbid ruminations; may be classified as minimal risk based on the severity of the depressive symptoms  Plan Of Care/Follow-up recommendations:  Pt to follow up with outpatient counseling. She has expressed desire to remain with her primary care for psychiatric medication management.  Lauree Chandler, NP 12/31/2022, 3:58 PM

## 2022-12-31 NOTE — BHH Counselor (Addendum)
CSW contacted the patient's PCP, [phone], [facility].  Staff initially provided information for a referral to [address] [place], [place].  Upon review this CSW discovered that this was a dermatology, OB/GYN and Oncology office and this referral was supposed to be for mental health.  He reports that patient was originally referred to the [facility] office but they are booked out 6 months. He reports that he is unable to identify any other facility, he reports that the closest is in [place].  He reports that he is unable to send referral out electronically.  Penni Homans, MSW, LCSW 12/31/2022 3:42 PM     CSW contact pt's primary care provider's office at [facility] [phone]) regarding assistance with scheduling mental health follow up. CSW was informed that they were trying to refer to [facility] at [facility]) or their campus at [address] [place], [place]. No other concerns expressed. Contact ended without incident.   Vilma Meckel. Algis Greenhouse, MSW, LCSW, LCAS 12/31/2022 1:18 PM

## 2023-01-01 ENCOUNTER — Encounter: Payer: Self-pay | Admitting: Family Medicine

## 2023-01-01 NOTE — Progress Notes (Signed)
  [facility] Adult Case Management Discharge Plan :  Will you be returning to the same living situation after discharge:  Yes,  pt reports that she is returning home.  At discharge, do you have transportation home?: Yes,  CSW to assist with transportation needs. Do you have the ability to pay for your medications: Yes,  [employer] [facility]  Release of information consent forms completed and in the chart;  Patient's signature needed at discharge.  Patient to Follow up at:  Follow-up Information     [facility] Follow up.   Why: Appointment is scheduled 01/21/2023 at 2:00PM.  Appointment can be changed to virtual BUT this appointment is scheduled as face to face. Contact information: [address] [place] [address] [phone]                 Next level of care provider has access to [facility] Link:no  Safety Planning and Suicide Prevention discussed: Yes,  SPE completed with the patient.      Has patient been referred to the Quitline?: Patient does not use tobacco/nicotine products  Patient has been referred for addiction treatment: No known substance use disorder.  Harden Mo, LCSW 01/01/2023, 10:09 AM

## 2023-01-01 NOTE — Group Note (Signed)
Recreation Therapy Group Note   Group Topic:Communication  Group Date: 01/01/2023 Start Time: 1000 End Time: 1050 Facilitators: Clinton Gallant, [place] Location:  Craft Room  Group Description: Fourth of July Games. LRT and patients played Patriotic Bingo, "Can you name three?", and 4th of July Trivia. Pt won prizes for [employer] of bingo. LRT prompted communication between peers. Patriotic music was playing in the background throughout group.   Goal Area(s) Addressed: Patient will increase communication skills by talking with LRT and peers.  Patient will increase frustration tolerance skills.   Affect/Mood: Appropriate   Participation Level: Active and Engaged   Participation Quality: Independent   Behavior: Appropriate, Calm, and Cooperative   Speech/Thought Process: Coherent   Insight: Good   Judgement: Good   Modes of Intervention: Cooperative Play and Open Conversation   Patient Response to Interventions:  Attentive, Engaged, Interested , and Receptive   Education Outcome:  Acknowledges education   Clinical Observations/Individualized Feedback: Morgan [name] was active in their participation of session activities and group discussion. Pt identified "I usually go to my friends house and see my grandchildren" as a holiday tradition. Pt appropriately played games while interacting well with LRT and peers duration of session.   Plan: Continue to engage patient in RT group sessions 2-3x/week.   Morgan [name], LRT, CTRS 01/01/2023 11:24 AM

## 2023-01-01 NOTE — Progress Notes (Signed)
Patient discharged with all belongings, AVS, verbalized understanding of follow up appointment with [facility]. Denies current SI/HI/AVH. Verbalizes no concerns at discharge.

## 2023-01-01 NOTE — Plan of Care (Signed)

## 2023-01-01 NOTE — Plan of Care (Signed)
  Problem: Health Behavior/Discharge Planning: Goal: Ability to manage health-related needs will improve Outcome: Progressing   Problem: Safety: Goal: Ability to remain free from injury will improve Outcome: Progressing   Problem: Coping: Goal: Level of anxiety will decrease Outcome: Progressing  Patient compliant with treatment plan visible in Milieu this shift denies SI/HI/A/VH and verbally contracts for safety. Support and encouragement provided. Patient Cpap working with no issues reported.

## 2023-01-01 NOTE — Progress Notes (Signed)
D: Patient alert and oriented, able to make needs known. Denies SI/HI, AVH at present. Denies pain at present. Patient goal today "get rid of my anger and feeling so overwhelmed, its going to continue when I get home because I didn't have any counseling here." Support provided and reminded patient she has been given opportunities to attend group therapies during her stay here but she has chosen not to go to those at times. Rates depression 10/10, hopelessness 10/10, and anxiety 10/10. Patient reports energy level as low. She reports she slept poor last night. Patient does not request any PRN medication at this time. Patient is scheduled to discharge home today.  A: Scheduled medications administered to patient per MD order. Support and encouragement provided. Routine safety checks conducted every fifteen minutes. Patient informed to notify staff with problems or concerns. Frequent verbal contact made.   R: No adverse drug reactions noted. Patient contracts for safety at this time. Patient is compliant with medications and treatment plan. Patient receptive, calm and cooperative. Patient interacts with others appropriately on unit at present. Patient remains safe at present.

## 2023-01-01 NOTE — Group Note (Signed)
Date:  01/01/2023 Time:  10:09 AM  Group Topic/Focus:  Activity Group:  The focus of this group is to promote exercise for physical and mental wellbeing and the chance for the patients to get some fresh air.     Participation Level:  Active  Participation Quality:  Appropriate  Affect:  Appropriate  Cognitive:  Appropriate  Insight: Appropriate  Engagement in Group:  Engaged  Modes of Intervention:  Activity  Additional Comments:    Morgan [name] 01/01/2023, 10:09 AM

## 2023-01-02 ENCOUNTER — Ambulatory Visit: Payer: Self-pay | Admitting: Genetic Counselor

## 2023-01-02 ENCOUNTER — Telehealth: Payer: Self-pay | Admitting: Genetic Counselor

## 2023-01-02 ENCOUNTER — Encounter: Payer: Self-pay | Admitting: Genetic Counselor

## 2023-01-02 DIAGNOSIS — Z1379 Encounter for other screening for genetic and chromosomal anomalies: Secondary | ICD-10-CM | POA: Insufficient documentation

## 2023-01-02 DIAGNOSIS — Z85820 Personal history of malignant melanoma of skin: Secondary | ICD-10-CM

## 2023-01-02 DIAGNOSIS — Z86010 Personal history of colonic polyps: Secondary | ICD-10-CM

## 2023-01-02 DIAGNOSIS — Z8543 Personal history of malignant neoplasm of ovary: Secondary | ICD-10-CM

## 2023-01-02 NOTE — Telephone Encounter (Signed)
FYI

## 2023-01-02 NOTE — Progress Notes (Signed)
HPI:   Ms. Enslin was previously seen in the [facility] due to a personal and family history of cancer and concerns regarding a hereditary predisposition to cancer.    Ms. Rightmyer recent genetic test results were disclosed to her by telephone. These results and recommendations are discussed in more detail below.  CANCER HISTORY:  In 1987, Ms. 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 She reports a history of ~15 'golf-ball sized' tumors of her abdomen.      FAMILY HISTORY:  We obtained a detailed, 4-generation family history.  Significant diagnoses are listed below:      Family History  Problem Relation Age of Onset   Ovarian cancer Sister 50   Basal cell carcinoma Brother          lower lip   Stomach cancer Paternal Grandmother          dx 38s-80s       Ms. Wiley is unaware of previous family history of genetic testing for hereditary cancer risks. There is no reported Ashkenazi Jewish ancestry. There is no known consanguinity.    GENETIC TEST RESULTS:  The [facility] CancerNext-Expanded +RNAinsight Panel found no pathogenic mutations.   The CancerNext-Expanded gene panel offered by [facility] and includes sequencing, rearrangement, and RNA analysis for the following 71 genes:  AIP, ALK, APC, ATM, BAP1, BARD1, BMPR1A, BRCA1, BRCA2, BRIP1, CDC73, CDH1, CDK4, CDKN1B, CDKN2A, CHEK2, DICER1, FH, FLCN, KIF1B, LZTR1,MAX, MEN1, MET, MLH1, MSH2, MSH6, MUTYH, NF1, NF2, NTHL1, PALB2, PHOX2B, PMS2, POT1, PRKAR1A, PTCH1, PTEN, RAD51C,RAD51D, RB1, RET, SDHA, SDHAF2, SDHB, SDHC, SDHD, SMAD4, SMARCA4, SMARCB1, SMARCE1, STK11, SUFU, TMEM127, TP53,TSC1, TSC2 and VHL (sequencing and deletion/duplication); AXIN2, CTNNA1, EGFR, EGLN1, HOXB13, KIT, MITF, MSH3, PDGFRA, POLD1 and POLE (sequencing only); EPCAM and GREM1  (deletion/duplication only).   The test report has been scanned into EPIC and is located under the Molecular Pathology section of the Results Review tab.  A portion of the result report is included below for reference. Genetic testing reported out on December 24, 2022.      Even though a pathogenic variant was not identified, possible explanations for the cancer in the family may include: There may be no hereditary risk for cancer in the family. The cancers in Ms. Mongold and/or her family may be sporadic/familial or due to other genetic and environmental factors. There may be a gene mutation in one of these genes that current testing methods cannot detect but that chance is small. There could be another gene that has not yet been discovered, or that we have not yet tested, that is responsible for the cancer diagnoses in the family.  It is also possible there is a hereditary cause for the cancer in the family that Ms. Wenrick did not inherit.   Therefore, it is important to remain in touch with cancer genetics in the future so that we can continue to offer Ms. Muska the most up to date genetic testing.     ADDITIONAL GENETIC TESTING:   Ms. Wyche genetic testing was fairly extensive.  If there are additional relevant genes identified to increase cancer risk that can be analyzed in the future, we would be happy to discuss and coordinate this testing at that time.      CANCER SCREENING RECOMMENDATIONS:  Ms. Huddleston test result  is considered negative (normal).  This means that we have not identified a hereditary cause for her personal history of cancer or colon polyps at this time.   An individual's cancer risk and medical management are not determined by genetic test results alone. Overall cancer risk assessment incorporates additional factors, including personal medical history, family history, and any available genetic information that may result in a personalized plan for cancer  prevention and surveillance. Therefore, it is recommended she continue to follow the cancer management and screening guidelines provided by her primary healthcare provider.  RECOMMENDATIONS FOR FAMILY MEMBERS:   Individuals in this family might be at some increased risk of developing cancer, over the general population risk, due to the family history of cancer.  Individuals in the family should notify their providers of the family history of cancer.    FOLLOW-UP:  Cancer genetics is a rapidly advancing field and it is possible that new genetic tests will be appropriate for her and/or her family members in the future. We encourage Ms. Mitschke to remain in contact with cancer genetics, so we can update her personal and family histories and let her know of advances in cancer genetics that may benefit this family.   Our contact number was provided.  She knows she is welcome to call us at anytime with additional questions or concerns.   Abbigayle Toole M. Rennie Plowman, MS, [facility] Genetic Counselor [email or web address] [phone]

## 2023-01-02 NOTE — Telephone Encounter (Signed)
Disclosed negative genetics.    

## 2023-01-06 ENCOUNTER — Ambulatory Visit: Payer: Self-pay | Admitting: Family Medicine

## 2023-01-08 ENCOUNTER — Encounter: Payer: Self-pay | Admitting: Family Medicine

## 2023-01-09 NOTE — Telephone Encounter (Signed)
I have no idea if we take this insurance or not. Please find out and let her know

## 2023-01-09 NOTE — Telephone Encounter (Signed)
Please advise 

## 2023-01-13 ENCOUNTER — Ambulatory Visit: Payer: PRIVATE HEALTH INSURANCE | Admitting: Family Medicine

## 2023-01-15 ENCOUNTER — Encounter: Payer: Self-pay | Admitting: Family Medicine

## 2023-01-15 ENCOUNTER — Encounter: Payer: Self-pay | Admitting: *Deleted

## 2023-01-15 NOTE — Progress Notes (Signed)
Pt attended 12/20/22 event where her b/p was 120/83 and her blood sugar was 118. At the event, the pt identified food and housing insecurities and was given resources for both. The event form noted that her PCP was Dr. Gershon [name] at [facility] [place]. However, chart review indicates pt lost her job and insurance in May and she is trying to establish care with a PCP office that her insurance covers and to connect for ongoing endocrinology and [facility] services.During pt call, she shared that she is currently insured by an A[employer] plan that does not cover any of her current [facility] providers and she does not want to continue to use the [facility] PCP to whom she is currently assigned. She confirmed her job loss and impending unemployment income loss effective 8/24. Health equity team member suggested pt trying to apply for expanded Medicaid which would potentially help her continue to see her current PCP and health team providers and help decrease med costs, etc. Pt asked for info to be emailed to her, which it was. Morgan [name], DSS expert at [facility] then offered to call pt and try to get her application set up for [facility]. Pt then confirmed via email that Morgan [name] immediately contacted her by phone and completed a Medicaid application and submitted it to [facility] for approval today. In the interim, pt maintains contact with her PCP of record Morgan [name] and made an appt for July 29,2024 with Dr. Clent [name].

## 2023-01-16 ENCOUNTER — Encounter: Payer: Self-pay | Admitting: *Deleted

## 2023-01-16 ENCOUNTER — Encounter: Payer: Self-pay | Admitting: Internal Medicine

## 2023-01-16 NOTE — Telephone Encounter (Signed)
We will see her when we can

## 2023-01-16 NOTE — Progress Notes (Unsigned)
Pt referred to DSS Medicaid on 12/20/22. Response received today from DSS Medicaid : "[facility] DSS has already approved Morgan [name] application.  Her Medicaid is active effective 11/29/22 - 12/28/23 and her Medicaid ID#, if you need it, is [id number].  They have mailed the usual documentation to her but I just also sent her a quick email to let her know."

## 2023-01-19 ENCOUNTER — Telehealth: Payer: Self-pay

## 2023-01-19 NOTE — Telephone Encounter (Signed)
..   Medicaid Managed Care   Unsuccessful Outreach Note  01/19/2023 Name: Morgan [name] MRN: [id number] DOB: 18-Feb-1966  Referred by: Nelwyn Salisbury, MD Reason for referral : Appointment (I attempted to reach out to the patient today to get her scheduled with the MM Team. She did not answer but I was able to leave a message on her voicemail.)   An unsuccessful telephone outreach was attempted today. The patient was referred to the case management team for assistance with care management and care coordination.   Follow Up Plan: A HIPAA compliant phone message was left for the patient providing contact information and requesting a return call.  The care management team will reach out to the patient again over the next 7 days.   Weston Settle Care Guide  [facility] Managed  Care Guide [facility]  [phone]

## 2023-01-19 NOTE — Telephone Encounter (Signed)
We have your appointment for August 9th at 2:40pm. She does accept Medicaid but you will want to reach out to your insurance or check online and make sure she is a provider on your plan.

## 2023-01-20 ENCOUNTER — Telehealth: Payer: Self-pay

## 2023-01-20 NOTE — Telephone Encounter (Signed)
..   Medicaid Managed Care   Unsuccessful Outreach Note  01/20/2023 Name: Morgan [name] MRN: [id number] DOB: May 09, 1966  Referred by: Nelwyn Salisbury, MD Reason for referral : Appointment   A second unsuccessful telephone outreach was attempted today. The patient was referred to the case management team for assistance with care management and care coordination.   Follow Up Plan: A HIPAA compliant phone message was left for the patient providing contact information and requesting a return call.  The care management team will reach out to the patient again over the next 7 days.   Weston Settle Care Guide  [facility] Managed  Care Guide [facility]  [phone]

## 2023-01-26 ENCOUNTER — Ambulatory Visit (INDEPENDENT_AMBULATORY_CARE_PROVIDER_SITE_OTHER): Payer: PRIVATE HEALTH INSURANCE | Admitting: Family Medicine

## 2023-01-26 ENCOUNTER — Encounter: Payer: Self-pay | Admitting: Family Medicine

## 2023-01-26 VITALS — BP 118/70 | HR 68 | Resp 12

## 2023-01-26 DIAGNOSIS — F322 Major depressive disorder, single episode, severe without psychotic features: Secondary | ICD-10-CM

## 2023-01-26 DIAGNOSIS — I10 Essential (primary) hypertension: Secondary | ICD-10-CM | POA: Diagnosis not present

## 2023-01-26 MED ORDER — ALPRAZOLAM 1 MG PO TABS: 1.0000 mg | ORAL_TABLET | Freq: Every evening | ORAL | 1 refills | Status: DC | PRN

## 2023-01-26 NOTE — Progress Notes (Signed)
   Subjective:    Patient ID: Morgan [name], female    DOB: Aug 02, 1965, 57 y.o.   MRN: [id number]  HPI Here to follow up on a 4 days stay at [facility] in [place] for depression and possible suicidal ideation. In May she had a disagreement with her boss, and then she made a comment to a coworker that she was thinking of "killing" the boss. This was reported and she was immediately fired from the job. Since then she has applied for 43 different jobs and has been turned down for all of them. Her unemployment runs out next month, and she is very stressed about her financial situation. She also says she has heard voices whispering to her twice in the past few weeks, the last time being 2 weeks ago. She talked to [facility] and she was admitted voluntarily to the [facility]. She says this was helpful, and she assures me she has no thoughts of harming herself or anyone else. She has applied for disability, and she has a friend who is helping her work through this with a Clinical research associate. Her medical insurance changed and she was forced to see a primary care provider named Carmela Rima in the [facility] system. Charlaine saw her once and she was not comfortable with her, so she has decided to keep seeing [place] instead.    .      Objective:   Physical Exam Constitutional:      Appearance: Normal appearance.  Cardiovascular:     Rate and Rhythm: Normal rate and regular rhythm.     Pulses: Normal pulses.     Heart sounds: Normal heart sounds.  Pulmonary:     Effort: Pulmonary effort is normal.     Breath sounds: Normal breath sounds.  Neurological:     General: No focal deficit present.     Mental Status: She is alert and oriented to person, place, and time.  Psychiatric:        Behavior: Behavior normal.        Thought Content: Thought content normal.     Comments: She is mildly anxious but appropriate            Assessment & Plan:  Her HTN is stable. Her  depression and anxiety have been a problem in the weeks since she lost her job. We agreed to stay on Venlafaxine and Alprazolam for now. I discussed with her the possibility of adding a medication with antipsychotic effects, but she wants to hold on that for now. She agreed to tell me if she ever hears voices again. She is scheduled to see Dr. Lonzo Cloud for her diabetes on 02-06-23. We spent a total of ( 33  ) minutes reviewing records and discussing these issues.  Gershon Crane, MD

## 2023-01-28 ENCOUNTER — Other Ambulatory Visit: Payer: PRIVATE HEALTH INSURANCE | Admitting: Licensed Clinical Social Worker

## 2023-01-28 DIAGNOSIS — F333 Major depressive disorder, recurrent, severe with psychotic symptoms: Secondary | ICD-10-CM

## 2023-01-28 DIAGNOSIS — F322 Major depressive disorder, single episode, severe without psychotic features: Secondary | ICD-10-CM

## 2023-01-28 DIAGNOSIS — F419 Anxiety disorder, unspecified: Secondary | ICD-10-CM

## 2023-01-28 NOTE — Patient Outreach (Addendum)
Medicaid Managed Care Social Work Note  01/28/2023 Name:  Morgan [name] MRN:  [id number] DOB:  07/19/1965  Morgan [name] is an 57 y.o. year old female who is a primary patient of Clent Ridges Tera Mater, MD.  The Medicaid Managed Care Coordination team was consulted for assistance with:  Mental Health Counseling and Resources  Morgan [name] was given information about Medicaid Managed Care Coordination team services today. Morgan [name] Patient agreed to services and verbal consent obtained.  Engaged with patient  for by telephone forinitial visit in response to referral for case management and/or care coordination services.   Assessments/Interventions:  Review of past medical history, allergies, medications, health status, including review of consultants reports, laboratory and other test data, was performed as part of comprehensive evaluation and provision of chronic care management services.  SDOH: (Social Determinant of Health) assessments and interventions performed: SDOH Interventions    Flowsheet Row Patient Outreach Telephone from 01/28/2023 in [place] POPULATION HEALTH DEPARTMENT Community Documentation from 12/20/2022 in [facility]  SDOH Interventions    Food Insecurity Interventions -- Other (Comment)  [Food resources given]  Housing Interventions -- Other (Comment)  [Housing resources given]  Transportation Interventions -- Intervention Not Indicated  Depression Interventions/Treatment  Referral to Psychiatry  [Resources provided] --       Advanced Directives Status:  See Care Plan for related entries.  Care Plan                 Allergies  Allergen Reactions   Lisinopril Cough    Medications Reviewed Today   Medications were not reviewed in this encounter     Patient Active Problem List   Diagnosis Date Noted   Genetic testing 01/02/2023   MDD (major depressive disorder), recurrent episode, severe (HCC) 12/29/2022   MDD  (major depressive disorder), severe (HCC) 12/29/2022   History of ovarian cancer in adulthood 12/15/2022   Campylobacter diarrhea 09/24/2022   Hypokalemia 09/22/2022   Actinic skin damage 09/22/2022   Acute gastroenteritis 09/22/2022   Carpal tunnel syndrome, left upper limb 08/26/2021   Carpal tunnel syndrome, right upper limb 08/26/2021   Carpal tunnel syndrome on both sides 08/02/2021   Asthma 01/09/2021   Vitamin B12 deficiency 09/05/2020   Dyslipidemia 09/04/2020   Neuropathy 09/04/2020   Mixed dyslipidemia 09/04/2020   COVID-19 virus infection 06/19/2020   Type 2 diabetes mellitus with diabetic polyneuropathy, without long-term current use of insulin (HCC) 08/25/2019   Type 2 diabetes mellitus with hyperglycemia, without long-term current use of insulin (HCC) 02/17/2019   Anxiety and depression 02/17/2019   BMI 45.0-49.9, adult (HCC) 02/04/2019   Ringing in ears, bilateral 12/29/2017   OSA on CPAP 01/06/2017   Witnessed episode of apnea 07/03/2016   Diverticulitis of colon 11/30/2014   IBS (irritable bowel syndrome) 04/21/2013   Chronic cough 11/26/2011   External hemorrhoids 04/04/2010   UNSPECIFIED THROMBOSED HEMORRHOIDS 01/08/2009   GERD 08/23/2008   Diabetes mellitus without complication (HCC) 01/22/2007   Hyperlipidemia 01/22/2007   Essential hypertension 01/22/2007    Conditions to be addressed/monitored per PCP order:  Anxiety and Depression  Care Plan : LCSW Plan of Care  Updates made by Morgan Bryant, LCSW since 01/28/2023 12:00 AM     Problem: Depression Identification (Depression)      Goal: Depressive Symptoms Identified   Start Date: 01/28/2023  Note:   Priority: High  Timeframe:  Short-Range Goal Priority:  High Start Date:   01/28/23  Expected End Date:  ongoing                     Follow Up Date-- 02/11/23 at 1 pm  - keep 90 percent of scheduled appointments -consider counseling or psychiatry -consider bumping up your self-care   -consider creating a stronger support network   Why is this important?             Combatting stress may take some time.            If you don't feel better right away, don't give up on your treatment plan.    Current barriers:   Chronic Mental Health needs related to depression and anxiety. Patient requires Support, Education, Resources, Referrals, Advocacy, and Care Coordination, in order to meet Unmet Mental Health Needs to Find a Therapist and Psychiatrist. Patient will implement clinical interventions discussed today to decrease symptoms of depression and increase knowledge and/or ability of: coping skills. Mental Health Concerns and Social Isolation Recent job loss Recent inpatient BH stay Loss of stable housing due to family conflict  Patient lacks knowledge of available community counseling agencies and resources.  Clinical Goal(s): verbalize understanding of plan for management of Anger, Grief, Anxiety and Depression, and demonstrate a reduction in symptoms. Patient will connect with a provider for ongoing mental health treatment, increase coping skills, healthy habits, self-management skills, and stress reduction        Clinical Interventions:  Assessed patient's previous and current treatment, coping skills, support system and barriers to care. Patient provided hx. Recent inpatient BH stay at [facility].  Verbalization of feelings encouraged, motivational interviewing employed Emotional support provided, positive coping strategies explored. Establishing healthy boundaries emphasized and healthy self-care education provided Patient was educated on available mental health resources within their area that accept Medicaid and offer counseling and psychiatry. Patient reports having Medicaid but this has not been confirmed yet.  Patient is agreeable to referral to [facility] for counseling and psychiatry. [facility] LCSW made referral on 01/28/23. Email sent to patient today with available mental health  resources within her area that accept Medicaid and offer the services that she is interested in. Email included instructions for scheduling at [facility] as well as some crisis support resources and [facility]'s walk in clinic hours.  Emotional support provided. CBT intervention implemented regarding "being mentally fit" by combating negative thinking and replacing it with uplifting support, hope and positivity. Patient reports significant worsening anger impacting their ability to function appropriately and carry out daily task (recent job loss). Patient receives strong support from sister and niece. LCSW provided education on relaxation techniques such as meditation, deep breathing, massage, grounding exercises or yoga that can activate the body's relaxation response and ease symptoms of stress and anxiety. LCSW ask that when pt is struggling with difficult emotions and racing thoughts that they start this relaxation response process. LCSW provided extensive education on healthy coping skills for anxiety. SW used active and reflective listening, validated patient's feelings/concerns, and provided emotional support. Motivational Interviewing employed Depression screen reviewed  PHQ2/ PHQ9 completed or reviewed  Mindfulness or Relaxation training provided Active listening / Reflection utilized  Advance Care and HCPOA education provided Emotional Support Provided Problem Solving /Task Center strategies reviewed Provided psychoeducation for mental health needs  Provided brief CBT  Reviewed mental health medications and discussed importance of compliance:  Quality of sleep assessed & Sleep Hygiene techniques promoted  Participation in counseling encouraged  Verbalization of feelings encouraged  Suicidal Ideation/Homicidal Ideation assessed: Patient  denies SI/HI  Review resources, discussed options and provided patient information about  Mental Health Resources Inter-disciplinary care team collaboration (see  longitudinal plan of care) Patient Goals/Self-Care Activities: Over the next 120 days Attend scheduled medical appointments Utilize healthy coping skills and supportive resources discussed Contact PCP with any questions or concerns Keep 90 percent of counseling appointments Call your insurance provider for more information about your Enhanced Benefits  Check out counseling resources provided  Begin personal counseling with LCSW, to reduce and manage symptoms of Depression and Stress, until well-established with mental health provider Accept all calls from representative with [facility] in an effort to establish ongoing mental health counseling and supportive services. Incorporate into daily practice - relaxation techniques, deep breathing exercises, and mindfulness meditation strategies. Talk about feelings with friends, family members, spiritual advisor, etc. Contact LCSW directly [phone]), if you have questions, need assistance, or if additional social work needs are identified between now and our next scheduled telephone outreach call. Call 988 for mental health hotline/crisis line if needed (24/7 available) Try techniques to reduce symptoms of anxiety/negative thinking (deep breathing, distraction, positive self talk, etc)  - develop a personal safety plan - develop a plan to deal with triggers like holidays, anniversaries - exercise at least 2 to 3 times per week - have a plan for how to handle bad days - journal feelings and what helps to feel better or worse - spend time or talk with others at least 2 to 3 times per week - watch for early signs of feeling worse - begin personal counseling - call and visit an old friend - check out volunteer opportunities - join a support group - laugh; watch a funny movie or comedian - learn and use visualization or guided imagery - perform a random act of kindness - practice relaxation or meditation daily - start or continue a personal  journal - practice positive thinking and self-talk -continue with compliance of taking medication  -identify current effective and ineffective coping strategies.  -implement positive self-talk in care to increase self-esteem, confidence and feelings of control.  -consider alternative and complementary therapy approaches such as meditation, mindfulness or yoga.  Call your insurance provider to gain education on benefits if desired Call your primary care doctor if symptoms get worse  -journaling, prayer, worship services, meditation or pastoral counseling.  -increase participation in pleasurable group activities such as hobbies, singing, sports or volunteering).  -consider the use of meditative movement therapy such as tai chi, yoga or qigong.  -start a regular daily exercise program based on tolerance, ability and patient choice to support positive thinking and activity    If you are experiencing a Mental Health or Behavioral Health Crisis or need someone to talk to, please call the Suicide and Crisis Lifeline: 988    Patient Goals: Initial Visit        Follow up:  Patient agrees to Care Plan and Follow-up.  Plan: The Managed Medicaid care management team will reach out to the patient again over the next 30 days.  Dickie La, BSW, MSW, [employer] Managed Medicaid LCSW [facility]  [facility] [email or web address] Phone: [phone]

## 2023-01-28 NOTE — Patient Instructions (Signed)
Visit Information  Thank you for taking time to visit with me today. Please don't hesitate to contact me if I can be of assistance to you.   Our next appointment is by telephone on 02/11/23 at 1 pm  If you are experiencing a Mental Health or Behavioral Health Crisis or need someone to talk to, please call the Suicide and Crisis Lifeline: 988 call the [place] National Suicide Prevention Lifeline: 208-068-4319 or TTY: (209)209-2827 TTY 418-647-5496) to talk to a trained counselor call 1-800-273-TALK (toll free, 24 hour hotline) go to [facility] [address] [place] [phone])   Patient verbalizes understanding of instructions and care plan provided today and agrees to view in MyChart. Active MyChart status and patient understanding of how to access instructions and care plan via MyChart confirmed with patient.     Dickie La, BSW, MSW, [employer] Managed Medicaid LCSW [facility]  [facility] [email or web address] Phone: [phone]

## 2023-02-06 ENCOUNTER — Ambulatory Visit (INDEPENDENT_AMBULATORY_CARE_PROVIDER_SITE_OTHER): Payer: Medicaid Other | Admitting: Internal Medicine

## 2023-02-06 ENCOUNTER — Encounter: Payer: Self-pay | Admitting: Internal Medicine

## 2023-02-06 VITALS — BP 110/68 | HR 74 | Ht 64.0 in | Wt 205.0 lb

## 2023-02-06 DIAGNOSIS — Z7984 Long term (current) use of oral hypoglycemic drugs: Secondary | ICD-10-CM

## 2023-02-06 DIAGNOSIS — E1142 Type 2 diabetes mellitus with diabetic polyneuropathy: Secondary | ICD-10-CM | POA: Diagnosis not present

## 2023-02-06 MED ORDER — RYBELSUS 14 MG PO TABS: 14.0000 mg | ORAL_TABLET | Freq: Every day | ORAL | 3 refills | Status: DC

## 2023-02-06 MED ORDER — GLIMEPIRIDE 1 MG PO TABS: 1.0000 mg | ORAL_TABLET | Freq: Every day | ORAL | 3 refills | Status: DC

## 2023-02-06 MED ORDER — METFORMIN HCL ER 500 MG PO TB24: 500.0000 mg | ORAL_TABLET | Freq: Every day | ORAL | 3 refills | Status: DC

## 2023-02-06 NOTE — Patient Instructions (Signed)
-   Continue  Glimepiride 1 mg , 1 tablet before lunch  - Decrease  Metformin 500 mg 1 tablet daily - Continue Rybelsus 14 mg , 1 tablet every morning    HOW TO TREAT LOW BLOOD SUGARS (Blood sugar LESS THAN 70 MG/DL) Please follow the RULE OF 15 for the treatment of hypoglycemia treatment (when your (blood sugars are less than 70 mg/dL)   STEP 1: Take 15 grams of carbohydrates when your blood sugar is low, which includes:  3-4 GLUCOSE TABS  OR 3-4 OZ OF JUICE OR REGULAR SODA OR ONE TUBE OF GLUCOSE GEL    STEP 2: RECHECK blood sugar in 15 MINUTES STEP 3: If your blood sugar is still low at the 15 minute recheck --> then, go back to STEP 1 and treat AGAIN with another 15 grams of carbohydrates.

## 2023-02-06 NOTE — Progress Notes (Signed)
Name: Morgan [name]  Age/ Sex: 57 y.o., female   MRN/ DOB: [id number], 1965/08/11     PCP: Nelwyn Salisbury, MD   Reason for Endocrinology Evaluation: Type 2 Diabetes Mellitus  Initial Endocrine Consultative Visit: 02/16/2019    PATIENT IDENTIFIER: [name] is a 57 y.o. female with a past medical history of HTN, OSA,T2DM,and obesity, Ovarian cancer(S/P surgery )  and melanoma. The patient has followed with Endocrinology clinic since 02/16/2019 for consultative assistance with management of her diabetes.  DIABETIC HISTORY:  [name] was diagnosed with T2DM in 2000. She has been on Metformin, Glipizide and Actos since her diagnosis. Her hemoglobin A1c has ranged from 6.8% in 2018, peaking at 8.3% in 2020.  Rybelsus added in 01/2019 Pioglitazone stopped 05/2019  We attempted to discontinue glipizide in November 2022 due to hypoglycemia and an A1c of 6.1%, but this had to be restarted in May 2023 with an A1c 8.6%  Glimepiride was discontinued 03/2022 due to hypoglycemia, was restarted 07/2022 with an A1c of 8.1%  Normal 24-hr urinary cortisol 08/2020  SUBJECTIVE:   During the last visit (08/11/2022): A1c 8.1 %.    Today (02/06/2023): [name] is here for a  follow up on diabetes management.   She checks her blood sugars occasionally.   Unfortunately, she was terminated from her job and has been depressed, she was admitted to behavioral health for approximately 4 days but discharged herself as she did not feel that this was helpful.  She continues to feel down and depressed, she has an upcoming appointment with a counselor 02/25/2023  She follows  with pulmo for OSA   Denies nausea or vomiting  Denies constipation or diarrhea     HOME DIABETES REGIMEN:  Metformin 500 mg XR  ,1  tablet twice a day  Glimepiride 1 mg daily Rybelsus 14 mg daily    METER DOWNLOAD SUMMARY: unable to download 90 day average 108  71- 149 mg/dL    DIABETIC  COMPLICATIONS: Microvascular complications:    Denies: retinopathy, CKD, neuropathy  Last eye exam: scheduled 10/2021   Macrovascular complications:    Denies: CAD, PVD, CVA   HISTORY:  Past Medical History:  Past Medical History:  Diagnosis Date   Anal fissure    saw Dr. Loretha Brasil    Anxiety    on meds   Arthritis    on meds   Asthma    uses inhaler if needed   Carpal tunnel syndrome    Depression    on meds   Diabetes mellitus    on meds   GERD (gastroesophageal reflux disease)    on meds   History of ovarian cancer in adulthood    Hyperlipidemia    on meds   Hypertension    on meds   Ovarian cancer [facility]) 1986   1996   Skin cancer (melanoma) (HCC) 1997   Sleep apnea    uses CPAP    Tubular adenoma of colon 01/2014   Past Surgical History:  Past Surgical History:  Procedure Laterality Date   ABDOMINAL HYSTERECTOMY  06/30/1984   BSO   BASAL CELL CARCINOMA EXCISION  10/09/2012   lower lip per Dr. Park Liter    COLONOSCOPY  03/23/2019   per Dr. Adela Lank, adenomatous polyps, repeat in 3 yrs   GLBS tumor ovary 1986     HEMORRHOID SURGERY     HIP FRACTURE SURGERY Right 2007   MELANOMA EXCISION  07/01/1995  upper chest    Social History:  reports that she has never smoked. She has never used smokeless tobacco. She reports that she does not drink alcohol and does not use drugs. Family History:  Family History  Problem Relation Age of Onset   Dementia Mother    Asthma Mother    Multiple sclerosis Father    Uterine cancer Sister 19   Diabetes Brother    Diabetes Brother    Basal cell carcinoma Brother        lower lip   Diabetes Maternal Grandfather    Colon polyps Paternal Grandmother    Stomach cancer Paternal Grandmother        dx 6s-80s   Colon polyps Paternal Grandfather    Arthritis Other    Prostate cancer Other        grandfather per pt but she is unaware of which grandfather   Coronary artery disease Other    Pancreatic cancer Neg  Hx    Esophageal cancer Neg Hx    Rectal cancer Neg Hx      HOME MEDICATIONS: Allergies as of 02/06/2023       Reactions   Lisinopril Cough        Medication List        Accurate as of February 06, 2023  8:58 AM. If you have any questions, ask your nurse or doctor.          albuterol 108 (90 Base) MCG/ACT inhaler Commonly known as: VENTOLIN HFA Inhale 2 puffs into the lungs every 6 (six) hours as needed for wheezing or shortness of breath.   ALPRAZolam 1 MG tablet Commonly known as: XANAX Take 1 tablet (1 mg total) by mouth at bedtime as needed for sleep.   atorvastatin 80 MG tablet Commonly known as: LIPITOR Take 1 tablet (80 mg total) by mouth daily. What changed: when to take this   glimepiride 1 MG tablet Commonly known as: AMARYL Take 1 tablet (1 mg total) by mouth daily with breakfast.   losartan-hydrochlorothiazide 50-12.5 MG tablet Commonly known as: HYZAAR Take 1 tablet by mouth daily. What changed: when to take this   metFORMIN 500 MG 24 hr tablet Commonly known as: GLUCOPHAGE-XR Take 1 tablet (500 mg total) by mouth in the morning and at bedtime.   metoprolol tartrate 50 MG tablet Commonly known as: LOPRESSOR Take 1 tablet (50 mg total) by mouth 2 (two) times daily.   omeprazole 40 MG capsule Commonly known as: PRILOSEC Take 1 capsule (40 mg total) by mouth 2 (two) times daily.   Rybelsus 14 MG Tabs Generic drug: Semaglutide Take 1 tablet (14 mg total) by mouth daily.   venlafaxine XR 150 MG 24 hr capsule Commonly known as: EFFEXOR-XR Take 150 mg by mouth daily.         OBJECTIVE:   Vital Signs: BP 110/68 (BP Location: Left Arm, Patient Position: Sitting, Cuff Size: Large)   Pulse 74   Ht 5\' 4"  (1.626 m)   Wt 205 lb (93 kg)   LMP 03/22/1986   SpO2 99%   BMI 35.19 kg/m   Wt Readings from Last 3 Encounters:  02/06/23 205 lb (93 kg)  11/18/22 200 lb (90.7 kg)  10/23/22 206 lb (93.4 kg)     Exam: General: Pt appears well and  is in NAD  Lungs: Clear with good BS bilat   Heart: RRR   Extremities: No pretibial edema.  Neuro: MS is good with appropriate affect, pt is alert  and Ox3      DM Foot exam 02/06/2023  The skin of the feet is intact without sores or ulcerations. The pedal pulses are 1+ on right and 1+ on left. The sensation is absent  to a screening 5.07, 10 gram monofilament bilaterally    DATA REVIEWED:  Lab Results  Component Value Date   HGBA1C 5.9 (H) 12/29/2022   HGBA1C 8.1 (A) 08/11/2022   HGBA1C 6.5 (A) 04/08/2022     Latest Reference Range & Units 09/30/21 16:33  Total CHOL/HDL Ratio  4  Cholesterol 0 - 200 mg/dL 295  HDL Cholesterol >62.13 mg/dL 08.65  Direct LDL mg/dL 78.4  NonHDL  696.29  Triglycerides 0.0 - 149.0 mg/dL 528.4 (H)  VLDL 0.0 - 13.2 mg/dL 44.0 (H)    Latest Reference Range & Units 09/30/21 16:33  Sodium 135 - 145 mEq/L 141  Potassium 3.5 - 5.1 mEq/L 3.5  Chloride 96 - 112 mEq/L 99  CO2 19 - 32 mEq/L 32  Glucose 70 - 99 mg/dL 102 (H)  BUN 6 - 23 mg/dL 20  Creatinine 7.25 - 3.66 mg/dL 4.40  Calcium 8.4 - 34.7 mg/dL 9.9  Alkaline Phosphatase 39 - 117 U/L 54  Albumin 3.5 - 5.2 g/dL 4.5  AST 0 - 37 U/L 17  ALT 0 - 35 U/L 16  Total Protein 6.0 - 8.3 g/dL 7.3  Bilirubin, Direct 0.0 - 0.3 mg/dL 0.1  Total Bilirubin 0.2 - 1.2 mg/dL 0.5  GFR >42.59 mL/min 72.67      ASSESSMENT / PLAN / RECOMMENDATIONS:   1) Type 2 Diabetes Mellitus, Optimally  Controlled, With Neuropathic  complications - Most recent A1c of 5.9 %. Goal A1c < 7.0 %.   -Her labs were reviewed through PCPs office with an A1c of 5.9% -I will decrease her metformin as below, but historically she has been intolerant to more than 1000 mg daily dose -No changes to glimepiride and Rybelsus at this time  MEDICATIONS:  -Continue Glimepiride 1 mg daily  -Decrease Metformin 500 mg XR, 1 tablet BID   -Continue Rybelsus 14 mg daily    EDUCATION / INSTRUCTIONS: BG monitoring instructions: Patient is  instructed to check her blood sugars 3 times a week, fasting. Call [facility] if: BG persistently < 70  I reviewed the Rule of 15 for the treatment of hypoglycemia in detail with the patient. Literature supplied.    2) Diabetic complications:  Eye: Does not  have known diabetic retinopathy.  Neuro/ Feet: Does have known diabetic peripheral neuropathy. Renal: Patient does not have known baseline CKD.         F/U in 6 months    Signed electronically by: Lyndle Herrlich, MD  [facility] Endocrinology  [facility] [address] [place] [place], [place] [address] Phone: [phone] FAX: [phone]   CC: Nelwyn Salisbury, MD [address] [place] [address] Phone: [phone]  Fax: [phone]  Return to Endocrinology clinic as below: Future Appointments  Date Time Provider Department Center  02/06/2023  2:40 PM , Konrad Dolores, MD [facility] None  02/10/2023  9:30 AM Glenford Bayley, NP LBPU-PULCARE None  02/11/2023  1:00 PM Gustavus Bryant, LCSW [facility]L-POP[facility] None  02/25/2023  9:00 AM Thresa Ross, MD BH-BHKA None  03/10/2023  8:00 AM Mollie Germany, LCSW BH-BHKA None

## 2023-02-10 ENCOUNTER — Encounter: Payer: Self-pay | Admitting: Family Medicine

## 2023-02-10 ENCOUNTER — Ambulatory Visit: Payer: PRIVATE HEALTH INSURANCE | Admitting: Primary Care

## 2023-02-10 MED ORDER — LAMOTRIGINE 25 MG PO TABS: 25.0000 mg | ORAL_TABLET | Freq: Every day | ORAL | 2 refills | Status: DC

## 2023-02-10 NOTE — Telephone Encounter (Signed)
I understand. Tell her to keep taking the Effexor daily, but we will add Lamotrigine 25 mg daily to it. This should help the Effexor work better. After 2 weeks we can increase the Lamotrigine to 50 mg daily if needed

## 2023-02-11 ENCOUNTER — Other Ambulatory Visit: Payer: PRIVATE HEALTH INSURANCE | Admitting: Licensed Clinical Social Worker

## 2023-02-11 ENCOUNTER — Encounter: Payer: Self-pay | Admitting: Family Medicine

## 2023-02-11 ENCOUNTER — Ambulatory Visit: Payer: PRIVATE HEALTH INSURANCE | Admitting: Licensed Clinical Social Worker

## 2023-02-11 DIAGNOSIS — H5213 Myopia, bilateral: Secondary | ICD-10-CM | POA: Diagnosis not present

## 2023-02-11 NOTE — Patient Outreach (Signed)
Medicaid Managed Care Social Work Note  02/11/2023 Name:  Morgan [name] MRN:  [id number] DOB:  January 12, 1966  Morgan [name] is an 57 y.o. year old female who is a primary patient of Morgan Ridges Tera Mater, MD.  The Medicaid Managed Care Coordination team was consulted for assistance with:  Mental Health Counseling and Resources  Morgan [name] was given information about Medicaid Managed Care Coordination team services today. Morgan [name] Patient agreed to services and verbal consent obtained.  Engaged with patient  for by telephone forfollow up visit in response to referral for case management and/or care coordination services.   Assessments/Interventions:  Review of past medical history, allergies, medications, health status, including review of consultants reports, laboratory and other test data, was performed as part of comprehensive evaluation and provision of chronic care management services.  SDOH: (Social Determinant of Health) assessments and interventions performed: SDOH Interventions    Flowsheet Row Patient Outreach Telephone from 02/11/2023 in [place] POPULATION HEALTH DEPARTMENT Patient Outreach Telephone from 01/28/2023 in [place] POPULATION HEALTH DEPARTMENT Community Documentation from 12/20/2022 in [facility]  SDOH Interventions     Food Insecurity Interventions -- -- Other (Comment)  [Food resources given]  Housing Interventions -- -- Other (Comment)  [Housing resources given]  Transportation Interventions -- -- Intervention Not Indicated  Depression Interventions/Treatment  -- Referral to Psychiatry  [Resources provided] --  Stress Interventions Offered [employer], Provide Counseling -- --       Advanced Directives Status:  See Care Plan for related entries.  Care Plan                 Allergies  Allergen Reactions   Lisinopril Cough    Medications Reviewed Today   Medications were not reviewed in this  encounter     Patient Active Problem List   Diagnosis Date Noted   Genetic testing 01/02/2023   MDD (major depressive disorder), recurrent episode, severe (HCC) 12/29/2022   MDD (major depressive disorder), severe (HCC) 12/29/2022   History of ovarian cancer in adulthood 12/15/2022   Campylobacter diarrhea 09/24/2022   Hypokalemia 09/22/2022   Actinic skin damage 09/22/2022   Acute gastroenteritis 09/22/2022   Carpal tunnel syndrome, left upper limb 08/26/2021   Carpal tunnel syndrome, right upper limb 08/26/2021   Carpal tunnel syndrome on both sides 08/02/2021   Asthma 01/09/2021   Vitamin B12 deficiency 09/05/2020   Dyslipidemia 09/04/2020   Neuropathy 09/04/2020   Mixed dyslipidemia 09/04/2020   COVID-19 virus infection 06/19/2020   Type 2 diabetes mellitus with diabetic polyneuropathy, without long-term current use of insulin (HCC) 08/25/2019   Type 2 diabetes mellitus with hyperglycemia, without long-term current use of insulin (HCC) 02/17/2019   Anxiety and depression 02/17/2019   BMI 45.0-49.9, adult (HCC) 02/04/2019   Ringing in ears, bilateral 12/29/2017   OSA on CPAP 01/06/2017   Witnessed episode of apnea 07/03/2016   Diverticulitis of colon 11/30/2014   IBS (irritable bowel syndrome) 04/21/2013   Chronic cough 11/26/2011   External hemorrhoids 04/04/2010   UNSPECIFIED THROMBOSED HEMORRHOIDS 01/08/2009   GERD 08/23/2008   Diabetes mellitus without complication (HCC) 01/22/2007   Hyperlipidemia 01/22/2007   Essential hypertension 01/22/2007    Conditions to be addressed/monitored per PCP order:  Depression  Care Plan : LCSW Plan of Care  Updates made by Gustavus Bryant, LCSW since 02/11/2023 12:00 AM     Problem: Depression Identification (Depression)      Goal: Depressive Symptoms Identified   Start  Date: 01/28/2023  Note:   Priority: High  Timeframe:  Short-Range Goal Priority:  High Start Date:   01/28/23           Expected End Date:  ongoing                      Follow Up Date-- 02/26/23 at 11 am.  - keep 90 percent of scheduled appointments -consider counseling or psychiatry -consider bumping up your self-care  -consider creating a stronger support network   Why is this important?             Combatting stress may take some time.            If you don't feel better right away, don't give up on your treatment plan.    Current barriers:   Chronic Mental Health needs related to depression and anxiety. Patient requires Support, Education, Resources, Referrals, Advocacy, and Care Coordination, in order to meet Unmet Mental Health Needs to Find a Therapist and Psychiatrist. Patient will implement clinical interventions discussed today to decrease symptoms of depression and increase knowledge and/or ability of: coping skills. Mental Health Concerns and Social Isolation Recent job loss Recent inpatient BH stay Loss of stable housing due to family conflict  Patient lacks knowledge of available community counseling agencies and resources.  Clinical Goal(s): verbalize understanding of plan for management of Anger, Grief, Anxiety and Depression, and demonstrate a reduction in symptoms. Patient will connect with a provider for ongoing mental health treatment, increase coping skills, healthy habits, self-management skills, and stress reduction        Clinical Interventions:  Assessed patient's previous and current treatment, coping skills, support system and barriers to care. Patient provided hx. Recent inpatient BH stay at [facility].  Verbalization of feelings encouraged, motivational interviewing employed Emotional support provided, positive coping strategies explored. Establishing healthy boundaries emphasized and healthy self-care education provided Patient was educated on available mental health resources within their area that accept Medicaid and offer counseling and psychiatry. Patient reports having Medicaid but this has not been confirmed  yet.  Patient is agreeable to referral to [facility] for counseling and psychiatry. [facility] LCSW made referral on 01/28/23. Email sent to patient today with available mental health resources within her area that accept Medicaid and offer the services that she is interested in. Email included instructions for scheduling at [facility] as well as some crisis support resources and [facility]'s walk in clinic hours.  Emotional support provided. CBT intervention implemented regarding "being mentally fit" by combating negative thinking and replacing it with uplifting support, hope and positivity. Patient reports significant worsening anger impacting their ability to function appropriately and carry out daily task (recent job loss). Patient receives strong support from sister and niece. LCSW provided education on relaxation techniques such as meditation, deep breathing, massage, grounding exercises or yoga that can activate the body's relaxation response and ease symptoms of stress and anxiety. LCSW ask that when pt is struggling with difficult emotions and racing thoughts that they start this relaxation response process. LCSW provided extensive education on healthy coping skills for anxiety. SW used active and reflective listening, validated patient's feelings/concerns, and provided emotional support. Motivational Interviewing employed Depression screen reviewed  PHQ2/ PHQ9 completed or reviewed  Mindfulness or Relaxation training provided Active listening / Reflection utilized  Advance Care and HCPOA education provided Emotional Support Provided Problem Solving /Task Center strategies reviewed Provided psychoeducation for mental health needs  Provided brief CBT  Reviewed mental health medications  and discussed importance of compliance:  Quality of sleep assessed & Sleep Hygiene techniques promoted  Participation in counseling encouraged  Verbalization of feelings encouraged  Suicidal Ideation/Homicidal Ideation assessed:  Patient denies SI/HI  Review resources, discussed options and provided patient information about  Mental Health Resources Inter-disciplinary care team collaboration (see longitudinal plan of care) Update- [facility] LCSW reports recent suicidal thoughts due to the loss of her job even after applying for 52 different jobs. Patient immediately updated her PCP and he adjusted her medication--"please tell her to keep taking the Effexor daily, but we will add Lamotrigine 25 mg daily to it. This should help the Effexor work better. After 2 weeks we can increase the Lamotrigine to 50 mg daily if needed" Patient reports successfully starting this medication today. Patient has upcoming appointments on 02/24/23 and 02/25/23 at [facility]. Patient was advised to go back to [facility] if her thinking starts to get suicidal again. Patient was educated on the crisis support number 20 again today and she agreed to save it in her phone. Patient was encouraged to keep in contact with her church, friends and family members during this time.   Patient Goals/Self-Care Activities: Over the next 120 days Attend scheduled medical appointments Utilize healthy coping skills and supportive resources discussed Contact PCP with any questions or concerns Keep 90 percent of counseling appointments Call your insurance provider for more information about your Enhanced Benefits  Check out counseling resources provided  Begin personal counseling with LCSW, to reduce and manage symptoms of Depression and Stress, until well-established with mental health provider Accept all calls from representative with [facility] in an effort to establish ongoing mental health counseling and supportive services. Incorporate into daily practice - relaxation techniques, deep breathing exercises, and mindfulness meditation strategies. Talk about feelings with friends, family members,  spiritual advisor, etc. Contact LCSW directly [phone]), if you have questions, need assistance, or if additional social work needs are identified between now and our next scheduled telephone outreach call. Call 988 for mental health hotline/crisis line if needed (24/7 available) Try techniques to reduce symptoms of anxiety/negative thinking (deep breathing, distraction, positive self talk, etc)  - develop a personal safety plan - develop a plan to deal with triggers like holidays, anniversaries - exercise at least 2 to 3 times per week - have a plan for how to handle bad days - journal feelings and what helps to feel better or worse - spend time or talk with others at least 2 to 3 times per week - watch for early signs of feeling worse - begin personal counseling - call and visit an old friend - check out volunteer opportunities - join a support group - laugh; watch a funny movie or comedian - learn and use visualization or guided imagery - perform a random act of kindness - practice relaxation or meditation daily - start or continue a personal journal - practice positive thinking and self-talk -continue with compliance of taking medication  -identify current effective and ineffective coping strategies.  -implement positive self-talk in care to increase self-esteem, confidence and feelings of control.  -consider alternative and complementary therapy approaches such as meditation, mindfulness or yoga.  Call your insurance provider to gain education on benefits if desired Call your primary care doctor if symptoms get worse  -journaling, prayer, worship services, meditation or pastoral counseling.  -increase participation in pleasurable group activities such as hobbies, singing, sports or volunteering).  -consider the  use of meditative movement therapy such as tai chi, yoga or qigong.  -start a regular daily exercise program based on tolerance, ability and patient choice to support  positive thinking and activity    If you are experiencing a Mental Health or Behavioral Health Crisis or need someone to talk to, please call the Suicide and Crisis Lifeline: 988      24- Hour Availability:    [facility]  [address] [place], [place] [phone] Crisis [phone]   [facility] [phone]  [place] [facility]  [phone]    [facility]  [phone] (after hours)   Therapeutic Alternative/Mobile Crisis   [phone]   [place] National Suicide Hotline  229-838-8936 [place]) [place] [address]   Call (848) [phone] for mental health emergencies   [facility]  [phone]);  [place] and [employer]  [phone]); [place], [place], [place], [place], [place], [place], [place]    [place] [facility] for [facility] Residents For 24/7 walk-up access to mental health services for [facility] children (4+), adolescents and adults, please visit the [facility] located at [address] in [place], [place].  *[place] also provides comprehensive outpatient behavioral health services in a variety of locations around the [place].  Connect With [place] [address] [place], [place] [address] HelpLine: 754-033-7947 or 1-239-620-2327  Get Directions  Find Help 24/7 By Phone Call our 24-hour HelpLine at 9801827236 or 260-645-6715 for immediate assistance for mental health and substance abuse issues.  Walk-In Help [place]: [facility] (Ages 4 and Up) [place]: Emergency Dept., [facility] Additional Resources National Hopeline Network: 1-800-SUICIDE The National Suicide Prevention Lifeline: 0-938-182-XHBZ     These resources were emailed to patient again on 02/11/23^      Follow up:  Patient agrees to Care Plan and Follow-up.  Plan: The  Managed Medicaid care management team will reach out to the patient again over the next 30 days.  Dickie La, BSW, MSW, [employer] Managed Medicaid LCSW [facility]  [facility] [email or web address] Phone: [phone]

## 2023-02-11 NOTE — Telephone Encounter (Signed)
That's great to hear.

## 2023-02-11 NOTE — Patient Instructions (Signed)
Visit Information  Morgan [name] was given information about Medicaid Managed Care team care coordination services as a part of their [facility] Community Plan Medicaid benefit. Morgan [name] verbally consented to engagement with the [facility] Managed Care team.   If you are experiencing a medical emergency, please call 911 or report to your local emergency department or urgent care.   If you have a non-emergency medical problem during routine business hours, please contact your provider's office and ask to speak with a nurse.   For questions related to your [facility], please call: [phone] or visit the homepage here: [email or web address]  If you would like to schedule transportation through your [facility], please call the following number at least 2 days in advance of your appointment: [phone]   Rides for urgent appointments can also be made after hours by calling Member Services.  Call the Behavioral Health Crisis Line at [phone], at any time, 24 hours a day, 7 days a week. If you are in danger or need immediate medical attention call 911.  If you would like help to quit smoking, call 1-800-QUIT-NOW (513-230-5377) OR Espaol: 1-855-Djelo-Ya (2-536-644-0347) o para ms informacin haga clic aqu or Text READY to 425-956 to register via text  Following is a copy of your plan of care:  Care Plan : LCSW Plan of Care  Updates made by Morgan Bryant, LCSW since 02/11/2023 12:00 AM     Problem: Depression Identification (Depression)      Goal: Depressive Symptoms Identified   Start Date: 01/28/2023  Note:   Priority: High  Timeframe:  Short-Range Goal Priority:  High Start Date:   01/28/23           Expected End Date:  ongoing                     Follow Up Date-- 02/11/23 at 1 pm  - keep 90 percent of scheduled appointments -consider counseling or  psychiatry -consider bumping up your self-care  -consider creating a stronger support network   Why is this important?             Combatting stress may take some time.            If you don't feel better right away, don't give up on your treatment plan.    Current barriers:   Chronic Mental Health needs related to depression and anxiety. Patient requires Support, Education, Resources, Referrals, Advocacy, and Care Coordination, in order to meet Unmet Mental Health Needs to Find a Therapist and Psychiatrist. Patient will implement clinical interventions discussed today to decrease symptoms of depression and increase knowledge and/or ability of: coping skills. Mental Health Concerns and Social Isolation Recent job loss Recent inpatient BH stay Loss of stable housing due to family conflict  Patient lacks knowledge of available community counseling agencies and resources.  Clinical Goal(s): verbalize understanding of plan for management of Anger, Grief, Anxiety and Depression, and demonstrate a reduction in symptoms. Patient will connect with a provider for ongoing mental health treatment, increase coping skills, healthy habits, self-management skills, and stress reduction        Patient Goals/Self-Care Activities: Over the next 120 days Attend scheduled medical appointments Utilize healthy coping skills and supportive resources discussed Contact PCP with any questions or concerns Keep 90 percent of counseling appointments Call your insurance provider for more information about your Enhanced Benefits  Check out counseling resources provided  Begin personal  counseling with LCSW, to reduce and manage symptoms of Depression and Stress, until well-established with mental health provider Accept all calls from representative with [facility] in an effort to establish ongoing mental health counseling and supportive services. Incorporate into daily practice - relaxation techniques, deep breathing  exercises, and mindfulness meditation strategies. Talk about feelings with friends, family members, spiritual advisor, etc. Contact LCSW directly [phone]), if you have questions, need assistance, or if additional social work needs are identified between now and our next scheduled telephone outreach call. Call 988 for mental health hotline/crisis line if needed (24/7 available) Try techniques to reduce symptoms of anxiety/negative thinking (deep breathing, distraction, positive self talk, etc)  - develop a personal safety plan - develop a plan to deal with triggers like holidays, anniversaries - exercise at least 2 to 3 times per week - have a plan for how to handle bad days - journal feelings and what helps to feel better or worse - spend time or talk with others at least 2 to 3 times per week - watch for early signs of feeling worse - begin personal counseling - call and visit an old friend - check out volunteer opportunities - join a support group - laugh; watch a funny movie or comedian - learn and use visualization or guided imagery - perform a random act of kindness - practice relaxation or meditation daily - start or continue a personal journal - practice positive thinking and self-talk -continue with compliance of taking medication  -identify current effective and ineffective coping strategies.  -implement positive self-talk in care to increase self-esteem, confidence and feelings of control.  -consider alternative and complementary therapy approaches such as meditation, mindfulness or yoga.  Call your insurance provider to gain education on benefits if desired Call your primary care doctor if symptoms get worse  -journaling, prayer, worship services, meditation or pastoral counseling.  -increase participation in pleasurable group activities such as hobbies, singing, sports or volunteering).  -consider the use of meditative movement therapy such as tai chi, yoga or qigong.   -start a regular daily exercise program based on tolerance, ability and patient choice to support positive thinking and activity    If you are experiencing a Mental Health or Behavioral Health Crisis or need someone to talk to, please call the Suicide and Crisis Lifeline: 82    Dickie La, BSW, MSW, [employer] Managed Medicaid LCSW [facility]  [email or web address] Phone: [phone]

## 2023-02-11 NOTE — Telephone Encounter (Signed)
Spoke with pt reviewed the questions regarding her new Rx

## 2023-02-12 ENCOUNTER — Ambulatory Visit: Payer: PRIVATE HEALTH INSURANCE | Admitting: Licensed Clinical Social Worker

## 2023-02-20 ENCOUNTER — Telehealth: Payer: Self-pay

## 2023-02-20 ENCOUNTER — Encounter: Payer: Self-pay | Admitting: Internal Medicine

## 2023-02-20 NOTE — Telephone Encounter (Signed)
Hello.  [employer] said, this drug isn't on their preferred drug list or requires prior authorization  (approval).     A prior authentication is an approval your doctor gets from [facility] before I can get the drug.   [employer] letter saids, if my doctor wants me to keep taking Rybelsus tab they will    need to ask  [facility] for prior authenticatio .    If the prior authorization is approved , [employer] will continue to cover this drug.    From [facility] [place]::  I have about 5 boxes of Rybelsus. Right now as Aug. 21st. 2024.so I'm good on pills.    Let me know if you require anything else from me. I got this letter from [facility]  02/19/23.   Toll free no.  [phone] ID NUMBER.  :  [id number] s

## 2023-02-22 ENCOUNTER — Encounter (HOSPITAL_COMMUNITY): Payer: Self-pay | Admitting: Licensed Clinical Social Worker

## 2023-02-23 ENCOUNTER — Other Ambulatory Visit (HOSPITAL_COMMUNITY): Payer: Self-pay

## 2023-02-24 ENCOUNTER — Ambulatory Visit (INDEPENDENT_AMBULATORY_CARE_PROVIDER_SITE_OTHER): Payer: Medicaid Other | Admitting: Licensed Clinical Social Worker

## 2023-02-24 DIAGNOSIS — F411 Generalized anxiety disorder: Secondary | ICD-10-CM

## 2023-02-24 DIAGNOSIS — F332 Major depressive disorder, recurrent severe without psychotic features: Secondary | ICD-10-CM | POA: Diagnosis not present
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
## 2023-02-25 ENCOUNTER — Encounter (HOSPITAL_COMMUNITY): Payer: Self-pay

## 2023-02-25 ENCOUNTER — Ambulatory Visit (HOSPITAL_COMMUNITY): Payer: Medicaid Other | Admitting: Psychiatry

## 2023-02-26 ENCOUNTER — Other Ambulatory Visit: Payer: Medicaid Other | Admitting: Licensed Clinical Social Worker

## 2023-02-26 NOTE — Patient Instructions (Signed)
Visit Information  [name] was given information about Medicaid Managed Care team care coordination services as a part of their [facility] Community [facility] Medicaid benefit. Morgan [name] verbally consented to engagement with the [facility] Managed Care team.   If you are experiencing a medical emergency, please call 911 or report to your local emergency department or urgent care.   If you have a non-emergency medical problem during routine business hours, please contact your provider's office and ask to speak with a nurse.   For questions related to your [facility], please call: [phone] or visit the homepage here: [email or web address]  If you would like to schedule transportation through your [facility], please call the following number at least 2 days in advance of your appointment: [phone]   Rides for urgent appointments can also be made after hours by calling Member Services.  Call the Behavioral Health Crisis Line at [phone], at any time, 24 hours a day, 7 days a week. If you are in danger or need immediate medical attention call 911.  If you would like help to quit smoking, call 1-800-QUIT-NOW ((234)283-0517) OR Espaol: 1-855-Djelo-Ya (0-160-109-3235) o para ms informacin haga clic aqu or Text READY to 573-220 to register via text   Following is a copy of your plan of care:  Care Plan : LCSW Plan of Care  Updates made by Gustavus Bryant, LCSW since 02/26/2023 12:00 AM     Problem: Depression Identification (Depression)      Goal: Depressive Symptoms Identified   Start Date: 01/28/2023  Note:   Priority: High  Timeframe:  Short-Range Goal Priority:  High Start Date:   01/28/23           Expected End Date:  ongoing                     Follow Up Date-- 03/25/23 at 11 am  - keep 90 percent of scheduled appointments -consider counseling  or psychiatry -consider bumping up your self-care  -consider creating a stronger support network   Why is this important?             Combatting stress may take some time.            If you don't feel better right away, don't give up on your treatment plan.    Current barriers:   Chronic Mental Health needs related to depression and anxiety. Patient requires Support, Education, Resources, Referrals, Advocacy, and Care Coordination, in order to meet Unmet Mental Health Needs to Find a Therapist and Psychiatrist. Patient will implement clinical interventions discussed today to decrease symptoms of depression and increase knowledge and/or ability of: coping skills. Mental Health Concerns and Social Isolation Recent job loss Recent inpatient BH stay Loss of stable housing due to family conflict  Patient lacks knowledge of available community counseling agencies and resources.  Clinical Goal(s): verbalize understanding of plan for management of Anger, Grief, Anxiety and Depression, and demonstrate a reduction in symptoms. Patient will connect with a provider for ongoing mental health treatment, increase coping skills, healthy habits, self-management skills, and stress reduction        Patient Goals/Self-Care Activities: Over the next 120 days Attend scheduled medical appointments Utilize healthy coping skills and supportive resources discussed Contact PCP with any questions or concerns Keep 90 percent of counseling appointments Call your insurance provider for more information about your Enhanced Benefits  Check out counseling resources provided  Begin  personal counseling with LCSW, to reduce and manage symptoms of Depression and Stress, until well-established with mental health provider Accept all calls from representative with [facility] in an effort to establish ongoing mental health counseling and supportive services. Incorporate into daily practice - relaxation techniques, deep breathing  exercises, and mindfulness meditation strategies. Talk about feelings with friends, family members, spiritual advisor, etc. Contact LCSW directly [phone]), if you have questions, need assistance, or if additional social work needs are identified between now and our next scheduled telephone outreach call. Call 988 for mental health hotline/crisis line if needed (24/7 available) Try techniques to reduce symptoms of anxiety/negative thinking (deep breathing, distraction, positive self talk, etc)  - develop a personal safety plan - develop a plan to deal with triggers like holidays, anniversaries - exercise at least 2 to 3 times per week - have a plan for how to handle bad days - journal feelings and what helps to feel better or worse - spend time or talk with others at least 2 to 3 times per week - watch for early signs of feeling worse - begin personal counseling - call and visit an old friend - check out volunteer opportunities - join a support group - laugh; watch a funny movie or comedian - learn and use visualization or guided imagery - perform a random act of kindness - practice relaxation or meditation daily - start or continue a personal journal - practice positive thinking and self-talk -continue with compliance of taking medication  -identify current effective and ineffective coping strategies.  -implement positive self-talk in care to increase self-esteem, confidence and feelings of control.  -consider alternative and complementary therapy approaches such as meditation, mindfulness or yoga.  Call your insurance provider to gain education on benefits if desired Call your primary care doctor if symptoms get worse  -journaling, prayer, worship services, meditation or pastoral counseling.  -increase participation in pleasurable group activities such as hobbies, singing, sports or volunteering).  -consider the use of meditative movement therapy such as tai chi, yoga or qigong.   -start a regular daily exercise program based on tolerance, ability and patient choice to support positive thinking and activity    If you are experiencing a Mental Health or Behavioral Health Crisis or need someone to talk to, please call the Suicide and Crisis Lifeline: 988    Patient Goals: Follow up goal   Dickie La, BSW, MSW, [employer] Managed Medicaid LCSW [facility]  [facility] [email or web address] Phone: [phone]

## 2023-02-26 NOTE — Patient Outreach (Signed)
Medicaid Managed Care Social Work Note  02/26/2023 Name:  Morgan [name] MRN:  [id number] DOB:  05-Oct-1965  Morgan [name] is an 57 y.o. year old female who is a primary patient of Morgan Ridges Tera Mater, MD.  The Medicaid Managed Care Coordination team was consulted for assistance with:  Mental Health Counseling and Resources  Ms. Callard was given information about Medicaid Managed Care Coordination team services today. Morgan [name] Patient agreed to services and verbal consent obtained.  Engaged with patient  for by telephone forfollow up visit in response to referral for case management and/or care coordination services.   Assessments/Interventions:  Review of past medical history, allergies, medications, health status, including review of consultants reports, laboratory and other test data, was performed as part of comprehensive evaluation and provision of chronic care management services.  SDOH: (Social Determinant of Health) assessments and interventions performed: SDOH Interventions    Flowsheet Row Counselor from 02/24/2023 in [facility] Patient Outreach Telephone from 02/11/2023 in [place] POPULATION HEALTH DEPARTMENT Patient Outreach Telephone from 01/28/2023 in [place] POPULATION HEALTH DEPARTMENT Community Documentation from 12/20/2022 in [facility]  SDOH Interventions      Food Insecurity Interventions -- -- -- Other (Comment)  [Food resources given]  Housing Interventions -- -- -- Other (Comment)  [Housing resources given]  Transportation Interventions -- -- -- Intervention Not Indicated  Depression Interventions/Treatment  Counseling, Medication -- Referral to Psychiatry  [Resources provided] --  Stress Interventions -- [employer], Provide Counseling -- --       Advanced Directives Status:  See Care Plan for related entries.  Care Plan                  Allergies  Allergen Reactions   Lisinopril Cough    Medications Reviewed Today   Medications were not reviewed in this encounter     Patient Active Problem List   Diagnosis Date Noted   Genetic testing 01/02/2023   MDD (major depressive disorder), recurrent episode, severe (HCC) 12/29/2022   MDD (major depressive disorder), severe (HCC) 12/29/2022   History of ovarian cancer in adulthood 12/15/2022   Campylobacter diarrhea 09/24/2022   Hypokalemia 09/22/2022   Actinic skin damage 09/22/2022   Acute gastroenteritis 09/22/2022   Carpal tunnel syndrome, left upper limb 08/26/2021   Carpal tunnel syndrome, right upper limb 08/26/2021   Carpal tunnel syndrome on both sides 08/02/2021   Asthma 01/09/2021   Vitamin B12 deficiency 09/05/2020   Dyslipidemia 09/04/2020   Neuropathy 09/04/2020   Mixed dyslipidemia 09/04/2020   COVID-19 virus infection 06/19/2020   Type 2 diabetes mellitus with diabetic polyneuropathy, without long-term current use of insulin (HCC) 08/25/2019   Type 2 diabetes mellitus with hyperglycemia, without long-term current use of insulin (HCC) 02/17/2019   Anxiety and depression 02/17/2019   BMI 45.0-49.9, adult (HCC) 02/04/2019   Ringing in ears, bilateral 12/29/2017   OSA on CPAP 01/06/2017   Witnessed episode of apnea 07/03/2016   Diverticulitis of colon 11/30/2014   IBS (irritable bowel syndrome) 04/21/2013   Chronic cough 11/26/2011   External hemorrhoids 04/04/2010   UNSPECIFIED THROMBOSED HEMORRHOIDS 01/08/2009   GERD 08/23/2008   Diabetes mellitus without complication (HCC) 01/22/2007   Hyperlipidemia 01/22/2007   Essential hypertension 01/22/2007    Conditions to be addressed/monitored per PCP order:  Depression  Care Plan : LCSW Plan of Care  Updates made by Gustavus Bryant, LCSW since 02/26/2023 12:00 AM  Problem: Depression Identification (Depression)      Goal: Depressive Symptoms Identified   Start Date: 01/28/2023  Note:    Priority: High  Timeframe:  Short-Range Goal Priority:  High Start Date:   01/28/23           Expected End Date:  ongoing                     Follow Up Date-- 03/25/23 at 11 am  - keep 90 percent of scheduled appointments -consider counseling or psychiatry -consider bumping up your self-care  -consider creating a stronger support network   Why is this important?             Combatting stress may take some time.            If you don't feel better right away, don't give up on your treatment plan.    Current barriers:   Chronic Mental Health needs related to depression and anxiety. Patient requires Support, Education, Resources, Referrals, Advocacy, and Care Coordination, in order to meet Unmet Mental Health Needs to Find a Therapist and Psychiatrist. Patient will implement clinical interventions discussed today to decrease symptoms of depression and increase knowledge and/or ability of: coping skills. Mental Health Concerns and Social Isolation Recent job loss Recent inpatient BH stay Loss of stable housing due to family conflict  Patient lacks knowledge of available community counseling agencies and resources.  Clinical Goal(s): verbalize understanding of plan for management of Anger, Grief, Anxiety and Depression, and demonstrate a reduction in symptoms. Patient will connect with a provider for ongoing mental health treatment, increase coping skills, healthy habits, self-management skills, and stress reduction        Clinical Interventions:  Assessed patient's previous and current treatment, coping skills, support system and barriers to care. Patient provided hx. Recent inpatient BH stay at [facility].  Verbalization of feelings encouraged, motivational interviewing employed Emotional support provided, positive coping strategies explored. Establishing healthy boundaries emphasized and healthy self-care education provided Patient was educated on available mental health resources within  their area that accept Medicaid and offer counseling and psychiatry. Patient reports having Medicaid but this has not been confirmed yet.  Patient is agreeable to referral to [facility] for counseling and psychiatry. [facility] LCSW made referral on 01/28/23. Email sent to patient today with available mental health resources within her area that accept Medicaid and offer the services that she is interested in. Email included instructions for scheduling at [facility] as well as some crisis support resources and [facility]'s walk in clinic hours.  Emotional support provided. CBT intervention implemented regarding "being mentally fit" by combating negative thinking and replacing it with uplifting support, hope and positivity. Patient reports significant worsening anger impacting their ability to function appropriately and carry out daily task (recent job loss). Patient receives strong support from sister and niece. LCSW provided education on relaxation techniques such as meditation, deep breathing, massage, grounding exercises or yoga that can activate the body's relaxation response and ease symptoms of stress and anxiety. LCSW ask that when pt is struggling with difficult emotions and racing thoughts that they start this relaxation response process. LCSW provided extensive education on healthy coping skills for anxiety. SW used active and reflective listening, validated patient's feelings/concerns, and provided emotional support. Motivational Interviewing employed Depression screen reviewed  PHQ2/ PHQ9 completed or reviewed  Mindfulness or Relaxation training provided Active listening / Reflection utilized  Advance Care and HCPOA education provided Emotional Support Provided Problem Solving /Task Center strategies  reviewed Provided psychoeducation for mental health needs  Provided brief CBT  Reviewed mental health medications and discussed importance of compliance:  Quality of sleep assessed & Sleep Hygiene techniques  promoted  Participation in counseling encouraged  Verbalization of feelings encouraged  Suicidal Ideation/Homicidal Ideation assessed: Patient denies SI/HI  Review resources, discussed options and provided patient information about  Mental Health Resources Inter-disciplinary care team collaboration (see longitudinal plan of care) Update- [facility] LCSW reports recent suicidal thoughts due to the loss of her job even after applying for 52 different jobs. Patient immediately updated her PCP and he adjusted her medication--"please tell her to keep taking the Effexor daily, but we will add Lamotrigine 25 mg daily to it. This should help the Effexor work better. After 2 weeks we can increase the Lamotrigine to 50 mg daily if needed" Patient reports successfully starting this medication today. Patient has upcoming appointments on 02/24/23 and 02/25/23 at [facility]. Patient was advised to go back to [facility] if her thinking starts to get suicidal again. Patient was educated on the crisis support number 53 again today and she agreed to save it in her phone. Patient was encouraged to keep in contact with her church, friends and family members during this time. Update- Patient has been successfully established with a long term therapist with [facility]. She has had one session thus far but has several sessions already scheduled. Patient had to reschedule her initial psychiatry appointment because it was virtual and she had issues connecting. 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 Patient reports that she has been very open with her new supervisor and she feels that they will be able to effectively work with her based on her needs. Advised patient to exercise 10-20 minutes everyday, go outside in the sunlight everyday for  at least 10-15 minutes, drink 6-8 glasses of water a day, take vitamins/medications as directed and try to increase sleep.   Patient Goals/Self-Care Activities: Over the next 120 days Attend scheduled medical appointments Utilize healthy coping skills and supportive resources discussed Contact PCP with any questions or concerns Keep 90 percent of counseling appointments Call your insurance provider for more information about your Enhanced Benefits  Check out counseling resources provided  Begin personal counseling with LCSW, to reduce and manage symptoms of Depression and Stress, until well-established with mental health provider Accept all calls from representative with [facility] in an effort to establish ongoing mental health counseling and supportive services. Incorporate into daily practice - relaxation techniques, deep breathing exercises, and mindfulness meditation strategies. Talk about feelings with friends, family members, spiritual advisor, etc. Contact LCSW directly [phone]), if you have questions, need assistance, or if additional social work needs are identified between now and our next scheduled telephone outreach call. Call 988 for mental health hotline/crisis line if needed (24/7 available) Try techniques to reduce symptoms of anxiety/negative thinking (deep breathing, distraction, positive self talk, etc)  - develop a personal safety plan - develop a plan to deal with triggers like holidays, anniversaries - exercise at least 2 to 3 times per week - have a plan for how to handle bad days - journal feelings and what helps to feel better or worse - spend time or talk with others at least 2 to 3 times per week - watch for early signs of  feeling worse - begin personal counseling - call and visit an old friend - check out volunteer opportunities - join a support group - laugh; watch a funny movie or comedian - learn and use visualization or guided imagery - perform a random  act of kindness - practice relaxation or meditation daily - start or continue a personal journal - practice positive thinking and self-talk -continue with compliance of taking medication  -identify current effective and ineffective coping strategies.  -implement positive self-talk in care to increase self-esteem, confidence and feelings of control.  -consider alternative and complementary therapy approaches such as meditation, mindfulness or yoga.  Call your insurance provider to gain education on benefits if desired Call your primary care doctor if symptoms get worse  -journaling, prayer, worship services, meditation or pastoral counseling.  -increase participation in pleasurable group activities such as hobbies, singing, sports or volunteering).  -consider the use of meditative movement therapy such as tai chi, yoga or qigong.  -start a regular daily exercise program based on tolerance, ability and patient choice to support positive thinking and activity    If you are experiencing a Mental Health or Behavioral Health Crisis or need someone to talk to, please call the Suicide and Crisis Lifeline: 988    Patient Goals: Follow up goal          24- Hour Availability:    [facility]  [address] [place], [place] [phone] Crisis [phone]   [facility] [phone]  [place] [facility]  [phone]    [facility]  [phone] (after hours)   Therapeutic Alternative/Mobile Crisis   [phone]   [place] National Suicide Hotline  765-126-8477 Len Childs) [place] 073   Call 815-800-6802 for mental health emergencies   [facility]  [phone]);  [place] and [employer]  [phone]); [place], [place], [place], [place], [place], [place], [place]    [place] [facility] for [facility] Residents For 24/7 walk-up access to mental health services  for [facility] children (4+), adolescents and adults, please visit the [facility] located at [address] in [place], [place].  *[facility] also provides comprehensive outpatient behavioral health services in a variety of locations around the [place].  Connect With [place] [address] [place], [place] [address] HelpLine: [phone] or 1-516-171-4166  Get Directions  Find Help 24/7 By Phone Call our 24-hour HelpLine at (854)313-3166 or (402)474-0593 for immediate assistance for mental health and substance abuse issues.  Walk-In Help [place]: [facility] (Ages 4 and Up) [place]: Emergency Dept., [facility] Additional Resources National Hopeline Network: 1-800-SUICIDE The National Suicide Prevention Lifeline: 1-800-273-TALK     Follow up:  Patient agrees to Care Plan and Follow-up.  Plan: The Managed Medicaid care management team will reach out to the patient again over the next 30 days.  Dickie La, BSW, MSW, [employer] Managed Medicaid LCSW [facility]  [facility] [email or web address] Phone: [phone]

## 2023-03-04 ENCOUNTER — Ambulatory Visit (HOSPITAL_BASED_OUTPATIENT_CLINIC_OR_DEPARTMENT_OTHER): Payer: PRIVATE HEALTH INSURANCE | Admitting: Pulmonary Disease

## 2023-03-09 ENCOUNTER — Telehealth: Payer: Self-pay | Admitting: Licensed Clinical Social Worker

## 2023-03-09 NOTE — Patient Outreach (Signed)
Care Coordination  03/09/2023  Morgan [name] [birth year] [id number]  Patient sent email to [facility] LCSW reporting that [place] which has increased her stress and depression.   Resources sent to patient for her to consider and encouragement to continue therapy sessions provided.  [employer], [employer] -- [employer]  Employment agency in [place], [place] Address: [address] [place], [place] [address] Hours: Open ? Closes 4:30?PM Phone: [phone]  [employer]  Address: [address] [place], [place] [address] Phone: [phone]  Dickie La, BSW, MSW, LCSW Managed Medicaid LCSW [facility]  [facility] [email or web address] Phone: [phone]

## 2023-03-10 ENCOUNTER — Ambulatory Visit (HOSPITAL_COMMUNITY): Payer: BC Managed Care – PPO | Admitting: Licensed Clinical Social Worker

## 2023-03-17 ENCOUNTER — Encounter: Payer: Self-pay | Admitting: *Deleted

## 2023-03-17 NOTE — Progress Notes (Signed)
Pt attended 12/20/2022 event where her b/p was 120/83 and her blood sugar was 118. During the event and initial f/u, pt shared she had lost her job, had [employer] insurance that did not cover her visits to her current PCP and endocrinologist w/ whom she wanted to remains a pt. Health equity team member able to connect pt to [facility] DSS support team who helped pt get Medicaid and remain with her preferred providers. During the 60 day event f/u up today, chart review indicates pt has seen her PCP , Dr. Clent Ridges, on 01/26/23 and her endocrinologist, Dr. Lonzo Cloud on 02/06/23. Pt also f/u by Managed medicaid team member Dickie La, LCSW by phone on 01/28/23 and by [facility] on 02/24/23. Pt also has future appt with pulmonology on 03/23/23,and [facility] LCSW and MD on 04/07/23, and endocrinology on 08/10/23. No additional health equity team support indicated at this time.

## 2023-03-18 ENCOUNTER — Ambulatory Visit: Payer: PRIVATE HEALTH INSURANCE | Admitting: Primary Care

## 2023-03-23 ENCOUNTER — Encounter: Payer: Self-pay | Admitting: Primary Care

## 2023-03-23 ENCOUNTER — Ambulatory Visit (INDEPENDENT_AMBULATORY_CARE_PROVIDER_SITE_OTHER): Payer: Medicaid Other | Admitting: Primary Care

## 2023-03-23 VITALS — BP 104/62 | HR 79 | Temp 98.0°F | Ht 65.0 in | Wt 213.4 lb

## 2023-03-23 DIAGNOSIS — G4733 Obstructive sleep apnea (adult) (pediatric): Secondary | ICD-10-CM | POA: Diagnosis not present

## 2023-03-23 DIAGNOSIS — F419 Anxiety disorder, unspecified: Secondary | ICD-10-CM | POA: Diagnosis not present

## 2023-03-23 DIAGNOSIS — F32A Depression, unspecified: Secondary | ICD-10-CM

## 2023-03-23 NOTE — Patient Instructions (Addendum)
Great to see you today Morgan [name]  I am really proud of all the work you are doing on yourself and the changes you are making  If you have any thoughts of self harm please go to ED or call 988 crisis lifeline   Your sleep apnea is well controlled on current pressure settings, your machine is >5 years and due for replacement if covered by insurance. If not afforable ok to continue to use your current machine as it is working. Continue to wear CPAP nightly 4-6 hours or longer. No pressure changes needed   CPAP assistance program  [email or web address]  Orders: New CPAP machine 15cm h20   Follow-up: 6 months with Waynetta Sandy NP

## 2023-03-23 NOTE — Progress Notes (Signed)
@Patient  ID: [name], female    DOB: Nov 20, 1965, 57 y.o.   MRN: [id number]  Chief Complaint  Patient presents with   Follow-up    Yearly follow up CPAP compliance check.  Doing well.    Referring provider: Nelwyn Salisbury, MD  HPI: 57 year old female, someday smoker.  Past medical history significant for hypertension, OSA on CPAP, asthma, GERD, type 2 diabetes  03/23/2023 Patient presents today for a 1 year follow-up. During her last overview with Dr. Craige Cotta she was ordered for replacement CPAP machine as previous one was greater than 7 years old.  She could not afford the $50 co-pay at that time. Current machine is working. She is compliant with CPAP use nightly and reports benefit from therapy. Also during her last visit it was noted that she was having frequent falls. This has resolved. She relates this to workplace stress.  She ended up losing her job and is currently on Medicaid.  She has been dealing with thoughts of self-harm and recently checked herself into a [facility] health [facility]. Her primary care doctor started her on Xanax and she has been seeing a counselor regularly whom she likes.  Social work is following her case. She is stressed about paying her rent.  She recently started a part-time job.  She no longer is hearing voices or having thoughts of hurting herself or others.  She does not have access to any firearms. DME is [employer]. She has medicaid.   Airview download 02/18/2023 - 03/19/2023 Usage days 30/30 days (100%); 29 days (97%) greater than 4 hours Average usage 6 hours 32 minutes Pressure 15 cm H2O Air leaks 38.5 L/min (95%) AHI 1.2  Allergies  Allergen Reactions   Lisinopril Cough    Immunization History  Administered Date(s) Administered   Influenza Split 05/01/2011, 09/05/2013   Influenza,inj,Quad PF,6+ Mos 05/27/2017, 04/20/2018   Influenza-Unspecified 07/03/2016, 05/29/2021   Moderna Sars-Covid-2 Vaccination 09/22/2019, 10/20/2019, 01/19/2021    PPD Test 11/26/2022    Past Medical History:  Diagnosis Date   Anal fissure    saw Dr. Loretha Brasil    Anxiety    on meds   Arthritis    on meds   Asthma    uses inhaler if needed   Carpal tunnel syndrome    Depression    on meds   Diabetes mellitus    on meds   GERD (gastroesophageal reflux disease)    on meds   History of ovarian cancer in adulthood    Hyperlipidemia    on meds   Hypertension    on meds   Ovarian cancer (HCC) 1986   1996   Skin cancer (melanoma) (HCC) 1997   Sleep apnea    uses CPAP    Tubular adenoma of colon 01/2014    Tobacco History: Social History   Tobacco Use  Smoking Status Some Days   Types: Cigarettes  Smokeless Tobacco Never  Tobacco Comments   Patient states she smokes about 1-2 cigarettes a day some days.  Updated 03/23/2023. am   Ready to quit: Not Answered Counseling given: Not Answered Tobacco comments: Patient states she smokes about 1-2 cigarettes a day some days.  Updated 03/23/2023. am   Outpatient Medications Prior to Visit  Medication Sig Dispense Refill   albuterol (VENTOLIN HFA) 108 (90 Base) MCG/ACT inhaler Inhale 2 puffs into the lungs every 6 (six) hours as needed for wheezing or shortness of breath. 8 g 6   ALPRAZolam (XANAX) 1 MG tablet  Take 1 tablet (1 mg total) by mouth at bedtime as needed for sleep. 90 tablet 1   atorvastatin (LIPITOR) 80 MG tablet Take 1 tablet (80 mg total) by mouth daily. (Patient taking differently: Take 80 mg by mouth at bedtime.) 90 tablet 1   glimepiride (AMARYL) 1 MG tablet Take 1 tablet (1 mg total) by mouth daily with breakfast. 90 tablet 3   lamoTRIgine (LAMICTAL) 25 MG tablet Take 1 tablet (25 mg total) by mouth daily. 30 tablet 2   losartan-hydrochlorothiazide (HYZAAR) 50-12.5 MG tablet Take 1 tablet by mouth daily. (Patient taking differently: Take 1 tablet by mouth at bedtime.) 90 tablet 1   metFORMIN (GLUCOPHAGE-XR) 500 MG 24 hr tablet Take 1 tablet (500 mg total) by mouth  daily with breakfast. 90 tablet 3   metoprolol tartrate (LOPRESSOR) 50 MG tablet Take 1 tablet (50 mg total) by mouth 2 (two) times daily. 180 tablet 1   omeprazole (PRILOSEC) 40 MG capsule Take 1 capsule (40 mg total) by mouth 2 (two) times daily. 180 capsule 2   Semaglutide (RYBELSUS) 14 MG TABS Take 1 tablet (14 mg total) by mouth daily. 90 tablet 3   venlafaxine XR (EFFEXOR-XR) 150 MG 24 hr capsule Take 150 mg by mouth daily.     No facility-administered medications prior to visit.   Review of Systems  Review of Systems  Constitutional: Negative.   HENT: Negative.    Respiratory: Negative.      Physical Exam  BP 104/62 (BP Location: Left Arm, Patient Position: Sitting, Cuff Size: Normal)   Pulse 79   Temp 98 F (36.7 C) (Oral)   Ht 5\' 5"  (1.651 m)   Wt 213 lb 6.4 oz (96.8 kg)   LMP 03/22/1986   SpO2 97%   BMI 35.51 kg/m  Physical Exam Constitutional:      Appearance: Normal appearance.  HENT:     Head: Normocephalic and atraumatic.  Cardiovascular:     Rate and Rhythm: Normal rate and regular rhythm.  Pulmonary:     Effort: No respiratory distress.     Breath sounds: No wheezing or rhonchi.  Skin:    General: Skin is warm and dry.  Neurological:     General: No focal deficit present.     Mental Status: She is alert and oriented to person, place, and time. Mental status is at baseline.  Psychiatric:        Mood and Affect: Mood normal.        Behavior: Behavior normal.        Thought Content: Thought content normal.        Judgment: Judgment normal.      Lab Results:  CBC    Component Value Date/Time   WBC 10.5 12/29/2022 1935   RBC 4.53 12/29/2022 1935   HGB 13.3 12/29/2022 1935   HCT 40.5 12/29/2022 1935   PLT 293 12/29/2022 1935   MCV 89.4 12/29/2022 1935   MCH 29.4 12/29/2022 1935   MCHC 32.8 12/29/2022 1935   RDW 14.0 12/29/2022 1935   LYMPHSABS 1.9 12/29/2022 1935   MONOABS 0.8 12/29/2022 1935   EOSABS 0.2 12/29/2022 1935   BASOSABS 0.0  12/29/2022 1935    BMET    Component Value Date/Time   NA 138 12/29/2022 1935   K 3.5 12/29/2022 1935   CL 98 12/29/2022 1935   CO2 27 12/29/2022 1935   GLUCOSE 99 12/29/2022 1935   BUN 21 (H) 12/29/2022 1935   CREATININE 0.91 12/29/2022 1935  CREATININE 0.83 02/22/2020 1616   CALCIUM 9.6 12/29/2022 1935   GFRNONAA >60 12/29/2022 1935   GFRAA >60 10/17/2019 1135    BNP No results found for: "BNP"  ProBNP No results found for: "PROBNP"  Imaging: No results found.   Assessment & Plan:   OSA on CPAP - Hx severe sleep apnea, AHI >110. Maintained on Cpap  - She is 97% compliant with use >4 hours over the last 30 days  - Current pressure 15 cm h20; Residual AHI 1.2/hour - CPAP machine is >31 years old, she could not afford replacement last year. We will re-place order at current pressure settings  - Advised patient continue her CPAP nightly for 4 to 6 hours or longer and work on weight loss efforts    Anxiety and depression - Making progress. She is attending behavioral therapy/counseling and recent started a new job. Takes Lamictal, Effexor and Xanax as needed. Denies thoughts or SI. Support provided at length.    >40 mins spent on case; > 50% face to face with patient.   Glenford Bayley, NP 03/29/2023

## 2023-03-23 NOTE — Assessment & Plan Note (Addendum)
-   Hx severe sleep apnea, AHI >110. Maintained on Cpap  - She is 97% compliant with use >4 hours over the last 30 days  - Current pressure 15 cm h20; Residual AHI 1.2/hour - CPAP machine is >59 years old, she could not afford replacement last year. We will re-place order at current pressure settings  - Advised patient continue her CPAP nightly for 4 to 6 hours or longer and work on weight loss efforts

## 2023-03-24 ENCOUNTER — Ambulatory Visit (INDEPENDENT_AMBULATORY_CARE_PROVIDER_SITE_OTHER): Payer: Medicaid Other | Admitting: Licensed Clinical Social Worker

## 2023-03-24 ENCOUNTER — Telehealth: Payer: Self-pay | Admitting: *Deleted

## 2023-03-24 ENCOUNTER — Encounter (HOSPITAL_COMMUNITY): Payer: Self-pay

## 2023-03-24 DIAGNOSIS — F332 Major depressive disorder, recurrent severe without psychotic features: Secondary | ICD-10-CM | POA: Diagnosis not present

## 2023-03-24 DIAGNOSIS — F411 Generalized anxiety disorder: Secondary | ICD-10-CM | POA: Diagnosis not present

## 2023-03-24 NOTE — Progress Notes (Signed)
THERAPIST PROGRESS NOTE  Session Time: 2:00 PM to 2:47 PM  Participation Level: Active  Behavioral Response: CasualAlertAnxious, Dysphoric, and Euthymic  Type of Therapy: Individual Therapy  Treatment Goals addressed: Depression, anxiety, coping, self-esteem  ProgressTowards Goals: Initial-work with patient today on how comparisons are not helpful or accurate feedback about patient's new job how meaningful this will be helping her to feel better about herself  Interventions: Solution Focused, Strength-based, Supportive, and Other: coping  Summary: Morgan [name] is a 57 y.o. female who presents with patient has a best friend and she sees her having promotion and moving up in her life that things are good and patient says for her she is always been struggling and hard because she doesn't have what she has.  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 Friend said really good at helping the elderly a lot of experience helping people with dementia by helping her mom. Driving and crying since only working three days and wondered what she is going to do for other 4 days. How going to get money where going to go. Loves her boss and cares about her and called her and said she has those other days of work for her. Sat in store and cried when heard the news. Only three hours a day. Boss wants to arrange for her to work every other Sunday so can go back to church. Can get her 40 hours. Focus on what best friend is doing and best friend said has to stop doing that but patient says she has everything and she has nothing. Friend says be patient patient can get anxious and impatient it is the not knowing. Coworker from prior job says don't herself up and be so hard on herself patient said it is hard it didn't open my mouth would be there. Friend is trying to help her be good with changes what happened was meant to happen way to get out.  Patient still  says she was doing well there. Has health issues goes once a year to lung doctor for C-Pap because stopped breathing 135 times in a hour.  Miss Dad and stepmom used to talk to [facility] all the time. Thinks since  her and brother don't talk family is going down the toilet. Mom has dementia so can't have conversation with her miss her. Don't know why missed her only complained. Working and running her errands she would call and had to do everything. Didn't know how to deal with somebody with dementia early stages. Everything did for her think now helping these elderly people can do it because went through it with Mom. Can also provide support for the caretakers knows what they are going through. Hope this helps her cope with her own mom.  Patient does not want her friend to say best thing because going through the pain and the suffering. Friend says company going down patient still says if didn't open her mouth a job.   Patient shared comparing herself to her friend wants to know why therapist that probably because she has someone to things that she would like but has to except being on her own unique path can be hard but it is the truth were all unique cannot compare self with others in fact when we do we set her self up to feel less then, and yet these comparisons are not accurate or helpful they are not equivalence when we compared to each  other and have to understand the course of life going up and down for all of [place].  Know that inevitably life has struggles for people.  Therapist very enthusiastic about patient working with dementia patients patient has developed skill set also therapist feels this line of  work is very meaningful to help other people helps with her mental health feeling her life has meaning as well as helping other people.  Therapist wanted insight with patient struggle is a much human experience she is not alone.  Completed treatment plan she gave verbal consent in session to complete.   Therapist noted patient has some relief with a job positive this see although losing her job as she explains brought her a lot of struggle which she could have avoided therapist working with patient to help her see lots of purpose in this new direction  Suicidal/Homicidal: No  Plan: Return again in 1 week.2.  Work on depression and anxiety patient process thoughts and feelings in session  Diagnosis: Major depressive disorder, recurrent severe generalized anxiety disorder  Collaboration of Care: Other none needed  Patient/Guardian was advised Release of Information must be obtained prior to any record release in order to collaborate their care with an outside provider. Patient/Guardian was advised if they have not already done so to contact the registration department to sign all necessary forms in order for [place] to release information regarding their care.   Consent: Patient/Guardian gives verbal consent for treatment and assignment of benefits for services provided during this visit. Patient/Guardian expressed understanding and agreed to proceed.   Coolidge Breeze, LCSW 03/24/2023

## 2023-03-24 NOTE — Telephone Encounter (Signed)
Left patient a message to see if she is able to move out of overbook slot at 9:50 AM to 1:50 PM.

## 2023-03-25 ENCOUNTER — Other Ambulatory Visit: Payer: Medicaid Other | Admitting: Licensed Clinical Social Worker

## 2023-03-25 NOTE — Patient Instructions (Signed)
Visit Information  Ms. Ficklin was given information about Medicaid Managed Care team care coordination services as a part of their [facility] Community Plan Medicaid benefit. [name] verbally consented to engagement with the [facility] Managed Care team.   If you are experiencing a medical emergency, please call 911 or report to your local emergency department or urgent care.   If you have a non-emergency medical problem during routine business hours, please contact your provider's office and ask to speak with a nurse.   For questions related to your [facility], please call: [phone] or visit the homepage here: [email or web address]  If you would like to schedule transportation through your [facility], please call the following number at least 2 days in advance of your appointment: [phone]   Rides for urgent appointments can also be made after hours by calling Member Services.  Call the Behavioral Health Crisis Line at [phone], at any time, 24 hours a day, 7 days a week. If you are in danger or need immediate medical attention call 911.  If you would like help to quit smoking, call 1-800-QUIT-NOW (318-877-4354) OR Espaol: 1-855-Djelo-Ya (0-347-425-9563) o para ms informacin haga clic aqu or Text READY to 875-643 to register via text  Following is a copy of your plan of care:  Care Plan : LCSW Plan of Care  Updates made by Gustavus Bryant, LCSW since 03/25/2023 12:00 AM     Problem: Depression Identification (Depression)      Goal: Depressive Symptoms Identified   Start Date: 01/28/2023  Note:   Priority: High  Timeframe:  Short-Range Goal Priority:  High Start Date:   01/28/23           Expected End Date:  ongoing                     Goal completed on 03/25/23 as patient is now established with both counseling and psychiatry.   - keep 90  percent of scheduled appointments -consider counseling or psychiatry -consider bumping up your self-care  -consider creating a stronger support network   Why is this important?             Combatting stress may take some time.            If you don't feel better right away, don't give up on your treatment plan.    Current barriers:   Chronic Mental Health needs related to depression and anxiety. Patient requires Support, Education, Resources, Referrals, Advocacy, and Care Coordination, in order to meet Unmet Mental Health Needs to Find a Therapist and Psychiatrist. Patient will implement clinical interventions discussed today to decrease symptoms of depression and increase knowledge and/or ability of: coping skills. Mental Health Concerns and Social Isolation Recent job loss Recent inpatient BH stay Loss of stable housing due to family conflict  Patient lacks knowledge of available community counseling agencies and resources.  Clinical Goal(s): verbalize understanding of plan for management of Anger, Grief, Anxiety and Depression, and demonstrate a reduction in symptoms. Patient will connect with a provider for ongoing mental health treatment, increase coping skills, healthy habits, self-management skills, and stress reduction        Patient Goals/Self-Care Activities: Over the next 120 days Attend scheduled medical appointments Utilize healthy coping skills and supportive resources discussed Contact PCP with any questions or concerns Keep 90 percent of counseling appointments Call your insurance provider for more information about your Enhanced Benefits  Check out counseling resources provided  Begin personal counseling with LCSW, to reduce and manage symptoms of Depression and Stress, until well-established with mental health provider Accept all calls from representative with [facility] in an effort to establish ongoing mental health counseling and supportive services. Incorporate into  daily practice - relaxation techniques, deep breathing exercises, and mindfulness meditation strategies. Talk about feelings with friends, family members, spiritual advisor, etc. Contact LCSW directly [phone]), if you have questions, need assistance, or if additional social work needs are identified between now and our next scheduled telephone outreach call. Call 988 for mental health hotline/crisis line if needed (24/7 available) Try techniques to reduce symptoms of anxiety/negative thinking (deep breathing, distraction, positive self talk, etc)  - develop a personal safety plan - develop a plan to deal with triggers like holidays, anniversaries - exercise at least 2 to 3 times per week - have a plan for how to handle bad days - journal feelings and what helps to feel better or worse - spend time or talk with others at least 2 to 3 times per week - watch for early signs of feeling worse - begin personal counseling - call and visit an old friend - check out volunteer opportunities - join a support group - laugh; watch a funny movie or comedian - learn and use visualization or guided imagery - perform a random act of kindness - practice relaxation or meditation daily - start or continue a personal journal - practice positive thinking and self-talk -continue with compliance of taking medication  -identify current effective and ineffective coping strategies.  -implement positive self-talk in care to increase self-esteem, confidence and feelings of control.  -consider alternative and complementary therapy approaches such as meditation, mindfulness or yoga.  Call your insurance provider to gain education on benefits if desired Call your primary care doctor if symptoms get worse  -journaling, prayer, worship services, meditation or pastoral counseling.  -increase participation in pleasurable group activities such as hobbies, singing, sports or volunteering).  -consider the use of  meditative movement therapy such as tai chi, yoga or qigong.  -start a regular daily exercise program based on tolerance, ability and patient choice to support positive thinking and activity    If you are experiencing a Mental Health or Behavioral Health Crisis or need someone to talk to, please call the Suicide and Crisis Lifeline: 988    Patient Goals: Follow up goal      24- Hour Availability:    [facility]  [address] [place], [place] [phone] Crisis [phone]   [facility] [phone]  [place] [facility]  [phone]    [facility]  [phone] (after hours)   Therapeutic Alternative/Mobile Crisis   [phone]   [place] National Suicide Hotline  (808) 206-1942 Len Child[place]) [place] 761   Call [phone] for mental health emergencies   [facility]  [phone]);  [place] and [employer]  [phone]); [place], [place], [place], [place], [place], [place], [place]    [facility] Residents For 24/7 walk-up access to mental health services for [facility] children (4+), adolescents and adults, please visit the [facility] located at [address] in [place], [place].  *[facility] also provides comprehensive outpatient behavioral health services in a variety of locations around the [place].  Connect With [place] [address] [place], [place] [address] HelpLine: 725-683-6855 or 3804033151  Get Directions  Find Help 24/7 By Phone Call our 24-hour HelpLine at 307-282-2100 or 817-315-3290 for immediate assistance for mental health and substance abuse issues.  Walk-In Help [place]: [facility] (Ages 4 and Up) [place]: Emergency Dept., [facility] Additional Resources National Hopeline Network: 1-800-SUICIDE The  National Suicide Prevention Lifeline: 1-800-273-TALK     The following coping skill education was provided for stress relief and mental health management: "When your car dies or a deadline looms, how do you respond? Long-term, low-grade or acute stress takes a serious toll on your body and mind, so don't ignore feelings of constant tension. Stress is a natural part of life. However, too much stress can harm our health, especially if it continues every day. This is chronic stress and can put you at risk for heart problems like heart disease and depression. Understand what's happening inside your body and learn simple coping skills to combat the negative impacts of everyday stressors.  Types of Stress There are two types of stress: Emotional - types of emotional stress are relationship problems, pressure at work, financial worries, experiencing discrimination or having a major life change. Physical - Examples of physical stress include being sick having pain, not sleeping well, recovery from an injury or having an alcohol and drug use disorder. Fight or Flight Sudden or ongoing stress activates your nervous system and floods your bloodstream with adrenaline and cortisol, two hormones that raise blood pressure, increase heart rate and spike blood sugar. These changes pitch your body into a fight or flight response. That enabled our ancestors to outrun saber-toothed tigers, and it's helpful today for situations like dodging a car accident. But most modern chronic stressors, such as finances or a challenging relationship, keep your body in that heightened state, which hurts your health. Effects of Too Much Stress If constantly under stress, most of [place] will eventually start to function less well.  Multiple studies link chronic stress to a higher risk of heart disease, stroke, depression, weight gain, memory loss and even premature death, so it's important to recognize the warning signals. Talk to your doctor  about ways to manage stress if you're experiencing any of these symptoms: Prolonged periods of poor sleep. Regular, severe headaches. Unexplained weight loss or gain. Feelings of isolation, withdrawal or worthlessness. Constant anger and irritability. Loss of interest in activities. Constant worrying or obsessive thinking. Excessive alcohol or drug use. Inability to concentrate.  10 Ways to Cope with Chronic Stress It's key to recognize stressful situations as they occur because it allows you to focus on managing how you react. We all need to know when to close our eyes and take a deep breath when we feel tension rising. Use these tips to prevent or reduce chronic stress. 1. Rebalance Work and Home All work and no play? If you're spending too much time at the office, intentionally put more dates in your calendar to enjoy time for fun, either alone or with others. 2. Get Regular Exercise Moving your body on a regular basis balances the nervous system and increases blood circulation, helping to flush out stress hormones. Even a daily 20-minute walk makes a difference. Any kind of exercise can lower stress and improve your mood ? just pick activities that you enjoy and make it a regular habit. 3. Eat Well and Limit Alcohol and Stimulants Alcohol, nicotine and caffeine may temporarily relieve stress but have negative health impacts and can make stress worse in the long  run. Well-nourished bodies cope better, so start with a good breakfast, add more organic fruits and vegetables for a well-balanced diet, avoid processed foods and sugar, try herbal tea and drink more water. 4. Connect with Supportive People Talking face to face with another person releases hormones that reduce stress. Lean on those good listeners in your life. 5. Carve Out Hobby Time Do you enjoy gardening, reading, listening to music or some other creative pursuit? Engage in activities that bring you pleasure and joy; research  shows that reduces stress by almost half and lowers your heart rate, too. 6. Practice Meditation, Stress Reduction or Yoga Relaxation techniques activate a state of restfulness that counterbalances your body's fight-or-flight hormones. Even if this also means a 10-minute break in a long day: listen to music, read, go for a walk in nature, do a hobby, take a bath or spend time with a friend. Also consider doing a mindfulness exercise or try a daily deep breathing or imagery practice. Deep Breathing Slow, calm and deep breathing can help you relax. Try these steps to focus on your breathing and repeat as needed. Find a comfortable position and close your eyes. Exhale and drop your shoulders. Breathe in through your nose; fill your lungs and then your belly. Think of relaxing your body, quieting your mind and becoming calm and peaceful. Breathe out slowly through your nose, relaxing your belly. Think of releasing tension, pain, worries or distress. Repeat steps three and four until you feel relaxed. Imagery This involves using your mind to excite the senses -- sound, vision, smell, taste and feeling. This may help ease your stress. Begin by getting comfortable and then do some slow breathing. Imagine a place you love being at. It could be somewhere from your childhood, somewhere you vacationed or just a place in your imagination. Feel how it is to be in the place you're imagining. Pay attention to the sounds, air, colors, and who is there with you. This is a place where you feel cared for and loved. All is well. You are safe. Take in all the smells, sounds, tastes and feelings. As you do, feel your body being nourished and healed. Feel the calm that surrounds you. Breathe in all the good. Breathe out any discomfort or tension. 7. Sleep Enough If you get less than seven to eight hours of sleep, your body won't tolerate stress as well as it could. If stress keeps you up at night, address the cause, and  add extra meditation into your day to make up for the lost z's. Try to get seven to nine hours of sleep each night. Make a regular bedtime schedule. Keep your room dark and cool. Try to avoid computers, TV, cell phones and tablets before bed. 8. Bond with Connections You Enjoy Go out for a coffee with a friend, chat with a neighbor, call a family member, visit with a clergy member, or even hang out with your pet. Clinical studies show that spending even a short time with a companion animal can cut anxiety levels almost in half. 9. Take a Vacation Getting away from it all can reset your stress tolerance by increasing your mental and emotional outlook, which makes you a happier, more productive person upon return. Leave your cellphone and laptop at home! 10. See a Counselor, Coach or Therapist If negative thoughts overwhelm your ability to make positive changes, it's time to seek professional help. Make an appointment today--your health and life are worth it."  Dickie La,  BSW, MSW, [employer] Managed Medicaid LCSW [place]  [facility] [email or web address] Phone: [phone]

## 2023-03-25 NOTE — Patient Outreach (Signed)
Medicaid Managed Care Social Work Note  03/25/2023 Name:  Morgan [name] MRN:  [id number] DOB:  05-Aug-1965  Morgan [name] is an 57 y.o. year old female who is a primary patient of Clent Ridges Tera Mater, MD.  The Medicaid Managed Care Coordination team was consulted for assistance with:  Mental Health Counseling and Resources  Ms. Mcaloon was given information about Medicaid Managed Care Coordination team services today. [name] Patient agreed to services and verbal consent obtained.  Engaged with patient  for by telephone forfollow up visit in response to referral for case management and/or care coordination services.   Assessments/Interventions:  Review of past medical history, allergies, medications, health status, including review of consultants reports, laboratory and other test data, was performed as part of comprehensive evaluation and provision of chronic care management services.  SDOH: (Social Determinant of Health) assessments and interventions performed: SDOH Interventions    Flowsheet Row Patient Outreach Telephone from 03/25/2023 in [place] [facility] POP[facility]ATION HEALTH DEPARTMENT Counselor from 02/24/2023 in [facility] Patient Outreach Telephone from 02/11/2023 in [place] POPULATION HEALTH DEPARTMENT Patient Outreach Telephone from 01/28/2023 in  POPULATION HEALTH DEPARTMENT Community Documentation from 12/20/2022 in [facility]  SDOH Interventions       Food Insecurity Interventions -- -- -- -- Other (Comment)  [Food resources given]  Housing Interventions -- -- -- -- Other (Comment)  [Housing resources given]  Transportation Interventions -- -- -- -- Intervention Not Indicated  Depression Interventions/Treatment  -- Counseling, Medication -- Referral to Psychiatry  [Resources provided] --  Stress Interventions Offered [employer], Provide Counseling  [Stress  has decreased due to new job] -- [employer], Provide Counseling -- --       Advanced Directives Status:  See Care Plan for related entries.  Care Plan                 Allergies  Allergen Reactions   Lisinopril Cough    Medications Reviewed Today   Medications were not reviewed in this encounter     Patient Active Problem List   Diagnosis Date Noted   Genetic testing 01/02/2023   MDD (major depressive disorder), recurrent episode, severe (HCC) 12/29/2022   MDD (major depressive disorder), severe (HCC) 12/29/2022   History of ovarian cancer in adulthood 12/15/2022   Campylobacter diarrhea 09/24/2022   Hypokalemia 09/22/2022   Actinic skin damage 09/22/2022   Acute gastroenteritis 09/22/2022   Carpal tunnel syndrome, left upper limb 08/26/2021   Carpal tunnel syndrome, right upper limb 08/26/2021   Carpal tunnel syndrome on both sides 08/02/2021   Asthma 01/09/2021   Vitamin B12 deficiency 09/05/2020   Dyslipidemia 09/04/2020   Neuropathy 09/04/2020   Mixed dyslipidemia 09/04/2020   COVID-19 virus infection 06/19/2020   Type 2 diabetes mellitus with diabetic polyneuropathy, without long-term current use of insulin (HCC) 08/25/2019   Type 2 diabetes mellitus with hyperglycemia, without long-term current use of insulin (HCC) 02/17/2019   Anxiety and depression 02/17/2019   BMI 45.0-49.9, adult (HCC) 02/04/2019   Ringing in ears, bilateral 12/29/2017   OSA on CPAP 01/06/2017   Witnessed episode of apnea 07/03/2016   Diverticulitis of colon 11/30/2014   IBS (irritable bowel syndrome) 04/21/2013   Chronic cough 11/26/2011   External hemorrhoids 04/04/2010   UNSPECIFIED THROMBOSED HEMORRHOIDS 01/08/2009   GERD 08/23/2008   Diabetes mellitus without complication (HCC) 01/22/2007   Hyperlipidemia 01/22/2007   Essential hypertension 01/22/2007  Conditions to be addressed/monitored per PCP order:  Depression  Care Plan : LCSW Plan of Care   Updates made by Gustavus Bryant, LCSW since 03/25/2023 12:00 AM     Problem: Depression Identification (Depression)      Goal: Depressive Symptoms Identified   Start Date: 01/28/2023  Note:   Priority: High  Timeframe:  Short-Range Goal Priority:  High Start Date:   01/28/23           Expected End Date:  ongoing                     Goal completed on 03/25/23 as patient is now established with both counseling and psychiatry.   - keep 90 percent of scheduled appointments -consider counseling or psychiatry -consider bumping up your self-care  -consider creating a stronger support network   Why is this important?             Combatting stress may take some time.            If you don't feel better right away, don't give up on your treatment plan.    Current barriers:   Chronic Mental Health needs related to depression and anxiety. Patient requires Support, Education, Resources, Referrals, Advocacy, and Care Coordination, in order to meet Unmet Mental Health Needs to Find a Therapist and Psychiatrist. Patient will implement clinical interventions discussed today to decrease symptoms of depression and increase knowledge and/or ability of: coping skills. Mental Health Concerns and Social Isolation Recent job loss Recent inpatient BH stay Loss of stable housing due to family conflict  Patient lacks knowledge of available community counseling agencies and resources.  Clinical Goal(s): verbalize understanding of plan for management of Anger, Grief, Anxiety and Depression, and demonstrate a reduction in symptoms. Patient will connect with a provider for ongoing mental health treatment, increase coping skills, healthy habits, self-management skills, and stress reduction        Clinical Interventions:  Assessed patient's previous and current treatment, coping skills, support system and barriers to care. Patient provided hx. Recent inpatient BH stay at [facility].  Verbalization of feelings  encouraged, motivational interviewing employed Emotional support provided, positive coping strategies explored. Establishing healthy boundaries emphasized and healthy self-care education provided Patient was educated on available mental health resources within their area that accept Medicaid and offer counseling and psychiatry. Patient reports having Medicaid but this has not been confirmed yet.  Patient is agreeable to referral to [facility] for counseling and psychiatry. [facility] LCSW made referral on 01/28/23. Email sent to patient today with available mental health resources within her area that accept Medicaid and offer the services that she is interested in. Email included instructions for scheduling at [facility] as well as some crisis support resources and [facility]'s walk in clinic hours.  Emotional support provided. CBT intervention implemented regarding "being mentally fit" by combating negative thinking and replacing it with uplifting support, hope and positivity. Patient reports significant worsening anger impacting their ability to function appropriately and carry out daily task (recent job loss). Patient receives strong support from sister and niece. LCSW provided education on relaxation techniques such as meditation, deep breathing, massage, grounding exercises or yoga that can activate the body's relaxation response and ease symptoms of stress and anxiety. LCSW ask that when pt is struggling with difficult emotions and racing thoughts that they start this relaxation response process. LCSW provided extensive education on healthy coping skills for anxiety. SW used active and reflective listening, validated patient's feelings/concerns, and  provided emotional support. Motivational Interviewing employed Depression screen reviewed  PHQ2/ PHQ9 completed or reviewed  Mindfulness or Relaxation training provided Active listening / Reflection utilized  Advance Care and HCPOA education provided Emotional Support  Provided Problem Solving /Task Center strategies reviewed Provided psychoeducation for mental health needs  Provided brief CBT  Reviewed mental health medications and discussed importance of compliance:  Quality of sleep assessed & Sleep Hygiene techniques promoted  Participation in counseling encouraged  Verbalization of feelings encouraged  Suicidal Ideation/Homicidal Ideation assessed: Patient denies SI/HI  Review resources, discussed options and provided patient information about  Mental Health Resources Inter-disciplinary care team collaboration (see longitudinal plan of care) Update- [facility] LCSW reports recent suicidal thoughts due to the loss of her job even after applying for 52 different jobs. Patient immediately updated her PCP and he adjusted her medication--"please tell her to keep taking the Effexor daily, but we will add Lamotrigine 25 mg daily to it. This should help the Effexor work better. After 2 weeks we can increase the Lamotrigine to 50 mg daily if needed" Patient reports successfully starting this medication today. Patient has upcoming appointments on 02/24/23 and 02/25/23 at [facility]. Patient was advised to go back to [facility] if her thinking starts to get suicidal again. Patient was educated on the crisis support number 71 again today and she agreed to save it in her phone. Patient was encouraged to keep in contact with her church, friends and family members during this time. Update- Patient has been successfully established with a long term therapist with [facility]. She has had one session thus far but has several sessions already scheduled. Patient had to reschedule her initial psychiatry appointment because it was virtual and she had issues connecting. 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 Patient reports that she has been very open with her new supervisor and she feels that they will be able to effectively work with her based on her needs. Advised patient to exercise 10-20 minutes everyday, go outside in the sunlight everyday for at least 10-15 minutes, drink 6-8 glasses of water a day, take vitamins/medications as directed and try to increase sleep.  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 Patient is now working almost 40 hrs per week. She attends weekly therapy at [facility]. Patient has an upcoming psychiatry appointment with [facility] on 04/07/23. Patient denies being any current crisis at this time. She is agreeable to case closure at this time because her mental health needs have been successfully met. Patient is in the process of getting a CPAP machine after completing a recent sleep study. [facility] LCSW provided brief self-care education and stress management coping skill education. Patient was appreciative of support provided.   Patient Goals/Self-Care Activities: Over the next 120 days Attend scheduled medical appointments Utilize healthy coping skills and supportive resources discussed Contact PCP with any questions or concerns Keep 90 percent of counseling appointments Call your insurance provider for more information about your Enhanced Benefits  Check out counseling resources provided  Begin personal counseling with LCSW, to reduce and manage symptoms of Depression and Stress, until well-established with mental health provider Accept all calls from representative with [facility] in an effort to establish ongoing mental health counseling and supportive services. Incorporate into  daily practice - relaxation techniques, deep breathing exercises, and mindfulness meditation strategies. Talk about feelings with friends, family members, spiritual advisor, etc. Contact LCSW directly [phone]), if you have questions, need  assistance, or if additional social work needs are identified between now and our next scheduled telephone outreach call. Call 988 for mental health hotline/crisis line if needed (24/7 available) Try techniques to reduce symptoms of anxiety/negative thinking (deep breathing, distraction, positive self talk, etc)  - develop a personal safety plan - develop a plan to deal with triggers like holidays, anniversaries - exercise at least 2 to 3 times per week - have a plan for how to handle bad days - journal feelings and what helps to feel better or worse - spend time or talk with others at least 2 to 3 times per week - watch for early signs of feeling worse - begin personal counseling - call and visit an old friend - check out volunteer opportunities - join a support group - laugh; watch a funny movie or comedian - learn and use visualization or guided imagery - perform a random act of kindness - practice relaxation or meditation daily - start or continue a personal journal - practice positive thinking and self-talk -continue with compliance of taking medication  -identify current effective and ineffective coping strategies.  -implement positive self-talk in care to increase self-esteem, confidence and feelings of control.  -consider alternative and complementary therapy approaches such as meditation, mindfulness or yoga.  Call your insurance provider to gain education on benefits if desired Call your primary care doctor if symptoms get worse  -journaling, prayer, worship services, meditation or pastoral counseling.  -increase participation in pleasurable group activities such as hobbies, singing, sports or volunteering).  -consider the use of meditative movement therapy such as tai chi, yoga or qigong.  -start a regular daily exercise program based on tolerance, ability and patient choice to support positive thinking and activity    If you are experiencing a Mental Health or Behavioral  Health Crisis or need someone to talk to, please call the Suicide and Crisis Lifeline: 988    Patient Goals: Follow up goal          24- Hour Availability:    [facility]  [address] [place], [place] [phone] Crisis [phone]   [facility] [phone]  [place] [facility]  [phone]    [facility]  [phone] (after hours)   Therapeutic Alternative/Mobile Crisis   [phone]   [place] National Suicide Hotline  7800256145 Len Childs) [place] [address]   Call 616 193 1876 for mental health emergencies   [facility]  [phone]);  [place] and [employer]  [phone]); [place], [place], [place], [place], [place], [place], [place]    [place] [facility] for [facility] Residents For 24/7 walk-up access to mental health services for [facility] children (4+), adolescents and adults, please visit the [facility] located at [address] in [place], [place].  *[place] also provides comprehensive outpatient behavioral health services in a variety of locations around the [place].  Connect With [place] [address] [place], [place] [address] HelpLine: 708-859-4424 or 1-629-792-9785  Get Directions  Find Help 24/7 By Phone Call our 24-hour HelpLine at 989-313-4092 or (607) 238-9982 for immediate assistance for mental health and substance abuse issues.  Walk-In Help [place]: [place] [facility] (Ages 4 and Up) [facility]: Emergency  Dept., [facility] Additional Resources National Hopeline Network: 1-800-SUICIDE The National Suicide Prevention Lifeline: 1-800-273-TALK     Follow up:  Patient requests no follow-up at this time.  Plan: The Managed Medicaid care management team is available to follow up with the patient after provider conversation with  the patient regarding recommendation for care management engagement and subsequent re-referral to the care management team.   Dickie La, BSW, MSW, LCSW Managed Medicaid LCSW [facility]  [facility] [email or web address] Phone: [phone]

## 2023-03-26 ENCOUNTER — Ambulatory Visit: Payer: Medicaid Other | Admitting: Obstetrics and Gynecology

## 2023-03-26 ENCOUNTER — Encounter: Payer: Self-pay | Admitting: Obstetrics and Gynecology

## 2023-03-26 ENCOUNTER — Other Ambulatory Visit (HOSPITAL_COMMUNITY)
Admission: RE | Admit: 2023-03-26 | Discharge: 2023-03-26 | Disposition: A | Discharge status: Home / Self Care | Payer: Medicaid Other | Source: Ambulatory Visit | Attending: Obstetrics and Gynecology | Admitting: Obstetrics and Gynecology

## 2023-03-26 VITALS — BP 111/74 | HR 80 | Temp 98.2°F | Ht 65.0 in | Wt 211.0 lb

## 2023-03-26 DIAGNOSIS — L292 Pruritus vulvae: Secondary | ICD-10-CM | POA: Diagnosis not present

## 2023-03-26 DIAGNOSIS — N898 Other specified noninflammatory disorders of vagina: Secondary | ICD-10-CM | POA: Diagnosis present

## 2023-03-26 DIAGNOSIS — B3789 Other sites of candidiasis: Secondary | ICD-10-CM

## 2023-03-26 MED ORDER — CLOTRIMAZOLE-BETAMETHASONE 1-0.05 % EX CREA: TOPICAL_CREAM | Freq: Two times a day (BID) | CUTANEOUS | 0 refills | Status: AC

## 2023-03-26 NOTE — Patient Instructions (Signed)
-   Avoid anything with harsh detergents, perfumes or dyes. This includes laundry detergent, soap, and dryer sheets for your bottoms/underwear - Avoid scrubbing INSIDE the vagina - Wear cotton underwear when you wear underwear and loose fitting bottoms. When you are at home, skip underwear  If you try all of these things and you're still itching, try using the steroid/antifungal cream I sent at night for 2 weeks.   Trying the vaginal estrogen can help with pain with intercourse

## 2023-03-26 NOTE — Progress Notes (Signed)
   RETURN GYNECOLOGY VISIT  Subjective:  Morgan [name] is a 57 y.o. 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 Notes that she has been hot/sweating more with the weather. Will scrub the area with dial soap.  Uses gain detergent and scented dryer sheets. No discharge.   Has pain with intercourse. Has not tried vaginal estrogen. Did not like the uberlube.   Also has rash underneath breasts. Using OTC steroid cream with some improvement.   Objective:   Vitals:   03/26/23 1331  BP: 111/74  Pulse: 80  Temp: 98.2 F (36.8 C)  Weight: 211 lb (95.7 kg)  Height: 5\' 5"  (1.651 m)   General:  Alert, oriented and cooperative. Patient is in no acute distress.  Skin: Skin is warm and dry. No rash noted.   Cardiovascular: Normal heart rate noted  Respiratory: Normal respiratory effort, no problems with respiration noted  Abdomen: Soft, non-tender, non-distended   Pelvic: NEFG. No erythema, lesions or excoriations. Small skin tag on R vulva. No discharge or odor.   Exam performed in the presence of a chaperone  Assessment and Plan:  Morgan [name] is a 57 y.o. with vulvar itching  1. Vulvar itching Discussed vulvar care (see AVS for recommendations) Reviewed that pt can try steroid ointment if itching persists despite lifestyle modification - Cervicovaginal ancillary only( [place])  2. Candidiasis of breast -     clotrimazole-betamethasone (LOTRISONE) cream; Apply topically 2 (two) times daily for 14 days.  3. Genitourinary syndrome of menopause Not ready to try vaginal estrogen. Discussed that it is her choice and she can try if/when she is ready   Return if symptoms worsen or fail to improve.  Future Appointments  Date Time Provider Department Center  03/31/2023  9:00 AM Mollie Germany, LCSW [facility] None  04/07/2023  9:00 AM Thresa Ross, MD [facility] None   04/14/2023 10:00 AM Mollie Germany, LCSW [facility] None  04/21/2023 10:00 AM Mollie Germany, LCSW [facility] None  08/10/2023  2:20 PM Shamleffer, Konrad Dolores, MD [facility] None  09/21/2023  4:00 PM Glenford Bayley, NP [facility] None   Lennart Pall, MD

## 2023-03-26 NOTE — Progress Notes (Signed)
Pt has rash under breasts- is using OTC cream

## 2023-03-27 LAB — CERVICOVAGINAL ANCILLARY ONLY
Candida Glabrata: NEGATIVE
Candida Vaginitis: POSITIVE — AB
Chlamydia: NEGATIVE
Comment: NEGATIVE
Comment: NEGATIVE
Comment: NEGATIVE
Comment: NEGATIVE
Comment: NORMAL
Neisseria Gonorrhea: NEGATIVE
Trichomonas: NEGATIVE

## 2023-03-28 ENCOUNTER — Encounter: Payer: Self-pay | Admitting: Obstetrics and Gynecology

## 2023-03-29 NOTE — Assessment & Plan Note (Addendum)
-   Making progress. She is attending behavioral therapy/counseling and recent started a new job. Takes Lamictal, Effexor and Xanax as needed. Denies thoughts or SI. Support provided at length.

## 2023-03-30 ENCOUNTER — Encounter: Payer: Self-pay | Admitting: Family Medicine

## 2023-03-30 DIAGNOSIS — G4733 Obstructive sleep apnea (adult) (pediatric): Secondary | ICD-10-CM | POA: Diagnosis not present

## 2023-03-30 MED ORDER — FLUCONAZOLE 150 MG PO TABS: 150.0000 mg | ORAL_TABLET | ORAL | 0 refills | Status: DC

## 2023-03-30 NOTE — Progress Notes (Signed)
Reviewed and agree with assessment/plan.   Coralyn Helling, MD [facility] Pulmonary/Critical Care 03/30/2023, 8:21 AM Pager:  [phone]

## 2023-03-30 NOTE — Addendum Note (Signed)
Addended by: Harvie Bridge on: 03/30/2023 09:19 AM   Modules accepted: Orders

## 2023-03-31 ENCOUNTER — Ambulatory Visit (INDEPENDENT_AMBULATORY_CARE_PROVIDER_SITE_OTHER): Payer: Medicaid Other | Admitting: Licensed Clinical Social Worker

## 2023-03-31 DIAGNOSIS — F411 Generalized anxiety disorder: Secondary | ICD-10-CM

## 2023-03-31 DIAGNOSIS — F332 Major depressive disorder, recurrent severe without psychotic features: Secondary | ICD-10-CM | POA: Diagnosis not present

## 2023-03-31 NOTE — Progress Notes (Signed)
THERAPIST PROGRESS NOTE  Session Time: 9:00 AM to 9:45 AM  Participation Level: Active  Behavioral Response: CasualAlertDepressed  Type of Therapy: Individual Therapy  Treatment Goals addressed: Depression, anxiety, coping, self-esteem  ProgressTowards Goals: Progressing-worked on emotional regulation strategies utilized CBT for depression to help patient begin to develop skills to decrease depression  Interventions: CBT, Solution Focused, Strength-based, Supportive, Anger Management Training, and Other: emotional regulation  Summary: Morgan [name] is a 57 y.o. 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  Would not leave her boss. Friday and Saturday do a different lady 10:00 AM to 6 PM likes her a lot. Gets annoyed when asks if married and kids. Patient says praying about it. Want that a companion to love her and be with her. Yesterday not a good day. Just laid there started feeling the depression the same crap no money in account can't do anything mind going can't sleep wake up at 2 AM. Job is fine. Should I go back to behavioral health.  Therapist utilized reframing to note expectation of just getting started will take time to get things established. Reviewed emotional regulation strategies after patient provided incident getting frustrated with a family member says she learned calm down and think about it say something to soften her anger,  Looked at sheet on depression looking at how thoughts and behaviors impact how we feel patient relates but she says does not solve source of problem that she is depressed therapist encouraging her to begin using skills thinking to help shift her mood. Problems there still no money still have to pay rent. Talked about current job people are getting help and appreciate her help which is rewarding just not  enough money. 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 Didn't do what what did wouldn't be in this position.  Therapist noted she thinks this would have happened because patient does not learn how to manage her anger appropriately and would have happened anyway.  Reviewed session patient said calm down before say something. Sit back deep breath and not as bad as think. Think about what say before say it. Positive activity therapist encouraged.  Looking at depression worksheet explored with patient activity patient said was encouraged to walk around the neighborhood therapist explained reasons that would be good also she is going to look into water aerobics  Work with patient on emotional regulation skills and the need to slow down reviewed a process where she identifies the emotions provide some common phrases to help from calming down when calm reframe some calming strategies can include do not accuse, what is the best thing that would work right now.  Noted she does not want to act from motion but more from calm brain.  Therapist looked at another strategy called STOPP which follows the same guideline by guiding patient to take of breath to take a pause observe what is going on get perspective practice what works.  Began to look at causes of depression with CBT worksheet noting the thoughts and what we do affects how we feel.  Having patient's start to look at cognitive distortions involved with depression also usually we start first with some  action steps and explain why that is helpful helps naturally antidepressants, helps you feel good about yourself helps focus on things other than depression.  Good for health.  Patient shared some examples of engagement with work where she got angry therapist guided patient to get perspective how angry reaction  is not helpful think about just solving the problem when we get angry it usually makes it worse.  Patient discouraged about current situation therapist reframed to note she is getting started will take time to make more progress noted some of the positives in her situation work is not as hard and more meaningful.  Needs to start practicing skills that we will help with making progress.  Suicidal/Homicidal: No  Plan: Return again in 2 weeks.2.  Continue with depression worksheet, self-esteem worksheet.3.  Patient work on Building surveyor and engaging in a positive activity. 4.  Look at it [employer] which is a program guiding her with CBT skills  Diagnosis: Major depressive disorder, recurrent severe generalized anxiety disorder  Collaboration of Care: Other none needed  Patient/Guardian was advised Release of Information must be obtained prior to any record release in order to collaborate their care with an outside provider. Patient/Guardian was advised if they have not already done so to contact the registration department to sign all necessary forms in order for [place] to release information regarding their care.   Consent: Patient/Guardian gives verbal consent for treatment and assignment of benefits for services provided during this visit. Patient/Guardian expressed understanding and agreed to proceed.   Coolidge Breeze, LCSW 03/31/2023

## 2023-04-02 ENCOUNTER — Encounter: Payer: Self-pay | Admitting: Family Medicine

## 2023-04-02 ENCOUNTER — Encounter (HOSPITAL_COMMUNITY): Payer: Self-pay | Admitting: Licensed Clinical Social Worker

## 2023-04-03 DIAGNOSIS — E785 Hyperlipidemia, unspecified: Secondary | ICD-10-CM

## 2023-04-03 MED ORDER — ATORVASTATIN CALCIUM 80 MG PO TABS
80.0000 mg | ORAL_TABLET | Freq: Every day | ORAL | 1 refills | Status: DC
Start: 2023-04-03 — End: 2023-10-12

## 2023-04-03 NOTE — Telephone Encounter (Signed)
Rx sent 

## 2023-04-06 ENCOUNTER — Encounter: Payer: Self-pay | Admitting: Family Medicine

## 2023-04-06 NOTE — Telephone Encounter (Signed)
I am sorry to hear that. Hopefully she can find a new therapist fairly quickly

## 2023-04-07 ENCOUNTER — Ambulatory Visit (HOSPITAL_COMMUNITY): Payer: Medicaid Other | Admitting: Psychiatry

## 2023-04-07 ENCOUNTER — Ambulatory Visit (HOSPITAL_COMMUNITY): Payer: Medicaid Other | Admitting: Licensed Clinical Social Worker

## 2023-04-08 ENCOUNTER — Telehealth: Payer: Self-pay | Admitting: Licensed Clinical Social Worker

## 2023-04-08 NOTE — Patient Outreach (Signed)
Care Coordination  04/08/2023  Morgan [name] 06/27/1966 [id number]  Patient reports that she is no longer able to meet with her previous therapist and needs assisting with a finding a new one due to insurance coverage. [facility] LCSW sent email to patient with the following instructions: I have you down for my first available appointment on 10/28 at 1 pm. Hope that works. Until then look at this list for resources, and I'll make a new referral for you that day on the 28th if needed or you can call Dr. Clent Ridges and he can possibly assist with the referral faster if you know where you want to go based off this list. Or you can simply call them directly (which agency of your choosing) and ask about a referral.   Therapy and Counseling Resources  Most providers on this list will take Medicaid. Patients with commercial insurance or Medicare should contact their insurance company to get a list of in network providers.     **Emergency Room for Mental Health Crises-(The have inpatient AND outpatient services)  [facility]- [address], [place], [place] [address]. Call [phone] or [phone] or come in as a walk-in patient.  CALL [phone] and press 6 then 2 to schedule your appointments. OR Call [phone] and ask if you can schedule your appointments.     Accepts Medicaid ([place] or Managed) and Uninsured (Self-Pay) patients. WALK IN HOURS New Patient Assessment/Therapy Walk-ins Monday -Thursday 8am until slots are full. Every 1st 2nd, 3rd, and 4th Friday 1pm-4pm (First Come, First Served) New Patient Psychiatry/Medication Management Monday-Friday 8am-11am (First Come, First Served) For all walk-ins we ask that you arrive by 7:15am, because patients will be seen in the order of arrival. Availability is limited, and therefore you may not be seen on the same day that you walk in. Our goal is to serve and meet the needs of our community to the  best of our ability.   Appointments are conducted in-person and/or tele Internal referrals last for 3 months only--    [facility] (takes adult and children)  Location 1: [address]  [place], [place] [address]  Services provided: Merchant navy officer. Offers virtual counseling. However, Medicaid patients have to apply for a grant to gain services.  Phone: [phone]        [facility] (spanish speaking therapist available)(habla espanol)(take medicare and medicaid)  [address] [place], [place], [place] [address], [place]  [email or web address]  [phone]     [facility]iatry [facility] Counseling  [address] [place], [place] [address]  [phone]     [facility]   [address] [place], [place] [address]      [phone]     [facility] (spanish available)  [address]  [place], [place] [address]  [phone]     [facility] Psychiatry, Counseling and Tele Health  Address: [address], [place], [place] [address] Phone: [phone]  [facility]  Psychologist (only counseling)  Address: [address], [place], [place] [address]  Phone: [phone]    (take medicaid and [address])      [facility]  Psychiatrist in [place], [place]. No counseling available yet  Address: [address] [place] [address], [place], [place] [address]  Open ? Closes 6?PM  Phone: [phone]     [facility]  Counseling and Psychiatry  [address]  [place], [place] [address]  Phone: [phone]  Fax: [phone]     [facility] at [address] [place], [place] [address] [phone]  Accepts Medicaid and Private Insurance (Unable to accept uninsured/self pay) Provides outpatient therapy for behavioral health and substance use  conditions    [facility] (virtual only)  [phone]     [facility]  [address], [place], [place] [phone]     [facility]:  [address] [place] [phone]     [facility]:  [address] [place], [place] [phone]     [facility]  [address]  [place]    [phone]     [facility]  [facility] -[address]     Phone:  [phone]     [facility]-  [address]  [phone]     [facility]  [address]      [place], [place] [phone] ext [address]-  [address], [place], [place] [address]  [phone]     [facility]: [address] [place], [place], [place]  [phone]     [facility] (Spanish therapist) [email or web address]  [address] [place]  [address]   [place] [address]    [phone]      [facility]   [address],  [place], [place]  [phone]     [facility]  [address] [place]  [phone]     [facility]  [address] [place], [place]        [phone]     [facility] (Psychiatry & counseling; adults & children; will take Medicaid)  [address],  [place], [place]  [phone]      [facility]- Two locations in [place]. [address] [place] [address], [phone] and [facility] [address] [place] [address] [id number], [phone]. Their main number is [phone].  Accept Aetna, BCBS, [place], [place], [place],  and the following types of Medicaid; Alliance, [place], Partners, [place], [place] Health Choice, [employer], Healthy [place], [place] Complete, AmeriHealth, and [place], as well as offering a Careers adviser and private payment options. Provides  In-Office Appointments, Virtual Appointments, and Phone Consultations Offers medication management for ages 33 years old and up    Open Access/Walk In Clinic under & uninsured     [facility]  [address] [place], [place] [phone]  Crisis [phone]     [facility] [place], (Spanish)   [address], [place]: [phone]) 8:30 - 12; 1 - 2:30     [facility] [employer],  [address], [place]    [phone]):8:30 - 12; 2 - 3PM     [facility], [address]  [place]; [phone]):   Mon - Fri 8 AM - 5 PM  Dickie La, BSW, MSW, [employer] Managed Medicaid LCSW [facility]  [facility] [email or web address] Phone: [phone]

## 2023-04-09 DIAGNOSIS — G4733 Obstructive sleep apnea (adult) (pediatric): Secondary | ICD-10-CM | POA: Diagnosis not present

## 2023-04-09 NOTE — Telephone Encounter (Signed)
She is calling now. Please reply to [facility] visit. Thanks.

## 2023-04-10 ENCOUNTER — Telehealth: Payer: Self-pay

## 2023-04-10 NOTE — Telephone Encounter (Signed)
Spoke with patient regarding prior message   Dr Ames Dura. I went to pick up new c pack machine Thursday 04/08/23 They need you to redo the prescription order to indicate A NEW RESMED C PACK machine.   They were going to give me a different brand because it was not indicated on your request.   So can you redo the request and state it to say a new.  RESMED  c pack machine.     Please advise me once you sent the order.   LET ME KNOW IF ANY THING ELSE YOU NEED.     Thank you so much Morgan [name]  Tel [phone]     Patient stated that the company made a mistake and they two [name] . Patient was able to get her New CPAP machine. Nothing else further needed.

## 2023-04-13 NOTE — Telephone Encounter (Signed)
Morgan [name] spoke with the patient on 04/10/23 see response below Patient stated that the company made a mistake and they two [name]'s . Patient was able to get her New CPAP machine. Nothing else further needed.

## 2023-04-14 ENCOUNTER — Ambulatory Visit (HOSPITAL_COMMUNITY): Payer: Medicaid Other | Admitting: Licensed Clinical Social Worker

## 2023-04-20 ENCOUNTER — Encounter: Payer: Self-pay | Admitting: Family Medicine

## 2023-04-21 ENCOUNTER — Ambulatory Visit (HOSPITAL_COMMUNITY): Payer: Medicaid Other | Admitting: Licensed Clinical Social Worker

## 2023-04-23 MED ORDER — LOSARTAN POTASSIUM-HCTZ 50-12.5 MG PO TABS: 1.0000 | ORAL_TABLET | Freq: Every day | ORAL | 1 refills | Status: DC

## 2023-04-27 ENCOUNTER — Other Ambulatory Visit: Payer: Self-pay | Admitting: Licensed Clinical Social Worker

## 2023-04-27 NOTE — Patient Outreach (Signed)
Medicaid Managed Care Social Work Note  04/27/2023 Name:  Morgan [name] MRN:  [id number] DOB:  [birth year]  Morgan [name] is an 57 y.o. year old female who is a primary patient of Morgan Ridges Tera Mater, MD.  The Medicaid Managed Care Coordination team was consulted for assistance with:  Mental Health Counseling and Resources  Morgan [name] was given information about Medicaid Managed Care Coordination team services today. Morgan [name] Patient agreed to services and verbal consent obtained.  Engaged with patient  for by telephone for final discharge visit in response to referral for case management and/or care coordination services.   Assessments/Interventions:  Review of past medical history, allergies, medications, health status, including review of consultants reports, laboratory and other test data, was performed as part of comprehensive evaluation and provision of chronic care management services.  SDOH: (Social Determinant of Health) assessments and interventions performed: SDOH Interventions    Flowsheet Row Patient Outreach Telephone from 04/27/2023 in [place] POPULATION HEALTH DEPARTMENT Patient Outreach Telephone from 03/25/2023 in [place] POPULATION HEALTH DEPARTMENT Counselor from 02/24/2023 in [facility] Patient Outreach Telephone from 02/11/2023 in [place] POPULATION HEALTH DEPARTMENT Patient Outreach Telephone from 01/28/2023 in [place] POPULATION HEALTH DEPARTMENT Community Documentation from 12/20/2022 in [facility]  SDOH Interventions        Food Insecurity Interventions -- -- -- -- -- Other (Comment)  [Food resources given]  Housing Interventions -- -- -- -- -- Other (Comment)  [Housing resources given]  Transportation Interventions -- -- -- -- -- Intervention Not Indicated  Depression Interventions/Treatment  -- -- Counseling, Medication -- Referral to Psychiatry   [Resources provided] --  Stress Interventions --  [Pt reports that she discontinued therapy with her last counselor and found another provider within the [employer] system] [employer], Provide Counseling  [Stress has decreased due to new job] -- [employer], Provide Counseling -- --       Advanced Directives Status:  See Care Plan for related entries.  Care Plan                 Allergies  Allergen Reactions   Lisinopril Cough    Medications Reviewed Today   Medications were not reviewed in this encounter     Patient Active Problem List   Diagnosis Date Noted   Genetic testing 01/02/2023   MDD (major depressive disorder), recurrent episode, severe (HCC) 12/29/2022   MDD (major depressive disorder), severe (HCC) 12/29/2022   History of ovarian cancer in adulthood 12/15/2022   Campylobacter diarrhea 09/24/2022   Hypokalemia 09/22/2022   Actinic skin damage 09/22/2022   Acute gastroenteritis 09/22/2022   Carpal tunnel syndrome, left upper limb 08/26/2021   Carpal tunnel syndrome, right upper limb 08/26/2021   Carpal tunnel syndrome on both sides 08/02/2021   Asthma 01/09/2021   Vitamin B12 deficiency 09/05/2020   Dyslipidemia 09/04/2020   Neuropathy 09/04/2020   Mixed dyslipidemia 09/04/2020   COVID-19 virus infection 06/19/2020   Type 2 diabetes mellitus with diabetic polyneuropathy, without long-term current use of insulin (HCC) 08/25/2019   Type 2 diabetes mellitus with hyperglycemia, without long-term current use of insulin (HCC) 02/17/2019   Anxiety and depression 02/17/2019   BMI 45.0-49.9, adult (HCC) 02/04/2019   Ringing in ears, bilateral 12/29/2017   OSA on CPAP 01/06/2017   Witnessed episode of apnea 07/03/2016   Diverticulitis of colon 11/30/2014   IBS (irritable bowel syndrome) 04/21/2013   Chronic  cough 11/26/2011   External hemorrhoids 04/04/2010   UNSPECIFIED THROMBOSED HEMORRHOIDS 01/08/2009   GERD 08/23/2008    Diabetes mellitus without complication (HCC) 01/22/2007   Hyperlipidemia 01/22/2007   Essential hypertension 01/22/2007    Conditions to be addressed/monitored per PCP order:  Depression  Care Plan : LCSW Plan of Care  Updates made by Gustavus Bryant, LCSW since 04/27/2023 12:00 AM     Problem: Depression Identification (Depression)      Goal: Depressive Symptoms Identified   Start Date: 01/28/2023  Note:   Priority: High  Timeframe:  Short-Range Goal Priority:  High Start Date:   01/28/23           Expected End Date:  04/27/23, behavioral health resource education provided again today.                Goal completed on 03/25/23 as patient is now established with both counseling and psychiatry. Update- Patient reports discontinuing services at [facility] per her own decision. Patient was in need of connection to a new behavioral health provider but reports that has now been secured as she has a new initial appointment set for 05/12/23 at 9 am with Morgan [name], [facility] [place] which is the previous practice she as using for behavioral health treatment but her session is with a different provider.    - keep 90 percent of scheduled appointments -consider counseling or psychiatry -consider bumping up your self-care  -consider creating a stronger support network   Why is this important?             Combatting stress may take some time.            If you don't feel better right away, don't give up on your treatment plan.    Current barriers:   Chronic Mental Health needs related to depression and anxiety. Patient requires Support, Education, Resources, Referrals, Advocacy, and Care Coordination, in order to meet Unmet Mental Health Needs to Find a Therapist and Psychiatrist. Patient will implement clinical interventions discussed today to decrease symptoms of depression and increase knowledge and/or ability  of: coping skills. Mental Health Concerns and Social Isolation Recent job loss Recent inpatient BH stay Loss of stable housing due to family conflict  Patient lacks knowledge of available community counseling agencies and resources.  Clinical Goal(s): verbalize understanding of plan for management of Anger, Grief, Anxiety and Depression, and demonstrate a reduction in symptoms. Patient will connect with a provider for ongoing mental health treatment, increase coping skills, healthy habits, self-management skills, and stress reduction        Clinical Interventions:  Assessed patient's previous and current treatment, coping skills, support system and barriers to care. Patient provided hx. Recent inpatient BH stay at [facility].  Verbalization of feelings encouraged, motivational interviewing employed Emotional support provided, positive coping strategies explored. Establishing healthy boundaries emphasized and healthy self-care education provided Patient was educated on available mental health resources within their area that accept Medicaid and offer counseling and psychiatry. Patient reports having Medicaid but this has not been confirmed yet.  Patient is agreeable to referral to [facility] for counseling and psychiatry. [facility] LCSW made referral on 01/28/23. Email sent to patient today with available mental health resources within her area that accept Medicaid and offer the services that she is interested in. Email included instructions for scheduling at [facility] as well as some crisis support resources and [facility]'s walk in clinic hours.  Emotional support  provided. CBT intervention implemented regarding "being mentally fit" by combating negative thinking and replacing it with uplifting support, hope and positivity. Patient reports significant worsening anger impacting their ability to function appropriately and carry out daily task (recent job loss). Patient receives strong support from sister and niece. LCSW  provided education on relaxation techniques such as meditation, deep breathing, massage, grounding exercises or yoga that can activate the body's relaxation response and ease symptoms of stress and anxiety. LCSW ask that when pt is struggling with difficult emotions and racing thoughts that they start this relaxation response process. LCSW provided extensive education on healthy coping skills for anxiety. SW used active and reflective listening, validated patient's feelings/concerns, and provided emotional support. Motivational Interviewing employed Depression screen reviewed  PHQ2/ PHQ9 completed or reviewed  Mindfulness or Relaxation training provided Active listening / Reflection utilized  Advance Care and HCPOA education provided Emotional Support Provided Problem Solving /Task Center strategies reviewed Provided psychoeducation for mental health needs  Provided brief CBT  Reviewed mental health medications and discussed importance of compliance:  Quality of sleep assessed & Sleep Hygiene techniques promoted  Participation in counseling encouraged  Verbalization of feelings encouraged  Suicidal Ideation/Homicidal Ideation assessed: Patient denies SI/HI  Review resources, discussed options and provided patient information about  Mental Health Resources Inter-disciplinary care team collaboration (see longitudinal plan of care) Update- [facility] LCSW reports recent suicidal thoughts due to the loss of her job even after applying for 52 different jobs. Patient immediately updated her PCP and he adjusted her medication--"please tell her to keep taking the Effexor daily, but we will add Lamotrigine 25 mg daily to it. This should help the Effexor work better. After 2 weeks we can increase the Lamotrigine to 50 mg daily if needed" Patient reports successfully starting this medication today. Patient has upcoming appointments on 02/24/23 and 02/25/23 at [facility]. Patient was advised to go back to [facility] if her thinking starts to get suicidal again. Patient was educated on the crisis support number 44 again today and she agreed to save it in her phone. Patient was encouraged to keep in contact with her church, friends and family members during this time. Update- Patient has been successfully established with a long term therapist with [facility]. She has had one session thus far but has several sessions already scheduled. Patient had to reschedule her initial psychiatry appointment because it was virtual and she had issues connecting. Patient will be [place] a new dog [place] but she is worried about the amount of walking she will have to do. Patient reports that she has been very open with her new supervisor and she feels that they will be able to effectively work with her based on her needs. Advised patient to exercise 10-20 minutes everyday, go outside in the sunlight everyday for at least 10-15 minutes, drink 6-8 glasses of water a day, take vitamins/medications as directed and try to increase sleep.  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 Patient is now working almost 40 hrs per week. She attends weekly therapy at [facility]. Patient has an upcoming psychiatry appointment with [facility] on 04/07/23. Patient denies being any current crisis at this time. She is agreeable to case closure at this time because her mental health needs have been successfully met. Patient is in the process of getting a CPAP machine  after completing a recent sleep study. [facility] LCSW provided brief self-care education and stress management coping skill education. Patient was appreciative of support provided.  Goal completed on 03/25/23 as patient is now established with both counseling and psychiatry. Update- Patient reports discontinuing services at [facility] per her own decision. Patient was in need of connection to a new behavioral health provider but reports that has now been secured as she has a new initial appointment set for 05/12/23 at 9 am with Morgan [name], [facility] [place] which is the previous practice she as using for behavioral health treatment but her session is with a different provider.  Patient is having a hard time managing her anger and getting along with others (including her family, providers and coworkers.) Patient was provided empathetic listening throughout session. Anger and anxiety coping skill education provided to patient as well. Patient has had a hard time finding a therapist and psychiatrist that she feels connected and comfortable with. Patient denied needing another email sent with other behavioral health options for her to choose from in case she needs to pursue them. [facility] LCSW will sign off at this time.   Patient Goals/Self-Care Activities: Over the next 120 days Attend scheduled medical appointments Utilize healthy coping skills and supportive resources discussed Contact PCP with any questions or concerns Keep 90 percent of counseling appointments Call your insurance provider for more information about your Enhanced Benefits  Check out counseling resources provided  Begin personal counseling with LCSW, to reduce and manage symptoms of Depression and Stress, until well-established with mental health provider Accept all calls from representative with [facility] in an effort to establish ongoing mental health counseling and supportive services. Incorporate into daily practice - relaxation techniques, deep breathing exercises, and mindfulness meditation strategies. Talk about feelings with friends, family members, spiritual advisor, etc. Contact LCSW directly [phone]), if you have questions, need assistance, or if additional social work needs are  identified between now and our next scheduled telephone outreach call. Call 988 for mental health hotline/crisis line if needed (24/7 available) Try techniques to reduce symptoms of anxiety/negative thinking (deep breathing, distraction, positive self talk, etc)  - develop a personal safety plan - develop a plan to deal with triggers like holidays, anniversaries - exercise at least 2 to 3 times per week - have a plan for how to handle bad days - journal feelings and what helps to feel better or worse - spend time or talk with others at least 2 to 3 times per week - watch for early signs of feeling worse - begin personal counseling - call and visit an old friend - check out volunteer opportunities - join a support group - laugh; watch a funny movie or comedian - learn and use visualization or guided imagery - perform a random act of kindness - practice relaxation or meditation daily - start or continue a personal journal - practice positive thinking and self-talk -continue with compliance of taking medication  -identify current effective and ineffective coping strategies.  -implement positive self-talk in care to increase self-esteem, confidence and feelings of control.  -consider alternative and complementary therapy approaches such as meditation, mindfulness or yoga.  Call your insurance provider to gain education on benefits if desired Call your primary care doctor if symptoms get worse  -journaling, prayer, worship services, meditation or pastoral counseling.  -increase participation in pleasurable group activities such as hobbies, singing, sports or volunteering).  -consider the use  of meditative movement therapy such as tai chi, yoga or qigong.  -start a regular daily exercise program based on tolerance, ability and patient choice to support positive thinking and activity   GOAL COMPLETED as of 04/27/23.  If you are experiencing a Mental Health or Behavioral Health Crisis or need  someone to talk to, please call the Suicide and Crisis Lifeline: 988    Patient Goals: Discharge goal        Follow up:  Patient requests no follow-up at this time.  Plan: The Managed Medicaid care management team is available to follow up with the patient after provider conversation with the patient regarding recommendation for care management engagement and subsequent re-referral to the care management team.   Dickie La, BSW, MSW, LCSW Managed Medicaid LCSW [facility]  [facility] [email or web address] Phone: [phone]

## 2023-04-27 NOTE — Patient Instructions (Signed)
Visit Information  Morgan [name] was given information about Medicaid Managed Care team care coordination services as a part of their [facility] Community Plan Medicaid benefit. Morgan [name] verbally consented to engagement with the [facility] Managed Care team.   If you are experiencing a medical emergency, please call 911 or report to your local emergency department or urgent care.   If you have a non-emergency medical problem during routine business hours, please contact your provider's office and ask to speak with a nurse.   For questions related to your [facility], please call: [phone] or visit the homepage here: [email or web address]  If you would like to schedule transportation through your [facility], please call the following number at least 2 days in advance of your appointment: [phone]   Rides for urgent appointments can also be made after hours by calling Member Services.  Call the Behavioral Health Crisis Line at [phone], at any time, 24 hours a day, 7 days a week. If you are in danger or need immediate medical attention call 911.  If you would like help to quit smoking, call 1-800-QUIT-NOW ((425)148-0070) OR Espaol: 1-855-Djelo-Ya (2-536-644-0347) o para ms informacin haga clic aqu or Text READY to 425-956 to register via text  Following is a copy of your plan of care:  Care Plan : LCSW Plan of Care  Updates made by Gustavus Bryant, LCSW since 04/27/2023 12:00 AM     Problem: Depression Identification (Depression)      Goal: Depressive Symptoms Identified   Start Date: 01/28/2023  Note:   Priority: High  Timeframe:  Short-Range Goal Priority:  High Start Date:   01/28/23           Expected End Date:  04/27/23, behavioral health resource education provided again today.                Goal completed on 03/25/23 as patient is now  established with both counseling and psychiatry. Update- Patient reports discontinuing services at [facility] per her own decision. Patient was in need of connection to a new behavioral health provider but reports that has now been secured as she has a new initial appointment set for 05/12/23 at 9 am with Serafina Mitchell, [facility] [place] which is the previous practice she as using for behavioral health treatment but her session is with a different provider.    - keep 90 percent of scheduled appointments -consider counseling or psychiatry -consider bumping up your self-care  -consider creating a stronger support network   Why is this important?             Combatting stress may take some time.            If you don't feel better right away, don't give up on your treatment plan.    Current barriers:   Chronic Mental Health needs related to depression and anxiety. Patient requires Support, Education, Resources, Referrals, Advocacy, and Care Coordination, in order to meet Unmet Mental Health Needs to Find a Therapist and Psychiatrist. Patient will implement clinical interventions discussed today to decrease symptoms of depression and increase knowledge and/or ability of: coping skills. Mental Health Concerns and Social Isolation Recent job loss Recent inpatient BH stay Loss of stable housing due to family conflict  Patient lacks knowledge of available community counseling agencies and resources.  Clinical Goal(s): verbalize understanding of plan for management of Anger, Grief, Anxiety  and Depression, and demonstrate a reduction in symptoms. Patient will connect with a provider for ongoing mental health treatment, increase coping skills, healthy habits, self-management skills, and stress reduction        Patient Goals/Self-Care Activities: Over the next 120 days Attend scheduled medical appointments Utilize healthy coping  skills and supportive resources discussed Contact PCP with any questions or concerns Keep 90 percent of counseling appointments Call your insurance provider for more information about your Enhanced Benefits  Check out counseling resources provided  Begin personal counseling with LCSW, to reduce and manage symptoms of Depression and Stress, until well-established with mental health provider Accept all calls from representative with [facility] in an effort to establish ongoing mental health counseling and supportive services. Incorporate into daily practice - relaxation techniques, deep breathing exercises, and mindfulness meditation strategies. Talk about feelings with friends, family members, spiritual advisor, etc. Contact LCSW directly [phone]), if you have questions, need assistance, or if additional social work needs are identified between now and our next scheduled telephone outreach call. Call 988 for mental health hotline/crisis line if needed (24/7 available) Try techniques to reduce symptoms of anxiety/negative thinking (deep breathing, distraction, positive self talk, etc)  - develop a personal safety plan - develop a plan to deal with triggers like holidays, anniversaries - exercise at least 2 to 3 times per week - have a plan for how to handle bad days - journal feelings and what helps to feel better or worse - spend time or talk with others at least 2 to 3 times per week - watch for early signs of feeling worse - begin personal counseling - call and visit an old friend - check out volunteer opportunities - join a support group - laugh; watch a funny movie or comedian - learn and use visualization or guided imagery - perform a random act of kindness - practice relaxation or meditation daily - start or continue a personal journal - practice positive thinking and self-talk -continue with compliance of taking medication  -identify current effective and ineffective coping  strategies.  -implement positive self-talk in care to increase self-esteem, confidence and feelings of control.  -consider alternative and complementary therapy approaches such as meditation, mindfulness or yoga.  Call your insurance provider to gain education on benefits if desired Call your primary care doctor if symptoms get worse  -journaling, prayer, worship services, meditation or pastoral counseling.  -increase participation in pleasurable group activities such as hobbies, singing, sports or volunteering).  -consider the use of meditative movement therapy such as tai chi, yoga or qigong.  -start a regular daily exercise program based on tolerance, ability and patient choice to support positive thinking and activity   GOAL COMPLETED as of 04/27/23.  If you are experiencing a Mental Health or Behavioral Health Crisis or need someone to talk to, please call the Suicide and Crisis Lifeline: 988    Patient Goals: Discharge goal   The following coping skill education was provided for stress relief and mental health management: "When your car dies or a deadline looms, how do you respond? Long-term, low-grade or acute stress takes a serious toll on your body and mind, so don't ignore feelings of constant tension. Stress is a natural part of life. However, too much stress can harm our health, especially if it continues every day. This is chronic stress and can put you at risk for heart problems like heart disease and depression. Understand what's happening inside your body and learn simple coping skills to  combat the negative impacts of everyday stressors.  Types of Stress There are two types of stress: Emotional - types of emotional stress are relationship problems, pressure at work, financial worries, experiencing discrimination or having a major life change. Physical - Examples of physical stress include being sick having pain, not sleeping well, recovery from an injury or having an alcohol and  drug use disorder. Fight or Flight Sudden or ongoing stress activates your nervous system and floods your bloodstream with adrenaline and cortisol, two hormones that raise blood pressure, increase heart rate and spike blood sugar. These changes pitch your body into a fight or flight response. That enabled our ancestors to outrun saber-toothed tigers, and it's helpful today for situations like dodging a car accident. But most modern chronic stressors, such as finances or a challenging relationship, keep your body in that heightened state, which hurts your health. Effects of Too Much Stress If constantly under stress, most of [place] will eventually start to function less well.  Multiple studies link chronic stress to a higher risk of heart disease, stroke, depression, weight gain, memory loss and even premature death, so it's important to recognize the warning signals. Talk to your doctor about ways to manage stress if you're experiencing any of these symptoms: Prolonged periods of poor sleep. Regular, severe headaches. Unexplained weight loss or gain. Feelings of isolation, withdrawal or worthlessness. Constant anger and irritability. Loss of interest in activities. Constant worrying or obsessive thinking. Excessive alcohol or drug use. Inability to concentrate.  10 Ways to Cope with Chronic Stress It's key to recognize stressful situations as they occur because it allows you to focus on managing how you react. We all need to know when to close our eyes and take a deep breath when we feel tension rising. Use these tips to prevent or reduce chronic stress. 1. Rebalance Work and Home All work and no play? If you're spending too much time at the office, intentionally put more dates in your calendar to enjoy time for fun, either alone or with others. 2. Get Regular Exercise Moving your body on a regular basis balances the nervous system and increases blood circulation, helping to flush out stress  hormones. Even a daily 20-minute walk makes a difference. Any kind of exercise can lower stress and improve your mood ? just pick activities that you enjoy and make it a regular habit. 3. Eat Well and Limit Alcohol and Stimulants Alcohol, nicotine and caffeine may temporarily relieve stress but have negative health impacts and can make stress worse in the long run. Well-nourished bodies cope better, so start with a good breakfast, add more organic fruits and vegetables for a well-balanced diet, avoid processed foods and sugar, try herbal tea and drink more water. 4. Connect with Supportive People Talking face to face with another person releases hormones that reduce stress. Lean on those good listeners in your life. 5. Carve Out Hobby Time Do you enjoy gardening, reading, listening to music or some other creative pursuit? Engage in activities that bring you pleasure and joy; research shows that reduces stress by almost half and lowers your heart rate, too. 6. Practice Meditation, Stress Reduction or Yoga Relaxation techniques activate a state of restfulness that counterbalances your body's fight-or-flight hormones. Even if this also means a 10-minute break in a long day: listen to music, read, go for a walk in nature, do a hobby, take a bath or spend time with a friend. Also consider doing a mindfulness exercise or try a  daily deep breathing or imagery practice. Deep Breathing Slow, calm and deep breathing can help you relax. Try these steps to focus on your breathing and repeat as needed. Find a comfortable position and close your eyes. Exhale and drop your shoulders. Breathe in through your nose; fill your lungs and then your belly. Think of relaxing your body, quieting your mind and becoming calm and peaceful. Breathe out slowly through your nose, relaxing your belly. Think of releasing tension, pain, worries or distress. Repeat steps three and four until you feel relaxed. Imagery This involves  using your mind to excite the senses -- sound, vision, smell, taste and feeling. This may help ease your stress. Begin by getting comfortable and then do some slow breathing. Imagine a place you love being at. It could be somewhere from your childhood, somewhere you vacationed or just a place in your imagination. Feel how it is to be in the place you're imagining. Pay attention to the sounds, air, colors, and who is there with you. This is a place where you feel cared for and loved. All is well. You are safe. Take in all the smells, sounds, tastes and feelings. As you do, feel your body being nourished and healed. Feel the calm that surrounds you. Breathe in all the good. Breathe out any discomfort or tension. 7. Sleep Enough If you get less than seven to eight hours of sleep, your body won't tolerate stress as well as it could. If stress keeps you up at night, address the cause, and add extra meditation into your day to make up for the lost z's. Try to get seven to nine hours of sleep each night. Make a regular bedtime schedule. Keep your room dark and cool. Try to avoid computers, TV, cell phones and tablets before bed. 8. Bond with Connections You Enjoy Go out for a coffee with a friend, chat with a neighbor, call a family member, visit with a clergy member, or even hang out with your pet. Clinical studies show that spending even a short time with a companion animal can cut anxiety levels almost in half. 9. Take a Vacation Getting away from it all can reset your stress tolerance by increasing your mental and emotional outlook, which makes you a happier, more productive person upon return. Leave your cellphone and laptop at home! 10. See a Counselor, Coach or Therapist If negative thoughts overwhelm your ability to make positive changes, it's time to seek professional help. Make an appointment today--your health and life are worth it."     24- Hour Availability:    [facility]  [address] [place], [place] [phone] Crisis [phone]   [facility] [phone]  [place] [facility]  [phone]    [facility]  [phone] (after hours)   Therapeutic Alternative/Mobile Crisis   [phone]   [place] National Suicide Hotline  5648111514 Len Childs) [place] 884   Call [phone] for mental health emergencies   [facility]  [phone]);  [place] and [employer]  [phone]); [place], [place], [place], [place], [place], [place], [place]    [place] [facility] Residents For 24/7 walk-up access to mental health services for [facility] children (4+), adolescents and adults, please visit the [facility] located at [address] in [place], [place].  *[facility] also provides comprehensive outpatient behavioral health services in a variety of locations  around the [place].  Connect With [place] [address] [place], [place] [address] HelpLine: 470-540-1147 or 1-([phone])[phone]  Get Directions  Find Help 24/7 By Phone Call our 24-hour HelpLine at 312-879-7036 or (904)308-1337 for immediate assistance for mental health and substance abuse issues.  Walk-In Help [place]: [facility] (Ages 4 and Up) [place]: Emergency Dept., [facility] Additional Resources National Hopeline Network: 1-800-SUICIDE The National Suicide Prevention Lifeline: 1-800-273-TALK     Dickie La, BSW, MSW, LCSW Managed Medicaid LCSW [facility]  [facility] [email or web address] Phone: [phone]

## 2023-04-30 ENCOUNTER — Encounter: Payer: Self-pay | Admitting: Family Medicine

## 2023-05-01 NOTE — Telephone Encounter (Signed)
Spoke with pt advised to schedule OV with Dr Clent Ridges for this problem

## 2023-05-04 ENCOUNTER — Encounter: Payer: Self-pay | Admitting: Family Medicine

## 2023-05-04 ENCOUNTER — Ambulatory Visit (INDEPENDENT_AMBULATORY_CARE_PROVIDER_SITE_OTHER): Payer: Medicaid Other | Admitting: Family Medicine

## 2023-05-04 VITALS — BP 98/68 | HR 68 | Temp 97.8°F | Wt 219.0 lb

## 2023-05-04 DIAGNOSIS — F32A Depression, unspecified: Secondary | ICD-10-CM

## 2023-05-04 DIAGNOSIS — G4700 Insomnia, unspecified: Secondary | ICD-10-CM | POA: Insufficient documentation

## 2023-05-04 DIAGNOSIS — Z23 Encounter for immunization: Secondary | ICD-10-CM

## 2023-05-04 DIAGNOSIS — F419 Anxiety disorder, unspecified: Secondary | ICD-10-CM

## 2023-05-04 MED ORDER — ALPRAZOLAM 1 MG PO TABS: 1.0000 mg | ORAL_TABLET | Freq: Every evening | ORAL | 1 refills | Status: DC | PRN

## 2023-05-04 MED ORDER — LAMOTRIGINE 25 MG PO TABS: 25.0000 mg | ORAL_TABLET | Freq: Every day | ORAL | 1 refills | Status: DC

## 2023-05-04 MED ORDER — VENLAFAXINE HCL ER 150 MG PO CP24: 150.0000 mg | ORAL_CAPSULE | Freq: Every day | ORAL | 1 refills | Status: DC

## 2023-05-04 NOTE — Addendum Note (Signed)
Addended by: Carola Rhine on: 05/04/2023 11:48 AM   Modules accepted: Orders

## 2023-05-04 NOTE — Progress Notes (Signed)
   Subjective:    Patient ID: [name], female    DOB: 08/13/1965, 57 y.o.   MRN: [id number]  HPI Here to follow up on insomnia and anxiety with depression. A few months ago we started her on Lamictal 25 mg, and we intended for her to take this every day. She actually has only taken this a few days a week. She says her anxiety and depression are fairly stable, but she has trouble falling and staying asleep. She has tried taking 2 mg of Xanax at bedtime, and when she does she sleeps all night. However she feels groggy the next morning.    Review of Systems  Constitutional: Negative.   Respiratory: Negative.    Cardiovascular: Negative.   Psychiatric/Behavioral:  Positive for agitation, dysphoric mood and sleep disturbance. Negative for confusion, decreased concentration, hallucinations, self-injury and suicidal ideas. The patient is nervous/anxious.        Objective:   Physical Exam Constitutional:      Appearance: Normal appearance.  Cardiovascular:     Rate and Rhythm: Normal rate and regular rhythm.     Pulses: Normal pulses.     Heart sounds: Normal heart sounds.  Pulmonary:     Effort: Pulmonary effort is normal.     Breath sounds: Normal breath sounds.  Neurological:     General: No focal deficit present.     Mental Status: She is alert and oriented to person, place, and time.  Psychiatric:        Mood and Affect: Mood normal.        Behavior: Behavior normal.        Thought Content: Thought content normal.           Assessment & Plan:  Her anxiety and depression is stable now. She will follow up with her therapist. For sleep, she will take only 1 mg of Xanax at bedtime. However I otld her the Lamictal will not work properly unless she takes it every day. I advised her to take this every night at bedtime along with the Xanax. She will report back in  2 weeks . Gershon Crane, MD

## 2023-05-08 ENCOUNTER — Encounter: Payer: Self-pay | Admitting: Internal Medicine

## 2023-05-10 DIAGNOSIS — G4733 Obstructive sleep apnea (adult) (pediatric): Secondary | ICD-10-CM | POA: Diagnosis not present

## 2023-05-12 ENCOUNTER — Telehealth: Payer: Self-pay

## 2023-05-12 ENCOUNTER — Encounter: Payer: Self-pay | Admitting: Internal Medicine

## 2023-05-12 ENCOUNTER — Encounter: Payer: Self-pay | Admitting: Behavioral Health

## 2023-05-12 ENCOUNTER — Encounter: Payer: Self-pay | Admitting: Family Medicine

## 2023-05-12 ENCOUNTER — Ambulatory Visit: Payer: Medicaid Other | Admitting: Behavioral Health

## 2023-05-12 ENCOUNTER — Other Ambulatory Visit: Payer: Self-pay | Admitting: Internal Medicine

## 2023-05-12 DIAGNOSIS — R454 Irritability and anger: Secondary | ICD-10-CM | POA: Diagnosis not present

## 2023-05-12 DIAGNOSIS — F331 Major depressive disorder, recurrent, moderate: Secondary | ICD-10-CM | POA: Diagnosis not present

## 2023-05-12 DIAGNOSIS — F411 Generalized anxiety disorder: Secondary | ICD-10-CM | POA: Diagnosis not present

## 2023-05-12 NOTE — Progress Notes (Signed)
                Yurika Pereda M Shanzay Hepworth, LCMHC 

## 2023-05-12 NOTE — Telephone Encounter (Signed)
Request sent for PA.

## 2023-05-12 NOTE — Progress Notes (Addendum)
 [facility] Counselor Initial Adult Exam  Name: Jaya Lapka Date: 05/12/2023 MRN: [id number] DOB: 04/14/1966 PCP: Donley Furth, MD  Time spent: 60 minutes, 9:01 AM until 10:01 AM spent face-to-face with the patient in the outpatient therapist office.  Guardian/Payee: Self  Paperwork requested: No   Reason for Visit /Presenting Problem: Stress/anxiety/depression/anger  Maela is a 57 year old single female who presents with systems of anxiety and depression and anger.  She was living with and helping take care of her mother but her mother's dementia progressed so she [place] facility where she can get better care and is closer to her sister.  She moved  next door to her biological brother in a house that he owned.  She said that she told him she could not do yard work and he said it was fine but after a while when she could not keep up with that he had his wife decided that the patient had to move out.  He said that created a rift with her brother which makes her very sad and that she has had no conversation with him since March 2023.  She moved from there to [place].   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  She became very frustrated 1 day and says she initially went into the break room to calm down but decided that she needed to speak to human resources.  She said that she blurted out that if she had a gun she would shoot her supervisor and although she did not mean it the person she was speaking to an HR ask her to repeat it.  She walked out of the meeting but then saw that same woman and the HR supervisor as well as a Solicitor coming to talk to her.  They told her  that they see her as a valuable employee and that they did not believe she would do that but that they could no longer keep her because of the threat that she communicated so she lost a job working where she loved to work.  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  She cannot make more since she is not a Education administrator.  We did talk about looking at getting that certification if she could make that work because she loves to help people.  She lost her job at [employer] facility in May of this year.  The patient has had cancer of 4 different times.  In 1996 that she had a malignant tumor in her abdominal area which were removed found out that it weighed 6 pounds.  There was unexpected weight gain which is why she went to medical professionals to get it checked out.  She was living in [place]  at the time and went to the [facility] in [place].  In 1997 they found melanomas on her chest which they said that had spread like branches on a tree and that had to be treated.  In 2016 they found 15 or tumors in her abdominal area which had to  be treated.  She was living in [place]  at the time.  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  She also has diabetes, sleep apnea and neuropathy.  She also has had significant losses in her life.  We did not look much at her history and we will work on that in the next session but she said that her parents were separated when she was very young.  Her sister told her later that her mother did not want her when she was pregnant with her so she has never had a great connection to her mother.  She did help take care of her after her mother was diagnosed with dementia and still checks on her.  She was very close to her father and losing him was very traumatic for her.  She also was very close to her stepmother who passed away.  She had a sister who died of ovarian  cancer as well as a brother who died of cancer.  She has a strained relationship with her brother who lives nearby now.  Because of the patient's cancer she has not been able to have children and that has been difficult for her.  She reports a very good relationship with her primary care physician Dr. Chantal Comment as well as his staff.  During some of the difficulties he recommended inpatient treatment and she went to [facility] where she stayed for 4 days but said she did not feel it was beneficial.  A social worker helped her find a therapist when she was discharged but did not feel that she had a good connection with that therapist.  She is currently being prescribed Lamictal  and Xanax  at night to help with sleep but says she still does not sleep well.  She said she can wake up anywhere between 1-2 or 3:00 in the morning to be awake for an hour or more and have difficulty getting back to sleep.  She does feel she is getting some benefit from Effexor  which she takes in the morning but says she feels that those should be stronger.  She did send a my chart message to Dr. Alyne Babinski along those regards.  She does have 1 very good support in the area.  She describes this person as being her best friend and that family including her best friend husband and children have adopted the patient into their family and she does spend a significant amount of time with them.  She says that at times she compared herself to that for him because everything seems to go right for that female.  She is thankful for her support.  She also is very close to her sister who [place] and runs a restaurant but says that sister is busy but they find time to speak daily.  Her faith is important to her and she has a degree in theology from [employer].  He has a bachelor's degree.  Because of the job caring for women her boss would not let her have Sundays off so she does not get to church very often on  Sundays or Wednesdays and has been difficult for her.  The patient reports some history of depression especially with all of her losses.  She acknowledges that she has had some suicidal thoughts in the past saying at times she was tempted to drive off a bridge or have had thoughts of putting a gun to her head  but she would never do that.  She has called [phone] on several occasions to cope.  She does report some anxiety with several triggers including work stress financial stress etc.  She reports that anger is a big issue for her.  She says that she is not afraid to speak her mind and has gotten her in trouble for having a strong emotional response a lot of times.  She says often times she speaks before thinking when she is angry but recognizes that some of the anger triggers are the grief, medical issues, so many losses etc.  She said she does not like to use bad language but when she gets angry is almost like she cannot stop what she is saying and uses profanity.  She also acknowledges being an emotional eater which does not help her weight issues.  She would like to look at other things to do in place of emotional eating.  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  She is able to meet in office on Monday or Tuesday so we will plan to schedule sessions in that time window.    She does contract for safety saying she currently has no thoughts of hurting herself or anyone else.  Mental Status Exam: Appearance:   Well Groomed     Behavior:  Appropriate  Motor:  Normal  Speech/Language:   Normal Rate  Affect:  Appropriate  Mood:  anxious  Thought process:  normal  Thought content:    WNL  Sensory/Perceptual disturbances:    WNL  Orientation:  oriented to person, place, time/date, situation, day of week, month of year, and year  Attention:  Good   Concentration:  Good  Memory:  WNL  Fund of knowledge:   Good  Insight:    Good  Judgment:   Good  Impulse Control:  Good    Reported Symptoms: Anxiety, depression, anger  Risk Assessment: Danger to Self:  No Self-injurious Behavior: No Danger to Others: No Duty to Warn:no Physical Aggression / Violence:No  Access to Firearms a concern: No  Gang Involvement:No  Patient / guardian was educated about steps to take if suicide or homicide risk level increases between visits: n/a While future psychiatric events cannot be accurately predicted, the patient does not currently require acute inpatient psychiatric care and does not currently meet [place]  involuntary commitment criteria.  Substance Abuse History: Current substance abuse: No     Past Psychiatric History:   Previous psychological history is significant for anxiety, depression, and irritability/anger Outpatient Providers: Primary care physician is Dr. Hedda Liv History of Psych Hospitalization: Yes  Psychological Testing: n/a   Abuse History:  Victim of: No., None reported during initial session.   Report needed: No. Victim of Neglect:No. Perpetrator of reported  Witness / Exposure to Domestic Violence: None reported during initial session  Protective Services Involvement: No  Witness to [employer] Violence:  No   Family History:  Family History  Problem Relation Age of Onset   Dementia Mother    Asthma Mother    Multiple sclerosis Father    Uterine cancer Sister 49   Diabetes Brother    Diabetes Brother    Basal cell carcinoma Brother        lower lip   Diabetes Maternal Grandfather  Colon polyps Paternal Grandmother    Stomach cancer Paternal Grandmother        dx 12s-80s   Colon polyps Paternal Grandfather    Arthritis Other    Prostate cancer Other        grandfather per pt but she is unaware of which grandfather   Coronary artery disease Other    Pancreatic cancer Neg Hx    Esophageal  cancer Neg Hx    Rectal cancer Neg Hx     Living situation: the patient lives alone  Sexual Orientation: Did not discuss during initial session  Relationship Status: single  Name of spouse / other: If a parent, number of children / ages: The patient is not able to have children due to cancer she had when she was younger  Support Systems: friends Church friends, sister  Surveyor, quantity Stress:  Yes   Income/Employment/Disability: Employment  Financial planner: No   Educational History: Education: Risk manager: Protestant  Any cultural differences that may affect / interfere with treatment:  not applicable   Recreation/Hobbies: The patient enjoys time with her sister who lives in the mountains but does not have much time to see her.  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  Stressors: Financial difficulties   Health problems   Loss of multiple family members and friends   Marital or family conflict    Strengths: Supportive Relationships, Family, Church, Spirituality, and Able to Communicate Effectively  Barriers:     Legal History: Pending legal issue / charges: The patient has no significant history of legal issues. History of legal issue / charges: n/a  Medical History/Surgical History: reviewed Past Medical History:  Diagnosis Date   Anal fissure    saw Dr. Darcus Eastern    Anxiety    on meds   Arthritis    on meds   Asthma    uses inhaler if needed   Carpal tunnel syndrome    Depression    on meds   Diabetes mellitus    on meds   GERD (gastroesophageal reflux disease)    on meds   History of ovarian cancer in adulthood    Hyperlipidemia    on meds   Hypertension    on meds   Ovarian cancer [facility]) 1986   1996   Skin cancer (melanoma) (HCC) 1997   Sleep apnea    uses CPAP    Tubular adenoma of colon 01/2014    Past Surgical History:  Procedure Laterality Date   ABDOMINAL HYSTERECTOMY  06/30/1984   BSO    BASAL CELL CARCINOMA EXCISION  10/09/2012   lower lip per Dr. Roel Clarity    COLONOSCOPY  03/23/2019   per Dr. General Kenner, adenomatous polyps, repeat in 3 yrs   GLBS tumor ovary 1986     HEMORRHOID SURGERY     HIP FRACTURE SURGERY Right 2007   MELANOMA EXCISION  07/01/1995   upper chest     Medications: Current Outpatient Medications  Medication Sig Dispense Refill   albuterol  (VENTOLIN  HFA) 108 (90 Base) MCG/ACT inhaler Inhale 2 puffs into the lungs every 6 (six) hours as needed for wheezing or shortness of breath. 8 g 6   ALPRAZolam  (XANAX ) 1 MG tablet Take 1 tablet (1 mg total) by mouth at bedtime as needed for sleep. 90 tablet 1   atorvastatin  (LIPITOR ) 80 MG tablet Take 1 tablet (80 mg total) by mouth daily. 90 tablet 1   glimepiride  (AMARYL ) 1 MG tablet Take 1 tablet (  1 mg total) by mouth daily with breakfast. 90 tablet 3   lamoTRIgine  (LAMICTAL ) 25 MG tablet Take 1 tablet (25 mg total) by mouth at bedtime. 90 tablet 1   losartan -hydrochlorothiazide  (HYZAAR) 50-12.5 MG tablet Take 1 tablet by mouth daily. 90 tablet 1   metFORMIN  (GLUCOPHAGE -XR) 500 MG 24 hr tablet Take 1 tablet (500 mg total) by mouth daily with breakfast. 90 tablet 3   metoprolol  tartrate (LOPRESSOR ) 50 MG tablet Take 1 tablet (50 mg total) by mouth 2 (two) times daily. 180 tablet 1   omeprazole  (PRILOSEC) 40 MG capsule Take 1 capsule (40 mg total) by mouth 2 (two) times daily. 180 capsule 2   Semaglutide  (RYBELSUS ) 14 MG TABS Take 1 tablet (14 mg total) by mouth daily. 90 tablet 3   venlafaxine  XR (EFFEXOR -XR) 150 MG 24 hr capsule Take 1 capsule (150 mg total) by mouth daily. 90 capsule 1   No current facility-administered medications for this visit.    Allergies  Allergen Reactions   Lisinopril  Cough    Diagnoses:  Generalized anxiety disorder, major depressive disorder, recurrent, moderate.  Irritability/anger  Plan of Care: The patient prefers to meet in person so through 2024 we will meet every 3 weeks  moving to every 2 weeks at the beginning of 2025 due to office appointment availability.   Cecile Coder, [facility]

## 2023-05-12 NOTE — Telephone Encounter (Signed)
Rybelsus needs PA  

## 2023-05-13 NOTE — Telephone Encounter (Signed)
Tell her to increase the Lamotrigine to 2 pills (50 mg) at bedtime. She will need an in person OV to examine the arm

## 2023-05-14 ENCOUNTER — Other Ambulatory Visit (HOSPITAL_COMMUNITY): Payer: Self-pay

## 2023-05-14 ENCOUNTER — Telehealth: Payer: Self-pay

## 2023-05-14 NOTE — Telephone Encounter (Signed)
Pharmacy Patient Advocate Encounter   Received notification from Pt Calls Messages that prior authorization for Rybelsus is required/requested.   Insurance verification completed.   The patient is insured through [facility] .   Per test claim: PA required; PA submitted to above mentioned insurance via CoverMyMeds Key/confirmation #/EOC [id number] Status is pending

## 2023-05-15 ENCOUNTER — Ambulatory Visit: Payer: Medicaid Other | Admitting: Family Medicine

## 2023-05-15 NOTE — Telephone Encounter (Signed)
Make sure she is taking the Venlafaxine in the morning, not at night. If she is taking it in the morning now, we may need to stop it

## 2023-05-18 ENCOUNTER — Ambulatory Visit: Payer: Medicaid Other | Admitting: Family Medicine

## 2023-05-18 ENCOUNTER — Other Ambulatory Visit: Payer: Self-pay | Admitting: Internal Medicine

## 2023-05-18 ENCOUNTER — Telehealth: Payer: Self-pay

## 2023-05-18 ENCOUNTER — Encounter: Payer: Self-pay | Admitting: Internal Medicine

## 2023-05-18 NOTE — Telephone Encounter (Signed)
Can we do an appeal on the Rybelsus per Dr. Lonzo Cloud

## 2023-05-19 ENCOUNTER — Telehealth: Payer: Self-pay

## 2023-05-19 ENCOUNTER — Other Ambulatory Visit (HOSPITAL_COMMUNITY): Payer: Self-pay

## 2023-05-19 ENCOUNTER — Encounter: Payer: Self-pay | Admitting: Behavioral Health

## 2023-05-19 MED ORDER — SEMAGLUTIDE (1 MG/DOSE) 4 MG/3ML ~~LOC~~ SOPN: 1.0000 mg | PEN_INJECTOR | SUBCUTANEOUS | 3 refills | Status: DC

## 2023-05-19 NOTE — Telephone Encounter (Signed)
Patient aware and will callback if she has any questions

## 2023-05-19 NOTE — Telephone Encounter (Signed)
Pharmacy Patient Advocate Encounter   Received notification from CoverMyMeds that prior authorization for Ozempic is required/requested.   Insurance verification completed.   The patient is insured through [facility] .   Per test claim: PA required; PA submitted to above mentioned insurance via CoverMyMeds Key/confirmation #/EOC [id number] Status is pending

## 2023-05-19 NOTE — Telephone Encounter (Signed)
Patient willing to do the injection

## 2023-05-19 NOTE — Telephone Encounter (Signed)
I found the denial letter.     I see Dr. Lonzo Cloud is requesting an appeal, I just wanted to make sure you had this information first. It looks like they would prefer that she tries 2 of these injectables.

## 2023-05-19 NOTE — Addendum Note (Signed)
Addended by: Scarlette Shorts on: 05/19/2023 02:29 PM   Modules accepted: Orders

## 2023-05-20 ENCOUNTER — Other Ambulatory Visit (HOSPITAL_COMMUNITY): Payer: Self-pay

## 2023-05-20 ENCOUNTER — Telehealth: Payer: Self-pay | Admitting: Behavioral Health

## 2023-05-20 ENCOUNTER — Encounter: Payer: Self-pay | Admitting: Family Medicine

## 2023-05-20 NOTE — Telephone Encounter (Signed)
Pharmacy Patient Advocate Encounter  Received notification from [facility] that Prior Authorization for Ozempic has been APPROVED through 05/18/2024

## 2023-05-20 NOTE — Telephone Encounter (Signed)
Proceed with the appeal

## 2023-05-20 NOTE — Telephone Encounter (Signed)
I called the patient after receiving a my chart message from her about a situation at work which made her extremely angry.  That note is in the chart.  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  He said they valued that nurse and they did not want to upset her.  The patient took that as the man not valuing her.  She told him that the nurse could have done it and would only take in a minute and the patient did do it and it took her 1 minute.  The patient said she had very bad thoughts about hurting the man that she was caring for but did not do anything.  She reached out to her manager who helped her try to calm down.  She called her mother who also tried to help calm her down and encouraged her to reach out to me which she did via my chart.  We took a few minutes to look at why that made her so angry and we looked at some relaxation skills and cognitive reframing skills as well as calming skills which I encouraged her use.  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  I am meeting with her in early December but told her if we needed to meet sooner we would find a way to make that work.  She feels like that appointment will be okay.

## 2023-05-21 NOTE — Telephone Encounter (Signed)
Appeal has been submitted for Rybelsus. Will advise when response is received or follow up in 1 week. Please be advised that most companies may take 30 days to make a decision.   Dellie Burns, PharmD Clinical Pharmacist [place]  Direct Dial: [phone]

## 2023-05-25 ENCOUNTER — Ambulatory Visit: Payer: Medicaid Other | Admitting: Family Medicine

## 2023-05-26 ENCOUNTER — Ambulatory Visit (INDEPENDENT_AMBULATORY_CARE_PROVIDER_SITE_OTHER): Payer: Medicaid Other | Admitting: Family Medicine

## 2023-05-26 ENCOUNTER — Encounter: Payer: Self-pay | Admitting: Family Medicine

## 2023-05-26 ENCOUNTER — Encounter: Payer: Self-pay | Admitting: Behavioral Health

## 2023-05-26 VITALS — BP 128/80 | HR 66 | Temp 97.7°F | Wt 219.0 lb

## 2023-05-26 DIAGNOSIS — F419 Anxiety disorder, unspecified: Secondary | ICD-10-CM | POA: Diagnosis not present

## 2023-05-26 DIAGNOSIS — G4700 Insomnia, unspecified: Secondary | ICD-10-CM

## 2023-05-26 DIAGNOSIS — F32A Depression, unspecified: Secondary | ICD-10-CM | POA: Diagnosis not present

## 2023-05-26 MED ORDER — NORTRIPTYLINE HCL 10 MG PO CAPS: 10.0000 mg | ORAL_CAPSULE | Freq: Every day | ORAL | 3 refills | Status: DC

## 2023-05-26 MED ORDER — ALPRAZOLAM 1 MG PO TABS: ORAL_TABLET | ORAL | Status: DC

## 2023-05-26 NOTE — Telephone Encounter (Signed)
Pt was seen at the office this morning

## 2023-05-26 NOTE — Progress Notes (Signed)
   Subjective:    Patient ID: Morgan [name], female    DOB: 1965-12-29, 57 y.o.   MRN: [id number]  HPI Here to follow up on anxiety and depression, as well as insomnia. She changed from Lamotrigine to Nortriptyline 10 mg at bedtime a few months ago, and this in conjunction with 1 mg of Xanax help her sleep fairly well. Her depression has been fairly well controlled lately, but she has felt very anxious. She worries about things and cannot relax. She meets with her therapist, Serafina Mitchell, regularly, and she finds this to be helpful .   Review of Systems  Constitutional: Negative.   Respiratory: Negative.    Cardiovascular: Negative.   Psychiatric/Behavioral:  Positive for agitation, decreased concentration and dysphoric mood. Negative for behavioral problems, confusion, hallucinations, self-injury, sleep disturbance and suicidal ideas. The patient is nervous/anxious.        Objective:   Physical Exam Constitutional:      Appearance: Normal appearance.  Cardiovascular:     Rate and Rhythm: Normal rate and regular rhythm.     Pulses: Normal pulses.     Heart sounds: Normal heart sounds.  Pulmonary:     Effort: Pulmonary effort is normal.     Breath sounds: Normal breath sounds.  Neurological:     Mental Status: She is alert and oriented to person, place, and time.  Psychiatric:        Behavior: Behavior normal.        Thought Content: Thought content normal.     Comments: She is somewhat anxious            Assessment & Plan:  Her insomnia is stable, so she will stay on Xanax 1 mg and Nortriptyline 10 mg at bedtime. She will continue to take Venlafaxine XR 150 mg every morning. We will add 1/2 tablet (or 0.5 mg) of Xanax BID as needed for anxiety.  Gershon Crane, MD

## 2023-05-27 NOTE — Progress Notes (Signed)
Pt had appointment with Dr Clent Ridges for this problem

## 2023-05-29 ENCOUNTER — Encounter: Payer: Self-pay | Admitting: Family Medicine

## 2023-05-29 NOTE — Telephone Encounter (Signed)
 Care team updated and letter sent for eye exam notes.

## 2023-06-01 NOTE — Telephone Encounter (Signed)
I understand.

## 2023-06-02 ENCOUNTER — Other Ambulatory Visit: Payer: Self-pay

## 2023-06-02 ENCOUNTER — Other Ambulatory Visit: Payer: Self-pay | Admitting: Internal Medicine

## 2023-06-02 MED ORDER — RYBELSUS 14 MG PO TABS: 14.0000 mg | ORAL_TABLET | Freq: Every day | ORAL | 1 refills | Status: DC

## 2023-06-02 NOTE — Telephone Encounter (Signed)
Let's see if this medication schedule works for her. If so, I will write for a 90 day supply

## 2023-06-02 NOTE — Telephone Encounter (Signed)
Have you processed appeal for the Rybelsus.

## 2023-06-03 ENCOUNTER — Telehealth: Payer: Self-pay | Admitting: Internal Medicine

## 2023-06-03 DIAGNOSIS — G4733 Obstructive sleep apnea (adult) (pediatric): Secondary | ICD-10-CM | POA: Diagnosis not present

## 2023-06-03 NOTE — Telephone Encounter (Signed)
Patient dropped off patient assistance paperwork.  This was placed in the providers in box at the front desk.  Company is [employer]

## 2023-06-03 NOTE — Telephone Encounter (Signed)
No problem! We will get paperwork sent off

## 2023-06-08 ENCOUNTER — Other Ambulatory Visit (HOSPITAL_COMMUNITY): Payer: Self-pay

## 2023-06-08 ENCOUNTER — Encounter: Payer: Self-pay | Admitting: Behavioral Health

## 2023-06-08 ENCOUNTER — Ambulatory Visit: Payer: Medicaid Other | Admitting: Behavioral Health

## 2023-06-08 DIAGNOSIS — F411 Generalized anxiety disorder: Secondary | ICD-10-CM | POA: Diagnosis not present

## 2023-06-08 DIAGNOSIS — F322 Major depressive disorder, single episode, severe without psychotic features: Secondary | ICD-10-CM | POA: Diagnosis not present

## 2023-06-08 DIAGNOSIS — R454 Irritability and anger: Secondary | ICD-10-CM | POA: Diagnosis not present

## 2023-06-08 NOTE — Progress Notes (Addendum)
 [facility] Counselor/Therapist Progress Note  Patient ID: Morgan [name], MRN: [id number],    Date: 06/08/2023  Time Spent: 57 minutes, 1 PM until 1:57 PM spent face-to-face with the patient in the outpatient therapist office.  Treatment Type: Individual Therapy  Reported Symptoms: Anxiety, depression, irritability  Mental Status Exam: Appearance:  Casual     Behavior: Appropriate  Motor: Normal  Speech/Language:  Normal Rate  Affect: Appropriate  Mood: normal  Thought process: normal  Thought content:   WNL  Sensory/Perceptual disturbances:   WNL  Orientation: oriented to person, place, time/date, situation, day of week, month of year, and year  Attention: Good  Concentration: Good  Memory: WNL  Fund of knowledge:  Good  Insight:   Good  Judgment:  Good  Impulse Control: Good   Risk Assessment: Danger to Self:  The patient has had thoughts of being overwhelmed but says she will contract for safety not hurt herself or anyone else. Self-injurious Behavior: No Danger to Others: No Duty to Warn:no Physical Aggression / Violence:No  Access to Firearms a concern: No  Gang Involvement:No   Subjective: The patient reports with symptoms of anxiety depression and irritability.  The patient has several triggers for her stress and anxiety and irritability.  Financially things are very difficult for her because she is not making very much per hour.  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  She then spoke out of frustration to her Production designer, theatre/television/film.  We validated her being hurt by the way she was spoken to but we also talked about ways that she could reframe how she responded to the daughter  and her Production designer, theatre/television/film.  She recognizes that she has to learn to manage that because she does not want to lose the job.  We looked at mindfulness and thinking about what she is thinking about and how she practices that but how that benefits her choosing to react in a different way.  We also role played how she could react differently.  We also begin to look at some of the history behind what she is feeling.  She loves her church because of work does not get there consistently but feels loved embraced and cared for there.  She has a great relationship with her sister who lives 3 hours away and cannot see her very often although we talk at least twice per week.  She has a best friend who she knows through church and gets to spend a lot of time with them.  There is the added stress of having to work 40+ hours a week to be able to afford basics.  She knows that growing up her relationship with her mother was poor.  She was not hugged or  told she was loved.  She even lived with her mother as an adult and tried to care for her until it became too much.  She feels a sense of obligation to at least respectfully care for her mother but says she does not love her mother.  Her mother and her dementia is now asking the patient to give her a hug and asking for a low but the patient for now was not able  to do that.  We talked about how emotional connections were started at a very young age and the patient did not have that with her mother.  Thankfully her father and step mother were very good to her as was her sister.  She acknowledges that she is pretty lonely when she is not working so encouraged her to reach out to friends but also to look for other places where she can plug and that will benefit her spiritually and socially.  I encouraged her to reach out to other churches in the area to see what Bible studies they have as she can get to the one at her church because of her work schedule.  She does contract for safety having no  thoughts of hurting herself or anyone else.  Interventions: Cognitive Behavioral Therapy and Dialectical Behavioral Therapy  Diagnosis: Generalized anxiety disorder, major depressive disorder, moderate to severe, irritability  Plan: I will meet with the patient every 2 to 3 weeks in person.  Treatment plan: We will use cognitive behavioral therapy principles as well as elements of dialectical behavior therapy to reduce the patient's anxiety depression and irritability by at least 50% with a target goal of Nov 27, 2023.  Goals for her depression will be for the patient to have less sadness as indicated by patient report and PHQ-9 scores, have improved mood and return to a healthier level of functioning as well as identify causes for depressed mood and learn ways to reduce depression and using a crisis plan if patient has active suicidal thoughts.  Interventions include using cognitive behavioral therapy to explore and replace unhealthy thought and behavior patterns contributing to depression, provide education about depression to help her understand its causes, teach and encouraged use of coping skills to reduce depression and make a crisis plan and assess ongoing level of safety.  Goals for reducing anxiety include improving her ability to manage anxiety/stress symptoms, identify causes for anxiety and stress and explore ways to reduce it, resolve core conflicts including family history contributing to it and help her manage thoughts and worrisome thinking contributing to feelings of anxiety.  Interventions for reducing anxiety include providing education about anxiety to help around identify its causes symptoms and triggers, facilitate problem solution skills and coping skills for reducing anxiety.  We will also use cognitive behavioral therapy to identify and change anxiety provoking thought and behavior patterns as well as use dialectical behavior therapy to reduce anxiety through distress tolerance and  mindfulness skills.  Goals for reducing irritability/anger include helping the patient felt an awareness of anger behaviors and alternatives to using aggressive behavior.  We will also look at learning ways to handle angry feelings in constructive ways that cannot hands daily functioning for the patient.  Interventions including using dialectical behavior therapy mindfulness skills, cognitive behavioral therapy to identify negative consequences for expressing anger and irritability and unhealthy ways.  We will also use cognitive behavioral therapy to explore and replace unhealthy thought and behavior patterns contributing to feelings of resentment and disappointment.  We will identify core values that may not being met that are contributing to feelings of anger.  We will confront and reflects the patient's behaviors that happened when both angry and out of session.  We will assist the patient in looking at how anger was modeled for her and how this positively or negatively impacted her.  We will also teach respectively assertive communication skills as well as self soothing skills to help the patient better manage anger  reactions.  Cecile Coder, [facility]

## 2023-06-08 NOTE — Telephone Encounter (Signed)
Any update on the [employer] PA.

## 2023-06-08 NOTE — Telephone Encounter (Signed)
We have not received a determination. It can take up to 30 days for an appeal.  However, it looks like she picked up a 30 day supply on 06/04/23

## 2023-06-08 NOTE — Progress Notes (Signed)
                Yurika Pereda M Shanzay Hepworth, LCMHC 

## 2023-06-09 DIAGNOSIS — G4733 Obstructive sleep apnea (adult) (pediatric): Secondary | ICD-10-CM | POA: Diagnosis not present

## 2023-06-11 ENCOUNTER — Other Ambulatory Visit (HOSPITAL_COMMUNITY): Payer: Self-pay

## 2023-06-15 ENCOUNTER — Ambulatory Visit (INDEPENDENT_AMBULATORY_CARE_PROVIDER_SITE_OTHER): Payer: Medicaid Other | Admitting: Family Medicine

## 2023-06-15 ENCOUNTER — Encounter: Payer: Self-pay | Admitting: Family Medicine

## 2023-06-15 VITALS — BP 110/76 | HR 67 | Temp 97.7°F | Wt 227.0 lb

## 2023-06-15 DIAGNOSIS — M7702 Medial epicondylitis, left elbow: Secondary | ICD-10-CM

## 2023-06-15 DIAGNOSIS — M7712 Lateral epicondylitis, left elbow: Secondary | ICD-10-CM | POA: Diagnosis not present

## 2023-06-15 MED ORDER — OMEPRAZOLE 40 MG PO CPDR: 40.0000 mg | DELAYED_RELEASE_CAPSULE | Freq: Two times a day (BID) | ORAL | 0 refills | Status: DC

## 2023-06-15 NOTE — Progress Notes (Signed)
   Subjective:    Patient ID: Morgan [name], female    DOB: Nov 02, 1965, 57 y.o.   MRN: [id number]  HPI Here for 2 months of intermittent pains in the left arm. She points to the medial and lateral parts of her elbow as the locations of these pains. No recent trauma.    Review of Systems  Constitutional: Negative.   Respiratory: Negative.    Cardiovascular: Negative.   Musculoskeletal:  Positive for arthralgias.       Objective:   Physical Exam Constitutional:      Appearance: Normal appearance.  Cardiovascular:     Rate and Rhythm: Normal rate and regular rhythm.     Pulses: Normal pulses.     Heart sounds: Normal heart sounds.  Pulmonary:     Effort: Pulmonary effort is normal.     Breath sounds: Normal breath sounds.  Musculoskeletal:     Comments: The left arm and elbow appear normal. ROM is full. She is tender over the medial and lateral epicondyles.   Neurological:     Mental Status: She is alert.           Assessment & Plan:  She has epicondylitis, both medial and lateral. She will rest the arm and apply ice packs, apply Voltaren gel up to 4 times daily as needed.  Gershon Crane, MD

## 2023-06-17 ENCOUNTER — Other Ambulatory Visit (HOSPITAL_COMMUNITY): Payer: Self-pay

## 2023-06-17 NOTE — Telephone Encounter (Signed)
Appeal for [employer] was accepted and approved from 06/02/2023 - 06/01/2024.  Reference #[id number]

## 2023-06-22 ENCOUNTER — Telehealth: Payer: Medicaid Other | Admitting: Primary Care

## 2023-06-22 ENCOUNTER — Other Ambulatory Visit: Payer: Self-pay | Admitting: *Deleted

## 2023-06-22 DIAGNOSIS — G4730 Sleep apnea, unspecified: Secondary | ICD-10-CM | POA: Diagnosis not present

## 2023-06-22 DIAGNOSIS — G4733 Obstructive sleep apnea (adult) (pediatric): Secondary | ICD-10-CM

## 2023-06-22 MED ORDER — OMEPRAZOLE 40 MG PO CPDR: 40.0000 mg | DELAYED_RELEASE_CAPSULE | Freq: Two times a day (BID) | ORAL | 0 refills | Status: DC

## 2023-06-22 NOTE — Progress Notes (Unsigned)
Virtual Visit via Video Note  I connected with Morgan [name] on 06/22/23 at  3:30 PM EST by a video enabled telemedicine application and verified that I am speaking with the correct person using two identifiers.  Location: Patient: *** Provider: ***   I discussed the limitations of evaluation and management by telemedicine and the availability of in person appointments. The patient expressed understanding and agreed to proceed.  History of Present Illness:    Discussed the use of AI scribe software for clinical note transcription with the patient, who gave verbal consent to proceed.  History of Present Illness   The patient, with a history of severe sleep apnea, has been compliant with the use of a new CPAP machine, which was received to replace an outdated one. The current pressure setting is at 15. The patient's apnea score is less than 5, indicating well-controlled sleep apnea. The patient's sleep study initially showed a score of 110, which has significantly improved with the use of the CPAP machine. The patient is at low risk for sleep apnea causing any cardiac arrhythmias, stroke, pulmonary hypertension, or diabetes.  In addition to sleep apnea, the patient has been dealing with anxiety and depression. She has been attending behavioral therapy and has started a new job. The patient is on medication, including Lamictal, Effexor, and Xanax. The patient has been making progress with the new job and has started seeing a Therapist, nutritional. The patient has been taking her medication as directed.  The patient also has a history of using oxygen therapy, with a setting of one liter. The patient's oxygen saturation levels have been up to 97%. The patient has been able to pick up her prescription for the oxygen therapy.      05/20/2023 - 06/18/2023 Usage days 30/30 days (100%) greater than 4 hours Average usage 7 hours 16 minutes Pressure 15 cm H2O Air leaks 32.6 L/min (95%) AHI 1.2    Observations/Objective:   Assessment and Plan: Assessment and Plan    Severe Sleep Apnea Well controlled with CPAP at a pressure of 15. Compliance is excellent with an average usage of 7 hours 16 minutes per night. Apnea score is 1.2, indicating effective treatment. -Continue current CPAP settings. -Annual follow-up in December 2025 unless issues arise.  Anxiety and Depression Patient is attending behavioral therapy and is on Lamictal, Effexor, and Xanax. Recent stressors include the holiday season and a new job. -Continue current medications and therapy. -Encouraged patient to reach out if support is needed.        Follow Up Instructions:    I discussed the assessment and treatment plan with the patient. The patient was provided an opportunity to ask questions and all were answered. The patient agreed with the plan and demonstrated an understanding of the instructions.   The patient was advised to call back or seek an in-person evaluation if the symptoms worsen or if the condition fails to improve as anticipated.  I provided *** minutes of non-face-to-face time during this encounter.   Glenford Bayley, NP

## 2023-07-03 ENCOUNTER — Telehealth: Payer: Self-pay

## 2023-07-03 NOTE — Telephone Encounter (Signed)
 Patient advise that the appeal was approved through [facility] for the [employer].Letter received through mail.

## 2023-07-07 ENCOUNTER — Encounter: Payer: Self-pay | Admitting: Behavioral Health

## 2023-07-07 ENCOUNTER — Ambulatory Visit: Payer: Medicaid Other | Admitting: Behavioral Health

## 2023-07-07 DIAGNOSIS — F411 Generalized anxiety disorder: Secondary | ICD-10-CM | POA: Diagnosis not present

## 2023-07-07 DIAGNOSIS — F331 Major depressive disorder, recurrent, moderate: Secondary | ICD-10-CM

## 2023-07-07 DIAGNOSIS — R454 Irritability and anger: Secondary | ICD-10-CM

## 2023-07-07 NOTE — Progress Notes (Addendum)
 [facility] Counselor/Therapist Progress Note  Patient ID: Morgan [name], MRN: [id number],    Date: 07/07/2023  Time Spent: 60 minutes, 9 AM until 10 AM spent face-to-face with the patient in the outpatient therapist office.  Treatment Type: Individual Therapy  Reported Symptoms: Anxiety, depression, irritability  Mental Status Exam: Appearance:  Casual     Behavior: Appropriate  Motor: Normal  Speech/Language:  Normal Rate  Affect: Appropriate  Mood: normal  Thought process: normal  Thought content:   WNL  Sensory/Perceptual disturbances:   WNL  Orientation: oriented to person, place, time/date, situation, day of week, month of year, and year  Attention: Good  Concentration: Good  Memory: WNL  Fund of knowledge:  Good  Insight:   Good  Judgment:  Good  Impulse Control: Good   Risk Assessment: Danger to Self:  The patient has had thoughts of being overwhelmed but says she will contract for safety not hurt herself or anyone else. 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  The woman's daughter who lives in another city called and screamed at the patient because of what she found her for dinner.  The patient said that she is a diabetic and so was her patient and she knew that she was doing the right thing but her daughter became very upset.  She did  remain relatively calm but was direct with the daughter and was proud of the fact that she did not get angry or scream.  She did call her boss and they are going to step away from working with that woman primarily because of the woman's daughter.    That means she will not have to work on Sunday and she is thankful to be able to go back to church but did not like how angry that situation made her.   We will continue to process how her anger is triggered by including not feeling supported, having multiple losses, and some of her upbringing which we continue to unpack.  There also was some financial stress because of how much she makes per hour and she has worked almost 7 days a week since October.  We talked about the importance first and foremost of having some time off but we started to look at a plan to what would be best for the patient.  We talked about looking at other home care agencies that might pay more and offer benefits.  Her rent needs to be renewed and her rent is affordable but she would have to lock into 1 year.  We talked about the possibility of moving closer to her sister and her mom and brother for her to  .  I encouraged her to look at housing cost as well as job opportunities in both places.  We put some coping skills in place for helping her be mindful of what is triggering her anger and ways to cope with that.  I encouraged her to journal and also since her faith is important to her to make sure that she spent time every day in prayer or meditation Bible study etc.  She does contract for safety having no thoughts of hurting herself or anyone else.  Interventions: Cognitive Behavioral Therapy and Dialectical Behavioral Therapy  Diagnosis: Generalized anxiety disorder, major depressive disorder, moderate  to severe, irritability  Plan: I will meet with the patient every 2 to 3 weeks in person.  Treatment plan: We will use cognitive behavioral therapy principles as well as elements of dialectical behavior therapy to reduce the patient's anxiety depression and irritability by at least 50% with a target goal of Nov 27, 2023.  Goals for her depression will be for the patient to have less sadness as indicated by patient report and PHQ-9 scores, have improved mood and return to a healthier level of functioning as well as identify causes for depressed mood and learn ways  to reduce depression and using a crisis plan if patient has active suicidal thoughts.  Interventions include using cognitive behavioral therapy to explore and replace unhealthy thought and behavior patterns contributing to depression, provide education about depression to help her understand its causes, teach and encouraged use of coping skills to reduce depression and make a crisis plan and assess ongoing level of safety.  Goals for reducing anxiety include improving her ability to manage anxiety/stress symptoms, identify causes for anxiety and stress and explore ways to reduce it, resolve core conflicts including family history contributing to it and help her manage thoughts and worrisome thinking contributing to feelings of anxiety.  Interventions for reducing anxiety include providing education about anxiety to help around identify its causes symptoms and triggers, facilitate problem solution skills and coping skills for reducing anxiety.  We will also use cognitive behavioral therapy to identify and change anxiety provoking thought and behavior patterns as well as use dialectical behavior therapy to reduce anxiety through distress tolerance and mindfulness skills.  Goals for reducing irritability/anger include helping the patient felt an awareness of anger behaviors and alternatives to using aggressive behavior.  We will also look at learning ways to handle angry feelings in constructive ways that cannot hands daily functioning for the patient.  Interventions including using dialectical behavior therapy mindfulness skills, cognitive behavioral therapy to identify negative consequences for expressing anger and irritability and unhealthy ways.  We will also use cognitive behavioral therapy to explore and replace unhealthy thought and behavior patterns contributing to feelings of resentment and disappointment.  We will identify core values that may not being met that are contributing to feelings of anger.  We  will confront and reflects the patient's behaviors that happened when both angry and out of session.  We will assist the patient in looking at how anger was modeled for her and how this positively or negatively impacted her.  We will also teach respectively assertive communication skills as well as self soothing skills to help the patient better manage anger reactions.  Cecile Coder, [facility]                  Cecile Coder, [facility]

## 2023-07-08 ENCOUNTER — Ambulatory Visit: Payer: Medicaid Other | Admitting: Nurse Practitioner

## 2023-07-08 ENCOUNTER — Encounter: Payer: Self-pay | Admitting: Nurse Practitioner

## 2023-07-08 VITALS — BP 120/72 | HR 72 | Ht 64.0 in | Wt 228.5 lb

## 2023-07-08 DIAGNOSIS — K449 Diaphragmatic hernia without obstruction or gangrene: Secondary | ICD-10-CM

## 2023-07-08 DIAGNOSIS — K219 Gastro-esophageal reflux disease without esophagitis: Secondary | ICD-10-CM | POA: Diagnosis not present

## 2023-07-08 DIAGNOSIS — Z860101 Personal history of adenomatous and serrated colon polyps: Secondary | ICD-10-CM | POA: Diagnosis not present

## 2023-07-08 MED ORDER — OMEPRAZOLE 40 MG PO CPDR: 40.0000 mg | DELAYED_RELEASE_CAPSULE | Freq: Two times a day (BID) | ORAL | 11 refills | Status: DC

## 2023-07-08 NOTE — Patient Instructions (Addendum)
 Acid Reflux  Below are some measures you can take to hopefully improve acid reflux symptoms . We may have discussed some of these today in the office. Not everything on this list may apply to you   --If you are taking anti-reflux ( GERD) medication be sure to take it 30 minutes to one hour before breakfast or dinner if you take it in the afternoon.  --Avoid late meals / bedtime snacks.   --Avoid trigger foods ( foods which you know tend to aggravate you reflux symptoms). Some common trigger foods include spicy foods, fatty foods, acidic foods, chocolate and caffeine.  --Elevate the head of bed 6-8 inches on blocks or bricks. If not able to elevate the head of the bed consider purchasing a wedge pillow to sleep on.    --Weight reduction / maintain a healthy BMI ( body mass index) may be help with reflux symptoms  --Sometimes with the above mentioned lifestyle changes patients are able to reduce the amount of GERD medications they take. Our goal is to have you on the lowest effective dose of medication  _______________________________________________________  If your blood pressure at your visit was 140/90 or greater, please contact your primary care physician to follow up on this.  _______________________________________________________  If you are age 60 or older, your body mass index should be between 23-30. Your Body mass index is 39.22 kg/m. If this is out of the aforementioned range listed, please consider follow up with your Primary Care Provider.  If you are age 19 or younger, your body mass index should be between 19-25. Your Body mass index is 39.22 kg/m. If this is out of the aformentioned range listed, please consider follow up with your Primary Care Provider.   ________________________________________________________  The [facility] providers would like to encourage you to use MYCHART to communicate with providers for non-urgent requests or questions.  Due to long hold times on  the telephone, sending your provider a message by [facility] may be a faster and more efficient way to get a response.  Please allow 48 business hours for a response.  Please remember that this is for non-urgent requests.   It was a pleasure to see you today!  Thank you for trusting me with your gastrointestinal care!    Vina Dasen, NP

## 2023-07-08 NOTE — Progress Notes (Signed)
 ASSESSMENT    Brief Narrative:  58 y.o.  female known to Dr. Leigh  with a past medical history not limited to DM2, ovarian cancer, melanoma, anxiety, depression, GERD, colon polyps, severe sleep apnea on CPAP  Chronic GERD / small hiatal hernia. Asymptomatic on PPI.  Dependent on BID dosing of PPI. Gets chest tightness if misses a dose of Omeprazole . Hasn't ever had an EGD but barium swallow years ago did show reflux.   History of multiple adenomatous colon polyps  See [facility] for any additional medical & surgical history   PLAN   --Discussed anti-reflux measures such as avoidance of late meals / bedtime snacks, HOB elevation (or use of wedge pillow), weight reduction ( if applicable)  / maintaining a healthy BMI ( body mass index),  and avoidance of trigger foods and caffeine.  --Continue Omeprazole  40 mg twice daily 30- 60  minutes prior to  meal, # 60 with 11 refills --Follow up in 1 year,  sooner if needed. --Surveillance colonoscopy due 2028  HPI   Chief complaint : Here for annual GERD follow-up  Doing well on BID PPI . Cannot miss a dose or will get chest tightness. No dysphagia. Has chronic constipation managed well with miralax 2-3 times a day.   Patient has not had an EGD but did undergo a barium swallow March 2017 which showed no esophageal strictures, normal esophageal motility , + small hiatal hernia and episodes of spontaneous GE reflux.    GI History / Pertinent GI Studies   **All endoscopic studies may not be included here    Surveillance colonoscopy Sept 2023 --Three 3-5 mm polyps in transverse colon removed. Diverticulosis of transverse colon and left colon. Internal hemorrhoids.  Surgical [P], colon, transverse, polyp (3) - TUBULAR ADENOMAS (2), NEGATIVE FOR HIGH-GRADE DYSPLASIA. - POLYPOID PORTION OF NON-ADENOMATOUS COLONIC MUCOSA WITH ACTIVE INFLAMMATION AND CRYPT ARCHITECTURAL DISTORTION, POSSIBLY EARLY RETENTION (JUVENILE) POLYP.  Barium swallow  2017 Initial barium swallows demonstrate normal pharyngeal motion with swallowing. No laryngeal penetration or aspiration. No upper esophageal webs, strictures or diverticuli.  Normal esophageal motility. No intrinsic or extrinsic lesions of the esophagus are identified. There is a small sliding-type hiatal hernia and episodes of spontaneous GE reflux.  A 13 mm barium pill passed into the stomach without difficulty.  IMPRESSION: 1. Small sliding-type hiatal hernia and GE reflux. 2. No esophageal mass or stricture. 3. Normal esophageal motility.     Latest Ref Rng & Units 12/29/2022    7:35 PM 09/23/2022    5:38 AM 09/22/2022   10:14 AM  Hepatic Function  Total Protein 6.5 - 8.1 g/dL 7.2  5.8  7.0   Albumin 3.5 - 5.0 g/dL 4.0  3.4  3.7   AST 15 - 41 U/L 16  19  28    ALT 0 - 44 U/L 19  25  29    Alk Phosphatase 38 - 126 U/L 70  45  55   Total Bilirubin 0.3 - 1.2 mg/dL 0.7  0.6  0.8        Latest Ref Rng & Units 12/29/2022    7:35 PM 09/25/2022    5:28 AM 09/24/2022    6:50 AM  CBC  WBC 4.0 - 10.5 K/uL 10.5  5.2  5.1   Hemoglobin 12.0 - 15.0 g/dL 86.6  89.0  89.3   Hematocrit 36.0 - 46.0 % 40.5  33.6  32.7   Platelets 150 - 400 K/uL 293  222  205  Past Medical History:  Diagnosis Date   Anal fissure    saw Dr. Vicenta Dutch    Anxiety    on meds   Arthritis    on meds   Asthma    uses inhaler if needed   Carpal tunnel syndrome    Depression    on meds   Diabetes mellitus    on meds   GERD (gastroesophageal reflux disease)    on meds   History of ovarian cancer in adulthood    Hyperlipidemia    on meds   Hypertension    on meds   Ovarian cancer (HCC) 1986   1996   Skin cancer (melanoma) (HCC) 1997   Sleep apnea    uses CPAP    Tubular adenoma of colon 01/2014    Past Surgical History:  Procedure Laterality Date   ABDOMINAL HYSTERECTOMY  06/30/1984   BSO   BASAL CELL CARCINOMA EXCISION  10/09/2012   lower lip per Dr. Anniece    COLONOSCOPY   03/23/2019   per Dr. Leigh, adenomatous polyps, repeat in 3 yrs   GLBS tumor ovary 1986     HEMORRHOID SURGERY     HIP FRACTURE SURGERY Right 2007   MELANOMA EXCISION  07/01/1995   upper chest     Family History  Problem Relation Age of Onset   Dementia Mother    Asthma Mother    Multiple sclerosis Father    Uterine cancer Sister 60   Diabetes Brother    Diabetes Brother    Basal cell carcinoma Brother        lower lip   Diabetes Maternal Grandfather    Colon polyps Paternal Grandmother    Stomach cancer Paternal Grandmother        dx 54s-80s   Colon polyps Paternal Grandfather    Arthritis Other    Prostate cancer Other        grandfather per pt but she is unaware of which grandfather   Coronary artery disease Other    Pancreatic cancer Neg Hx    Esophageal cancer Neg Hx    Rectal cancer Neg Hx     Current Medications, Allergies, Family History and Social History were reviewed in [employer] electronic medical record.     Current Outpatient Medications  Medication Sig Dispense Refill   albuterol  (VENTOLIN  HFA) 108 (90 Base) MCG/ACT inhaler Inhale 2 puffs into the lungs every 6 (six) hours as needed for wheezing or shortness of breath. 8 g 6   ALPRAZolam  (XANAX ) 1 MG tablet Take 0.5 tablet twice a day as needed for anxiety, then take whole tablet at bedtime     atorvastatin  (LIPITOR ) 80 MG tablet Take 1 tablet (80 mg total) by mouth daily. 90 tablet 1   glimepiride  (AMARYL ) 1 MG tablet Take 1 tablet (1 mg total) by mouth daily with breakfast. 90 tablet 3   losartan -hydrochlorothiazide  (HYZAAR) 50-12.5 MG tablet Take 1 tablet by mouth daily. 90 tablet 1   metFORMIN  (GLUCOPHAGE -XR) 500 MG 24 hr tablet Take 1 tablet (500 mg total) by mouth daily with breakfast. 90 tablet 3   metoprolol  tartrate (LOPRESSOR ) 50 MG tablet Take 1 tablet (50 mg total) by mouth 2 (two) times daily. 180 tablet 1   nortriptyline  (PAMELOR ) 10 MG capsule Take 1 capsule (10 mg total) by mouth  at bedtime. 90 capsule 3   omeprazole  (PRILOSEC) 40 MG capsule Take 1 capsule (40 mg total) by mouth 2 (two) times daily. 180 capsule 0  Semaglutide  (RYBELSUS ) 14 MG TABS Take 1 tablet (14 mg total) by mouth daily. 30 tablet 1   venlafaxine  XR (EFFEXOR -XR) 150 MG 24 hr capsule Take 1 capsule (150 mg total) by mouth daily. 90 capsule 1   No current facility-administered medications for this visit.    Review of Systems: No chest pain. No shortness of breath. No urinary complaints.    Physical Exam  Filed Weights   07/08/23 1011  Weight: 228 lb 8 oz (103.6 kg)   Wt Readings from Last 3 Encounters:  07/08/23 228 lb 8 oz (103.6 kg)  06/15/23 227 lb (103 kg)  05/26/23 219 lb (99.3 kg)    BP 120/72 (BP Location: Left Arm, Patient Position: Sitting, Cuff Size: Large)   Pulse 72   Ht 5' 4 (1.626 m)   Wt 228 lb 8 oz (103.6 kg)   LMP 03/22/1986   BMI 39.22 kg/m  Constitutional:  Pleasant, generally well appearing female in no acute distress. Psychiatric: Normal mood and affect. Behavior is normal. EENT: Pupils normal.  Conjunctivae are normal. No scleral icterus. Neck supple.  Cardiovascular: Normal rate, regular rhythm.  Pulmonary/chest: Effort normal and breath sounds normal. No wheezing, rales or rhonchi. Abdominal: Soft, nondistended, nontender. Bowel sounds active throughout. There are no masses palpable. No hepatomegaly. Neurological: Alert and oriented to person place and time.    Vina Dasen, NP  07/08/2023, 10:23 AM

## 2023-07-09 ENCOUNTER — Telehealth: Payer: Self-pay | Admitting: *Deleted

## 2023-07-09 ENCOUNTER — Encounter: Payer: Self-pay | Admitting: Gastroenterology

## 2023-07-09 NOTE — Telephone Encounter (Addendum)
 Called patient to schedule an appt for the EGD. Patient states she wants to call the insurance company to see if they will pay for the procedure, and she will call back today or tomorrow for scheduling if possible.

## 2023-07-09 NOTE — Progress Notes (Signed)
 Agree with assessment with the following suggestions:  Given her age, duration of reflux, ethnicity, BMI, she has risk factors for Barrett's esophagus and meets criteria for screening..  If she is requiring high-dose PPI over time, I think an EGD is reasonable to screen for Barrett's if she is willing.  Morgan [name] can you check and see if she would be willing to do an EGD at some point?  Thanks

## 2023-07-09 NOTE — Telephone Encounter (Signed)
-----   Message from Vina Dasen sent at 07/09/2023  2:03 PM EST ----- Morgan [name],  Dr. Leigh would like Namya to have a one-time EGD for Barrett's esophagus screening.  I called and spoke with her and she is agreeable.  Would you please arrange for an EGD to be done at the [facility] with Dr. Leigh.  Diagnosis is chronic GERD.  This is not an urgent procedure.  Thanks

## 2023-07-10 ENCOUNTER — Encounter: Payer: Self-pay | Admitting: Gastroenterology

## 2023-07-10 DIAGNOSIS — G4733 Obstructive sleep apnea (adult) (pediatric): Secondary | ICD-10-CM | POA: Diagnosis not present

## 2023-07-11 ENCOUNTER — Encounter: Payer: Self-pay | Admitting: Behavioral Health

## 2023-07-13 ENCOUNTER — Encounter: Payer: Self-pay | Admitting: Behavioral Health

## 2023-07-13 ENCOUNTER — Ambulatory Visit: Payer: Self-pay | Admitting: Family Medicine

## 2023-07-13 ENCOUNTER — Ambulatory Visit (HOSPITAL_COMMUNITY)
Admission: EM | Admit: 2023-07-13 | Discharge: 2023-07-13 | Disposition: A | Discharge status: Other Acute Inpatient Hospital | Payer: Medicaid Other

## 2023-07-13 DIAGNOSIS — R45850 Homicidal ideations: Secondary | ICD-10-CM | POA: Insufficient documentation

## 2023-07-13 DIAGNOSIS — Z658 Other specified problems related to psychosocial circumstances: Secondary | ICD-10-CM | POA: Insufficient documentation

## 2023-07-13 DIAGNOSIS — F332 Major depressive disorder, recurrent severe without psychotic features: Secondary | ICD-10-CM | POA: Insufficient documentation

## 2023-07-13 DIAGNOSIS — F322 Major depressive disorder, single episode, severe without psychotic features: Secondary | ICD-10-CM | POA: Diagnosis present

## 2023-07-13 LAB — LIPID PANEL
Cholesterol: 158 mg/dL (ref 0–200)
HDL: 41 mg/dL (ref >40)
LDL Cholesterol: 57 mg/dL (ref 0–99)
Total CHOL/HDL Ratio: 3.9 {ratio}
Triglycerides: 298 mg/dL — ABNORMAL HIGH (ref <150)
VLDL: 60 mg/dL — ABNORMAL HIGH (ref 0–40)

## 2023-07-13 LAB — COMPREHENSIVE METABOLIC PANEL
ALT: 31 U/L (ref 0–44)
AST: 23 U/L (ref 15–41)
Albumin: 3.7 g/dL (ref 3.5–5.0)
Alkaline Phosphatase: 67 U/L (ref 38–126)
Anion gap: 13 (ref 5–15)
BUN: 13 mg/dL (ref 6–20)
CO2: 24 mmol/L (ref 22–32)
Calcium: 9.6 mg/dL (ref 8.9–10.3)
Chloride: 102 mmol/L (ref 98–111)
Creatinine, Ser: 0.74 mg/dL (ref 0.44–1.00)
GFR, Estimated: >60 mL/min (ref >60)
Glucose, Bld: 124 mg/dL — ABNORMAL HIGH (ref 70–99)
Potassium: 3.3 mmol/L — ABNORMAL LOW (ref 3.5–5.1)
Sodium: 139 mmol/L (ref 135–145)
Total Bilirubin: 0.6 mg/dL (ref 0.0–1.2)
Total Protein: 7.4 g/dL (ref 6.5–8.1)

## 2023-07-13 LAB — CBC WITH DIFFERENTIAL/PLATELET
Abs Immature Granulocytes: 0.2 10*3/uL — ABNORMAL HIGH (ref 0.00–0.07)
Basophils Absolute: 0 10*3/uL (ref 0.0–0.1)
Basophils Relative: 0 %
Eosinophils Absolute: 0 10*3/uL (ref 0.0–0.5)
Eosinophils Relative: 0 %
HCT: 42.9 % (ref 36.0–46.0)
Hemoglobin: 14.3 g/dL (ref 12.0–15.0)
Lymphocytes Relative: 28 %
Lymphs Abs: 2.7 10*3/uL (ref 0.7–4.0)
MCH: 30.5 pg (ref 26.0–34.0)
MCHC: 33.3 g/dL (ref 30.0–36.0)
MCV: 91.5 fL (ref 80.0–100.0)
Monocytes Absolute: 0.5 10*3/uL (ref 0.1–1.0)
Monocytes Relative: 5 %
Myelocytes: 2 %
Neutro Abs: 6.4 10*3/uL (ref 1.7–7.7)
Neutrophils Relative %: 65 %
Platelets: 271 10*3/uL (ref 150–400)
RBC: 4.69 MIL/uL (ref 3.87–5.11)
RDW: 14.5 % (ref 11.5–15.5)
WBC: 9.8 10*3/uL (ref 4.0–10.5)
nRBC: 0 % (ref 0.0–0.2)
nRBC: 0 /100{WBCs}

## 2023-07-13 LAB — HEMOGLOBIN A1C
Hgb A1c MFr Bld: 7.7 % — ABNORMAL HIGH (ref 4.8–5.6)
Mean Plasma Glucose: 174.29 mg/dL

## 2023-07-13 LAB — POCT URINE DRUG SCREEN - MANUAL ENTRY (I-SCREEN)
POC Amphetamine UR: NOT DETECTED
POC Amphetamine UR: NOT DETECTED
POC Buprenorphine (BUP): NOT DETECTED
POC Buprenorphine (BUP): NOT DETECTED
POC Cocaine UR: NOT DETECTED
POC Cocaine UR: NOT DETECTED
POC Marijuana UR: NOT DETECTED
POC Marijuana UR: NOT DETECTED
POC Methadone UR: NOT DETECTED
POC Methadone UR: NOT DETECTED
POC Methamphetamine UR: NOT DETECTED
POC Methamphetamine UR: NOT DETECTED
POC Morphine: NOT DETECTED
POC Morphine: NOT DETECTED
POC Oxazepam (BZO): POSITIVE — AB
POC Oxazepam (BZO): POSITIVE — AB
POC Oxycodone UR: NOT DETECTED
POC Oxycodone UR: NOT DETECTED
POC Secobarbital (BAR): NOT DETECTED
POC Secobarbital (BAR): NOT DETECTED

## 2023-07-13 LAB — URINALYSIS, ROUTINE W REFLEX MICROSCOPIC
Bilirubin Urine: NEGATIVE
Glucose, UA: NEGATIVE mg/dL
Hgb urine dipstick: NEGATIVE
Ketones, ur: NEGATIVE mg/dL
Leukocytes,Ua: NEGATIVE
Nitrite: NEGATIVE
Protein, ur: NEGATIVE mg/dL
Specific Gravity, Urine: 1.009 (ref 1.005–1.030)
pH: 5 (ref 5.0–8.0)

## 2023-07-13 LAB — TSH: TSH: 2.086 u[IU]/mL (ref 0.350–4.500)

## 2023-07-13 MED ORDER — HYDROXYZINE HCL 25 MG PO TABS: 25.0000 mg | ORAL_TABLET | Freq: Three times a day (TID) | ORAL | Status: DC | PRN

## 2023-07-13 MED ORDER — NORTRIPTYLINE HCL 10 MG PO CAPS: 10.0000 mg | ORAL_CAPSULE | Freq: Every day | ORAL | Status: DC

## 2023-07-13 MED ORDER — ACETAMINOPHEN 325 MG PO TABS: 650.0000 mg | ORAL_TABLET | Freq: Four times a day (QID) | ORAL | Status: DC | PRN

## 2023-07-13 MED ORDER — PANTOPRAZOLE SODIUM 20 MG PO TBEC
20.0000 mg | DELAYED_RELEASE_TABLET | Freq: Two times a day (BID) | ORAL | Status: DC
Start: 2023-07-13 — End: 2023-07-14

## 2023-07-13 MED ORDER — VENLAFAXINE HCL ER 150 MG PO CP24: 150.0000 mg | ORAL_CAPSULE | Freq: Every day | ORAL | Status: DC

## 2023-07-13 MED ORDER — ALPRAZOLAM 0.5 MG PO TABS: 0.5000 mg | ORAL_TABLET | Freq: Two times a day (BID) | ORAL | Status: DC | PRN

## 2023-07-13 MED ORDER — ALBUTEROL SULFATE HFA 108 (90 BASE) MCG/ACT IN AERS: 2.0000 | INHALATION_SPRAY | Freq: Four times a day (QID) | RESPIRATORY_TRACT | Status: DC | PRN

## 2023-07-13 MED ORDER — ALUM & MAG HYDROXIDE-SIMETH 200-200-20 MG/5ML PO SUSP
30.0000 mL | ORAL | Status: DC | PRN
Start: 2023-07-13 — End: 2023-07-14

## 2023-07-13 MED ORDER — NICOTINE 14 MG/24HR TD PT24: 14.0000 mg | MEDICATED_PATCH | Freq: Every day | TRANSDERMAL | Status: DC

## 2023-07-13 MED ORDER — LOSARTAN POTASSIUM 50 MG PO TABS
50.0000 mg | ORAL_TABLET | Freq: Every day | ORAL | Status: DC
Start: 2023-07-14 — End: 2023-07-14

## 2023-07-13 MED ORDER — MAGNESIUM HYDROXIDE 400 MG/5ML PO SUSP: 30.0000 mL | Freq: Every day | ORAL | Status: DC | PRN

## 2023-07-13 MED ORDER — ATORVASTATIN CALCIUM 40 MG PO TABS
80.0000 mg | ORAL_TABLET | Freq: Every day | ORAL | Status: DC
Start: 2023-07-14 — End: 2023-07-14

## 2023-07-13 MED ORDER — DIPHENHYDRAMINE HCL 50 MG PO CAPS: 50.0000 mg | ORAL_CAPSULE | Freq: Four times a day (QID) | ORAL | Status: DC | PRN

## 2023-07-13 MED ORDER — HYDROCHLOROTHIAZIDE 12.5 MG PO TABS: 12.5000 mg | ORAL_TABLET | Freq: Every day | ORAL | Status: DC

## 2023-07-13 MED ORDER — GLIMEPIRIDE 2 MG PO TABS
1.0000 mg | ORAL_TABLET | Freq: Every day | ORAL | Status: DC
Start: 2023-07-14 — End: 2023-07-14

## 2023-07-13 NOTE — Discharge Instructions (Addendum)
 Transfer patient to [facility] for inpatient psychiatric admission Dr. Marlou Porch is the accepting MD

## 2023-07-13 NOTE — Telephone Encounter (Signed)
 Copied from CRM [id number]. Topic: Clinical - Red Word Triage >> Jul 13, 2023 11:51 AM Joen NOVAK wrote: [facility] that prompted transfer to Nurse Triage: NERVOUS BREAK DOWN. LOST HER JOB. PATIENTS NO LONGER WANT TO SEE HER AND HER LEASE IS ABOUT END.SHE THREATEN TO SHOOT HER BOSS ON PREVIOUS DUE TO THREAT WAS SENT TO BE BE[facility]AL [facility]. PATIENT IS SUFFERING FROM MENTAL HEALTH ISSUES   Chief Complaint: Depression and Anxiety Symptoms: homicidal ideations Frequency: constant Pertinent Negatives: Patient denies suicidal ideations Disposition: [x] ED /[] Urgent Care (no appt availability in office) / [] Appointment(In office/virtual)/ []  [place] [facility]/ [] Home Care/ [] Refused Recommended Disposition /[] [place] [facility]/ []  Follow-up with PCP Additional Notes: Patient stating that she recently lost her job and has been having trouble keeping a job. States that she doesn't know how she will afford her rent now. She is crying and yelling with RN on the phone, stating that if I had a gun I would go shoot them in the head. 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  Pt made verbal contract with this RN that she would not herself or anyone else. Patient agrees. Patient states that she is now on the side of road at a [employer] station near [place] smoking a pack of cigarettes although she does not smoke. Patient states that she has threatened to unalive her last boss and was grateful that he did not press charges, but today the urge is back and more intense.  Pt states that she texted her counselor, Lorrene Hasten , for support. Has not rec'd a response.  This RN placed call to EMS, stayed on the phone until EMS arrived. EMS could be heard on patients line. This RN discontinued call.   Reason for Disposition  Violent behavior, or threatening to physically  hurt or kill someone  Answer Assessment - Initial Assessment Questions .  Answer Assessment - Initial Assessment Questions 1. CONCERN: What happened that made you call today?     Lost job, scared to not afford rent  2. DEPRESSION SYMPTOM SCREENING: How are you feeling overall? (e.g., decreased energy, increased sleeping or difficulty sleeping, difficulty concentrating, feelings of sadness, guilt, hopelessness, or worthlessness)     Depressed, hopeless, worthless  3. RISK OF HARM - SUICIDAL IDEATION:  Do you ever have thoughts of hurting or killing yourself?  (e.g., yes, no, no but preoccupation with thoughts about death)   - INTENT:  Do you have thoughts of hurting or killing yourself right NOW? (e.g., yes, no, N/A)   - PLAN: Do you have a specific plan for how you would do this? (e.g., gun, knife, overdose, no plan, N/A)     No thoughts of hurting herself right now   4. RISK OF HARM - HOMICIDAL IDEATION:  Do you ever have thoughts of hurting or killing someone else?  (e.g., yes, no, no but preoccupation with thoughts about death)   - INTENT:  Do you have thoughts of hurting or killing someone right NOW? (e.g., yes, no, N/A)   - PLAN: Do you have a specific plan for how you would do this? 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 Also wishes I had a gun and would go shoot them all up  5. FUNCTIONAL IMPAIRMENT: How have things been going for you overall? Have you had more difficulty than usual doing your normal daily activities?  (e.g., better, same, worse; self-care, school, work, interactions)     Same  6. SUPPORT: Who is with you now? Who do you live with? Do you have family or friends who you can talk to?      Patient is alone  7. THERAPIST: Do you have a counselor or therapist? Name?     Lorrene Coy, counselor.  8. STRESSORS: Has there been any new stress or recent changes in your  life?     Recently lost job  9. ALCOHOL USE OR SUBSTANCE USE (DRUG USE): Do you drink alcohol or use any illegal drugs?     No alcohol or illegal drug use  10. OTHER: Do you have any other physical symptoms right now? (e.g., fever)       No physical symptoms  11. PREGNANCY: Is there any chance you are pregnant? When was your last menstrual period?       N/A  Protocols used: Anxiety and Panic Attack-A-AH, Depression-A-AH

## 2023-07-13 NOTE — Progress Notes (Signed)
   07/13/23 1317  [facility] Triage Screening (Walk-ins at [facility] only)  How Did You Hear About Us ? Self  What Is the Reason for Your Visit/Call Today? Morgan [name] is a 58 year old female presenting to [facility] unaccompanied. Pt reports she recently got another job since Sep. Pt reports that her current job  does not want her either. Pt reports this happened today. 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 Pt appears to be agitated during triage because she has lost her job once again. 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 Pt reports she will not try to hurt these patients because she is not allowed on the property. Pt states, I cannot handle the rejection. Pt states, A month ago I thought about putting poison down my patients throat to try to kill him. Pt is actively taking medication as prescribed. Pt is currently seeing a therapist every other week. Pt denies substance use, Si, and Avh.  How Long Has This Been Causing You Problems? <Week  Have You Recently Had Any Thoughts About Hurting Yourself? No  Are You Planning to Commit Suicide/Harm Yourself At This time? No  Have you Recently Had Thoughts About Hurting Someone [name]? Yes  How long ago did you have thoughts of harming others? today  Are You Planning To Harm Someone At This Time? Yes  Explanation: I wish I could hurt these patients that got me fired  Physical Abuse Denies  Verbal Abuse Denies  Sexual Abuse Denies  Self-Neglect Denies  Possible abuse reported to: Other (Comment)  Are you currently experiencing any auditory, visual or other hallucinations? No  Have You Used Any Alcohol or Drugs in the Past 24 Hours? No  Do you have any current medical co-morbidities that require immediate attention? No  Clinician description of patient physical appearance/behavior: agitated, angry, tearful  What Do You Feel Would Help You the Most Today? Medication(s)  If access to [facility] was not available, would you have sought care in the Emergency Department? No  Determination of Need Urgent (48 hours)  Options For Referral Intensive Outpatient Therapy;Medication Management  Determination of Need filed? Yes

## 2023-07-13 NOTE — Progress Notes (Addendum)
 Pt was accepted to [facility] TODAY 07/13/2023 Bed Assignment 28 PENDING IVC uploaded to chart or faxed to [facility] Number [phone]  Address: [address] [place], [place], [place] [address]  DX:MDD, recurrent severe   Pt meets inpatient criteria per Dr.James Morene Lavone Bash, MD   Attending Physician will be Dr. Charlie Cam HAS  Report can be called to: [phone]  Pt can arrive after: [facility] RN to coordinate with care team.  Care Team notified: Day [facility] Cherylynn Ernst, RN, Night [facility] [place], Carolyn [place], Amy [place], [place], Ryta Ibrahim,RN, Jerrell Mitchell,DO, Anthony Reeves  Mitzie GEANNIE Pinal, MSW, [facility] 07/13/2023 5:53 PM
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
## 2023-07-13 NOTE — BH Assessment (Signed)
 Comprehensive Clinical Assessment (CCA) Note  07/13/2023 Morgan [name] [id number]  DISPOSITION: Per Dr. Delsie, pt is recommended for observation at [facility]  The patient demonstrates the following risk factors for suicide: Chronic risk factors for suicide include: psychiatric disorder of MDD . Acute risk factors for suicide include: loss (financial, interpersonal, professional). Protective factors for this patient include: hope for the future. Considering these factors, the overall suicide risk at this point appears to be low. Patient is appropriate for outpatient follow up.   Per Triage assessment: "Morgan [name] is a 58 year old female presenting to [facility] unaccompanied. Pt reports she recently got another job since Sep. Pt reports that her current job  does not want her either. Pt reports this happened today. 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 Pt appears to be agitated during triage because she has lost her job once again. 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 Pt reports she will not try to hurt these patients because she is not allowed on the property. Pt states, I cannot handle the rejection. Pt states, A month ago I thought about putting poison down my patients throat to try to kill him. Pt is actively taking medication as prescribed. Pt is currently seeing a therapist every other week. Pt denies substance use, Si, and Avh."   Chief Complaint:  Chief Complaint  Patient presents with   Agitation   Visit Diagnosis:  MDD, Recurrent, Severe    CCA Screening, Triage and Referral (STR)  Patient Reported Information How did you hear about us ? Self  What Is the Reason for Your Visit/Call Today? Morgan [name] is a 58 year old female presenting to [facility] unaccompanied. Pt reports she recently got another job since Sep. Pt reports that her current job  does not want her either. Pt reports this happened  today. 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 Pt appears to be agitated during triage because she has lost her job once again. 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 Pt reports she will not try to hurt these patients because she is not allowed on the property. Pt states, I cannot handle the rejection. Pt states, A month ago I thought about putting poison down my patients throat to try to kill him. Pt is actively taking medication as prescribed. Pt is currently seeing a therapist every other week. Pt denies substance use, Si, and Avh.  How Long Has This Been Causing You Problems? <Week  What Do You Feel Would Help You the Most Today? Medication(s)   Have You Recently Had Any Thoughts About Hurting Yourself? No  Are You Planning to Commit Suicide/Harm Yourself At This time? No   Flowsheet Row ED from 07/13/2023 in [facility] Counselor from 02/24/2023 in [facility] Outpatient Behavioral Health at [facility] Admission (Discharged) from 12/29/2022 in [facility] INPATIENT BEHAVIORAL MEDICINE  C-SSRS RISK CATEGORY No Risk High Risk Low Risk       Have you Recently Had Thoughts About Hurting Someone Morgan [name]? Yes  Are You Planning to Harm Someone at This Time? 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 No  What Did You Use and How Much? 6 sips of liquor  Do You Currently Have a Therapist/Psychiatrist? Yes  Name of Therapist/Psychiatrist: Name of Therapist/Psychiatrist: Medication management by Garnette Olmsted MD; OP therapy with Lorrene Hasten [facility] (new therapist)   Have You Been Recently Discharged From Any Office Practice or Programs? No  Explanation of Discharge From Practice/Program: na     CCA Screening Triage Referral Assessment Type of Contact: Face-to-Face  Telemedicine  Service Delivery:   Is this Initial or Reassessment?   Date Telepsych consult ordered in [facility]:    Time Telepsych consult ordered in [facility]:    Location of Assessment: [facility]  Provider Location: [facility] Assessment Services   Collateral Involvement: None   Does Patient Have a Automotive Engineer Guardian? No  Legal Guardian Contact Information: na  Copy of Legal Guardianship Form: No - copy requested  Legal Guardian Notified of Arrival: -- (na)  Legal Guardian Notified of Pending Discharge: -- (na)  If Minor and Not Living with Parent(s), Who has Custody? adult  Is CPS involved or ever been involved? -- (none reported)  Is APS involved or ever been involved? -- (none reported)   Patient Determined To Be At Risk for Harm To Self or Others Based on Review of Patient Reported Information or Presenting Complaint? Yes, for Harm to Others  Method: No Plan (identified person (s))  Availability of Means: Has close by  Intent: Vague intent or NA  Notification Required: No need or identified person  Additional Information for Danger to Others Potential: -- (na)  Additional Comments for Danger to Others Potential: N/A  Are There Guns or Other Weapons in Your Home? No  Types of Guns/Weapons: N/A  Are These Weapons Safely Secured?                            -- (na)  Who Could Verify You Are Able To Have These Secured: N/A  Do You Have any Outstanding Charges, Pending Court Dates, Parole/Probation? denies  Contacted To Inform of Risk of Harm To Self or Others: -- (na)    Does Patient Present under Involuntary Commitment? No    [place] of Residence: [place]   Patient Currently Receiving the Following Services: Medication Management; Individual Therapy   Determination of Need: Urgent (48 hours) (Per Dr. Delsie, pt is recommended for observation at [facility])   Options For Referral: [facility] Urgent Care (OBS unit)     CCA Biopsychosocial Patient Reported  Schizophrenia/Schizoaffective Diagnosis in Past: No   Strengths: When asked patient can't identify personal strengths   Mental Health Symptoms Depression:  Change in energy/activity; Difficulty Concentrating; Fatigue; Hopelessness; Irritability; Tearfulness; Worthlessness (Loves to eat stress eating)   Duration of Depressive symptoms: Duration of Depressive Symptoms: Greater than two weeks   Mania:  None   Anxiety:   Worrying; Difficulty concentrating; Fatigue; Irritability; Restlessness; Tension (Doctor says why she takes the 1 pill to help her mind settle down)   Psychosis:  None (none reported)   Duration of Psychotic symptoms:    Trauma:  None   Obsessions:  None   Compulsions:  None   Inattention:  None   Hyperactivity/Impulsivity:  None   Oppositional/Defiant Behaviors:  None   Emotional Irregularity:  Mood lability; Potentially harmful impulsivity   Other Mood/Personality Symptoms:  N/A    Mental Status Exam Appearance and self-care  Stature:  Average   Weight:  Overweight   Clothing:  Casual   Grooming:  Normal   Cosmetic use:  Age  appropriate   Posture/gait:  Normal   Motor activity:  Not Remarkable   Sensorium  Attention:  Normal   Concentration:  Normal   Orientation:  X5   Recall/memory:  Normal   Affect and Mood  Affect:  Depressed; Tearful; Labile (Frustrated with life)   Mood:  Angry; Anxious; Depressed; Hopeless; Irritable; Worthless   Relating  Eye contact:  Normal   Facial expression:  Sad   Attitude toward examiner:  Cooperative   Thought and Language  Speech flow: Normal   Thought content:  Appropriate to Mood and Circumstances   Preoccupation:  None   Hallucinations:  None   Organization:  Engineer, Site of Knowledge:  Average   Intelligence:  Average   Abstraction:  Normal   Judgement:  Fair   Dance Movement Psychotherapist:  Realistic   Insight:  Fair   Decision Making:  Location Manager  (Patient says goes back and forth waver a lot)   Social Functioning  Social Maturity:  Responsible   Social Judgement:  Normal   Stress  Stressors:  Surveyor, Quantity; Work; Veterinary Surgeon (life)   Coping Ability:  Overwhelmed; Exhausted   Skill Deficits:  -- (getting life back together)   Supports:  Church; Family; Friends/Service system (lives by herself in [place])     Religion: Religion/Spirituality Are You A Religious Person?: Yes What is Your Religious Affiliation?: Christian How Might This Affect Treatment?: N/A  Leisure/Recreation: Leisure / Recreation Do You Have Hobbies?: No  Exercise/Diet: Exercise/Diet Do You Exercise?: No Have You Gained or Lost A Significant Amount of Weight in the Past Six Months?: Yes-Lost Number of Pounds Lost?: 20 Do You Follow a Special Diet?: Yes Type of Diet: Patient is diabetic type II has a diet to fall not always good with it Do You Have Any Trouble Sleeping?: No   CCA Employment/Education Employment/Work Situation: Employment / Work Situation Employment Situation: Unemployed (Recently lost her job and is angry about it) Patient's Job has Been Impacted by Current Illness: No Has Patient ever Been in the [employer]?: No  Education: Education Is Patient Currently Attending School?: No Last Grade Completed: 16 Did You Attend College?: Yes What Type of College Degree Do you Have?: Bachelors in theology. 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 12th grade reading comprehension issues.) Were Any Medications Ever Prescribed For These Difficulties?: No Patient's Education Has Been Impacted by Current Illness: No   CCA Family/Childhood History Family and Relationship History: Family history Marital status: Single Does patient have children?: No  Childhood History:  Childhood History By whom was/is the patient raised?: Both parents Did  patient suffer any verbal/emotional/physical/sexual abuse as a child?: No Has patient ever been sexually abused/assaulted/raped as an adolescent or adult?: No Witnessed domestic violence?: No Has patient been affected by domestic violence as an adult?: No       CCA Substance Use Alcohol/Drug Use: Alcohol / Drug Use Pain Medications: see MAR Prescriptions: See MAR Over the Counter: See MAR History of alcohol / drug use?: No history of alcohol / drug abuse Longest period of sobriety (when/how long): N/A Negative Consequences of Use:  (N/A) Withdrawal Symptoms:  (N/A)                         ASAM's:  Six Dimensions of Multidimensional Assessment  Dimension 1:  Acute Intoxication and/or Withdrawal Potential:  Dimension 2:  Biomedical Conditions and Complications:      Dimension 3:  Emotional, Behavioral, or Cognitive Conditions and Complications:     Dimension 4:  Readiness to Change:     Dimension 5:  Relapse, Continued use, or Continued Problem Potential:     Dimension 6:  Recovery/Living Environment:     ASAM Severity Score:    ASAM Recommended Level of Treatment:     Substance use Disorder (SUD)    Recommendations for Services/Supports/Treatments: Recommendations for Services/Supports/Treatments Recommendations For Services/Supports/Treatments: Individual Therapy, Medication Management  Disposition Recommendation per psychiatric provider: We recommend transfer to [facility].   DSM5 Diagnoses: Patient Active Problem List   Diagnosis Date Noted   Insomnia 05/04/2023   Genetic testing 01/02/2023   MDD (major depressive disorder), recurrent episode, severe (HCC) 12/29/2022   MDD (major depressive disorder), severe (HCC) 12/29/2022   History of ovarian cancer in adulthood 12/15/2022   Campylobacter diarrhea 09/24/2022   Hypokalemia 09/22/2022   Actinic skin damage 09/22/2022   Acute gastroenteritis 09/22/2022   Carpal  tunnel syndrome, left upper limb 08/26/2021   Carpal tunnel syndrome, right upper limb 08/26/2021   Carpal tunnel syndrome on both sides 08/02/2021   Asthma 01/09/2021   Vitamin B12 deficiency 09/05/2020   Dyslipidemia 09/04/2020   Neuropathy 09/04/2020   Mixed dyslipidemia 09/04/2020   COVID-19 virus infection 06/19/2020   Type 2 diabetes mellitus with diabetic polyneuropathy, without long-term current use of insulin  (HCC) 08/25/2019   Type 2 diabetes mellitus with hyperglycemia, without long-term current use of insulin  (HCC) 02/17/2019   Anxiety and depression 02/17/2019   BMI 45.0-49.9, adult (HCC) 02/04/2019   Ringing in ears, bilateral 12/29/2017   OSA on CPAP 01/06/2017   Witnessed episode of apnea 07/03/2016   Diverticulitis of colon 11/30/2014   IBS (irritable bowel syndrome) 04/21/2013   Chronic cough 11/26/2011   External hemorrhoids 04/04/2010   UNSPECIFIED THROMBOSED HEMORRHOIDS 01/08/2009   GERD 08/23/2008   Diabetes mellitus without complication (HCC) 01/22/2007   Hyperlipidemia 01/22/2007   Essential hypertension 01/22/2007     Referrals to Alternative Service(s): Referred to Alternative Service(s):   Place:   Date:   Time:    Referred to Alternative Service(s):   Place:   Date:   Time:    Referred to Alternative Service(s):   Place:   Date:   Time:    Referred to Alternative Service(s):   Place:   Date:   Time:     Neyra Pettie T, Counselor

## 2023-07-13 NOTE — ED Provider Notes (Signed)
 Notified by staff that involuntary commitment was declined due to wrong [place] being on paperwork.  IVC will need to be redone.  Patient has a pending bed at [facility] and will need to be transported via law enforcement if under involuntary commitment.  Met with patient briefly.  Patient continues to endorse homicidal ideations with a plan which she will not disclose that this clinical research associate towards her former client.  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  After she left that client called her boss and told her that he no longer wanted patient to come to their home.  When her boss called her and told her this she became very angry and her boss told her that she needed to find a job somewhere else. She blames her former client for the loss of her job.  States if she is discharged at this time she will definitely go and confront this client and possibly follow through with her undisclosed plan to hurt him.  IVC resubmitted and was approved.

## 2023-07-13 NOTE — Progress Notes (Signed)
   07/13/23 1317  [facility] Triage Screening (Walk-ins at [facility] only)  How Did You Hear About Us ? Self  What Is the Reason for Your Visit/Call Today? Morgan [name] is a 58 year old female presenting to [facility] unaccompanied. Pt reports she recently got another job since Sep. Pt reports that her current job  does not want her either. Pt reports this happened today. 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 Pt appears to be agitated during triage because she has lost her job once again. 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 Pt reports she will not try to hurt these patients because she is not allowed on the property. Pt states, I cannot handle the rejection. Pt states, A month ago I thought about putting poison down my patients throat to try to kill him. Pt is actively taking medication as prescribed. Pt is currently seeing a therapist every other week. Pt denies substance use, Si, and Avh.  How Long Has This Been Causing You Problems? <Week  Have You Recently Had Any Thoughts About Hurting Yourself? No  Are You Planning to Commit Suicide/Harm Yourself At This time? No  Have you Recently Had Thoughts About Hurting Someone [name]? Yes  How long ago did you have thoughts of harming others? today  Are You Planning To Harm Someone At This Time? Yes  Explanation: I wish I could hurt these patients that got me fired  Physical Abuse Denies  Verbal Abuse Denies  Sexual Abuse Denies  Self-Neglect Denies  Possible abuse reported to: Other (Comment)  Are you currently experiencing any auditory, visual or other hallucinations? No  Have You Used Any Alcohol or Drugs in the Past 24 Hours? No  Do you have any current medical co-morbidities that require immediate attention? No  Clinician description of patient physical appearance/behavior: agitated, angry, tearful  What Do You Feel Would Help You the Most Today? Medication(s)  If access to  [facility] Urgent [facility] was not available, would you have sought care in the Emergency Department? No  Determination of Need Urgent (48 hours)  Options For Referral Intensive Outpatient Therapy;Medication Management  Determination of Need filed? Yes

## 2023-07-13 NOTE — Telephone Encounter (Signed)
FYI Pt is currently at the ED 

## 2023-07-14 ENCOUNTER — Inpatient Hospital Stay
Admission: AD | Admit: 2023-07-14 | Discharge: 2023-07-20 | DRG: 885 | Disposition: A | Discharge status: Home / Self Care | Payer: Medicaid Other | Source: Intra-hospital | Attending: Psychiatry | Admitting: Psychiatry

## 2023-07-14 ENCOUNTER — Other Ambulatory Visit: Payer: Self-pay

## 2023-07-14 ENCOUNTER — Telehealth: Payer: Self-pay | Admitting: Behavioral Health

## 2023-07-14 ENCOUNTER — Encounter: Payer: Self-pay | Admitting: Nurse Practitioner

## 2023-07-14 ENCOUNTER — Encounter (HOSPITAL_COMMUNITY): Payer: Self-pay | Admitting: Emergency Medicine

## 2023-07-14 ENCOUNTER — Emergency Department (HOSPITAL_COMMUNITY)
Admission: EM | Admit: 2023-07-14 | Discharge: 2023-07-14 | Disposition: A | Discharge status: Other Acute Inpatient Hospital | Payer: Medicaid Other | Attending: Psychiatry | Admitting: Psychiatry

## 2023-07-14 DIAGNOSIS — F332 Major depressive disorder, recurrent severe without psychotic features: Secondary | ICD-10-CM | POA: Diagnosis present

## 2023-07-14 DIAGNOSIS — Z8042 Family history of malignant neoplasm of prostate: Secondary | ICD-10-CM | POA: Diagnosis not present

## 2023-07-14 DIAGNOSIS — Z7984 Long term (current) use of oral hypoglycemic drugs: Secondary | ICD-10-CM

## 2023-07-14 DIAGNOSIS — R451 Restlessness and agitation: Secondary | ICD-10-CM | POA: Diagnosis not present

## 2023-07-14 DIAGNOSIS — Z808 Family history of malignant neoplasm of other organs or systems: Secondary | ICD-10-CM | POA: Diagnosis not present

## 2023-07-14 DIAGNOSIS — Z79899 Other long term (current) drug therapy: Secondary | ICD-10-CM | POA: Diagnosis not present

## 2023-07-14 DIAGNOSIS — Z1152 Encounter for screening for COVID-19: Secondary | ICD-10-CM | POA: Diagnosis not present

## 2023-07-14 DIAGNOSIS — I10 Essential (primary) hypertension: Secondary | ICD-10-CM | POA: Diagnosis present

## 2023-07-14 DIAGNOSIS — R45850 Homicidal ideations: Secondary | ICD-10-CM | POA: Diagnosis present

## 2023-07-14 DIAGNOSIS — F339 Major depressive disorder, recurrent, unspecified: Secondary | ICD-10-CM | POA: Diagnosis present

## 2023-07-14 DIAGNOSIS — F17210 Nicotine dependence, cigarettes, uncomplicated: Secondary | ICD-10-CM | POA: Diagnosis present

## 2023-07-14 DIAGNOSIS — F419 Anxiety disorder, unspecified: Secondary | ICD-10-CM | POA: Diagnosis present

## 2023-07-14 DIAGNOSIS — Z90710 Acquired absence of both cervix and uterus: Secondary | ICD-10-CM

## 2023-07-14 DIAGNOSIS — K219 Gastro-esophageal reflux disease without esophagitis: Secondary | ICD-10-CM | POA: Diagnosis present

## 2023-07-14 DIAGNOSIS — Z8261 Family history of arthritis: Secondary | ICD-10-CM | POA: Diagnosis not present

## 2023-07-14 DIAGNOSIS — Z825 Family history of asthma and other chronic lower respiratory diseases: Secondary | ICD-10-CM

## 2023-07-14 DIAGNOSIS — Z85828 Personal history of other malignant neoplasm of skin: Secondary | ICD-10-CM

## 2023-07-14 DIAGNOSIS — Z91410 Personal history of adult physical and sexual abuse: Secondary | ICD-10-CM | POA: Diagnosis not present

## 2023-07-14 DIAGNOSIS — E119 Type 2 diabetes mellitus without complications: Secondary | ICD-10-CM | POA: Diagnosis present

## 2023-07-14 DIAGNOSIS — E785 Hyperlipidemia, unspecified: Secondary | ICD-10-CM | POA: Diagnosis present

## 2023-07-14 DIAGNOSIS — Z818 Family history of other mental and behavioral disorders: Secondary | ICD-10-CM | POA: Diagnosis not present

## 2023-07-14 DIAGNOSIS — Z800 Family history of malignant neoplasm of digestive organs: Secondary | ICD-10-CM | POA: Diagnosis not present

## 2023-07-14 DIAGNOSIS — Z833 Family history of diabetes mellitus: Secondary | ICD-10-CM | POA: Diagnosis not present

## 2023-07-14 DIAGNOSIS — Z85820 Personal history of malignant melanoma of skin: Secondary | ICD-10-CM

## 2023-07-14 DIAGNOSIS — Z820 Family history of epilepsy and other diseases of the nervous system: Secondary | ICD-10-CM

## 2023-07-14 DIAGNOSIS — Z888 Allergy status to other drugs, medicaments and biological substances status: Secondary | ICD-10-CM

## 2023-07-14 DIAGNOSIS — Z8249 Family history of ischemic heart disease and other diseases of the circulatory system: Secondary | ICD-10-CM

## 2023-07-14 DIAGNOSIS — Z860101 Personal history of adenomatous and serrated colon polyps: Secondary | ICD-10-CM

## 2023-07-14 DIAGNOSIS — Z8543 Personal history of malignant neoplasm of ovary: Secondary | ICD-10-CM

## 2023-07-14 DIAGNOSIS — Z8049 Family history of malignant neoplasm of other genital organs: Secondary | ICD-10-CM | POA: Diagnosis not present

## 2023-07-14 LAB — CBG MONITORING, ED: Glucose-Capillary: 74 mg/dL (ref 70–99)

## 2023-07-14 LAB — CBC WITH DIFFERENTIAL/PLATELET
Abs Immature Granulocytes: 0.63 10*3/uL — ABNORMAL HIGH (ref 0.00–0.07)
Basophils Absolute: 0 10*3/uL (ref 0.0–0.1)
Basophils Relative: 0 %
Eosinophils Absolute: 0 10*3/uL (ref 0.0–0.5)
Eosinophils Relative: 0 %
HCT: 40 % (ref 36.0–46.0)
Hemoglobin: 13.1 g/dL (ref 12.0–15.0)
Immature Granulocytes: 6 %
Lymphocytes Relative: 27 %
Lymphs Abs: 2.7 10*3/uL (ref 0.7–4.0)
MCH: 30.2 pg (ref 26.0–34.0)
MCHC: 32.8 g/dL (ref 30.0–36.0)
MCV: 92.2 fL (ref 80.0–100.0)
Monocytes Absolute: 1.1 10*3/uL — ABNORMAL HIGH (ref 0.1–1.0)
Monocytes Relative: 11 %
Neutro Abs: 5.6 10*3/uL (ref 1.7–7.7)
Neutrophils Relative %: 56 %
Platelets: 274 10*3/uL (ref 150–400)
RBC: 4.34 MIL/uL (ref 3.87–5.11)
RDW: 14.5 % (ref 11.5–15.5)
WBC: 10.1 10*3/uL (ref 4.0–10.5)
nRBC: 0 % (ref 0.0–0.2)

## 2023-07-14 LAB — RAPID URINE DRUG SCREEN, HOSP PERFORMED
Amphetamines: NOT DETECTED
Barbiturates: NOT DETECTED
Benzodiazepines: POSITIVE — AB
Cocaine: NOT DETECTED
Opiates: NOT DETECTED
Tetrahydrocannabinol: NOT DETECTED

## 2023-07-14 LAB — ETHANOL: Alcohol, Ethyl (B): <10 mg/dL (ref <10)

## 2023-07-14 LAB — COMPREHENSIVE METABOLIC PANEL
ALT: 32 U/L (ref 0–44)
AST: 23 U/L (ref 15–41)
Albumin: 4 g/dL (ref 3.5–5.0)
Alkaline Phosphatase: 69 U/L (ref 38–126)
Anion gap: 11 (ref 5–15)
BUN: 17 mg/dL (ref 6–20)
CO2: 23 mmol/L (ref 22–32)
Calcium: 9.3 mg/dL (ref 8.9–10.3)
Chloride: 102 mmol/L (ref 98–111)
Creatinine, Ser: 0.72 mg/dL (ref 0.44–1.00)
GFR, Estimated: >60 mL/min (ref >60)
Glucose, Bld: 184 mg/dL — ABNORMAL HIGH (ref 70–99)
Potassium: 3.6 mmol/L (ref 3.5–5.1)
Sodium: 136 mmol/L (ref 135–145)
Total Bilirubin: 0.7 mg/dL (ref 0.0–1.2)
Total Protein: 7.8 g/dL (ref 6.5–8.1)

## 2023-07-14 LAB — RESP PANEL BY RT-PCR (RSV, FLU A&B, COVID)  RVPGX2
Influenza A by PCR: NEGATIVE
Influenza B by PCR: NEGATIVE
Resp Syncytial Virus by PCR: NEGATIVE
SARS Coronavirus 2 by RT PCR: NEGATIVE

## 2023-07-14 LAB — GLUCOSE, CAPILLARY: Glucose-Capillary: 219 mg/dL — ABNORMAL HIGH (ref 70–99)

## 2023-07-14 MED ORDER — HALOPERIDOL LACTATE 5 MG/ML IJ SOLN: 10.0000 mg | Freq: Three times a day (TID) | INTRAMUSCULAR | Status: DC | PRN

## 2023-07-14 MED ORDER — ACETAMINOPHEN 325 MG PO TABS: 650.0000 mg | ORAL_TABLET | Freq: Four times a day (QID) | ORAL | Status: DC | PRN

## 2023-07-14 MED ORDER — PANTOPRAZOLE SODIUM 20 MG PO TBEC
20.0000 mg | DELAYED_RELEASE_TABLET | Freq: Two times a day (BID) | ORAL | Status: DC
  Administered 2023-07-14 – 2023-07-20 (×12): 20 mg via ORAL
  Filled 2023-07-14 (×12): qty 1

## 2023-07-14 MED ORDER — HALOPERIDOL 5 MG PO TABS
5.0000 mg | ORAL_TABLET | Freq: Three times a day (TID) | ORAL | Status: DC | PRN
  Administered 2023-07-14: 5 mg via ORAL
  Filled 2023-07-14: qty 1

## 2023-07-14 MED ORDER — ALBUTEROL SULFATE (2.5 MG/3ML) 0.083% IN NEBU: 3.0000 mL | INHALATION_SOLUTION | Freq: Four times a day (QID) | RESPIRATORY_TRACT | Status: DC | PRN

## 2023-07-14 MED ORDER — ALUM & MAG HYDROXIDE-SIMETH 200-200-20 MG/5ML PO SUSP: 30.0000 mL | ORAL | Status: DC | PRN

## 2023-07-14 MED ORDER — GLIMEPIRIDE 1 MG PO TABS
1.0000 mg | ORAL_TABLET | Freq: Every day | ORAL | Status: DC
  Administered 2023-07-15 – 2023-07-20 (×6): 1 mg via ORAL
  Filled 2023-07-14 (×6): qty 1

## 2023-07-14 MED ORDER — HYDROXYZINE HCL 25 MG PO TABS
25.0000 mg | ORAL_TABLET | Freq: Three times a day (TID) | ORAL | Status: DC | PRN
  Administered 2023-07-14: 25 mg via ORAL
  Filled 2023-07-14: qty 1

## 2023-07-14 MED ORDER — DIPHENHYDRAMINE HCL 25 MG PO CAPS: 50.0000 mg | ORAL_CAPSULE | Freq: Three times a day (TID) | ORAL | Status: DC | PRN

## 2023-07-14 MED ORDER — ALPRAZOLAM 0.5 MG PO TABS
0.5000 mg | ORAL_TABLET | Freq: Two times a day (BID) | ORAL | Status: DC | PRN
  Administered 2023-07-14: 0.5 mg via ORAL
  Filled 2023-07-14: qty 1

## 2023-07-14 MED ORDER — HALOPERIDOL LACTATE 5 MG/ML IJ SOLN
5.0000 mg | Freq: Three times a day (TID) | INTRAMUSCULAR | Status: DC | PRN
Start: 2023-07-14 — End: 2023-07-20

## 2023-07-14 MED ORDER — LOSARTAN POTASSIUM 25 MG PO TABS
50.0000 mg | ORAL_TABLET | Freq: Every day | ORAL | Status: DC
  Filled 2023-07-14: qty 2
  Filled 2023-07-14: qty 1

## 2023-07-14 MED ORDER — LORAZEPAM 2 MG/ML IJ SOLN: 2.0000 mg | Freq: Three times a day (TID) | INTRAMUSCULAR | Status: DC | PRN

## 2023-07-14 MED ORDER — LOSARTAN POTASSIUM 25 MG PO TABS
50.0000 mg | ORAL_TABLET | Freq: Every day | ORAL | Status: DC
  Administered 2023-07-15 – 2023-07-20 (×6): 50 mg via ORAL
  Filled 2023-07-14 (×6): qty 2

## 2023-07-14 MED ORDER — NORTRIPTYLINE HCL 10 MG PO CAPS
10.0000 mg | ORAL_CAPSULE | Freq: Every day | ORAL | Status: DC
  Administered 2023-07-14: 10 mg via ORAL
  Filled 2023-07-14: qty 1

## 2023-07-14 MED ORDER — ALBUTEROL SULFATE HFA 108 (90 BASE) MCG/ACT IN AERS: 2.0000 | INHALATION_SPRAY | Freq: Four times a day (QID) | RESPIRATORY_TRACT | Status: DC | PRN

## 2023-07-14 MED ORDER — HYDROXYZINE HCL 25 MG PO TABS
25.0000 mg | ORAL_TABLET | Freq: Three times a day (TID) | ORAL | Status: DC | PRN
Start: 2023-07-14 — End: 2023-07-20
  Administered 2023-07-16 – 2023-07-19 (×2): 25 mg via ORAL
  Filled 2023-07-14 (×2): qty 1

## 2023-07-14 MED ORDER — HYDROCHLOROTHIAZIDE 12.5 MG PO TABS
12.5000 mg | ORAL_TABLET | Freq: Every day | ORAL | Status: DC
Start: 2023-07-14 — End: 2023-07-14
  Filled 2023-07-14: qty 1

## 2023-07-14 MED ORDER — NORTRIPTYLINE HCL 10 MG PO CAPS
10.0000 mg | ORAL_CAPSULE | Freq: Every day | ORAL | Status: DC
  Administered 2023-07-14 – 2023-07-19 (×6): 10 mg via ORAL
  Filled 2023-07-14 (×6): qty 1

## 2023-07-14 MED ORDER — HALOPERIDOL 5 MG PO TABS
5.0000 mg | ORAL_TABLET | Freq: Three times a day (TID) | ORAL | Status: DC | PRN
  Administered 2023-07-17: 5 mg via ORAL
  Filled 2023-07-14: qty 1

## 2023-07-14 MED ORDER — GLIMEPIRIDE 1 MG PO TABS
1.0000 mg | ORAL_TABLET | Freq: Every day | ORAL | Status: DC
  Administered 2023-07-14: 1 mg via ORAL
  Filled 2023-07-14 (×2): qty 1

## 2023-07-14 MED ORDER — DIPHENHYDRAMINE HCL 50 MG/ML IJ SOLN: 50.0000 mg | Freq: Three times a day (TID) | INTRAMUSCULAR | Status: DC | PRN

## 2023-07-14 MED ORDER — VENLAFAXINE HCL ER 75 MG PO CP24
150.0000 mg | ORAL_CAPSULE | Freq: Every day | ORAL | Status: DC
Start: 2023-07-14 — End: 2023-07-14
  Administered 2023-07-14: 150 mg via ORAL
  Filled 2023-07-14: qty 2

## 2023-07-14 MED ORDER — DIPHENHYDRAMINE HCL 25 MG PO CAPS: 50.0000 mg | ORAL_CAPSULE | Freq: Four times a day (QID) | ORAL | Status: DC | PRN

## 2023-07-14 MED ORDER — MAGNESIUM HYDROXIDE 400 MG/5ML PO SUSP
30.0000 mL | Freq: Every day | ORAL | Status: DC | PRN
  Administered 2023-07-19: 30 mL via ORAL
  Filled 2023-07-14: qty 30

## 2023-07-14 MED ORDER — NICOTINE 14 MG/24HR TD PT24
14.0000 mg | MEDICATED_PATCH | Freq: Every day | TRANSDERMAL | Status: DC
Start: 2023-07-15 — End: 2023-07-20
  Filled 2023-07-14 (×3): qty 1

## 2023-07-14 MED ORDER — ALPRAZOLAM 0.5 MG PO TABS
0.5000 mg | ORAL_TABLET | Freq: Two times a day (BID) | ORAL | Status: DC | PRN
  Administered 2023-07-15 – 2023-07-20 (×6): 0.5 mg via ORAL
  Filled 2023-07-14 (×7): qty 1

## 2023-07-14 MED ORDER — HALOPERIDOL LACTATE 5 MG/ML IJ SOLN: 5.0000 mg | Freq: Three times a day (TID) | INTRAMUSCULAR | Status: DC | PRN

## 2023-07-14 MED ORDER — NICOTINE 14 MG/24HR TD PT24
14.0000 mg | MEDICATED_PATCH | Freq: Every day | TRANSDERMAL | Status: DC
  Filled 2023-07-14: qty 1

## 2023-07-14 MED ORDER — HYDROCHLOROTHIAZIDE 12.5 MG PO TABS
12.5000 mg | ORAL_TABLET | Freq: Every day | ORAL | Status: DC
  Administered 2023-07-15 – 2023-07-20 (×6): 12.5 mg via ORAL
  Filled 2023-07-14 (×6): qty 1

## 2023-07-14 MED ORDER — MAGNESIUM HYDROXIDE 400 MG/5ML PO SUSP: 30.0000 mL | Freq: Every day | ORAL | Status: DC | PRN

## 2023-07-14 MED ORDER — ATORVASTATIN CALCIUM 40 MG PO TABS
80.0000 mg | ORAL_TABLET | Freq: Every day | ORAL | Status: DC
  Administered 2023-07-14: 80 mg via ORAL
  Filled 2023-07-14: qty 2

## 2023-07-14 MED ORDER — VENLAFAXINE HCL ER 75 MG PO CP24
150.0000 mg | ORAL_CAPSULE | Freq: Every day | ORAL | Status: DC
  Administered 2023-07-15 – 2023-07-20 (×6): 150 mg via ORAL
  Filled 2023-07-14 (×6): qty 2

## 2023-07-14 MED ORDER — ALUM & MAG HYDROXIDE-SIMETH 200-200-20 MG/5ML PO SUSP
30.0000 mL | ORAL | Status: DC | PRN
  Administered 2023-07-16: 30 mL via ORAL
  Filled 2023-07-14: qty 30

## 2023-07-14 MED ORDER — PANTOPRAZOLE SODIUM 20 MG PO TBEC
20.0000 mg | DELAYED_RELEASE_TABLET | Freq: Two times a day (BID) | ORAL | Status: DC
  Administered 2023-07-14: 20 mg via ORAL
  Filled 2023-07-14: qty 1

## 2023-07-14 MED ORDER — ATORVASTATIN CALCIUM 80 MG PO TABS
80.0000 mg | ORAL_TABLET | Freq: Every day | ORAL | Status: DC
  Administered 2023-07-15 – 2023-07-20 (×6): 80 mg via ORAL
  Filled 2023-07-14 (×8): qty 1

## 2023-07-14 NOTE — ED Notes (Signed)
 Pt belongings moved to nurse station by room 3. 1 white belongings bag and brown purse

## 2023-07-14 NOTE — ED Notes (Signed)
Pt walked to the bathroom. 

## 2023-07-14 NOTE — ED Notes (Signed)
 Writer made aware by Rene Kocher NT of pt in SAPPU with CPAP. Upon investigating CPAP was ordered by Dr Madilyn Hook and applied by RT earlier this morning. Pt is in process of being moved to RM 3 for continued care and address safety issues.

## 2023-07-14 NOTE — ED Provider Triage Note (Signed)
 Emergency Medicine Provider Triage Evaluation Note  Morgan [name] , a 58 y.o. female  was evaluated in triage.  Pt complains of homicidal ideation.  Patient is IVCd.  Was sent here from [facility].  Patient had initially arrived there for treatment.  States she just recently lost her job and this is a second time this happened in 8 months.  She mentioned to them that she had thoughts of killing her employer by poisoning them because she was very frustrated with the outcome.  Denies SI and hallucinations at this time.  Review of Systems  Positive: See above Negative: See above  Physical Exam  BP (!) 146/56 (BP Location: Left Arm)   Pulse 86   Temp 98.2 F (36.8 C) (Oral)   Resp 17   Wt 103.6 kg   LMP 03/22/1986   SpO2 98%   BMI 39.22 kg/m  Gen:   Awake, no distress   Resp:  Normal effort  MSK:   Moves extremities without difficulty  Other:    Medical Decision Making  Medically screening exam initiated at 1:00 AM.  Appropriate orders placed.  Morgan [name] was informed that the remainder of the evaluation will be completed by another provider, this initial triage assessment does not replace that evaluation, and the importance of remaining in the ED until their evaluation is complete.  Work up started   Morgan [name] POUR, PA-C 07/14/23 0102

## 2023-07-14 NOTE — ED Notes (Signed)
 Pt has been dressed into scrubs and wanded by security.

## 2023-07-14 NOTE — ED Notes (Signed)
 Patient has been sleeping since she came here from [facility] and took the PRN medications.

## 2023-07-14 NOTE — Progress Notes (Signed)
 Admission Note:  Morgan [name] was admitted under IVC from [facility] emergency room.  Patient states she had a nervous breakdown this morning.  Patient reports she was fired from 2 jobs since May of 2024.  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  Also endorses anxiety and depression.  Denies SI and AVH.  Denies pain.    Patient was cooperative with admission process.  Logical/coherent speech. No assistive devices.  No dentures, hearing aids or dentures.  Patient denies any falls within the  last 6 moths.  Denies consumption of alcohol.  Reports she only started smoking cigarettes in the past couple of weeks. No illicit drug use.  Patient states she has her own home and lives alone. Her support system consists of her sister, 3 best friends and her church community.   Patient oriented to unit.  Questions and concerns answered and addressed. Patient showered and joined Recreational therapy group.   Patient belongings will remained bags in storage room due to possible exposure to bed bugs.

## 2023-07-14 NOTE — Group Note (Signed)
 Date:  07/14/2023 Time:  10:52 PM  Group Topic/Focus:  Wrap-Up Group:   The focus of this group is to help patients review their daily goal of treatment and discuss progress on daily workbooks.    Participation Level:  Active  Participation Quality:  Appropriate  Affect:  Appropriate  Cognitive:  Alert  Insight: Appropriate  Engagement in Group:  Engaged  Modes of Intervention:  Discussion  Additional Comments:    Tommas CHRISTELLA Bunker 07/14/2023, 10:52 PM

## 2023-07-14 NOTE — Plan of Care (Signed)
  Problem: Education: Goal: Knowledge of the prescribed therapeutic regimen will improve Outcome: Not Progressing   Problem: Coping: Goal: Coping ability will improve Outcome: Not Progressing   Problem: Health Behavior/Discharge Planning: Goal: Compliance with therapeutic regimen will improve Outcome: Not Progressing     Patient is new to the unit

## 2023-07-14 NOTE — Tx Team (Signed)
 Initial Treatment Plan 07/14/2023 2:28 PM Rosaline Charlies Gutta FMW:[id number]    PATIENT STRESSORS: Financial difficulties   Occupational concerns     PATIENT STRENGTHS: Ability for insight  General fund of knowledge  Motivation for treatment/growth  Physical Health    PATIENT IDENTIFIED PROBLEMS: Loss of 2 jobs in the last year                     DISCHARGE CRITERIA:  Ability to meet basic life and health needs Improved stabilization in mood, thinking, and/or behavior Motivation to continue treatment in a less acute level of care Verbal commitment to aftercare and medication compliance  PRELIMINARY DISCHARGE PLAN: Return to previous living arrangement  PATIENT/FAMILY INVOLVEMENT: This treatment plan has been presented to and reviewed with the patient, Cherilynn Schomburg.  The patient and family have been given the opportunity to ask questions and make suggestions.  Harriette Greig DASEN, RN 07/14/2023, 2:28 PM

## 2023-07-14 NOTE — Telephone Encounter (Signed)
 The patient my chart message on January 13 that she had lost her job and asked that I call her.  I called on the 13 th after 5:00 and left a voicemail message.  I called again today after 5:00 around 5:20 PM I left a voicemail message promising to call her back tomorrow morning, 07/15/2023.

## 2023-07-14 NOTE — ED Provider Notes (Signed)
 [place] EMERGENCY DEPARTMENT AT [facility] Provider Note   CSN: [id number] Arrival date & time: 07/14/23  9970     History  Chief Complaint  Patient presents with   IVC   HPI Morgan [name] is a 58 y.o. female with history of MDD, diabetes, hypertension, hyperlipidemia presenting for homicidal ideation.  Patient is IVCd.  Was sent here from [facility].  Patient had initially arrived there for treatment.  States she just recently lost her job and this is a second time this happened in 8 months.  She mentioned to them that she had thoughts of killing her employer by poisoning them because she was very frustrated with the outcome.  Denies SI and hallucinations at this time.   HPI     Home Medications Prior to Admission medications   Medication Sig Start Date End Date Taking? Authorizing Provider  ALPRAZolam  (XANAX ) 1 MG tablet Take 1 mg by mouth at bedtime as needed for anxiety.   Yes [provider]  atorvastatin  (LIPITOR ) 80 MG tablet Take 1 tablet (80 mg total) by mouth daily. Patient taking differently: Take 80 mg by mouth at bedtime. 04/03/23  Yes Johnny Garnette LABOR, MD  glimepiride  (AMARYL ) 1 MG tablet Take 1 tablet (1 mg total) by mouth daily with breakfast. 02/06/23  Yes Shamleffer, Ibtehal Jaralla, MD  ibuprofen  (ADVIL ) 200 MG tablet Take 600 mg by mouth every 6 (six) hours as needed (For back pain).   Yes [provider]  losartan -hydrochlorothiazide  (HYZAAR) 50-12.5 MG tablet Take 1 tablet by mouth daily. Patient taking differently: Take 1 tablet by mouth at bedtime. 04/23/23  Yes Johnny Garnette LABOR, MD  metFORMIN  (GLUCOPHAGE -XR) 500 MG 24 hr tablet Take 1 tablet (500 mg total) by mouth daily with breakfast. 02/06/23  Yes Shamleffer, Ibtehal Jaralla, MD  nortriptyline  (PAMELOR ) 10 MG capsule Take 1 capsule (10 mg total) by mouth at bedtime. 05/26/23  Yes Johnny Garnette LABOR, MD  omeprazole  (PRILOSEC) 40 MG capsule Take 1 capsule (40 mg total) by mouth 2  (two) times daily. 07/08/23  Yes Kerman Vina HERO, NP  Semaglutide  (RYBELSUS ) 14 MG TABS Take 1 tablet (14 mg total) by mouth daily. 06/02/23  Yes Shamleffer, Ibtehal Jaralla, MD  venlafaxine  XR (EFFEXOR -XR) 150 MG 24 hr capsule Take 1 capsule (150 mg total) by mouth daily. 05/04/23  Yes Johnny Garnette LABOR, MD  albuterol  (VENTOLIN  HFA) 108 (90 Base) MCG/ACT inhaler Inhale 2 puffs into the lungs every 6 (six) hours as needed for wheezing or shortness of breath. Patient not taking: Reported on 07/13/2023 01/08/21   Sood, Vineet, MD      Allergies    Lisinopril     Review of Systems   See HPI for pertinent positives  Physical Exam Updated Vital Signs BP (!) 146/56 (BP Location: Left Arm)   Pulse 86   Temp 98.2 F (36.8 C) (Oral)   Resp 17   Wt 103.6 kg   LMP 03/22/1986   SpO2 98%   BMI 39.22 kg/m  Physical Exam Vitals and nursing note reviewed.  HENT:     Head: Normocephalic and atraumatic.     Mouth/Throat:     Mouth: Mucous membranes are moist.  Eyes:     General:        Right eye: No discharge.        Left eye: No discharge.     Conjunctiva/sclera: Conjunctivae normal.  Cardiovascular:     Rate and Rhythm: Normal rate and regular rhythm.  Pulses: Normal pulses.     Heart sounds: Normal heart sounds.  Pulmonary:     Effort: Pulmonary effort is normal.     Breath sounds: Normal breath sounds.  Abdominal:     General: Abdomen is flat.     Palpations: Abdomen is soft.  Skin:    General: Skin is warm and dry.  Neurological:     General: No focal deficit present.  Psychiatric:        Mood and Affect: Mood normal.     ED Results / Procedures / Treatments   Labs (all labs ordered are listed, but only abnormal results are displayed) Labs Reviewed  COMPREHENSIVE METABOLIC PANEL - Abnormal; Notable for the following components:      Result Value   Glucose, Bld 184 (*)    All other components within normal limits  RAPID URINE DRUG SCREEN, HOSP PERFORMED - Abnormal; Notable  for the following components:   Benzodiazepines POSITIVE (*)    All other components within normal limits  CBC WITH DIFFERENTIAL/PLATELET - Abnormal; Notable for the following components:   Monocytes Absolute 1.1 (*)    Abs Immature Granulocytes 0.63 (*)    All other components within normal limits  ETHANOL    EKG None  Radiology No results found.  Procedures Procedures    Medications Ordered in ED Medications  acetaminophen  (TYLENOL ) tablet 650 mg (has no administration in time range)  albuterol  (VENTOLIN  HFA) 108 (90 Base) MCG/ACT inhaler 2 puff (has no administration in time range)  ALPRAZolam  (XANAX ) tablet 0.5 mg (has no administration in time range)  alum & mag hydroxide-simeth (MAALOX/MYLANTA) 200-200-20 MG/5ML suspension 30 mL (has no administration in time range)  atorvastatin  (LIPITOR ) tablet 80 mg (has no administration in time range)  diphenhydrAMINE  (BENADRYL ) capsule 50 mg (has no administration in time range)  glimepiride  (AMARYL ) tablet 1 mg (has no administration in time range)  hydrochlorothiazide  (HYDRODIURIL ) tablet 12.5 mg (has no administration in time range)  hydrOXYzine  (ATARAX ) tablet 25 mg (has no administration in time range)  losartan  (COZAAR ) tablet 50 mg (has no administration in time range)  magnesium  hydroxide (MILK OF MAGNESIA) suspension 30 mL (has no administration in time range)  nicotine  (NICODERM CQ  - dosed in mg/24 hours) patch 14 mg (has no administration in time range)  nortriptyline  (PAMELOR ) capsule 10 mg (has no administration in time range)  pantoprazole  (PROTONIX ) EC tablet 20 mg (has no administration in time range)  venlafaxine  XR (EFFEXOR -XR) 24 hr capsule 150 mg (has no administration in time range)  haloperidol  (HALDOL ) tablet 5 mg (has no administration in time range)    And  diphenhydrAMINE  (BENADRYL ) capsule 50 mg (has no administration in time range)  haloperidol  lactate (HALDOL ) injection 5 mg (has no administration in  time range)    And  diphenhydrAMINE  (BENADRYL ) injection 50 mg (has no administration in time range)    And  LORazepam  (ATIVAN ) injection 2 mg (has no administration in time range)  haloperidol  lactate (HALDOL ) injection 10 mg (has no administration in time range)    And  diphenhydrAMINE  (BENADRYL ) injection 50 mg (has no administration in time range)    And  LORazepam  (ATIVAN ) injection 2 mg (has no administration in time range)    ED Course/ Medical Decision Making/ A&P                                 Medical Decision Making Amount and/or Complexity  of Data Reviewed Labs: ordered.   58 year old well-appearing female presenting for homicidal ideation.  Was sent by [facility] and was IVC'd.  First exam was completed prior to arrival.  Workup thus far reassuring.  Patient medically cleared and placed in psych hold for TTS evaluation.          Final Clinical Impression(s) / ED Diagnoses Final diagnoses:  Homicidal ideation    Rx / DC Orders ED Discharge Orders     None         Lang Norleen POUR, PA-C 07/14/23 0253    Griselda Norris, MD 07/14/23 [phone]

## 2023-07-14 NOTE — ED Triage Notes (Signed)
 Pt transported over by [facility] from [facility], pt having active homicidal thoughts. 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 Pt has a pending inpatient bed at [facility]

## 2023-07-14 NOTE — Progress Notes (Signed)
   07/14/23 0257  BiPAP/CPAP/SIPAP  $ Non-Invasive Home Ventilator  Initial  $ Face Mask Medium Yes  BiPAP/CPAP/SIPAP Pt Type Adult  BiPAP/CPAP/SIPAP DREAMSTATIOND  Mask Type Full face mask (per home regimen)  Mask Size Medium  Respiratory Rate 20 breaths/min  EPAP 15 cmH2O (home settings per pt)  FiO2 (%) 21 %  Patient Home Equipment No  Auto Titrate No  CPAP/SIPAP surface wiped down Yes  BiPAP/CPAP /SiPAP Vitals  Pulse Rate 89  Resp 20  SpO2 97 %

## 2023-07-14 NOTE — ED Notes (Signed)
 RN spoke to [employer] and gave report she will be here to get patient in 

## 2023-07-14 NOTE — ED Provider Notes (Addendum)
 Patient accepted to behavioral health at [facility], Dr. Marlou Porch  11:28 AM Patient awake and alert aware of transfer.   Gerhard Munch, MD 07/14/23 1108    Gerhard Munch, MD 07/14/23 1128

## 2023-07-14 NOTE — Group Note (Signed)
 Recreation Therapy Group Note   Group Topic:Stress Management  Group Date: 07/14/2023 Start Time: 1400 End Time: 1500 Facilitators: Celestia Jeoffrey BRAVO, LRT, CTRS Location:  Dayroom  Group Description: Taboo. LRT and patients played the game Taboo. The object of the game is to have peers guess the word up at the top of the card drawn that is in bold, while being sure to not use any of the descriptive words down below it on the same card. If the person attempting to explain the word in bold uses any of the descriptive words down below, they lose their turn, and no one receives that card or point. LRT and patient's took turns being the one to describe the words while the rest of the group tried to guess what they were describing. The person with the most cards from correct guesses at the end, wins.   Goal Area(s) Addressed: Patient will identify physical symptoms of stress. Patient will identify emotional symptoms of stress. Patient will identify coping skills for stress. Patient will build frustration tolerance skills.  Patient will increase communication.   Affect/Mood: Appropriate   Participation Level: Moderate   Participation Quality: Independent   Behavior: Calm and Cooperative   Speech/Thought Process: Coherent   Insight: Fair   Judgement: Fair    Modes of Intervention: Cooperative Play, Education, Exploration, Guided Discussion, and Rapport Building   Patient Response to Interventions:  Engaged and Receptive   Education Outcome:  Acknowledges education   Clinical Observations/Individualized Feedback: Morgan [name] was active in their participation of session activities and group discussion. Pt was active during the game, however, seemed to become somewhat reserved towards the end and post-activity processing discussion. Overall, pt interacted well with LRT and peers duration of session.    Plan: Continue to engage patient in RT group sessions 2-3x/week.   Jeoffrey BRAVO Celestia,  LRT, CTRS 07/14/2023 5:12 PM

## 2023-07-14 NOTE — ED Notes (Signed)
 Patient requested her HS medications, anxiety medication and CPAP at HS. Notified pharmacy and Dr. Griselda regarding med rec and ordering patient's at home medications. Also, notified RT regarding CPAP machine. Night-time medication given along with PRN medications (see MAR). RT brought CPAP machine to set up for patient. Will continue to monitor.

## 2023-07-15 LAB — GLUCOSE, CAPILLARY
Glucose-Capillary: 174 mg/dL — ABNORMAL HIGH (ref 70–99)
Glucose-Capillary: 183 mg/dL — ABNORMAL HIGH (ref 70–99)
Glucose-Capillary: 178 mg/dL — ABNORMAL HIGH (ref 70–99)
Glucose-Capillary: 243 mg/dL — ABNORMAL HIGH (ref 70–99)

## 2023-07-15 NOTE — Progress Notes (Signed)
   07/15/23 2300  Psych Admission Type (Psych Patients Only)  Admission Status Involuntary  Psychosocial Assessment  Patient Complaints Anxiety;Worrying  Eye Contact Fair  Facial Expression Animated  Affect Appropriate to circumstance  Speech Logical/coherent  Interaction Assertive  Motor Activity Slow  Appearance/Hygiene Unremarkable  Behavior Characteristics Cooperative  Mood Pleasant;Ashamed/humiliated  Thought Process  Coherency WDL  Content WDL  Delusions None reported or observed  Perception WDL  Hallucination None reported or observed  Judgment WDL  Confusion None  Danger to Self  Current suicidal ideation? Denies  Danger to Others  Danger to Others None reported or observed

## 2023-07-15 NOTE — Progress Notes (Signed)
   07/15/23 0716  Psych Admission Type (Psych Patients Only)  Admission Status Involuntary  Psychosocial Assessment  Patient Complaints Depression  Eye Contact Fair  Facial Expression Animated  Affect Appropriate to circumstance  Speech Logical/coherent  Interaction Assertive  Motor Activity Slow  Appearance/Hygiene Unremarkable  Behavior Characteristics Cooperative  Mood Pleasant  Thought Process  Coherency WDL  Content WDL  Delusions None reported or observed  Perception WDL  Hallucination None reported or observed  Judgment WDL  Confusion None  Danger to Self  Current suicidal ideation? Denies  Danger to Others  Danger to Others None reported or observed

## 2023-07-15 NOTE — Group Note (Signed)
 Date:  07/15/2023 Time:  11:21 AM  Group Topic/Focus:  Goals Group:   The focus of this group is to help patients establish daily goals to achieve during treatment and discuss how the patient can incorporate goal setting into their daily lives to aide in recovery.    Participation Level:  Active  Participation Quality:  Appropriate  Affect:  Appropriate  Cognitive:  Appropriate  Insight: Appropriate  Engagement in Group:  Engaged  Modes of Intervention:  Discussion  Dow Gemma 07/15/2023, 11:21 AM

## 2023-07-15 NOTE — Group Note (Signed)
 Date:  07/15/2023 Time:  3:22 PM  Group Topic/Focus:  Self Care:   The focus of this group is to help patients understand the importance of self care while doing a self care crossword puzzle. Also listening to relaxing music    Participation Level:  Active  Participation Quality:  Appropriate  Affect:  Appropriate  Cognitive:  Appropriate  Insight: Appropriate  Engagement in Group:  Engaged  Modes of Intervention:  Activity and Discussion  Additional Comments:    Linnell Richardson 07/15/2023, 3:22 PM

## 2023-07-15 NOTE — Progress Notes (Signed)
 CPAP is set up in patient's room by this RN. Per MD order, patient does not need a sitter while CPAP is in use. Q15 mins safety rounds will be continued.

## 2023-07-15 NOTE — BHH Counselor (Signed)
 Adult Comprehensive Assessment  Patient ID: Morgan [name], female   DOB: [birth year], 58 y.o.   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 Pt also reports she has always had a strained relationship with her mother Financial / Lack of resources (include bankruptcy): Pt reports that she is always financiall struggling Housing / Lack of housing: Pt reports that the her house lease is currently up and she is determing whether or not she wants to move in with her sister in [place]  or find a new apartment Physical health (include injuries & life threatening diseases): Pt reports that she has diabetes, high BP, high cholesterol, sleep apnea and has had cancer 4 times Social relationships: Pt reoorts that she doesn't have any social issues because she only has 3 close friends Substance abuse: None reported Bereavement / Loss: Pt reports that she has lost her dad who she was very close with, her older sister, her oldest brother and her mom has Dementia and is living in a facility  Living/Environment/Situation:   Living Arrangements: Alone Living conditions (as described by patient or guardian): Pt reports she likes her apartment Who else lives in the home?: Pt does not report How long has patient lived in current situation?: Pt reports she has been living there for 2 years What is atmosphere in current home: Comfortable  Family History:  Marital status: Single Are you sexually active?: No What is your sexual orientation?: Heterosexual Has your sexual activity been affected by drugs, alcohol, medication, or emotional stress?: Pt does not report Does patient have children?: No  Childhood History:  By whom was/is the patient raised?: Both parents Additional childhood history information: Pt reports she was raised by both parents but her parents divorced when her dad was diagnosed with MS. She repots her mom refused to take care of him. She reports that her dad did get remarried to her step-mom, who she loved and had a very good relationship with. Pt reports that her mom had always said she did not want a "fifth child" and that hurt her when she was younger. Description of patient's relationship with caregiver when they were a child: Mother: Pt describes it has non-existent. Father: "I was a daddy's girl." Patient's description of current relationship with people who raised him/her: Pt reports that her mother is still living, and that she has dementia and that they have always had a strained relationship How were you disciplined when you got in trouble as a child/adolescent?: Pt does not report Does patient have siblings?: Yes Number of Siblings: 4 Description of patient's current relationship with siblings: Pt reports  that two of her siblings have passed. Pt reports she is not talking to her brother because he kicked her out of his home last March Did patient suffer any verbal/emotional/physical/sexual abuse as a child?: No Did patient suffer from severe childhood neglect?: No Has patient ever been  sexually abused/assaulted/raped as an adolescent or adult?: No Was the patient ever a victim of a crime or a disaster?: No Witnessed domestic violence?: No Has patient been affected by domestic violence as an adult?: No  Education:  Highest grade of school patient has completed: Energy manager Degree Currently a Consulting civil engineer?: No Learning disability?: Yes What learning problems does patient have?: Pt reports that she had "always had a little bit of a learning disability" Pt reports she always had a tutor, from 3rd grade to 12th grade. She reports she doesn't know how to count money that's why she never has taken a Conservation officer, nature job  Employment/Work Situation:   Employment Situation: Employed Where is Patient Currently Employed?: Pt reports she is working at [employer]" How Long has Patient Been Employed?: Pt does not report Are You Satisfied With Your Job?: No Do You Work More Than One Job?: No Work Stressors: Pt reports she has been dropped from assignments recently and has been unable to make enough money Patient's Job has Been Impacted by Current Illness: No What is the Longest Time Patient has Held a Job?: Pt reports that she worked at [employer] for 11 years 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?: No  Financial Resources:   Surveyor, quantity resources: Sales executive, Medicaid Does patient have a Lawyer or guardian?: No  Alcohol/Substance Abuse:   What has been your use of drugs/alcohol within the last 12 months?: None reported If attempted suicide, did drugs/alcohol play a role in this?: No Alcohol/Substance Abuse Treatment Hx: Denies past history Has alcohol/substance abuse ever caused legal problems?: No  Social Support System:   Forensic psychologist System: Good Describe Community Support System: Pt reports her sister and her 2 good friends Type of faith/religion:  Non-Denominational Christian How does patient's faith help to cope with current illness?: "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:   Do You Have Hobbies?: Yes Leisure and Hobbies: Pt reports she likes to eat, watch TV, go over her friends house and see her friend's grandchildren, and she liked [place] to the mountains to visit her sister and mom  Strengths/Needs:   What is the patient's perception of their strengths?: " ilike helping people, that's why I went for that job" Patient states they can use these personal strengths during their treatment to contribute to their recovery: None reported Patient states these barriers may affect/interfere with their treatment: None reported Patient states these barriers may affect their return to the community: None reported Other important information patient would like considered in planning for their treatment: Pt reports she has started seeing a therapist, Lamond Pilot who she relly likes, and he helps her figure out whys she gets so angry. She reports she feels she is on the correct medications already but feels that she would like higher doses  Discharge Plan:   Currently receiving community mental health services: Yes (From Whom) ([facility] [place]) Patient states concerns and preferences for aftercare planning are: Pt wants to continue with therapist Lamond Pilot and psychiatrist Dr.Fry Patient states they will know when they are safe and ready for discharge  when: "When I've calmed down and maybe she can tell me how to handle it when it comes up again" Does patient have access to transportation?: Yes Does patient have financial barriers related to discharge medications?: No Patient description of barriers related to discharge medications: None Will patient be returning to same living situation after discharge?: Yes  Summary/Recommendations:   Summary and Recommendations (to be completed by the evaluator): Patient is a  58 year-old- female from [place], [place] [facility]). 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 According to the notes pt reported she was fired from one of her assignments as a Engineer, agricultural, and this is the second time in the last few months she has been let go from a job. pt reports anger towards her manager and the patient that she was taking care of. Pt currently seeing a psychiatrist and therapist at [facility] and wants to continue with that. 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 Recommendations include: crisis stabilization, therapeutic milieu, encourage group attendance and participation, medication management for mood stabilization and development of comprehensive mental wellness/sobriety plan.  Claudio Culver. 07/15/2023

## 2023-07-15 NOTE — [facility] Suicide Risk Assessment (Signed)
 [facility] Admission Suicide Risk Assessment   Nursing information obtained from:  Patient Demographic factors:  Living alone, Unemployed Current Mental Status:  NA Loss Factors:  Decrease in vocational status Historical Factors:  NA Risk Reduction Factors:  Positive social support, Sense of responsibility to family   Principal Problem: Major depressive disorder, recurrent severe without psychotic features (HCC) Diagnosis:  Principal Problem:   Major depressive disorder, recurrent severe without psychotic features (HCC)  Subjective Data: Patient denies current suicidal/homicidal ideation/intent/plan.  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  Continued Clinical Symptoms:  Alcohol Use Disorder Identification Test Final Score (AUDIT): 0 The "Alcohol Use Disorders Identification Test", Guidelines for Use in Primary Care, Second Edition.  World Science writer [facility]). Score between 0-7:  no or low risk or alcohol related problems. Score between 8-15:  moderate risk of alcohol related problems. Score between 16-19:  high risk of alcohol related problems. Score 20 or above:  warrants further diagnostic evaluation for alcohol dependence and treatment.   CLINICAL FACTORS:   Severe Anxiety and/or Agitation Bipolar Disorder:   Depressive phase Previous Psychiatric Diagnoses and Treatments   Musculoskeletal: Strength & Muscle Tone: within normal limits Gait & Station: normal Patient leans: N/A  Psychiatric Specialty Exam:  Presentation  General Appearance:  Appropriate for Environment; Casual  Eye Contact: Fair  Speech: Clear and Coherent  Speech Volume: Normal  Handedness: Right   Mood and Affect  Mood: Anxious; Irritable  Affect: Labile   Thought Process  Thought Processes: Coherent  Descriptions of Associations:Intact  Orientation:Full (Time, Place and Person)  Thought Content:Logical  History of  Schizophrenia/Schizoaffective disorder:No  Duration of Psychotic Symptoms:No data recorded Hallucinations:Hallucinations: None  Ideas of Reference:None  Suicidal Thoughts:Suicidal Thoughts: No  Homicidal Thoughts:Homicidal Thoughts: Yes, Passive HI Passive Intent and/or Plan: Without Intent; Without Means to [employer]  Memory: Recent Fair; Immediate Fair; Remote Fair  Judgment: Impaired  Insight: Shallow   Executive Functions  Concentration: Fair  Attention Span: Fair  Recall: [employer] of Knowledge: Fair  Language: Fair   Psychomotor Activity  Psychomotor Activity: Psychomotor Activity: Normal   Assets  Assets: Communication Skills; Desire for Improvement; Resilience   Sleep  Sleep: Sleep: Fair    Physical Exam: Physical Exam ROS Blood pressure 117/70, pulse 87, temperature (!) 97.5 F (36.4 C), resp. rate 15, height 5\' 4"  (1.626 m), weight 108 kg, last menstrual period 03/22/1986, SpO2 98%. Body mass index is 40.85 kg/m.   COGNITIVE FEATURES THAT CONTRIBUTE TO RISK:  Thought constriction (tunnel vision)    SUICIDE RISK:   Minimal: No identifiable suicidal ideation.  Patients presenting with no risk factors but with morbid ruminations; may be classified as minimal risk based on the severity of the depressive symptoms  PLAN OF CARE: Patient is admitted to the 24-hour facility with every 15 minute safety checks.  Patient will be monitored closely provided medication management and psychotherapeutic services.  I certify that inpatient services furnished can reasonably be expected to improve the patient's condition.   Aurelia Blotter, MD 07/15/2023, 1:47 PM

## 2023-07-15 NOTE — Plan of Care (Signed)
 ?  Problem: Education: ?Goal: Utilization of techniques to improve thought processes will improve ?Outcome: Not Progressing ?Goal: Knowledge of the prescribed therapeutic regimen will improve ?Outcome: Not Progressing ?  ?Problem: Activity: ?Goal: Interest or engagement in leisure activities will improve ?Outcome: Not Progressing ?Goal: Imbalance in normal sleep/wake cycle will improve ?Outcome: Not Progressing ?  ?Problem: Coping: ?Goal: Coping ability will improve ?Outcome: Not Progressing ?Goal: Will verbalize feelings ?Outcome: Not Progressing ?  ?Problem: Health Behavior/Discharge Planning: ?Goal: Ability to make decisions will improve ?Outcome: Not Progressing ?Goal: Compliance with therapeutic regimen will improve ?Outcome: Not Progressing ?  ?Problem: Role Relationship: ?Goal: Will demonstrate positive changes in social behaviors and relationships ?Outcome: Not Progressing ?  ?Problem: Safety: ?Goal: Ability to disclose and discuss suicidal ideas will improve ?Outcome: Not Progressing ?Goal: Ability to identify and utilize support systems that promote safety will improve ?Outcome: Not Progressing ?  ?Problem: Self-Concept: ?Goal: Will verbalize positive feelings about self ?Outcome: Not Progressing ?Goal: Level of anxiety will decrease ?Outcome: Not Progressing ?  ?

## 2023-07-15 NOTE — Progress Notes (Signed)
   07/15/23 0400  Psych Admission Type (Psych Patients Only)  Admission Status Involuntary  Psychosocial Assessment  Patient Complaints Depression;Worrying  Eye Contact Fair  Facial Expression Animated  Affect Appropriate to circumstance  Speech Logical/coherent  Interaction Assertive  Motor Activity Slow  Appearance/Hygiene Unremarkable;In scrubs  Behavior Characteristics Appropriate to situation;Cooperative  Mood Pleasant;Ashamed/humiliated  Thought Process  Coherency WDL  Content WDL  Delusions None reported or observed  Perception WDL  Hallucination None reported or observed  Judgment WDL  Confusion None  Danger to Self  Current suicidal ideation? Denies  Danger to Others  Danger to Others None reported or observed   Patient denied SI, HI, hallucinations, and delusions.  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  She wanted her "boss" to tell her why, but was not given any explanation.  She expressed care for her all clients , and was offended that she was "let go".  She was pleasant and engaging.  She was sociable while in the dayroom.    CPAP was placed by [facility] with RT.  No s/sx of acute distress.  No complaints or concerns voiced.  Q22m check in place as per protocol.

## 2023-07-15 NOTE — BH IP Treatment Plan (Signed)
 Interdisciplinary Treatment and Diagnostic Plan Update  07/15/2023 Time of Session: 9:45 AM  Morgan [name] MRN: [id number]  Principal Diagnosis: Major depressive disorder, recurrent severe without psychotic features (HCC)  Secondary Diagnoses: Principal Problem:   Major depressive disorder, recurrent severe without psychotic features (HCC)   Current Medications:  Current Facility-Administered Medications  Medication Dose Route Frequency Provider Last Rate Last Admin   acetaminophen  (TYLENOL ) tablet 650 mg  650 mg Oral Q6H PRN Onuoha, Josephine C, NP       albuterol  (PROVENTIL ) (2.5 MG/3ML) 0.083% nebulizer solution 3 mL  3 mL Inhalation Q6H PRN Michel Agreste, Josephine C, NP       ALPRAZolam  (XANAX ) tablet 0.5 mg  0.5 mg Oral BID PRN Onuoha, Josephine C, NP       alum & mag hydroxide-simeth (MAALOX/MYLANTA) 200-200-20 MG/5ML suspension 30 mL  30 mL Oral Q4H PRN Michel Agreste, Josephine C, NP       atorvastatin  (LIPITOR ) tablet 80 mg  80 mg Oral Daily Onuoha, Josephine C, NP   80 mg at 07/15/23 4098   diphenhydrAMINE  (BENADRYL ) capsule 50 mg  50 mg Oral Q6H PRN Onuoha, Josephine C, NP       haloperidol  (HALDOL ) tablet 5 mg  5 mg Oral TID PRN Onuoha, Josephine C, NP       And   diphenhydrAMINE  (BENADRYL ) capsule 50 mg  50 mg Oral TID PRN Onuoha, Josephine C, NP       haloperidol  lactate (HALDOL ) injection 10 mg  10 mg Intramuscular TID PRN Onuoha, Josephine C, NP       And   diphenhydrAMINE  (BENADRYL ) injection 50 mg  50 mg Intramuscular TID PRN Onuoha, Josephine C, NP       And   LORazepam  (ATIVAN ) injection 2 mg  2 mg Intramuscular TID PRN Onuoha, Josephine C, NP       haloperidol  lactate (HALDOL ) injection 5 mg  5 mg Intramuscular TID PRN Onuoha, Josephine C, NP       And   diphenhydrAMINE  (BENADRYL ) injection 50 mg  50 mg Intramuscular TID PRN Onuoha, Josephine C, NP       And   LORazepam  (ATIVAN ) injection 2 mg  2 mg Intramuscular TID PRN Onuoha, Josephine C, NP       glimepiride   (AMARYL ) tablet 1 mg  1 mg Oral Q breakfast Onuoha, Josephine C, NP   1 mg at 07/15/23 1191   hydrochlorothiazide  (HYDRODIURIL ) tablet 12.5 mg  12.5 mg Oral Daily Onuoha, Josephine C, NP   12.5 mg at 07/15/23 4782   hydrOXYzine  (ATARAX ) tablet 25 mg  25 mg Oral TID PRN Onuoha, Josephine C, NP       losartan  (COZAAR ) tablet 50 mg  50 mg Oral Daily Onuoha, Josephine C, NP   50 mg at 07/15/23 9562   magnesium  hydroxide (MILK OF MAGNESIA) suspension 30 mL  30 mL Oral Daily PRN Onuoha, Josephine C, NP       nicotine  (NICODERM CQ  - dosed in mg/24 hours) patch 14 mg  14 mg Transdermal Q0600 Onuoha, Josephine C, NP       nortriptyline  (PAMELOR ) capsule 10 mg  10 mg Oral QHS Onuoha, Josephine C, NP   10 mg at 07/14/23 2203   pantoprazole  (PROTONIX ) EC tablet 20 mg  20 mg Oral BID Onuoha, Josephine C, NP   20 mg at 07/15/23 0944   venlafaxine  XR (EFFEXOR -XR) 24 hr capsule 150 mg  150 mg Oral Daily Onuoha, Josephine C, NP   150 mg at  07/15/23 0943   PTA Medications: Medications Prior to Admission  Medication Sig Dispense Refill Last Dose/Taking   albuterol  (VENTOLIN  HFA) 108 (90 Base) MCG/ACT inhaler Inhale 2 puffs into the lungs every 6 (six) hours as needed for wheezing or shortness of breath. (Patient not taking: Reported on 07/13/2023) 8 g 6    ALPRAZolam  (XANAX ) 1 MG tablet Take 1 mg by mouth at bedtime as needed for anxiety.      atorvastatin  (LIPITOR ) 80 MG tablet Take 1 tablet (80 mg total) by mouth daily. (Patient taking differently: Take 80 mg by mouth at bedtime.) 90 tablet 1    glimepiride  (AMARYL ) 1 MG tablet Take 1 tablet (1 mg total) by mouth daily with breakfast. 90 tablet 3    ibuprofen  (ADVIL ) 200 MG tablet Take 600 mg by mouth every 6 (six) hours as needed (For back pain).      losartan -hydrochlorothiazide  (HYZAAR) 50-12.5 MG tablet Take 1 tablet by mouth daily. (Patient taking differently: Take 1 tablet by mouth at bedtime.) 90 tablet 1    metFORMIN  (GLUCOPHAGE -XR) 500 MG 24 hr tablet Take  1 tablet (500 mg total) by mouth daily with breakfast. 90 tablet 3    nortriptyline  (PAMELOR ) 10 MG capsule Take 1 capsule (10 mg total) by mouth at bedtime. 90 capsule 3    omeprazole  (PRILOSEC) 40 MG capsule Take 1 capsule (40 mg total) by mouth 2 (two) times daily. 60 capsule 11    Semaglutide  (RYBELSUS ) 14 MG TABS Take 1 tablet (14 mg total) by mouth daily. 30 tablet 1    venlafaxine  XR (EFFEXOR -XR) 150 MG 24 hr capsule Take 1 capsule (150 mg total) by mouth daily. 90 capsule 1     Patient Stressors: Financial difficulties   Occupational concerns    Patient Strengths: Ability for insight  General fund of knowledge  Motivation for treatment/growth  Physical Health   Treatment Modalities: Medication Management, Group therapy, Case management,  1 to 1 session with clinician, Psychoeducation, Recreational therapy.   Physician Treatment Plan for Primary Diagnosis: Major depressive disorder, recurrent severe without psychotic features (HCC) Long Term Goal(s):     Short Term Goals:    Medication Management: Evaluate patient's response, side effects, and tolerance of medication regimen.  Therapeutic Interventions: 1 to 1 sessions, Unit Group sessions and Medication administration.  Evaluation of Outcomes: Progressing  Physician Treatment Plan for Secondary Diagnosis: Principal Problem:   Major depressive disorder, recurrent severe without psychotic features (HCC)  Long Term Goal(s):     Short Term Goals:       Medication Management: Evaluate patient's response, side effects, and tolerance of medication regimen.  Therapeutic Interventions: 1 to 1 sessions, Unit Group sessions and Medication administration.  Evaluation of Outcomes: Progressing   RN Treatment Plan for Primary Diagnosis: Major depressive disorder, recurrent severe without psychotic features (HCC) Long Term Goal(s): Knowledge of disease and therapeutic regimen to maintain health will improve  Short Term Goals:  Ability to remain free from injury will improve, Ability to verbalize frustration and anger appropriately will improve, Ability to demonstrate self-control, Ability to participate in decision making will improve, Ability to verbalize feelings will improve, Ability to disclose and discuss suicidal ideas, Ability to identify and develop effective coping behaviors will improve, and Compliance with prescribed medications will improve  Medication Management: RN will administer medications as ordered by provider, will assess and evaluate patient's response and provide education to patient for prescribed medication. RN will report any adverse and/or side effects to prescribing provider.  Therapeutic Interventions: 1 on 1 counseling sessions, Psychoeducation, Medication administration, Evaluate responses to treatment, Monitor vital signs and CBGs as ordered, Perform/monitor CIWA, COWS, AIMS and Fall Risk screenings as ordered, Perform wound care treatments as ordered.  Evaluation of Outcomes: Progressing   LCSW Treatment Plan for Primary Diagnosis: Major depressive disorder, recurrent severe without psychotic features (HCC) Long Term Goal(s): Safe transition to appropriate next level of care at discharge, Engage patient in therapeutic group addressing interpersonal concerns.  Short Term Goals: Engage patient in aftercare planning with referrals and resources, Increase social support, Increase ability to appropriately verbalize feelings, Increase emotional regulation, Facilitate acceptance of mental health diagnosis and concerns, Facilitate patient progression through stages of change regarding substance use diagnoses and concerns, Identify triggers associated with mental health/substance abuse issues, and Increase skills for wellness and recovery  Therapeutic Interventions: Assess for all discharge needs, 1 to 1 time with Social worker, Explore available resources and support systems, Assess for adequacy in  community support network, Educate family and significant other(s) on suicide prevention, Complete Psychosocial Assessment, Interpersonal group therapy.  Evaluation of Outcomes: Progressing   Progress in Treatment: Attending groups: Yes. Participating in groups: Yes. Taking medication as prescribed: Yes. Toleration medication: Yes. Family/Significant other contact made: No, will contact:  CSW will contact if given permission  Patient understands diagnosis: Yes. Discussing patient identified problems/goals with staff: Yes. Medical problems stabilized or resolved: Yes. Denies suicidal/homicidal ideation: Yes. Issues/concerns per patient self-inventory: No. Other: None   New problem(s) identified: No, Describe:  None identified   New Short Term/Long Term Goal(s):  elimination of symptoms of psychosis, medication management for mood stabilization; elimination of SI thoughts; development of comprehensive mental wellness plan.   Patient Goals:  "I want to know why I get so angry, that's why I started seeing a counselor"   Discharge Plan or Barriers: CSW will assist with appropriate discharge planning   Reason for Continuation of Hospitalization: Medication stabilization Other; describe Anger Management  Estimated Length of Stay: 1 to 7 days   Last 3 [place] Suicide Severity Risk Score: Flowsheet Row Admission (Current) from 07/14/2023 in [facility] Most recent reading at 07/14/2023  1:59 PM ED from 07/14/2023 in [facility] Most recent reading at 07/14/2023 12:49 AM ED from 07/13/2023 in [facility] Most recent reading at 07/13/2023  1:33 PM  C-SSRS RISK CATEGORY No Risk No Risk No Risk       Last PHQ 2/9 Scores:    06/15/2023    2:13 PM 05/26/2023   11:46 AM 02/24/2023    1:43 PM  Depression screen PHQ 2/9  Decreased Interest 3 3 3   Down, Depressed, Hopeless 3 3 3   PHQ - 2 Score 6  6 6   Altered sleeping 2 3 0  Tired, decreased energy 3 3 3   Change in appetite 3 3 3   Feeling bad or failure about yourself  3 3 3   Trouble concentrating 0 1 3  Moving slowly or fidgety/restless 0 0 3  Suicidal thoughts 0 2 2  PHQ-9 Score 17 21 23   Difficult doing work/chores Somewhat difficult Somewhat difficult Extremely dIfficult    Scribe for Treatment Team: Claudio Culver, LCSWA 07/15/2023 11:22 AM
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
## 2023-07-16 LAB — GLUCOSE, CAPILLARY
Glucose-Capillary: 213 mg/dL — ABNORMAL HIGH (ref 70–99)
Glucose-Capillary: 238 mg/dL — ABNORMAL HIGH (ref 70–99)

## 2023-07-16 MED ORDER — METFORMIN HCL ER 500 MG PO TB24
500.0000 mg | ORAL_TABLET | Freq: Every day | ORAL | Status: DC
  Administered 2023-07-16 – 2023-07-20 (×5): 500 mg via ORAL
  Filled 2023-07-16 (×5): qty 1

## 2023-07-16 MED ORDER — OXCARBAZEPINE 150 MG PO TABS
150.0000 mg | ORAL_TABLET | Freq: Two times a day (BID) | ORAL | Status: DC
  Administered 2023-07-16 (×2): 150 mg via ORAL
  Filled 2023-07-16 (×3): qty 1

## 2023-07-16 NOTE — Progress Notes (Signed)
[facility] MD Progress Note  07/16/2023 2:35 PM Morgan [name]  MRN:  [id number] Patient is a 58 year old white female with history of depression and bipolar disorder who presented to the emergency room with homicidal ideation against her work client.  Patient was admitted to geropsych unit at [facility] for further stabilization.  Patient is currently on IVC.  Patient is her own guardian.  Subjective:  Chart reviewed, case discussed in multidisciplinary meeting, patient seen during rounds.  Today during interview patient is noted to be resting in her room.  She offers no complaints.  She reports that she was able to get the phone number for her boss and talk to her.  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  Patient remains upset about the situation.  She states that she lost 2 jobs in a year and wants to know what she can do better to have a better life.  She did acknowledge that she has anger issues.  She denies having any suicidal or homicidal ideation/intent/plan.  She reports that she had no intention of hurting any of her clients because she understands that they have dementia and is not their fault.  She reports having fair appetite and sleep since being on the unit.  She denies having any panic attacks.  She did agree with the plan to take Trileptal 150 mg twice daily for further mood stabilization.  Principal Problem: Major depressive disorder, recurrent severe without psychotic features (HCC)    Past Psychiatric History: see h&P Family History:  Family History  Problem Relation Age of Onset   Dementia Mother    Asthma Mother    Multiple sclerosis Father    Uterine cancer Sister 81   Diabetes Brother    Diabetes Brother    Basal cell carcinoma Brother        lower lip   Diabetes Maternal Grandfather    Colon polyps Paternal Grandmother    Stomach cancer Paternal Grandmother        dx 75s-80s   Colon polyps Paternal  Grandfather    Arthritis Other    Prostate cancer Other        grandfather per pt but she is unaware of which grandfather   Coronary artery disease Other    Pancreatic cancer Neg Hx    Esophageal cancer Neg Hx    Rectal cancer Neg Hx    Social History:  Social History   Substance and Sexual Activity  Alcohol Use No   Alcohol/week: 0.0 standard drinks of alcohol     Social History   Substance and Sexual Activity  Drug Use No    Social History   Socioeconomic History   Marital status: Single    Spouse name: Not on file   Number of children: Not on file   Years of education: Not on file   Highest education level: Not on file  Occupational History   Occupation: billing     Employer: [employer]  Tobacco Use   Smoking status: Some Days    Types: Cigarettes   Smokeless tobacco: Never   Tobacco comments:    Patient states she smokes about 1-2 cigarettes a day some days.  Updated 03/23/2023. am  Vaping Use   Vaping status: Never Used  Substance and Sexual Activity   Alcohol use: No    Alcohol/week: 0.0 standard drinks of alcohol   Drug use: No   Sexual  activity: Yes    Birth control/protection: Surgical  Other Topics Concern   Not on file  Social History Narrative   Originally from [place], [place], & moved to [place] at 58 y.o. She reports she started a new job in July 2016. 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 No pets currently. No bird, mold, or hot tub exposure.       Right Handed    Lives in a one story home    Social Drivers of Health   Financial Resource Strain: Not on file  Food Insecurity: No Food Insecurity (07/14/2023)   Hunger Vital Sign    Worried About Running Out of Food in the Last Year: Never true    Ran Out of Food in the Last Year: Never true  Transportation Needs: No Transportation Needs  (07/14/2023)   PRAPARE - Administrator, Civil Service (Medical): No    Lack of Transportation (Non-Medical): No  Physical Activity: Not on file  Stress: Stress Concern Present (04/27/2023)   [employer] of Occupational Health - Occupational Stress Questionnaire    Feeling of Stress : Very much  Social Connections: Moderately Integrated (07/14/2023)   Social Connection and Isolation Panel [NHANES]    Frequency of Communication with Friends and Family: Twice a week    Frequency of Social Gatherings with Friends and Family: Once a week    Attends Religious Services: More than 4 times per year    Active Member of Clubs or Organizations: Yes    Attends Engineer, structural: More than 4 times per year    Marital Status: Never married   Past Medical History:  Past Medical History:  Diagnosis Date   Anal fissure    saw Dr. Loretha Brasil    Anxiety    on meds   Arthritis    on meds   Asthma    uses inhaler if needed   Carpal tunnel syndrome    Depression    on meds   Diabetes mellitus    on meds   GERD (gastroesophageal reflux disease)    on meds   History of ovarian cancer in adulthood    Hyperlipidemia    on meds   Hypertension    on meds   Ovarian cancer (HCC) 1986   1996   Skin cancer (melanoma) (HCC) 1997   Sleep apnea    uses CPAP    Tubular adenoma of colon 01/2014    Past Surgical History:  Procedure Laterality Date   ABDOMINAL HYSTERECTOMY  06/30/1984   BSO   BASAL CELL CARCINOMA EXCISION  10/09/2012   lower lip per Dr. Park Liter    COLONOSCOPY  03/23/2019   per Dr. Adela Lank, adenomatous polyps, repeat in 3 yrs   GLBS tumor ovary 1986     HEMORRHOID SURGERY     HIP FRACTURE SURGERY Right 2007   MELANOMA EXCISION  07/01/1995   upper chest     Sleep: Fair  Appetite:  Fair  Current Medications: Current Facility-Administered Medications  Medication Dose Route Frequency Provider Last Rate Last Admin   acetaminophen  (TYLENOL) tablet 650 mg  650 mg Oral Q6H PRN Onuoha, Josephine C, NP       albuterol (PROVENTIL) (2.5 MG/3ML) 0.083% nebulizer solution 3 mL  3 mL Inhalation Q6H PRN Earney Navy,  NP       ALPRAZolam Prudy Feeler) tablet 0.5 mg  0.5 mg Oral BID PRN Dahlia Byes C, NP   0.5 mg at 07/16/23 0915   alum & mag hydroxide-simeth (MAALOX/MYLANTA) 200-200-20 MG/5ML suspension 30 mL  30 mL Oral Q4H PRN Earney Navy, NP       atorvastatin (LIPITOR) tablet 80 mg  80 mg Oral Daily Dahlia Byes C, NP   80 mg at 07/16/23 0915   diphenhydrAMINE (BENADRYL) capsule 50 mg  50 mg Oral Q6H PRN Dahlia Byes C, NP       haloperidol (HALDOL) tablet 5 mg  5 mg Oral TID PRN Earney Navy, NP       And   diphenhydrAMINE (BENADRYL) capsule 50 mg  50 mg Oral TID PRN Dahlia Byes C, NP       haloperidol lactate (HALDOL) injection 10 mg  10 mg Intramuscular TID PRN Dahlia Byes C, NP       And   diphenhydrAMINE (BENADRYL) injection 50 mg  50 mg Intramuscular TID PRN Dahlia Byes C, NP       And   LORazepam (ATIVAN) injection 2 mg  2 mg Intramuscular TID PRN Dahlia Byes C, NP       haloperidol lactate (HALDOL) injection 5 mg  5 mg Intramuscular TID PRN Dahlia Byes C, NP       And   diphenhydrAMINE (BENADRYL) injection 50 mg  50 mg Intramuscular TID PRN Dahlia Byes C, NP       And   LORazepam (ATIVAN) injection 2 mg  2 mg Intramuscular TID PRN Dahlia Byes C, NP       glimepiride (AMARYL) tablet 1 mg  1 mg Oral Q breakfast Onuoha, Josephine C, NP   1 mg at 07/16/23 0915   hydrochlorothiazide (HYDRODIURIL) tablet 12.5 mg  12.5 mg Oral Daily Dahlia Byes C, NP   12.5 mg at 07/16/23 0915   hydrOXYzine (ATARAX) tablet 25 mg  25 mg Oral TID PRN Earney Navy, NP       losartan (COZAAR) tablet 50 mg  50 mg Oral Daily Dahlia Byes C, NP   50 mg at 07/16/23 0915   magnesium hydroxide (MILK OF MAGNESIA) suspension 30 mL  30 mL Oral Daily PRN Dahlia Byes C, NP       metFORMIN (GLUCOPHAGE-XR) 24 hr tablet 500 mg  500 mg Oral Q breakfast Verner Chol, MD   500 mg at 07/16/23 1155   nicotine (NICODERM CQ - dosed in mg/24 hours) patch 14 mg  14 mg Transdermal Q0600 Dahlia Byes C, NP       nortriptyline (PAMELOR) capsule 10 mg  10 mg Oral QHS Onuoha, Josephine C, NP   10 mg at 07/15/23 2123   OXcarbazepine (TRILEPTAL) tablet 150 mg  150 mg Oral BID Verner Chol, MD   150 mg at 07/16/23 1155   pantoprazole (PROTONIX) EC tablet 20 mg  20 mg Oral BID Dahlia Byes C, NP   20 mg at 07/16/23 0915   venlafaxine XR (EFFEXOR-XR) 24 hr capsule 150 mg  150 mg Oral Daily Dahlia Byes C, NP   150 mg at 07/16/23 1610    Lab Results:  Results for orders placed or performed during the hospital encounter of 07/14/23 (from the past 48 hours)  Glucose, capillary     Status: Abnormal   Collection Time: 07/14/23  7:57 PM  Result Value Ref Range   Glucose-Capillary 219 (H) 70 - 99 mg/dL  Comment: Glucose reference range applies only to samples taken after fasting for at least 8 hours.  Glucose, capillary     Status: Abnormal   Collection Time: 07/15/23  7:49 AM  Result Value Ref Range   Glucose-Capillary 174 (H) 70 - 99 mg/dL    Comment: Glucose reference range applies only to samples taken after fasting for at least 8 hours.  Glucose, capillary     Status: Abnormal   Collection Time: 07/15/23 11:36 AM  Result Value Ref Range   Glucose-Capillary 183 (H) 70 - 99 mg/dL    Comment: Glucose reference range applies only to samples taken after fasting for at least 8 hours.  Glucose, capillary     Status: Abnormal   Collection Time: 07/15/23  4:15 PM  Result Value Ref Range   Glucose-Capillary 178 (H) 70 - 99 mg/dL    Comment: Glucose reference range applies only to samples taken after fasting for at least 8 hours.  Glucose, capillary     Status: Abnormal   Collection Time: 07/15/23  7:44 PM  Result Value Ref Range    Glucose-Capillary 243 (H) 70 - 99 mg/dL    Comment: Glucose reference range applies only to samples taken after fasting for at least 8 hours.  Glucose, capillary     Status: Abnormal   Collection Time: 07/16/23  7:42 AM  Result Value Ref Range   Glucose-Capillary 213 (H) 70 - 99 mg/dL    Comment: Glucose reference range applies only to samples taken after fasting for at least 8 hours.   *Note: Due to a large number of results and/or encounters for the requested time period, some results have not been displayed. A complete set of results can be found in Results Review.    Blood Alcohol level:  Lab Results  Component Value Date   ETH <10 07/14/2023   ETH <10 12/29/2022    Metabolic Disorder Labs: Lab Results  Component Value Date   HGBA1C 7.7 (H) 07/13/2023   MPG 174.29 07/13/2023   MPG 122.63 12/29/2022   No results found for: "PROLACTIN" Lab Results  Component Value Date   CHOL 158 07/13/2023   TRIG 298 (H) 07/13/2023   HDL 41 07/13/2023   CHOLHDL 3.9 07/13/2023   VLDL 60 (H) 07/13/2023   LDLCALC 57 07/13/2023   LDLCALC 65 12/29/2022    Physical Findings: AIMS:  , ,  ,  ,    CIWA:    COWS:      Psychiatric Specialty Exam:  Presentation  General Appearance:  Appropriate for Environment  Eye Contact: Fair  Speech: Clear and Coherent  Speech Volume: Normal  Handedness: Right   Mood and Affect  Mood: Anxious  Affect: Depressed   Thought Process  Thought Processes: Coherent  Descriptions of Associations:Intact  Orientation:Full (Time, Place and Person)  Thought Content:Logical  History of Schizophrenia/Schizoaffective disorder:No  Duration of Psychotic Symptoms:No data recorded Hallucinations:Hallucinations: None  Ideas of Reference:None  Suicidal Thoughts:Suicidal Thoughts: No  Homicidal Thoughts:Homicidal Thoughts: No HI Passive Intent and/or Plan: Without Intent; Without Means to [employer]  Memory: Immediate  Fair; Recent Fair; Remote Fair  Judgment: Impaired  Insight: Shallow   Executive Functions  Concentration: Fair  Attention Span: Fair  Recall: [employer] of Knowledge: Fair  Language: Fair   Psychomotor Activity  Psychomotor Activity: Psychomotor Activity: Normal  Musculoskeletal: Strength & Muscle Tone: within normal limits Gait & Station: normal Assets  Assets: Manufacturing systems engineer; Desire for Improvement; Resilience  Physical Exam: Physical Exam Vitals and nursing note reviewed.  HENT:     Head: Normocephalic.     Nose: Nose normal.     Mouth/Throat:     Mouth: Mucous membranes are moist.  Eyes:     Pupils: Pupils are equal, round, and reactive to light.  Cardiovascular:     Rate and Rhythm: Normal rate.     Pulses: Normal pulses.  Abdominal:     General: Bowel sounds are normal.  Skin:    General: Skin is warm.  Neurological:     General: No focal deficit present.    Review of Systems  Constitutional: Negative.   HENT: Negative.    Eyes: Negative.   Respiratory: Negative.    Cardiovascular: Negative.   Musculoskeletal: Negative.   Skin: Negative.   Neurological: Negative.    Blood pressure 129/66, pulse (!) 105, temperature 98.1 F (36.7 C), resp. rate 18, height 5\' 4"  (1.626 m), weight 108 kg, last menstrual period 03/22/1986, SpO2 100%. Body mass index is 40.85 kg/m.  Diagnosis: Principal Problem:   Major depressive disorder, recurrent severe without psychotic features [facility])  Treatment Plan Summary: Daily contact with patient to assess and evaluate symptoms and progress in treatment Patient is admitted to locked unit under safety precautions 2.  Patient has been started on medications including  Continue Effexor XR 150 mg daily, nortriptyline 10 mg nightly, Xanax 0.5 mg twice daily as needed anxiety.  W Trileptal 150 mg twice daily for further mood stabilization.  Patient gave consent for the trial of Trileptal for mood  stabilization.  Side effect profile was discussed in detail Recommend to participate in the group and milieu therapy Patient is on every 15 checks 3.  Patient was encouraged to attend group and work on coping strategies 4.  Social worker consulted to get collateral and help with a safe discharge plan Verner Chol, MD 07/16/2023, 2:35 PM

## 2023-07-16 NOTE — Progress Notes (Signed)
   07/16/23 2346  Psychosocial Assessment  Patient Complaints Apathy;Worrying;Irritability  Facial Expression Anxious  Affect Preoccupied  Speech Rapid  Mood Preoccupied;Irritable  Thought Process  Content Preoccupation

## 2023-07-16 NOTE — Inpatient Diabetes Management (Signed)
Inpatient Diabetes Program Recommendations  AACE/ADA: New Consensus Statement on Inpatient Glycemic Control (2015)  Target Ranges:  Prepandial:   less than 140 mg/dL      Peak postprandial:   less than 180 mg/dL (1-2 hours)      Critically ill patients:  140 - 180 mg/dL   Lab Results  Component Value Date   GLUCAP 213 (H) 07/16/2023   HGBA1C 7.7 (H) 07/13/2023    Review of Glycemic Control  Latest Reference Range & Units 07/15/23 07:49 07/15/23 11:36 07/15/23 16:15 07/15/23 19:44 07/16/23 07:42  Glucose-Capillary 70 - 99 mg/dL 696 (H) 295 (H) 284 (H) 243 (H) 213 (H)  (H): Data is abnormally high Diabetes history: Type 2 DM Outpatient Diabetes medications: Amaryl 1 mg every day, Metformin 500 mg every day, Rybelsus 14 mg QD Current orders for Inpatient glycemic control: Metformin 500 mg every day, Amaryl 1 mg QD  Inpatient Diabetes Program Recommendations:    Trends exceed inpatient goals.   Consider adding Novolog 0-9 units TID & HS  Thanks, Lujean Rave, MSN, RNC-OB Diabetes Coordinator [phone] (8a-5p)

## 2023-07-16 NOTE — Progress Notes (Deleted)
   07/16/23 2300  Psych Admission Type (Psych Patients Only)  Admission Status Involuntary  Psychosocial Assessment  Patient Complaints None  Eye Contact Fair  Facial Expression Animated  Affect Appropriate to circumstance  Speech Logical/coherent  Interaction Assertive  Motor Activity Slow  Appearance/Hygiene In scrubs  Behavior Characteristics Cooperative  Mood Pleasant  Thought Process  Coherency WDL  Content WDL  Delusions None reported or observed  Perception WDL  Hallucination None reported or observed  Judgment WDL  Confusion None  Danger to Self  Current suicidal ideation? Denies  Danger to Others  Danger to Others None reported or observed

## 2023-07-16 NOTE — Progress Notes (Signed)
   07/16/23 0600  15 Minute Checks  Location Bedroom  Visual Appearance Calm  Behavior Sleeping  Sleep (Behavioral Health Patients Only)  Calculate sleep? (Click Yes once per 24 hr at 0600 safety check) Yes  Documented sleep last 24 hours 9.75

## 2023-07-16 NOTE — Group Note (Signed)
Date:  07/16/2023 Time:  11:19 AM  Group Topic/Focus:  Identifying Needs:   The focus of this group is to help patients identify their personal needs that have been historically problematic and identify healthy behaviors to address their needs.    Participation Level:  Active  Participation Quality:  Attentive, Sharing, and Supportive  Affect:  Appropriate  Cognitive:  Alert, Appropriate, and Oriented  Insight: Appropriate and Good  Engagement in Group:  Engaged and Supportive  Modes of Intervention:  Education  Additional Comments:     [name] 07/16/2023, 11:19 AM

## 2023-07-16 NOTE — Progress Notes (Signed)
   07/16/23 0743  Psych Admission Type (Psych Patients Only)  Admission Status Involuntary  Psychosocial Assessment  Patient Complaints None  Eye Contact Fair  Facial Expression Animated  Affect Appropriate to circumstance  Speech Logical/coherent  Interaction Assertive  Motor Activity Slow  Appearance/Hygiene Unremarkable  Thought Process  Coherency WDL  Content WDL  Delusions None reported or observed  Perception WDL  Hallucination None reported or observed  Judgment WDL  Confusion None  Danger to Self  Current suicidal ideation? Denies  Danger to Others  Danger to Others None reported or observed

## 2023-07-16 NOTE — Progress Notes (Signed)
   07/16/23 2300  Psych Admission Type (Psych Patients Only)  Admission Status Involuntary  Psychosocial Assessment  Patient Complaints None  Eye Contact Fair  Facial Expression Animated  Affect Appropriate to circumstance  Speech Logical/coherent  Interaction Assertive  Motor Activity Slow  Appearance/Hygiene In scrubs  Behavior Characteristics Cooperative  Mood Pleasant  Thought Process  Coherency WDL  Content WDL  Delusions None reported or observed  Perception WDL  Hallucination None reported or observed  Judgment WDL  Confusion None  Danger to Self  Current suicidal ideation? Denies  Danger to Others  Danger to Others None reported or observed

## 2023-07-16 NOTE — Progress Notes (Signed)
Patient is at the nurses station, and continuing to talk about "my mind is going a mile a minute because I need to get out here. I am already thinking about talking to the doctor and telling him to let me the f*uckout of here" patient is standing at the nursing station. Supporting listening and encouragement is given at this time.

## 2023-07-16 NOTE — Group Note (Signed)
Recreation Therapy Group Note   Group Topic:Team Building  Group Date: 07/16/2023 Start Time: 1400 End Time: 1500 Facilitators: Rosina Lowenstein, LRT, CTRS Location:  Dayroom  Group Description: Scattergories. LRT and pts played rounds of the game Scattergories while divided up in two groups. Scattergories requires you to think and write fast, communicate clearly, and work together all while under a time restraint. LRT and peers discussed the importance of working well with others, communicating effectively, and being a part of a team. LRT and pts discussed how this can be used post discharge.   Goal Areas Addressed:  Patient will engage in competitive recreational game.  Patient will increase communication skills.  Patient will practice being a part of a team.   Affect/Mood: Appropriate   Participation Level: Active and Engaged   Participation Quality: Independent   Behavior: Alert, Appropriate, and Cooperative   Speech/Thought Process: Coherent   Insight: Good   Judgement: Good   Modes of Intervention: Competitive Play, Group work, and Team-building   Patient Response to Interventions:  Attentive, Engaged, Interested , and Requested additional information/resources    Education Outcome:  Acknowledges education   Clinical Observations/Individualized Feedback: Kennedi was active in their participation of session activities and group discussion. Pt volunteered to be the groups Clinical research associate for the game. After group was over, pt asked LRT to write down the name of the game and shared that she enjoyed it and wants to play at home. Pt interacted well with LRT and peers duration of session.    Plan: Continue to engage patient in RT group sessions 2-3x/week.   Rosina Lowenstein, LRT, CTRS 07/16/2023 4:55 PM

## 2023-07-16 NOTE — Plan of Care (Signed)

## 2023-07-17 LAB — GLUCOSE, CAPILLARY: Glucose-Capillary: 182 mg/dL — ABNORMAL HIGH (ref 70–99)

## 2023-07-17 MED ORDER — ARIPIPRAZOLE 5 MG PO TABS
5.0000 mg | ORAL_TABLET | Freq: Every day | ORAL | Status: DC
  Administered 2023-07-17 – 2023-07-19 (×3): 5 mg via ORAL
  Filled 2023-07-17 (×3): qty 1

## 2023-07-17 NOTE — Group Note (Signed)
Recreation Therapy Group Note   Group Topic:Emotion Expression  Group Date: 07/17/2023 Start Time: 1400 End Time: 1510 Facilitators: Rosina Lowenstein, LRT, CTRS Location:  Dayroom  Group Description: Positivity Collage. LRT and patients discussed the importance of having a positive mindset and being happy. Patients received magazines, safety scissors, a glue stick and a piece of paper. Pts were encouraged to find images or words in the magazines that showed "happiness" or positivity to them. Pt shared their collage with the group once they were finished. LRT and pts discussed how it can be difficult to always have a positive mindset, especially when they have mental health challenges.   Goal Area(s) Addressed:  Pt will identify things associate with positivity. Pt will reduce negative thinking. Pt will identify a new coping skill of thinking positive thoughts.    Affect/Mood: N/A   Participation Level: Did not attend    Clinical Observations/Individualized Feedback: Patient did not attend group.    Plan: Continue to engage patient in RT group sessions 2-3x/week.   Rosina Lowenstein, LRT, CTRS 07/17/2023 4:49 PM

## 2023-07-17 NOTE — Progress Notes (Signed)
   07/17/23 1000  Psych Admission Type (Psych Patients Only)  Admission Status Involuntary  Psychosocial Assessment  Patient Complaints Anxiety  Eye Contact Fair  Facial Expression Animated;Anxious  Affect Preoccupied  Speech Logical/coherent  Interaction Assertive  Motor Activity Slow  Appearance/Hygiene In scrubs  Behavior Characteristics Cooperative  Mood Pleasant  Thought Process  Coherency WDL  Content Preoccupation  Delusions None reported or observed  Perception WDL  Hallucination None reported or observed  Judgment WDL  Confusion None  Danger to Self  Current suicidal ideation? Denies  Danger to Others  Danger to Others None reported or observed

## 2023-07-17 NOTE — Progress Notes (Signed)
Patient verbalized her anger because she has to "find a job, find a place to stay, figure out if I want to stay here or stay with my sister but I dont want to because its dirty. Nothing is going to help me by being here, I am tired of having to keep looking for job." Patient speech is rapid and pressure. Encouragement and support given by this RN and MHT Brittney.

## 2023-07-17 NOTE — [facility] Suicide Risk Assessment (Signed)
[facility] INPATIENT:  Family/Significant Other Suicide Prevention Education  Suicide Prevention Education:  Contact Attempts: Shonte Brignac, sister, [phone] been identified by the patient as the family member/significant other with whom the patient will be residing, and identified as the person(s) who will aid the patient in the event of a mental health crisis.  With written consent from the patient, two attempts were made to provide suicide prevention education, prior to and/or following the patient's discharge.  We were unsuccessful in providing suicide prevention education.  A suicide education pamphlet was given to the patient to share with family/significant other.  Date and time of first attempt: 07/17/2023 at 3:40PM Date and time of second attempt: Second attempt is needed.   CSW left HIPAA compliant voicemail.  Harden Mo 07/17/2023, 3:39 PM

## 2023-07-17 NOTE — Group Note (Signed)
Date:  07/17/2023 Time:  12:51 PM  Group Topic/Focus:  Self Care:   The focus of this group is to help patients understand the importance of self-care in order to improve or restore emotional, physical, spiritual, interpersonal, and financial health.    Participation Level:  Active  Participation Quality:  Appropriate  Affect:  Appropriate  Cognitive:  Appropriate  Insight: Appropriate  Engagement in Group:  Engaged  Modes of Intervention:  Discussion   Ardelle Anton 07/17/2023, 12:51 PM

## 2023-07-17 NOTE — Progress Notes (Signed)
[facility] MD Progress Note  07/17/2023 12:05 PM Morgan [name]  MRN:  [id number]  Patient is a 58 year old white female with history of depression and bipolar disorder who presented to the emergency room with homicidal ideation against her work client.  Patient was admitted to geropsych unit at [facility] for further stabilization.  Patient is currently on IVC.  Patient is her own guardian. Subjective:  Chart reviewed, case discussed in multidisciplinary meeting, patient seen during rounds.  Patient is noted to be irritable.  She reports having a rough night last night.  Per nursing report patient had a phone call with her mother and had some argument.  After the phone call patient slammed the phone and disconnected.  She started pacing on the unit.  Patient was very agitated and received as needed Haldol and Vistaril.  Today patient reports that her mom has dementia but during the conversation her mom expressed her frustration about patient's unstable mood and living conditions.  Patient reportedly became very upset upon the conversation.  Patient did acknowledge that she became very angry agitated, slamming the phone.  Patient continues to lacks insight into her mental health issues.  Patient keeps repeating the question of why she needs to be in the hospital and her life is waiting for her.  Provider educated about her mood lability and the way she responds to stressors by making homicidal threats or getting agitated.  Patient reports not taking her Trileptal today morning as she seems to feel that it did not help last night.  Provider educated patient on the mechanism of action of Trileptal and the need for the Trileptal to build up in his system.  Patient and provider discussed on changing Trileptal to Abilify for quicker mood stabilization.  Patient agreed to try Abilify 5 mg nightly.  Patient continues to argue about her discharge.  Patient denies SI/HI/plan.  Patient denies auditory/visual  hallucinations. Principal Problem: Major depressive disorder, recurrent severe without psychotic features (HCC)    Past Psychiatric History: see h&P Family History:  Family History  Problem Relation Age of Onset   Dementia Mother    Asthma Mother    Multiple sclerosis Father    Uterine cancer Sister 57   Diabetes Brother    Diabetes Brother    Basal cell carcinoma Brother        lower lip   Diabetes Maternal Grandfather    Colon polyps Paternal Grandmother    Stomach cancer Paternal Grandmother        dx 20s-80s   Colon polyps Paternal Grandfather    Arthritis Other    Prostate cancer Other        grandfather per pt but she is unaware of which grandfather   Coronary artery disease Other    Pancreatic cancer Neg Hx    Esophageal cancer Neg Hx    Rectal cancer Neg Hx    Social History:  Social History   Substance and Sexual Activity  Alcohol Use No   Alcohol/week: 0.0 standard drinks of alcohol     Social History   Substance and Sexual Activity  Drug Use No    Social History   Socioeconomic History   Marital status: Single    Spouse name: Not on file   Number of children: Not on file   Years of education: Not on file   Highest education level: Not on file  Occupational History   Occupation: billing     Employer: [employer]  Tobacco Use  Smoking status: Some Days    Types: Cigarettes   Smokeless tobacco: Never   Tobacco comments:    Patient states she smokes about 1-2 cigarettes a day some days.  Updated 03/23/2023. am  Vaping Use   Vaping status: Never Used  Substance and Sexual Activity   Alcohol use: No    Alcohol/week: 0.0 standard drinks of alcohol   Drug use: No   Sexual activity: Yes    Birth control/protection: Surgical  Other Topics Concern   Not on file  Social History Narrative   Originally from [place], [place], & moved to [place] at 58 y.o. She reports she started a new job in July 2016. 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 No pets currently. No bird, mold, or hot tub exposure.       Right Handed    Lives in a one story home    Social Drivers of Health   Financial Resource Strain: Not on file  Food Insecurity: No Food Insecurity (07/14/2023)   Hunger Vital Sign    Worried About Running Out of Food in the Last Year: Never true    Ran Out of Food in the Last Year: Never true  Transportation Needs: No Transportation Needs (07/14/2023)   PRAPARE - Administrator, Civil Service (Medical): No    Lack of Transportation (Non-Medical): No  Physical Activity: Not on file  Stress: Stress Concern Present (04/27/2023)   [employer] of Occupational Health - Occupational Stress Questionnaire    Feeling of Stress : Very much  Social Connections: Moderately Integrated (07/14/2023)   Social Connection and Isolation Panel [NHANES]    Frequency of Communication with Friends and Family: Twice a week    Frequency of Social Gatherings with Friends and Family: Once a week    Attends Religious Services: More than 4 times per year    Active Member of Clubs or Organizations: Yes    Attends Engineer, structural: More than 4 times per year    Marital Status: Never married   Past Medical History:  Past Medical History:  Diagnosis Date   Anal fissure    saw Dr. Loretha Brasil    Anxiety    on meds   Arthritis    on meds   Asthma    uses inhaler if needed   Carpal tunnel syndrome    Depression    on meds   Diabetes mellitus    on meds   GERD (gastroesophageal reflux disease)    on meds   History of ovarian cancer in adulthood    Hyperlipidemia    on meds   Hypertension    on meds   Ovarian cancer [facility]) 1986   1996   Skin cancer (melanoma) (HCC) 1997   Sleep apnea    uses CPAP    Tubular adenoma of colon 01/2014    Past Surgical  History:  Procedure Laterality Date   ABDOMINAL HYSTERECTOMY  06/30/1984   BSO   BASAL CELL CARCINOMA EXCISION  10/09/2012   lower lip per Dr. Park Liter    COLONOSCOPY  03/23/2019   per Dr. Adela Lank, adenomatous polyps, repeat in 3 yrs   GLBS tumor ovary 1986     HEMORRHOID SURGERY     HIP FRACTURE SURGERY Right 2007   MELANOMA EXCISION  07/01/1995   upper chest     Sleep: Poor  Appetite:  Fair  Current Medications: Current Facility-Administered Medications  Medication Dose Route Frequency Provider Last Rate Last Admin   acetaminophen (TYLENOL) tablet 650 mg  650 mg Oral Q6H PRN Onuoha, Josephine C, NP       albuterol (PROVENTIL) (2.5 MG/3ML) 0.083% nebulizer solution 3 mL  3 mL Inhalation Q6H PRN Earney Navy, NP       ALPRAZolam Prudy Feeler) tablet 0.5 mg  0.5 mg Oral BID PRN Dahlia Byes C, NP   0.5 mg at 07/16/23 2119   alum & mag hydroxide-simeth (MAALOX/MYLANTA) 200-200-20 MG/5ML suspension 30 mL  30 mL Oral Q4H PRN Dahlia Byes C, NP   30 mL at 07/16/23 1717   atorvastatin (LIPITOR) tablet 80 mg  80 mg Oral Daily Dahlia Byes C, NP   80 mg at 07/17/23 2841   diphenhydrAMINE (BENADRYL) capsule 50 mg  50 mg Oral Q6H PRN Dahlia Byes C, NP       haloperidol (HALDOL) tablet 5 mg  5 mg Oral TID PRN Dahlia Byes C, NP   5 mg at 07/17/23 0016   And   diphenhydrAMINE (BENADRYL) capsule 50 mg  50 mg Oral TID PRN Dahlia Byes C, NP       haloperidol lactate (HALDOL) injection 10 mg  10 mg Intramuscular TID PRN Dahlia Byes C, NP       And   diphenhydrAMINE (BENADRYL) injection 50 mg  50 mg Intramuscular TID PRN Dahlia Byes C, NP       And   LORazepam (ATIVAN) injection 2 mg  2 mg Intramuscular TID PRN Dahlia Byes C, NP       haloperidol lactate (HALDOL) injection 5 mg  5 mg Intramuscular TID PRN Dahlia Byes C, NP       And   diphenhydrAMINE (BENADRYL) injection 50 mg  50 mg Intramuscular TID PRN Dahlia Byes C, NP        And   LORazepam (ATIVAN) injection 2 mg  2 mg Intramuscular TID PRN Dahlia Byes C, NP       glimepiride (AMARYL) tablet 1 mg  1 mg Oral Q breakfast Onuoha, Josephine C, NP   1 mg at 07/17/23 3244   hydrochlorothiazide (HYDRODIURIL) tablet 12.5 mg  12.5 mg Oral Daily Dahlia Byes C, NP   12.5 mg at 07/17/23 0102   hydrOXYzine (ATARAX) tablet 25 mg  25 mg Oral TID PRN Dahlia Byes C, NP   25 mg at 07/16/23 2304   losartan (COZAAR) tablet 50 mg  50 mg Oral Daily Dahlia Byes C, NP   50 mg at 07/17/23 7253   magnesium hydroxide (MILK OF MAGNESIA) suspension 30 mL  30 mL Oral Daily PRN Dahlia Byes C, NP       metFORMIN (GLUCOPHAGE-XR) 24 hr tablet 500 mg  500 mg Oral Q breakfast Verner Chol, MD   500 mg at 07/17/23 6644   nicotine (NICODERM CQ - dosed in mg/24 hours) patch 14 mg  14 mg Transdermal Q0600 Dahlia Byes C, NP       nortriptyline (PAMELOR) capsule 10 mg  10 mg Oral QHS Onuoha, Josephine C, NP   10 mg at 07/16/23 2119   OXcarbazepine (TRILEPTAL) tablet 150 mg  150 mg Oral BID Verner Chol, MD   150 mg at 07/16/23 2120   pantoprazole (PROTONIX) EC tablet 20 mg  20 mg Oral BID Dahlia Byes C, NP   20 mg at 07/17/23  3295   venlafaxine XR (EFFEXOR-XR) 24 hr capsule 150 mg  150 mg Oral Daily Dahlia Byes C, NP   150 mg at 07/17/23 1884    Lab Results:  Results for orders placed or performed during the hospital encounter of 07/14/23 (from the past 48 hours)  Glucose, capillary     Status: Abnormal   Collection Time: 07/15/23  4:15 PM  Result Value Ref Range   Glucose-Capillary 178 (H) 70 - 99 mg/dL    Comment: Glucose reference range applies only to samples taken after fasting for at least 8 hours.  Glucose, capillary     Status: Abnormal   Collection Time: 07/15/23  7:44 PM  Result Value Ref Range   Glucose-Capillary 243 (H) 70 - 99 mg/dL    Comment: Glucose reference range applies only to samples taken after fasting for at least 8 hours.   Glucose, capillary     Status: Abnormal   Collection Time: 07/16/23  7:42 AM  Result Value Ref Range   Glucose-Capillary 213 (H) 70 - 99 mg/dL    Comment: Glucose reference range applies only to samples taken after fasting for at least 8 hours.  Glucose, capillary     Status: Abnormal   Collection Time: 07/16/23  3:05 PM  Result Value Ref Range   Glucose-Capillary 238 (H) 70 - 99 mg/dL    Comment: Glucose reference range applies only to samples taken after fasting for at least 8 hours.  Glucose, capillary     Status: Abnormal   Collection Time: 07/17/23  7:28 AM  Result Value Ref Range   Glucose-Capillary 182 (H) 70 - 99 mg/dL    Comment: Glucose reference range applies only to samples taken after fasting for at least 8 hours.   *Note: Due to a large number of results and/or encounters for the requested time period, some results have not been displayed. A complete set of results can be found in Results Review.    Blood Alcohol level:  Lab Results  Component Value Date   ETH <10 07/14/2023   ETH <10 12/29/2022    Metabolic Disorder Labs: Lab Results  Component Value Date   HGBA1C 7.7 (H) 07/13/2023   MPG 174.29 07/13/2023   MPG 122.63 12/29/2022   No results found for: "PROLACTIN" Lab Results  Component Value Date   CHOL 158 07/13/2023   TRIG 298 (H) 07/13/2023   HDL 41 07/13/2023   CHOLHDL 3.9 07/13/2023   VLDL 60 (H) 07/13/2023   LDLCALC 57 07/13/2023   LDLCALC 65 12/29/2022     Psychiatric Specialty Exam:  Presentation  General Appearance:  Appropriate for Environment  Eye Contact: Fleeting  Speech: Pressured  Speech Volume: Increased  Handedness: Right   Mood and Affect  Mood: Irritable; Labile  Affect: Labile   Thought Process  Thought Processes: Goal Directed  Descriptions of Associations:Intact  Orientation:Full (Time, Place and Person)  Thought Content:Illogical; Rumination  History of Schizophrenia/Schizoaffective  disorder:No  Duration of Psychotic Symptoms:No data recorded Hallucinations:Hallucinations: None  Ideas of Reference:None  Suicidal Thoughts:Suicidal Thoughts: No  Homicidal Thoughts:Homicidal Thoughts: No   Sensorium  Memory: Immediate Fair; Remote Fair; Recent Fair  Judgment: Impaired  Insight: Shallow   Executive Functions  Concentration: Poor  Attention Span: Poor  Recall: [employer] of Knowledge: Fair  Language: Fair   Psychomotor Activity  Psychomotor Activity: Psychomotor Activity: Normal  Musculoskeletal: Strength & Muscle Tone: within normal limits Gait & Station: normal Assets  Assets: Manufacturing systems engineer; Desire for Improvement;  Resilience    Physical Exam: Physical Exam Vitals and nursing note reviewed.  HENT:     Right Ear: Tympanic membrane normal.     Nose: Nose normal.     Mouth/Throat:     Mouth: Mucous membranes are moist.  Eyes:     Pupils: Pupils are equal, round, and reactive to light.  Cardiovascular:     Rate and Rhythm: Normal rate.     Pulses: Normal pulses.  Pulmonary:     Effort: Pulmonary effort is normal.  Abdominal:     General: Bowel sounds are normal.  Musculoskeletal:        General: Normal range of motion.  Skin:    General: Skin is warm.  Neurological:     General: No focal deficit present.     Mental Status: She is alert.    ROS Blood pressure 114/78, pulse 99, temperature (!) 97.4 F (36.3 C), resp. rate 18, height 5\' 4"  (1.626 m), weight 108 kg, last menstrual period 03/22/1986, SpO2 97%. Body mass index is 40.85 kg/m.  Diagnosis: Principal Problem:   Major depressive disorder, recurrent severe without psychotic features [facility])  Treatment Plan Summary: Daily contact with patient to assess and evaluate symptoms and progress in treatment Patient is admitted to locked unit under safety precautions 2.  Patient has been started on medications including  Continue Effexor XR 150 mg daily,  nortriptyline 10 mg nightly, Xanax 0.5 mg twice daily as needed anxiety.   discontinue Trileptal 150 mg twice daily for further mood stabilization. -patient declining the med Start Abilify 5 mg at bedtime for mood stabilization- discussed the side effects of EPS, cardiac issues, metabolic side effects.  Patient gave consent Recommend to participate in the group and milieu therapy Patient is on every 15 checks 3.  Patient was encouraged to attend group and work on coping strategies 4.  Social worker consulted to get collateral and help with a safe discharge plan Verner Chol, MD 07/17/2023, 12:05 PM

## 2023-07-17 NOTE — Progress Notes (Signed)
Post agitation medication follow up:   Patient is responding to the medication appropriately. Patient appears to be asleep at this time. Chest rise and fall noted. Breathing is equal. No distress noted. Q15 mins safety checks continued.    07/17/23 0115  15 Minute Checks  Location Bedroom  Visual Appearance Calm  Behavior Sleeping

## 2023-07-17 NOTE — Group Note (Signed)
Date:  07/16/2023 Time:  8:00 PM  Group Topic/Focus:  Identifying Needs:   The focus of this group is to help patients identify their personal needs that have been historically problematic and identify healthy behaviors to address their needs.    Participation Level:  Active  Participation Quality:  Appropriate  Affect:  Appropriate  Cognitive:  Appropriate  Insight: Appropriate  Engagement in Group:  Engaged  Modes of Intervention:  Education  Additional Comments:    Garry Heater 07/16/2023, 8:00 PM

## 2023-07-17 NOTE — Progress Notes (Signed)
Patient is pacing up and down the hallways at this time

## 2023-07-17 NOTE — Plan of Care (Signed)
  Problem: Activity: Goal: Interest or engagement in leisure activities will improve Outcome: Progressing Goal: Imbalance in normal sleep/wake cycle will improve Outcome: Progressing   

## 2023-07-17 NOTE — Progress Notes (Signed)
   07/17/23 0600  15 Minute Checks  Location Bedroom  Visual Appearance Calm  Behavior Sleeping  Sleep (Behavioral Health Patients Only)  Calculate sleep? (Click Yes once per 24 hr at 0600 safety check) Yes  Documented sleep last 24 hours 6.75

## 2023-07-18 NOTE — Plan of Care (Signed)
D: Pt alert and oriented. Pt denies experiencing any anxiety/depression at this time. Pt denies experiencing any pain at this time. Pt denies experiencing any SI/HI, or AVH at this time.   A: Scheduled medications administered to pt, per MD orders. Support and encouragement provided. Frequent verbal contact made. Routine safety checks conducted q15 minutes.   R: No adverse drug reactions noted. Pt verbally contracts for safety at this time. Pt compliant with medications. Pt interacts well with others on the unit. Pt remains safe at this time. Plan of care ongoing.  Pt requested her lipitor be moved to bedtime because that is when she takes it a home. MD made aware, medication time was change. Pt also reported that she does not want a nicotine patch while admitted, MD was also notified and made aware of this. The nicotine patch was then discontinued.   Problem: Health Behavior/Discharge Planning: Goal: Compliance with therapeutic regimen will improve Outcome: Progressing   Problem: Self-Concept: Goal: Level of anxiety will decrease Outcome: Progressing

## 2023-07-18 NOTE — Plan of Care (Signed)
  Problem: Education: Goal: Utilization of techniques to improve thought processes will improve Outcome: Progressing Goal: Knowledge of the prescribed therapeutic regimen will improve Outcome: Progressing   Problem: Activity: Goal: Interest or engagement in leisure activities will improve Outcome: Progressing Goal: Imbalance in normal sleep/wake cycle will improve Outcome: Progressing   Problem: Coping: Goal: Coping ability will improve Outcome: Progressing Goal: Will verbalize feelings Outcome: Progressing   Problem: Health Behavior/Discharge Planning: Goal: Ability to make decisions will improve Outcome: Progressing Goal: Compliance with therapeutic regimen will improve Outcome: Progressing   Problem: Coping: Goal: Coping ability will improve Outcome: Progressing Goal: Will verbalize feelings Outcome: Progressing   Problem: Role Relationship: Goal: Will demonstrate positive changes in social behaviors and relationships Outcome: Progressing   Problem: Safety: Goal: Ability to disclose and discuss suicidal ideas will improve Outcome: Progressing Goal: Ability to identify and utilize support systems that promote safety will improve Outcome: Progressing   Problem: Self-Concept: Goal: Will verbalize positive feelings about self Outcome: Progressing Goal: Level of anxiety will decrease Outcome: Progressing

## 2023-07-18 NOTE — Progress Notes (Signed)
[facility] MD Progress Note  07/18/2023 3:11 PM Morgan [name]  MRN:  [id number]  Patient is a 58 year old white female with history of depression and bipolar disorder who presented to the emergency room with homicidal ideation against her work client.  Patient was admitted to geropsych unit at [facility] for further stabilization.  Patient is currently on IVC.  Patient is her own guardian. Subjective:  Chart reviewed, case discussed in multidisciplinary meeting, patient seen during rounds.  Today on interview patient is noted to be sitting in the dayroom.  She was interviewed alone in her room.  Patient reports that she did not receive Abilify last night.  Provider checked the chart and patient was given Abilify.  Patient reports that she is feeling much calmer today she is not feeling anxious and also states that Vistaril is helping her with her anxiety.  She denies having any racing thoughts.  No episodes of aggression where reported by the nurse.  She denies SI/HI/plan.  She denies auditory/visual hallucinations.  She denies any sign of extra medications including EPS.  She is remains focused on discharge on Monday.    Principal Problem: Major depressive disorder, recurrent severe without psychotic features (HCC)    Past Psychiatric History: see h&P Family History:  Family History  Problem Relation Age of Onset   Dementia Mother    Asthma Mother    Multiple sclerosis Father    Uterine cancer Sister 77   Diabetes Brother    Diabetes Brother    Basal cell carcinoma Brother        lower lip   Diabetes Maternal Grandfather    Colon polyps Paternal Grandmother    Stomach cancer Paternal Grandmother        dx 80s-80s   Colon polyps Paternal Grandfather    Arthritis Other    Prostate cancer Other        grandfather per pt but she is unaware of which grandfather   Coronary artery disease Other    Pancreatic cancer Neg Hx    Esophageal cancer Neg Hx    Rectal cancer Neg Hx    Social  History:  Social History   Substance and Sexual Activity  Alcohol Use No   Alcohol/week: 0.0 standard drinks of alcohol     Social History   Substance and Sexual Activity  Drug Use No    Social History   Socioeconomic History   Marital status: Single    Spouse name: Not on file   Number of children: Not on file   Years of education: Not on file   Highest education level: Not on file  Occupational History   Occupation: billing     Employer: [employer]  Tobacco Use   Smoking status: Some Days    Types: Cigarettes   Smokeless tobacco: Never   Tobacco comments:    Patient states she smokes about 1-2 cigarettes a day some days.  Updated 03/23/2023. am  Vaping Use   Vaping status: Never Used  Substance and Sexual Activity   Alcohol use: No    Alcohol/week: 0.0 standard drinks of alcohol   Drug use: No   Sexual activity: Yes    Birth control/protection: Surgical  Other Topics Concern   Not on file  Social History Narrative   Originally from [place], [place], & moved to [place] at 58 y.o. She reports she started a new job in July 2016. 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 No pets currently. No bird, mold, or hot tub exposure.       Right Handed    Lives in a one story home    Social Drivers of Health   Financial Resource Strain: Not on file  Food Insecurity: No Food Insecurity (07/14/2023)   Hunger Vital Sign    Worried About Running Out of Food in the Last Year: Never true    Ran Out of Food in the Last Year: Never true  Transportation Needs: No Transportation Needs (07/14/2023)   PRAPARE - Administrator, Civil Service (Medical): No    Lack of Transportation (Non-Medical): No  Physical Activity: Not on file  Stress: Stress Concern Present (04/27/2023)   [employer] of Occupational Health -  Occupational Stress Questionnaire    Feeling of Stress : Very much  Social Connections: Moderately Integrated (07/14/2023)   Social Connection and Isolation Panel [NHANES]    Frequency of Communication with Friends and Family: Twice a week    Frequency of Social Gatherings with Friends and Family: Once a week    Attends Religious Services: More than 4 times per year    Active Member of Clubs or Organizations: Yes    Attends Engineer, structural: More than 4 times per year    Marital Status: Never married   Past Medical History:  Past Medical History:  Diagnosis Date   Anal fissure    saw Dr. Loretha Brasil    Anxiety    on meds   Arthritis    on meds   Asthma    uses inhaler if needed   Carpal tunnel syndrome    Depression    on meds   Diabetes mellitus    on meds   GERD (gastroesophageal reflux disease)    on meds   History of ovarian cancer in adulthood    Hyperlipidemia    on meds   Hypertension    on meds   Ovarian cancer [facility]) 1986   1996   Skin cancer (melanoma) (HCC) 1997   Sleep apnea    uses CPAP    Tubular adenoma of colon 01/2014    Past Surgical History:  Procedure Laterality Date   ABDOMINAL HYSTERECTOMY  06/30/1984   BSO   BASAL CELL CARCINOMA EXCISION  10/09/2012   lower lip per Dr. Park Liter    COLONOSCOPY  03/23/2019   per Dr. Adela Lank, adenomatous polyps, repeat in 3 yrs   GLBS tumor ovary 1986     HEMORRHOID SURGERY     HIP FRACTURE SURGERY Right 2007   MELANOMA EXCISION  07/01/1995   upper chest     Sleep: Poor  Appetite:  Fair  Current Medications: Current Facility-Administered Medications  Medication Dose Route Frequency Provider Last Rate Last Admin   acetaminophen (TYLENOL) tablet 650 mg  650 mg Oral Q6H PRN Onuoha, Josephine C, NP       albuterol (PROVENTIL) (2.5 MG/3ML) 0.083% nebulizer solution 3 mL  3 mL Inhalation Q6H PRN Earney Navy, NP       ALPRAZolam Prudy Feeler) tablet 0.5 mg  0.5 mg Oral BID PRN Dahlia Byes C, NP   0.5 mg at 07/17/23 2141   alum & mag hydroxide-simeth (MAALOX/MYLANTA) 200-200-20 MG/5ML suspension 30 mL  30 mL Oral Q4H PRN Dahlia Byes C, NP   30 mL at 07/16/23 1717  ARIPiprazole (ABILIFY) tablet 5 mg  5 mg Oral QHS Verner Chol, MD   5 mg at 07/17/23 2105   atorvastatin (LIPITOR) tablet 80 mg  80 mg Oral Daily Dahlia Byes C, NP   80 mg at 07/17/23 2725   diphenhydrAMINE (BENADRYL) capsule 50 mg  50 mg Oral Q6H PRN Dahlia Byes C, NP       haloperidol (HALDOL) tablet 5 mg  5 mg Oral TID PRN Dahlia Byes C, NP   5 mg at 07/17/23 0016   And   diphenhydrAMINE (BENADRYL) capsule 50 mg  50 mg Oral TID PRN Dahlia Byes C, NP       haloperidol lactate (HALDOL) injection 10 mg  10 mg Intramuscular TID PRN Dahlia Byes C, NP       And   diphenhydrAMINE (BENADRYL) injection 50 mg  50 mg Intramuscular TID PRN Dahlia Byes C, NP       And   LORazepam (ATIVAN) injection 2 mg  2 mg Intramuscular TID PRN Dahlia Byes C, NP       haloperidol lactate (HALDOL) injection 5 mg  5 mg Intramuscular TID PRN Dahlia Byes C, NP       And   diphenhydrAMINE (BENADRYL) injection 50 mg  50 mg Intramuscular TID PRN Dahlia Byes C, NP       And   LORazepam (ATIVAN) injection 2 mg  2 mg Intramuscular TID PRN Dahlia Byes C, NP       glimepiride (AMARYL) tablet 1 mg  1 mg Oral Q breakfast Onuoha, Josephine C, NP   1 mg at 07/18/23 0805   hydrochlorothiazide (HYDRODIURIL) tablet 12.5 mg  12.5 mg Oral Daily Dahlia Byes C, NP   12.5 mg at 07/18/23 3664   hydrOXYzine (ATARAX) tablet 25 mg  25 mg Oral TID PRN Dahlia Byes C, NP   25 mg at 07/16/23 2304   losartan (COZAAR) tablet 50 mg  50 mg Oral Daily Dahlia Byes C, NP   50 mg at 07/18/23 4034   magnesium hydroxide (MILK OF MAGNESIA) suspension 30 mL  30 mL Oral Daily PRN Dahlia Byes C, NP       metFORMIN (GLUCOPHAGE-XR) 24 hr tablet 500 mg  500 mg Oral Q breakfast Verner Chol, MD   500 mg at 07/18/23 0805   nicotine (NICODERM CQ - dosed in mg/24 hours) patch 14 mg  14 mg Transdermal Q0600 Dahlia Byes C, NP       nortriptyline (PAMELOR) capsule 10 mg  10 mg Oral QHS Onuoha, Josephine C, NP   10 mg at 07/17/23 2105   pantoprazole (PROTONIX) EC tablet 20 mg  20 mg Oral BID Dahlia Byes C, NP   20 mg at 07/18/23 7425   venlafaxine XR (EFFEXOR-XR) 24 hr capsule 150 mg  150 mg Oral Daily Dahlia Byes C, NP   150 mg at 07/18/23 9563    Lab Results:  Results for orders placed or performed during the hospital encounter of 07/14/23 (from the past 48 hours)  Glucose, capillary     Status: Abnormal   Collection Time: 07/17/23  7:28 AM  Result Value Ref Range   Glucose-Capillary 182 (H) 70 - 99 mg/dL    Comment: Glucose reference range applies only to samples taken after fasting for at least 8 hours.   *Note: Due to a large number of results and/or encounters for the requested time period, some results have not been displayed. A complete set of results can be found in  Results Review.    Blood Alcohol level:  Lab Results  Component Value Date   ETH <10 07/14/2023   ETH <10 12/29/2022    Metabolic Disorder Labs: Lab Results  Component Value Date   HGBA1C 7.7 (H) 07/13/2023   MPG 174.29 07/13/2023   MPG 122.63 12/29/2022   No results found for: "PROLACTIN" Lab Results  Component Value Date   CHOL 158 07/13/2023   TRIG 298 (H) 07/13/2023   HDL 41 07/13/2023   CHOLHDL 3.9 07/13/2023   VLDL 60 (H) 07/13/2023   LDLCALC 57 07/13/2023   LDLCALC 65 12/29/2022     Psychiatric Specialty Exam:  Presentation  General Appearance:  Appropriate for Environment; Casual  Eye Contact: Fair  Speech: Clear and Coherent  Speech Volume: Normal  Handedness: Right   Mood and Affect  Mood: Anxious  Affect: Appropriate   Thought Process  Thought Processes: Coherent  Descriptions of Associations:Intact  Orientation:Full (Time,  Place and Person)  Thought Content:Logical  History of Schizophrenia/Schizoaffective disorder:No  Duration of Psychotic Symptoms:No data recorded Hallucinations:Hallucinations: None  Ideas of Reference:None  Suicidal Thoughts:Suicidal Thoughts: No  Homicidal Thoughts:Homicidal Thoughts: No   Sensorium  Memory: Immediate Fair; Recent Fair; Remote Fair  Judgment: Impaired  Insight: Shallow   Executive Functions  Concentration: Poor  Attention Span: Fair  Recall: [employer] of Knowledge: Fair  Language: Fair   Psychomotor Activity  Psychomotor Activity: Psychomotor Activity: Restlessness  Musculoskeletal: Strength & Muscle Tone: within normal limits Gait & Station: normal Assets  Assets: Manufacturing systems engineer; Desire for Improvement; Resilience    Physical Exam: Physical Exam Vitals and nursing note reviewed.  HENT:     Right Ear: Tympanic membrane normal.     Nose: Nose normal.     Mouth/Throat:     Mouth: Mucous membranes are moist.  Eyes:     Pupils: Pupils are equal, round, and reactive to light.  Cardiovascular:     Rate and Rhythm: Normal rate.     Pulses: Normal pulses.  Pulmonary:     Effort: Pulmonary effort is normal.  Abdominal:     General: Bowel sounds are normal.  Musculoskeletal:        General: Normal range of motion.  Skin:    General: Skin is warm.  Neurological:     General: No focal deficit present.     Mental Status: She is alert.    ROS Blood pressure 134/89, pulse (!) 102, temperature (!) 97.1 F (36.2 C), resp. rate 14, height 5\' 4"  (1.626 m), weight 108 kg, last menstrual period 03/22/1986, SpO2 99%. Body mass index is 40.85 kg/m.  Diagnosis: Principal Problem:   Major depressive disorder, recurrent severe without psychotic features [facility])  Treatment Plan Summary: Daily contact with patient to assess and evaluate symptoms and progress in treatment Patient is admitted to locked unit under safety  precautions 2.  Patient has been started on medications including  Continue Effexor XR 150 mg daily, nortriptyline 10 mg nightly, Xanax 0.5 mg twice daily as needed anxiety.   Discontinued on 07/17/23-Trileptal 150 mg twice daily for further mood stabilization. -patient declining the med Abilify 5 mg at bedtime for mood stabilization- discussed the side effects of EPS, cardiac issues, metabolic side effects.  Patient gave consent Recommend to participate in the group and milieu therapy Patient is on every 15 checks 3.  Patient was encouraged to attend group and work on coping strategies 4.  Social worker consulted to get collateral and help with a safe  discharge plan Verner Chol, MD 07/18/2023, 3:11 PM

## 2023-07-18 NOTE — Progress Notes (Signed)
Pt present and cooperative on approach, she c/o anxiety and feeling frustrated after not been discharged Friday. Pt also reported sleeping throughout the day after receiving Vistaril last night and requested not to receive it again. Pt received Xanax 0.5 mg which was effective and pt without further complaints. Pt is been maintained on q 15 min rounds.  07/17/23 2130  Psych Admission Type (Psych Patients Only)  Admission Status Involuntary  Psychosocial Assessment  Patient Complaints Anxiety  Eye Contact Fair  Facial Expression Animated  Affect Preoccupied  Speech Logical/coherent  Interaction Assertive  Motor Activity Slow  Appearance/Hygiene In scrubs  Behavior Characteristics Cooperative  Mood Pleasant  Thought Process  Coherency WDL  Content Preoccupation  Delusions None reported or observed  Perception WDL  Hallucination None reported or observed  Judgment WDL  Confusion None  Danger to Self  Current suicidal ideation? Denies  Danger to Others  Danger to Others None reported or observed

## 2023-07-18 NOTE — Hospital Course (Signed)
Patient is a 58 year old white female with history of depression and bipolar disorder who presented to the emergency room with homicidal ideation against her work client. Patient was admitted to geropsych unit at [facility] for further stabilization. Patient is admitted on IVC. Patient is her own guardian.  Patient patient's home medication Effexor XL 150 mg, nortriptyline 10 mg nightly and Xanax 0.5 mg twice daily as needed were restarted.  Patient had an episode of agitation and racing thoughts.  Provider discussed the need for mood stabilizer.  Initially patient agreed to try Trileptal 150 mg twice daily.  After 1 dose patient informed the provider that it is of no help to her.  She refused to take the next dose.  Then provider discussed Abilify for mood stabilization.  Patient eventually agreed to try Abilify 5 mg nightly for mood stabilization.  Patient tolerated the medication with no problems.  During the inpatient stay patient did participate in groups and milieu therapy.

## 2023-07-18 NOTE — Group Note (Signed)
Date:  07/18/2023 Time:  10:33 AM  Group Topic/Focus:  Gratitude Group: The purpose of this group is to have patients work together on worksheets that ask what they are grateful for in their lives.      Participation Level:  Active  Participation Quality:  Appropriate  Affect:  Appropriate  Cognitive:  Alert and Appropriate  Insight: Appropriate  Engagement in Group:  Engaged  Modes of Intervention:  Discussion  Additional Comments:    Marta Antu 07/18/2023, 10:33 AM

## 2023-07-18 NOTE — Group Note (Signed)
Date:  07/18/2023 Time:  8:42 PM  Group Topic/Focus:  Overcoming Stress:   The focus of this group is to define stress and help patients assess their triggers.    Participation Level:  Active  Participation Quality:  Appropriate  Affect:  Appropriate  Cognitive:  Appropriate  Insight: Appropriate  Engagement in Group:  Engaged  Modes of Intervention:  Education  Additional Comments:    Garry Heater 07/18/2023, 8:42 PM

## 2023-07-19 NOTE — Group Note (Signed)
LCSW Group Therapy Note  Group Date: 07/19/2023 Start Time: 1400 End Time: 1445   Type of Therapy and Topic:  Group Therapy - Healthy vs Unhealthy Coping Skills  Participation Level:  Active   Description of Group The focus of this group was to determine what unhealthy coping techniques typically are used by group members and what healthy coping techniques would be helpful in coping with various problems. Patients were guided in becoming aware of the differences between healthy and unhealthy coping techniques. Patients were asked to identify 2-3 healthy coping skills they would like to learn to use more effectively.  Therapeutic Goals Patients learned that coping is what human beings do all day long to deal with various situations in their lives Patients defined and discussed healthy vs unhealthy coping techniques Patients identified their preferred coping techniques and identified whether these were healthy or unhealthy Patients determined 2-3 healthy coping skills they would like to become more familiar with and use more often. Patients provided support and ideas to each other   Summary of Patient Progress:  During group, Briana expressed coping skills she plans on using once she is discharged. Patient proved open to input from peers and feedback from CSW. Patient demonstrated realistic insight into the subject matter, was respectful of peers, and participated throughout the entire session.   Therapeutic Modalities Cognitive Behavioral Therapy Motivational Interviewing  Alanda Amass 07/19/2023  3:17 PM

## 2023-07-19 NOTE — Group Note (Signed)
Date:  07/19/2023 Time:  3:19 PM  Group Topic/Focus:  Movement therapy    Participation Level:  Did Not Attend    Rodena Goldmann 07/19/2023, 3:19 PM

## 2023-07-19 NOTE — Plan of Care (Signed)
  Problem: Education: Goal: Utilization of techniques to improve thought processes will improve Outcome: Progressing Goal: Knowledge of the prescribed therapeutic regimen will improve Outcome: Progressing   Problem: Activity: Goal: Interest or engagement in leisure activities will improve Outcome: Progressing  Patient interacting well with Peers and Staff. Requested for HS Abilify Patient medications given and Patient advised that the medication is scheduled every night at bedtime. Patient educated on medications. Patient stated she is looking forward to discharge on Monday. Support and encouragement provided as needed.

## 2023-07-19 NOTE — Group Note (Signed)
Date:  07/19/2023 Time:  9:03 PM  Group Topic/Focus:  Wrap-Up Group:   The focus of this group is to help patients review their daily goal of treatment and discuss progress on daily workbooks.    Participation Level:  Active  Participation Quality:  Appropriate  Affect:  Appropriate  Cognitive:  Appropriate  Insight: Appropriate  Engagement in Group:  Engaged  Modes of Intervention:  Discussion    Lenore Cordia 07/19/2023, 9:03 PM

## 2023-07-19 NOTE — [facility] Suicide Risk Assessment (Signed)
[facility] INPATIENT:  Family/Significant Other Suicide Prevention Education  Suicide Prevention Education:  Contact Attempts:  Keyna Fallen, sister, [phone] and Clent Jacks, friend, [phone],  has been identified by the patient as the family member/significant other with whom the patient will be residing, and identified as the person(s) who will aid the patient in the event of a mental health crisis.  With written consent from the patient, two attempts were made to provide suicide prevention education, prior to and/or following the patient's discharge.  We were unsuccessful in providing suicide prevention education.  A suicide education pamphlet was given to the patient to share with family/significant other.  Date and time of first attempt:07/17/2023 at 3:40PM  Date and time of second attempt:07/19/2023 at 2:20 pm  Marshell Levan 07/19/2023, 2:21 PM

## 2023-07-19 NOTE — Plan of Care (Signed)
D: Pt alert and oriented. Pt denies experiencing any anxiety/depression at this time. Pt reports experiencing any pain at this time. Pt denies experiencing any SI/HI, or AVH at this time.   A: Scheduled medications administered to pt, per MD orders. Support and encouragement provided. Frequent verbal contact made. Routine safety checks conducted q15 minutes.   R: No adverse drug reactions noted. Pt verbally contracts for safety at this time. Pt compliant with medications and treatment plan. Pt interacts well with others on the unit. Pt remains safe at this time. Plan of care ongoing.  Pt has c/o racing HR at end of shift, VS taken and pt assessed. Pt denies experiencing any other symptoms except shaky hand. Pt asked if she maybe experiencing anxiety and offered prn medication. Pt stated possibly and held hand up to show shakiness and agreed to taking prn medication for anxiety (vistaril). Pt found it to be helpful and would like for it to be prescribed as a as needed medication for home use.   Problem: Coping: Goal: Will verbalize feelings Outcome: Not Progressing   Problem: Health Behavior/Discharge Planning: Goal: Compliance with therapeutic regimen will improve Outcome: Progressing

## 2023-07-19 NOTE — Group Note (Signed)
Date:  07/19/2023 Time:  5:24 PM  Group Topic/Focus:  Self Care:   The focus of this group is to help patients understand the importance of self-care in order to improve or restore emotional, physical, spiritual, interpersonal, and financial health.    Participation Level:  Active  Participation Quality:  Appropriate  Affect:  Appropriate  Cognitive:  Alert, Appropriate, and Oriented  Insight: Appropriate, Good, and Improving  Engagement in Group:  Engaged and Supportive  Modes of Intervention:  Activity  Additional Comments:     [name] 07/19/2023, 5:24 PM

## 2023-07-19 NOTE — Progress Notes (Signed)
[facility] MD Progress Note  07/19/2023 11:58 AM Morgan [name]  MRN:  [id number]  Patient is a 58 year old white female with history of depression and bipolar disorder who presented to the emergency room with homicidal ideation against her work client.  Patient was admitted to geropsych unit at [facility] for further stabilization.  Patient is currently on IVC.  Patient is her own guardian. Subjective:  Chart reviewed, case discussed in multidisciplinary meeting, patient seen during rounds.   On interview patient is noted to be in good mood.  She reports that she feels less agitated or restless.  She acknowledged that she did take her Abilify last 2 nights.  She is tolerating the medications very well.  She reports talking to her brother who also seems to not able to understand her situation but patient reports that she ended the phone conversation decently without yelling or screaming.  She denies any nightmares or flashbacks.  Patient is eager to get discharged on Monday.  Provider informed the patient that social worker was informed about possible discharge on Monday.  Patient has some appointments with her therapist for anger management on Tuesday and also she needs to work on getting a new job.  Patient remains future oriented.  She denies SI/HI/plan.  She denies auditory/visual hallucinations.   Principal Problem: Major depressive disorder, recurrent severe without psychotic features (HCC)    Past Psychiatric History: see h&P Family History:  Family History  Problem Relation Age of Onset   Dementia Mother    Asthma Mother    Multiple sclerosis Father    Uterine cancer Sister 47   Diabetes Brother    Diabetes Brother    Basal cell carcinoma Brother        lower lip   Diabetes Maternal Grandfather    Colon polyps Paternal Grandmother    Stomach cancer Paternal Grandmother        dx 72s-80s   Colon polyps Paternal Grandfather    Arthritis Other    Prostate cancer Other         grandfather per pt but she is unaware of which grandfather   Coronary artery disease Other    Pancreatic cancer Neg Hx    Esophageal cancer Neg Hx    Rectal cancer Neg Hx    Social History:  Social History   Substance and Sexual Activity  Alcohol Use No   Alcohol/week: 0.0 standard drinks of alcohol     Social History   Substance and Sexual Activity  Drug Use No    Social History   Socioeconomic History   Marital status: Single    Spouse name: Not on file   Number of children: Not on file   Years of education: Not on file   Highest education level: Not on file  Occupational History   Occupation: billing     Employer: [employer]  Tobacco Use   Smoking status: Some Days    Types: Cigarettes   Smokeless tobacco: Never   Tobacco comments:    Patient states she smokes about 1-2 cigarettes a day some days.  Updated 03/23/2023. am  Vaping Use   Vaping status: Never Used  Substance and Sexual Activity   Alcohol use: No    Alcohol/week: 0.0 standard drinks of alcohol   Drug use: No   Sexual activity: Yes    Birth control/protection: Surgical  Other Topics Concern   Not on file  Social History Narrative   Originally from [place], [place], & moved to  [place] at 58 y.o. She reports she started a new job in July 2016. 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 No pets currently. No bird, mold, or hot tub exposure.       Right Handed    Lives in a one story home    Social Drivers of Health   Financial Resource Strain: Not on file  Food Insecurity: No Food Insecurity (07/14/2023)   Hunger Vital Sign    Worried About Running Out of Food in the Last Year: Never true    Ran Out of Food in the Last Year: Never true  Transportation Needs: No Transportation Needs (07/14/2023)   PRAPARE - Scientist, research (physical sciences) (Medical): No    Lack of Transportation (Non-Medical): No  Physical Activity: Not on file  Stress: Stress Concern Present (04/27/2023)   [employer] of Occupational Health - Occupational Stress Questionnaire    Feeling of Stress : Very much  Social Connections: Moderately Integrated (07/14/2023)   Social Connection and Isolation Panel [NHANES]    Frequency of Communication with Friends and Family: Twice a week    Frequency of Social Gatherings with Friends and Family: Once a week    Attends Religious Services: More than 4 times per year    Active Member of Clubs or Organizations: Yes    Attends Engineer, structural: More than 4 times per year    Marital Status: Never married   Past Medical History:  Past Medical History:  Diagnosis Date   Anal fissure    saw Dr. Loretha Brasil    Anxiety    on meds   Arthritis    on meds   Asthma    uses inhaler if needed   Carpal tunnel syndrome    Depression    on meds   Diabetes mellitus    on meds   GERD (gastroesophageal reflux disease)    on meds   History of ovarian cancer in adulthood    Hyperlipidemia    on meds   Hypertension    on meds   Ovarian cancer [facility]) 1986   1996   Skin cancer (melanoma) (HCC) 1997   Sleep apnea    uses CPAP    Tubular adenoma of colon 01/2014    Past Surgical History:  Procedure Laterality Date   ABDOMINAL HYSTERECTOMY  06/30/1984   BSO   BASAL CELL CARCINOMA EXCISION  10/09/2012   lower lip per Dr. Park Liter    COLONOSCOPY  03/23/2019   per Dr. Adela Lank, adenomatous polyps, repeat in 3 yrs   GLBS tumor ovary 1986     HEMORRHOID SURGERY     HIP FRACTURE SURGERY Right 2007   MELANOMA EXCISION  07/01/1995   upper chest     Sleep: Poor  Appetite:  Fair  Current Medications: Current Facility-Administered Medications  Medication Dose Route Frequency Provider Last Rate Last Admin   acetaminophen (TYLENOL) tablet 650 mg  650 mg Oral Q6H PRN Onuoha,  Josephine C, NP       albuterol (PROVENTIL) (2.5 MG/3ML) 0.083% nebulizer solution 3 mL  3 mL Inhalation Q6H PRN Earney Navy, NP       ALPRAZolam Prudy Feeler) tablet 0.5 mg  0.5 mg Oral BID PRN Dahlia Byes C, NP   0.5 mg at 07/17/23 2141  alum & mag hydroxide-simeth (MAALOX/MYLANTA) 200-200-20 MG/5ML suspension 30 mL  30 mL Oral Q4H PRN Dahlia Byes C, NP   30 mL at 07/16/23 1717   ARIPiprazole (ABILIFY) tablet 5 mg  5 mg Oral QHS Verner Chol, MD   5 mg at 07/18/23 2152   atorvastatin (LIPITOR) tablet 80 mg  80 mg Oral Daily Dahlia Byes C, NP   80 mg at 07/18/23 2156   diphenhydrAMINE (BENADRYL) capsule 50 mg  50 mg Oral Q6H PRN Dahlia Byes C, NP       haloperidol (HALDOL) tablet 5 mg  5 mg Oral TID PRN Dahlia Byes C, NP   5 mg at 07/17/23 0016   And   diphenhydrAMINE (BENADRYL) capsule 50 mg  50 mg Oral TID PRN Dahlia Byes C, NP       haloperidol lactate (HALDOL) injection 10 mg  10 mg Intramuscular TID PRN Dahlia Byes C, NP       And   diphenhydrAMINE (BENADRYL) injection 50 mg  50 mg Intramuscular TID PRN Dahlia Byes C, NP       And   LORazepam (ATIVAN) injection 2 mg  2 mg Intramuscular TID PRN Dahlia Byes C, NP       haloperidol lactate (HALDOL) injection 5 mg  5 mg Intramuscular TID PRN Dahlia Byes C, NP       And   diphenhydrAMINE (BENADRYL) injection 50 mg  50 mg Intramuscular TID PRN Dahlia Byes C, NP       And   LORazepam (ATIVAN) injection 2 mg  2 mg Intramuscular TID PRN Dahlia Byes C, NP       glimepiride (AMARYL) tablet 1 mg  1 mg Oral Q breakfast Onuoha, Josephine C, NP   1 mg at 07/19/23 0751   hydrochlorothiazide (HYDRODIURIL) tablet 12.5 mg  12.5 mg Oral Daily Onuoha, Josephine C, NP   12.5 mg at 07/19/23 0912   hydrOXYzine (ATARAX) tablet 25 mg  25 mg Oral TID PRN Dahlia Byes C, NP   25 mg at 07/16/23 2304   losartan (COZAAR) tablet 50 mg  50 mg Oral Daily Onuoha, Josephine C, NP   50 mg at  07/19/23 0912   magnesium hydroxide (MILK OF MAGNESIA) suspension 30 mL  30 mL Oral Daily PRN Dahlia Byes C, NP   30 mL at 07/19/23 0916   metFORMIN (GLUCOPHAGE-XR) 24 hr tablet 500 mg  500 mg Oral Q breakfast Verner Chol, MD   500 mg at 07/19/23 0751   nicotine (NICODERM CQ - dosed in mg/24 hours) patch 14 mg  14 mg Transdermal Q0600 Dahlia Byes C, NP       nortriptyline (PAMELOR) capsule 10 mg  10 mg Oral QHS Onuoha, Josephine C, NP   10 mg at 07/18/23 2155   pantoprazole (PROTONIX) EC tablet 20 mg  20 mg Oral BID Dahlia Byes C, NP   20 mg at 07/19/23 0913   venlafaxine XR (EFFEXOR-XR) 24 hr capsule 150 mg  150 mg Oral Daily Dahlia Byes C, NP   150 mg at 07/19/23 0912    Lab Results:  No results found. However, due to the size of the patient record, not all encounters were searched. Please check Results Review for a complete set of results.   Blood Alcohol level:  Lab Results  Component Value Date   [facility] <10 07/14/2023   ETH <10 12/29/2022    Metabolic Disorder Labs: Lab Results  Component Value Date   HGBA1C 7.7 (H) 07/13/2023  MPG 174.29 07/13/2023   MPG 122.63 12/29/2022   No results found for: "PROLACTIN" Lab Results  Component Value Date   CHOL 158 07/13/2023   TRIG 298 (H) 07/13/2023   HDL 41 07/13/2023   CHOLHDL 3.9 07/13/2023   VLDL 60 (H) 07/13/2023   LDLCALC 57 07/13/2023   LDLCALC 65 12/29/2022     Psychiatric Specialty Exam:  Presentation  General Appearance:  Appropriate for Environment; Fairly Groomed  Eye Contact: Fair  Speech: Clear and Coherent  Speech Volume: Normal  Handedness: Right   Mood and Affect  Mood: Euthymic  Affect: Appropriate   Thought Process  Thought Processes: Coherent  Descriptions of Associations:Intact  Orientation:Full (Time, Place and Person)  Thought Content:Logical  History of Schizophrenia/Schizoaffective disorder:No  Duration of Psychotic Symptoms:No data  recorded Hallucinations:Hallucinations: None  Ideas of Reference:None  Suicidal Thoughts:Suicidal Thoughts: No  Homicidal Thoughts:Homicidal Thoughts: No   Sensorium  Memory: Immediate Fair; Recent Fair; Remote Fair  Judgment: Fair  Insight: None   Executive Functions  Concentration: Fair  Attention Span: Fair  Recall: [employer] of Knowledge: Fair  Language: Fair   Psychomotor Activity  Psychomotor Activity: Psychomotor Activity: Normal  Musculoskeletal: Strength & Muscle Tone: within normal limits Gait & Station: normal Assets  Assets: Manufacturing systems engineer; Desire for Improvement; Resilience    Physical Exam: Physical Exam Vitals and nursing note reviewed.  HENT:     Right Ear: Tympanic membrane normal.     Nose: Nose normal.     Mouth/Throat:     Mouth: Mucous membranes are moist.  Eyes:     Pupils: Pupils are equal, round, and reactive to light.  Cardiovascular:     Rate and Rhythm: Normal rate.     Pulses: Normal pulses.  Pulmonary:     Effort: Pulmonary effort is normal.  Abdominal:     General: Bowel sounds are normal.  Musculoskeletal:        General: Normal range of motion.  Skin:    General: Skin is warm.  Neurological:     General: No focal deficit present.     Mental Status: She is alert.    ROS Blood pressure 134/84, pulse 98, temperature (!) 97.3 F (36.3 C), resp. rate 17, height 5\' 4"  (1.626 m), weight 108 kg, last menstrual period 03/22/1986, SpO2 99%. Body mass index is 40.85 kg/m.  Diagnosis: Principal Problem:   Major depressive disorder, recurrent severe without psychotic features [facility])  Treatment Plan Summary: Daily contact with patient to assess and evaluate symptoms and progress in treatment Patient is admitted to locked unit under safety precautions 2.  Patient has been started on medications including  Continue Effexor XR 150 mg daily, nortriptyline 10 mg nightly, Xanax 0.5 mg twice daily as needed  anxiety.   Discontinued on 07/17/23-Trileptal 150 mg twice daily for further mood stabilization. -patient declining the med Abilify 5 mg at bedtime for mood stabilization- discussed the side effects of EPS, cardiac issues, metabolic side effects.  Patient gave consent Recommend to participate in the group and milieu therapy Patient is on every 15 checks 3.  Patient was encouraged to attend group and work on coping strategies 4.  Social worker consulted to get collateral and help with a safe discharge plan Verner Chol, MD 07/19/2023, 11:58 AM

## 2023-07-20 ENCOUNTER — Encounter: Payer: Self-pay | Admitting: Behavioral Health

## 2023-07-20 DIAGNOSIS — F332 Major depressive disorder, recurrent severe without psychotic features: Secondary | ICD-10-CM | POA: Diagnosis not present

## 2023-07-20 MED ORDER — HYDROXYZINE HCL 25 MG PO TABS: 25.0000 mg | ORAL_TABLET | Freq: Three times a day (TID) | ORAL | 0 refills | Status: AC | PRN

## 2023-07-20 MED ORDER — NICOTINE 14 MG/24HR TD PT24: 14.0000 mg | MEDICATED_PATCH | Freq: Every day | TRANSDERMAL | Status: DC

## 2023-07-20 NOTE — Progress Notes (Signed)
  [facility] Adult Case Management Discharge Plan :  Will you be returning to the same living situation after discharge:  Yes,  pt will return home  At discharge, do you have transportation home?: Yes,  CSW will provide transportation to [facility], as pt's car is at [facility] Do you have the ability to pay for your medications: Yes,   [place] MEDICAID PREPAID HEALTH PLAN / [place] MEDICAID [facility] COMMUNITY  Release of information consent forms completed and in the chart;  Patient's signature needed at discharge.  Patient to Follow up at:  Follow-up Information     [facility] Follow up.   Why: Appointment with Essie Christine is scheduled for 07/21/2023 at [facility]. Appointment is face to face. Contact information: [address] [place],  [place]  [address]  Main: [phone] Fax: [phone]        [facility] [place] Follow up.   Why: Appointment with Dr. Clent Ridges is 07/21/2023 at 3PM. Contact information: [address] [place],  [place]  [address]  Main: [phone]                Next level of care provider has access to [facility] Link:no  Safety Planning and Suicide Prevention discussed: Yes,  CSW attempted to reach pt's identified contacts, unable to complete, CSW went over SPE brochure with pt at discharge      Has patient been referred to the Quitline?: Patient does not use tobacco/nicotine products  Patient has been referred for addiction treatment: No known substance use disorder.  [address] LCSWA 07/20/2023, 10:07 AM

## 2023-07-20 NOTE — Progress Notes (Signed)
Assumed care of patient this pm, she presented A&O x 4, affect & mood seemed anxious, and preoccupied. Pt out in the milieu, sociable, behavior appropriate to situation and was without complaints. Pt denied thoughts, plan or intent to harm self or others and made a safety commitment, Pt is compliant with taking medications and followed treatment plan goals  Pt educated on plan of care, medication regimen and she acknowledged an understanding and was without further complaints or concerns. Pt is maintained on q 15 min rounds.
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
## 2023-07-20 NOTE — BHH Suicide Risk Assessment (Signed)
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 Patient was admitted to geropsych unit at [facility] for further stabilization. Patient is currently on IVC.    During the patient's hospitalization, patient had extensive initial psychiatric evaluation, and follow-up psychiatric evaluations every day.   Psychiatric diagnoses provided upon initial assessment:  Major depressive disorder, recurrent severe without psychotic features   Patient's psychiatric medications were adjusted on admission:  -continue home effexor  -continue home pamelor  -continue hom exanax PRN    During the hospitalization, other adjustments were made to the patient's psychiatric medication regimen:  -Abilify was started at 5 mg every day  -Pamelor was dc on discharge, as this is polypharmacy with effexor and pt was not able to verbalize her understanding why she was on both.    Pt states she already has meds and prescriptions for all home meds except Abilify.  This is my first day meeting the pt, I had reservations about her use of xanax, but will continue this at dc as she has been on this for some time and do not want to destabilize her further, at discharge, by adjusting or dc this medication. Pdmp reviewed by me 07-20-23.    Patient's care was discussed during the interdisciplinary team meeting every day during the hospitalization.   The patient denied having side effects to prescribed psychiatric medication.   Gradually, patient started adjusting to milieu. The patient was evaluated each day by a  clinical provider to ascertain response to treatment. Improvement was noted by the patient's report of decreasing symptoms, improved sleep and appetite, affect, medication tolerance, behavior, and participation in unit programming.  Patient was asked each day to complete a self inventory noting mood, mental status, pain, new symptoms, anxiety and concerns.     Symptoms were reported as significantly decreased or resolved completely by discharge.    On day of discharge, the patient reports that their mood is stable. The patient denied having suicidal thoughts for more than 48 hours prior to discharge.  Patient denies having homicidal thoughts, which was explored carefully.  Patient denies having auditory hallucinations.  Patient denies any visual hallucinations or other symptoms of psychosis. The patient was motivated to continue taking medication with a goal of continued improvement in mental health.    The patient reports their target psychiatric symptoms of mood instability and HI, all responded well to the psychiatric medications, and the patient reports overall benefit other psychiatric hospitalization. Supportive psychotherapy was provided to the patient. The patient also participated in regular group therapy while hospitalized. Coping skills, problem solving as well as relaxation therapies were also part of the unit programming.   Labs were reviewed with the patient, and abnormal results were discussed with the patient.   The patient is able to verbalize their individual safety plan to this provider.   # It is recommended to the patient to continue psychiatric medications as prescribed, after discharge from the hospital.     # It is recommended to the patient to follow up with your outpatient psychiatric provider and PCP.   # It was discussed with the patient, the impact of alcohol, drugs,  tobacco have been there overall psychiatric and medical wellbeing, and total abstinence from substance use  was recommended the patient.ed.   # Prescriptions provided or sent directly to preferred pharmacy at discharge. Patient agreeable to plan. Given opportunity to ask questions. Appears to feel comfortable with discharge.    # In the event of worsening symptoms, the patient is instructed to call the crisis hotline, 911 and or go to the nearest ED for appropriate evaluation and treatment of symptoms. To follow-up with primary care provider for other medical issues, concerns and or health care needs   # Patient was discharged to home, with a plan to follow up as noted below.      Psychiatric Specialty Exam  Presentation  General Appearance:  Appropriate for Environment  Eye Contact: Fair  Speech: Clear and Coherent  Speech Volume: Normal  Handedness: Right   Mood and Affect  Mood: Euthymic  Duration of Depression Symptoms: Greater than two weeks  Affect: Appropriate   Thought Process  Thought Processes: Coherent  Descriptions of Associations:Intact  Orientation:Full (Time, Place and Person)  Thought Content:Logical  History of Schizophrenia/Schizoaffective disorder:No  Duration of Psychotic Symptoms:No data recorded Hallucinations:Hallucinations: None  Ideas of Reference:None  Suicidal Thoughts:Suicidal Thoughts: No  Homicidal Thoughts:Homicidal Thoughts: No   Sensorium  Memory: Immediate Fair; Recent Fair; Remote Fair  Judgment: Fair  Insight: None   Executive Functions  Concentration: Fair  Attention Span: Fair  Recall: [employer] of Knowledge: Fair  Language: Fair   Psychomotor Activity  Psychomotor Activity: Psychomotor Activity: Normal   Assets  Assets: Communication Skills; Desire for Improvement; Resilience   Sleep  Sleep: Sleep: Fair   Physical Exam: Physical Exam See discharge summary  ROS See discharge summary   Blood pressure 109/69, pulse (!) 109, temperature (!) 97.3 F (36.3 C), resp. rate 18, height  5\' 4"  (1.626 m), weight 108 kg, last menstrual period 03/22/1986, SpO2 98%. Body mass index is 40.85 kg/m.  Mental Status Per Nursing Assessment::   On Admission:  NA  Demographic factors:  Living alone, Unemployed Loss Factors:  Decrease in vocational status Historical Factors:  NA Risk Reduction Factors:  Positive social support, Sense of responsibility to family    Continued Clinical Symptoms:  Mood is stable. Anxiety at a manageable level. Denying any SI including passive SI.    Cognitive Features That Contribute To Risk:  none    Suicide Risk:  Mild:  There are no identifiable suicide plans, no associated intent, mild dysphoria and related symptoms, good self-control (both objective and subjective assessment), few other risk factors, and identifiable protective factors, including available and accessible social support.    Follow-up Information     [facility] at [facility] Follow up.   Why: Appointment with Essie Christine is scheduled for 07/21/2023 at  [facility]. Appointment is face to face. Contact information: [address] [place],  [place]  [address]  Main: [phone] Fax: [phone]        [facility] [place] Follow up.   Why: Appointment with Dr. Clent Ridges is 07/21/2023 at 3PM. Contact information: [address] [place],  [place]  [address]  Main: [phone]                Plan Of Care/Follow-up recommendations:   -Follow-up with your outpatient psychiatric provider -instructions on appointment date, time, and address (location) are provided to you in discharge paperwork.   -Take your psychiatric medications as prescribed at discharge - instructions  are provided to you in the discharge paperwork   -Follow-up with outpatient primary care doctor and other specialists -for management of preventative medicine and chronic medical disease   -If you are prescribed an atypical antipsychotic  medication, we recommend that your outpatient psychiatrist follow routine screening for side effects within 3 months of discharge, including monitoring: AIMS scale, height, weight, blood pressure, fasting lipid panel, HbA1c, and fasting blood sugar.    -Recommend total abstinence from alcohol, tobacco, and other illicit drug use at discharge.    -If your psychiatric symptoms recur, worsen, or if you have side effects to your psychiatric medications, call your outpatient psychiatric provider, 911, [phone] or go to the nearest emergency department.   -If suicidal thoughts occur, immediately call your outpatient psychiatric provider, 911, [phone] or go to the nearest emergency department.    Phineas Inches, MD 07/20/2023, 1:41 PM

## 2023-07-20 NOTE — Group Note (Signed)
Recreation Therapy Group Note   Group Topic:Health and Wellness  Group Date: 07/20/2023 Start Time: 1100 End Time: 1145 Facilitators: Rosina Lowenstein, LRT, CTRS Location:  Dayroom  Activity Description/Intervention: Therapeutic Drumming. Patients with peers and staff were given the opportunity to engage in a leader facilitated HealthRHYTHMS Group Empowerment Drumming Circle with staff from the [employer], in partnership with [employer]. Teaching laboratory technician and trained [employer], Theodoro Doing leading with LRT observing and documenting intervention and pt response. This evidenced-based practice targets 7 areas of health and wellbeing in the human experience including: stress-reduction, exercise, self-expression, camaraderie/support, nurturing, spirituality, and music-making (leisure).   Goal Area(s) Addresses:  Patient will engage in pro-social way in music group.  Patient will follow directions of drum leader on the first prompt. Patient will demonstrate no behavioral issues during group.  Patient will identify if a reduction in stress level occurs as a result of participation in therapeutic drum circle.    Education: Leisure exposure, Pharmacologist, Musical expression, Discharge Planning   Clinical Observations/Individualized Feedback: Morgan [name] actively engaged in therapeutic drumming exercise and discussions. Pt was appropriate with peers, staff, and musical equipment for duration of programming.  Pt identified "relieved" as their feeling after participation in music-based programming. Pt affect congruent/incongruent with verbalized emotion.   Plan: Continue to engage patient in RT group sessions 2-3x/week.   [address], LRT, CTRS 07/20/2023 1:42 PM

## 2023-07-20 NOTE — Plan of Care (Signed)
  Problem: Education: Goal: Utilization of techniques to improve thought processes will improve Outcome: Adequate for Discharge Goal: Knowledge of the prescribed therapeutic regimen will improve Outcome: Adequate for Discharge   Problem: Activity: Goal: Interest or engagement in leisure activities will improve Outcome: Adequate for Discharge Goal: Imbalance in normal sleep/wake cycle will improve Outcome: Adequate for Discharge   Problem: Health Behavior/Discharge Planning: Goal: Ability to make decisions will improve Outcome: Adequate for Discharge Goal: Compliance with therapeutic regimen will improve Outcome: Adequate for Discharge   Problem: Role Relationship: Goal: Will demonstrate positive changes in social behaviors and relationships Outcome: Adequate for Discharge   Problem: Safety: Goal: Ability to disclose and discuss suicidal ideas will improve Outcome: Adequate for Discharge Goal: Ability to identify and utilize support systems that promote safety will improve Outcome: Adequate for Discharge   Problem: Self-Concept: Goal: Will verbalize positive feelings about self Outcome: Adequate for Discharge Goal: Level of anxiety will decrease Outcome: Adequate for Discharge

## 2023-07-20 NOTE — Plan of Care (Signed)
  Problem: Health Behavior/Discharge Planning: Goal: Compliance with therapeutic regimen will improve Outcome: Adequate for Discharge   Problem: Role Relationship: Goal: Will demonstrate positive changes in social behaviors and relationships 07/20/2023 0112 by Jovita Gamma, RN Outcome: Progressing 07/20/2023 0111 by Jovita Gamma, RN Outcome: Adequate for Discharge   Problem: Safety: Goal: Ability to disclose and discuss suicidal ideas will improve Outcome: Adequate for Discharge Goal: Ability to identify and utilize support systems that promote safety will improve Outcome: Adequate for Discharge   Problem: Self-Concept: Goal: Will verbalize positive feelings about self Outcome: Adequate for Discharge Goal: Level of anxiety will decrease Outcome: Adequate for Discharge

## 2023-07-20 NOTE — Progress Notes (Signed)
Patient ID: Morgan [name], female   DOB: [birth year], 58 y.o.   MRN: [id number]  Patient was discharged from [facility] unit at approx 1505 escorted by staff. Patient denies SI/HI/AVH. Discharge packet to include printed AVS, Suicide Risk Assessment, and Transition Record reviewed with patient. Belongings to include purse returned. Suicide safety plan completed with a copy kept in chart.

## 2023-07-20 NOTE — Plan of Care (Signed)
  Problem: Education: Goal: Utilization of techniques to improve thought processes will improve Outcome: Adequate for Discharge Goal: Knowledge of the prescribed therapeutic regimen will improve Outcome: Adequate for Discharge   Problem: Activity: Goal: Interest or engagement in leisure activities will improve Outcome: Adequate for Discharge Goal: Imbalance in normal sleep/wake cycle will improve Outcome: Adequate for Discharge   Problem: Coping: Goal: Coping ability will improve Outcome: Adequate for Discharge Goal: Will verbalize feelings Outcome: Adequate for Discharge   Problem: Safety: Goal: Ability to disclose and discuss suicidal ideas will improve Outcome: Adequate for Discharge Goal: Ability to identify and utilize support systems that promote safety will improve Outcome: Adequate for Discharge   Problem: Role Relationship: Goal: Will demonstrate positive changes in social behaviors and relationships Outcome: Adequate for Discharge

## 2023-07-21 ENCOUNTER — Encounter: Payer: Self-pay | Admitting: Family Medicine

## 2023-07-21 ENCOUNTER — Ambulatory Visit: Payer: Medicaid Other | Admitting: Behavioral Health

## 2023-07-21 ENCOUNTER — Encounter: Payer: Self-pay | Admitting: Behavioral Health

## 2023-07-21 ENCOUNTER — Ambulatory Visit (INDEPENDENT_AMBULATORY_CARE_PROVIDER_SITE_OTHER): Payer: Medicaid Other | Admitting: Family Medicine

## 2023-07-21 VITALS — BP 110/76 | HR 81 | Temp 97.7°F | Wt 227.0 lb

## 2023-07-21 DIAGNOSIS — R454 Irritability and anger: Secondary | ICD-10-CM | POA: Diagnosis not present

## 2023-07-21 DIAGNOSIS — F319 Bipolar disorder, unspecified: Secondary | ICD-10-CM | POA: Insufficient documentation

## 2023-07-21 DIAGNOSIS — F331 Major depressive disorder, recurrent, moderate: Secondary | ICD-10-CM | POA: Diagnosis not present

## 2023-07-21 DIAGNOSIS — F411 Generalized anxiety disorder: Secondary | ICD-10-CM | POA: Diagnosis not present

## 2023-07-21 DIAGNOSIS — F3132 Bipolar disorder, current episode depressed, moderate: Secondary | ICD-10-CM | POA: Diagnosis not present

## 2023-07-21 DIAGNOSIS — F332 Major depressive disorder, recurrent severe without psychotic features: Secondary | ICD-10-CM

## 2023-07-21 MED ORDER — METOPROLOL TARTRATE 50 MG PO TABS: 50.0000 mg | ORAL_TABLET | Freq: Two times a day (BID) | ORAL | 3 refills | Status: DC

## 2023-07-21 NOTE — Progress Notes (Signed)
   Subjective:    Patient ID: Morgan [name], female    DOB: 1966/04/19, 58 y.o.   MRN: [id number]  HPI Here to follow up a psychiatric hospital stay from 07-14-23 to 07-20-23 for anger issues and homicidal ideation. This started as an IVC. She has bipolar disorder with depression, she has been struggling anger control for the past few weeks. 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 Apparently all three of Deyana's assigned clients "fired" her a week ago, and she became angry. Her coworkers heard her talk about wanting to hurt these people, so they suggested she seek help. She has seen a psychiatrist in the past, but she has been seeing [place] for care for care of her bipolar depression. She also sees a therapist who she trusts. While in the behavioral health facility, her Nortriptyline at bedtime was stopped, and she was started on Abilify 5 mg at bedtime. Her other medications, including Venlafaxine, were continued. Since her DC she has felt much better, and she was able to sleep last night. She is meeting with her employer tomorrow to see if she still has a job or not. She has also applied for disability apparently.   Review of Systems  Constitutional: Negative.   Respiratory: Negative.    Cardiovascular: Negative.   Psychiatric/Behavioral:  Positive for agitation, behavioral problems and dysphoric mood. Negative for confusion, hallucinations, self-injury, sleep disturbance and suicidal ideas. The patient is nervous/anxious.        Objective:   Physical Exam Constitutional:      Appearance: Normal appearance.  Cardiovascular:     Rate and Rhythm: Normal rate and regular rhythm.     Pulses: Normal pulses.     Heart sounds: Normal heart sounds.  Pulmonary:     Effort: Pulmonary effort is normal.     Breath sounds: Normal breath sounds.  Neurological:     Mental Status: She is alert and oriented to person, place, and time.  Psychiatric:         Mood and Affect: Mood normal.        Behavior: Behavior normal.        Thought Content: Thought content normal.        Judgment: Judgment normal.           Assessment & Plan:  She is recovering from what may have been a manic episode from her bipolar disorder. Today she seems to have some mild depression, but she seems quite stable and has a good understanding of her situation. We will meet with her again in 2 weeks, and at that time I anticipate tapering up on the dose of Abilify. We spent a total of (35   ) minutes reviewing records and discussing these issues.  Gershon Crane, MD

## 2023-07-21 NOTE — Progress Notes (Signed)
[facility] Counselor/Therapist Progress Note  Patient ID: Morgan [name], MRN: [id number],    Date: 07/21/2023  Time Spent: 60 minutes, 9 AM until 10 AM spent face-to-face with the patient in the outpatient therapist office.  Treatment Type: Individual Therapy  Reported Symptoms: Anxiety, depression, irritability  Mental Status Exam: Appearance:  Casual     Behavior: Appropriate  Motor: Normal  Speech/Language:  Normal Rate  Affect: Appropriate  Mood: normal  Thought process: normal  Thought content:   WNL  Sensory/Perceptual disturbances:   WNL  Orientation: oriented to person, place, time/date, situation, day of week, month of year, and year  Attention: Good  Concentration: Good  Memory: WNL  Fund of knowledge:  Good  Insight:   Good  Judgment:  Good  Impulse Control: Good   Risk Assessment: Danger to Self:   The patient has had thoughts of being overwhelmed but says she will contract for safety not hurt herself or anyone else. Self-injurious Behavior: No Danger to Others: No Duty to Warn:no Physical Aggression / Violence:No  Access to Firearms a concern: No  Gang Involvement:No   Subjective: The patient has been inpatient for the past week and was discharged yesterday.  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  She said she pulled off to the side of the road after a phone conversation with her manager and got very upset.  She reached out to her medical doctor who suggested that she go to behavioral health and just talk to them.  She was telling them about what happened and how upset she was.  She said she did not feel unsafe and even though she was angry at her manager she did not express any homicidal ideation but the hospital felt that an involuntary commitment was necessary so they took her from [place] to the [facility].   She did say she benefited for some time away and met some people with very difficult stories and felt she was helped and helped some.  They did add Abilify and removed Pamelor but she is going to speak to her medical doctor about that today.  Today we worked on challenging those anxious or irritable thoughts including the fact that she can find another job if she does not keep this job and she is meeting with her Statistician.  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  We also introduced progressive muscle relaxation because she acknowledges storing stress especially in her chest and neck area.  I encouraged daily practice of the coping skills.  She also is starting to carry a stress ball around so when she does notice the anxiety and/or irritability going up it can be a reminder to her to practice her skills.  She does contract for safety having no thoughts of hurting herself or anyone else.  Interventions: Cognitive Behavioral Therapy and Dialectical Behavioral Therapy  Diagnosis: Generalized anxiety disorder, major depressive disorder, moderate to severe, irritability  Plan: I will meet with the patient every 2 to 3 weeks in person.  Treatment plan: We will use cognitive behavioral therapy principles as well as elements of dialectical behavior therapy to reduce the patient's anxiety depression and irritability by at least 50% with a target goal of Nov 27, 2023.  Goals for her depression will be for the patient  to have less sadness as indicated by patient report and PHQ-9 scores, have improved mood and return to a healthier level of functioning as well as identify causes for depressed mood and learn ways to reduce depression and using a crisis plan if patient has active suicidal thoughts.  Interventions include using cognitive behavioral therapy to explore and replace unhealthy thought and behavior patterns contributing to depression, provide  education about depression to help her understand its causes, teach and encouraged use of coping skills to reduce depression and make a crisis plan and assess ongoing level of safety.  Goals for reducing anxiety include improving her ability to manage anxiety/stress symptoms, identify causes for anxiety and stress and explore ways to reduce it, resolve core conflicts including family history contributing to it and help her manage thoughts and worrisome thinking contributing to feelings of anxiety.  Interventions for reducing anxiety include providing education about anxiety to help around identify its causes symptoms and triggers, facilitate problem solution skills and coping skills for reducing anxiety.  We will also use cognitive behavioral therapy to identify and change anxiety provoking thought and behavior patterns as well as use dialectical behavior therapy to reduce anxiety through distress tolerance and mindfulness skills.  Goals for reducing irritability/anger include helping the patient felt an awareness of anger behaviors and alternatives to using aggressive behavior.  We will also look at learning ways to handle angry feelings in constructive ways that cannot hands daily functioning for the patient.  Interventions including using dialectical behavior therapy mindfulness skills, cognitive behavioral therapy to identify negative consequences for expressing anger and irritability and unhealthy ways.  We will also use cognitive behavioral therapy to explore and replace unhealthy thought and behavior patterns contributing to feelings of resentment and disappointment.  We will identify core values that may not being met that are contributing to feelings of anger.  We will confront and reflects the patient's behaviors that happened when both angry and out of session.  We will assist the patient in looking at how anger was modeled for her and how this positively or negatively impacted her.  We will also  teach respectively assertive communication skills as well as self soothing skills to help the patient better manage anger reactions.  Morgan [name], [facility]                  Morgan [name], [facility]               Morgan [name], [facility]

## 2023-07-22 ENCOUNTER — Encounter: Payer: Self-pay | Admitting: Family Medicine

## 2023-07-22 MED ORDER — ARIPIPRAZOLE 5 MG PO TABS: 5.0000 mg | ORAL_TABLET | Freq: Every day | ORAL | 5 refills | Status: DC

## 2023-07-22 NOTE — Telephone Encounter (Signed)
I just sent it in myself

## 2023-07-24 ENCOUNTER — Other Ambulatory Visit (HOSPITAL_COMMUNITY): Payer: Self-pay

## 2023-07-24 ENCOUNTER — Encounter: Payer: Self-pay | Admitting: Internal Medicine

## 2023-07-24 ENCOUNTER — Encounter: Payer: Self-pay | Admitting: Behavioral Health

## 2023-07-24 ENCOUNTER — Telehealth: Payer: Self-pay | Admitting: Pharmacy Technician

## 2023-07-24 NOTE — Telephone Encounter (Signed)
Pharmacy Patient Advocate Encounter   Received notification from CoverMyMeds that prior authorization for ARIPiprazole 5MG  tablets is required/requested.   Insurance verification completed.   The patient is insured through [facility] .   Per test claim: PA required; PA submitted to above mentioned insurance via CoverMyMeds Key/confirmation #/EOC [id number] Status is pending

## 2023-07-25 ENCOUNTER — Encounter: Payer: Self-pay | Admitting: Behavioral Health

## 2023-07-27 ENCOUNTER — Other Ambulatory Visit (HOSPITAL_COMMUNITY): Payer: Self-pay

## 2023-07-27 NOTE — Telephone Encounter (Signed)
Pharmacy Patient Advocate Encounter  Received notification from [facility] that Prior Authorization for ARIPiprazole 5MG  tablets has been APPROVED from 07/24/2023 to 07/23/2024. Unable to obtain price due to refill too soon rejection, last fill date 07/24/2023 next available fill date02/16/2025   PA #/Case ID/Reference #: [id number]

## 2023-07-28 NOTE — Telephone Encounter (Signed)
Let's give the Abilify more time. It will take 2 weeks to really be effective

## 2023-07-29 ENCOUNTER — Telehealth: Payer: Self-pay | Admitting: Family Medicine

## 2023-07-29 ENCOUNTER — Ambulatory Visit (AMBULATORY_SURGERY_CENTER): Payer: Medicaid Other

## 2023-07-29 VITALS — Ht 64.0 in | Wt 238.0 lb

## 2023-07-29 DIAGNOSIS — K219 Gastro-esophageal reflux disease without esophagitis: Secondary | ICD-10-CM

## 2023-07-29 NOTE — Telephone Encounter (Signed)
Attempted to call [employer] back no success of speaking with anyone, will try calling back in the morning

## 2023-07-29 NOTE — Telephone Encounter (Signed)
Copied from CRM [id number]. Topic: General - Call Back - No Documentation >> Jul 29, 2023  3:35 PM Samuel Jester B wrote: Reason for CRM: Melissa called from [facility] care requesting a call back from the PCP nurse assistant. Callback number is [phone] ext [phone]

## 2023-07-29 NOTE — Telephone Encounter (Signed)
Copied from CRM [id number]. Topic: General - Other >> Jul 29, 2023  3:59 PM Almira Coaster wrote: Reason for CRM: Patient is calling because she is trying to get on disability; however, they do need a letter from Dr.Fry explaining how her disability keeps her from working. She is willing to pick up the letter from the office.

## 2023-07-29 NOTE — Progress Notes (Signed)
No egg or soy allergy known to patient  No issues known to pt with past sedation with any surgeries or procedures Patient denies ever being told they had issues or difficulty with intubation  No FH of Malignant Hyperthermia Pt is not on diet pills Pt is not on  home 02  OSA CPAP  Pt is not on blood thinners  Pt denies issues with constipation  No A fib or A flutter Have any cardiac testing pending-- no  LOA: independent   Patient's chart reviewed by Cathlyn Parsons CNRA prior to previsit and patient appropriate for the [facility].  Previsit completed and red dot placed by patient's name on their procedure day (on provider's schedule).     PV completed with patient. Prep instructions sent via mychart and home address.

## 2023-07-30 NOTE — Telephone Encounter (Signed)
This letter is ready to be picked up.

## 2023-08-01 DIAGNOSIS — M778 Other enthesopathies, not elsewhere classified: Secondary | ICD-10-CM | POA: Diagnosis not present

## 2023-08-04 ENCOUNTER — Ambulatory Visit: Payer: Medicaid Other | Admitting: Behavioral Health

## 2023-08-04 ENCOUNTER — Encounter: Payer: Self-pay | Admitting: Behavioral Health

## 2023-08-04 DIAGNOSIS — R454 Irritability and anger: Secondary | ICD-10-CM

## 2023-08-04 DIAGNOSIS — F411 Generalized anxiety disorder: Secondary | ICD-10-CM

## 2023-08-04 DIAGNOSIS — F332 Major depressive disorder, recurrent severe without psychotic features: Secondary | ICD-10-CM

## 2023-08-04 NOTE — Progress Notes (Addendum)
 [facility] Counselor/Therapist Progress Note  Patient ID: Morgan [name], MRN: [id number],    Date: 08/04/2023  Time Spent: 58 minutes, 10 AM until 10:58 AM spent face-to-face with the patient in the outpatient therapist office.  Treatment Type: Individual Therapy  Reported Symptoms: Anxiety, depression, irritability  Mental Status Exam: Appearance:  Casual     Behavior: Appropriate  Motor: Normal  Speech/Language:  Normal Rate  Affect: Appropriate  Mood: normal  Thought process: normal  Thought content:   WNL  Sensory/Perceptual disturbances:   WNL  Orientation: oriented to person, place, time/date, situation, day of week, month of year, and year  Attention: Good  Concentration: Good  Memory: WNL  Fund of knowledge:  Good  Insight:   Good  Judgment:  Good  Impulse Control: Good   Risk Assessment: Danger to Self:  The patient has had thoughts of being overwhelmed but says she will contract for safety not hurt herself or anyone else. Self-injurious Behavior: No Danger to Others: No Duty to Warn:no Physical Aggression / Violence:No  Access to Firearms a concern: No  Gang Involvement:No   Subjective: The patient is not getting any opportunities with her current employer so she is actively looking for other work.  Another agency has spoken to her about doing some of the same type responsibilities and she will be making about $2 an hour or more.  She is going to apply to that and knows a friend who manages a disability advocacy company who will help her with going through all the paperwork and making sure it is all filled out correctly.  She is in the difficult place financially having somebody in her 401(k) but otherwise not having any income.  She feels she is at a crossroads making decisions because she has to make a decision by early March as to what she will continue to live in her apartment.  We talked about the pros and cons of that.  She also just  found out that her mother has a blood clot and was having more tests today to see which direction that we will take.  That will influence her decision as to whether she moves closer to her mother and her sister.  The job opportunity will also play a part of that.  We spent most of the session looking at those things which trigger the patient's frustration.  She acknowledges and that I validated that there have been a lot of difficult things happen to her recently but we attempted to redirect those thoughts and compartmentalize them into the thoughts that are more manageable and not so overwhelmed.  We talked about the importance of the model of healthy emotional regulation and emotional responses in difficult situations.   She is only 2 weeks into her new medication and I encouraged her to give another week or 2 to see if that made a difference before making any decisions.  She has a good relationship with her primary care physician and I encouraged her to speak to him before making any changes also.  She reports no side effects with him but also does not see a lot of benefit and she is on a low dose. She does contract for safety having no thoughts of hurting herself or anyone else.  Interventions: Cognitive Behavioral Therapy and Dialectical Behavioral Therapy  Diagnosis: Generalized anxiety disorder, major depressive disorder,recurrent,severe. Irritability  Plan: I will meet with the patient every 2 to 3 weeks in person.  Treatment plan: We will  use cognitive behavioral therapy principles as well as elements of dialectical behavior therapy to reduce the patient's anxiety depression and irritability by at least 50% with a target goal of Nov 27, 2023.  Goals for her depression will be for the patient to have less sadness as indicated by patient report and PHQ-9 scores, have improved mood and return to a healthier level of functioning as well as identify causes for depressed mood and learn ways to reduce  depression and using a crisis plan if patient has active suicidal thoughts.  Interventions include using cognitive behavioral therapy to explore and replace unhealthy thought and behavior patterns contributing to depression, provide education about depression to help her understand its causes, teach and encouraged use of coping skills to reduce depression and make a crisis plan and assess ongoing level of safety.  Goals for reducing anxiety include improving her ability to manage anxiety/stress symptoms, identify causes for anxiety and stress and explore ways to reduce it, resolve core conflicts including family history contributing to it and help her manage thoughts and worrisome thinking contributing to feelings of anxiety.  Interventions for reducing anxiety include providing education about anxiety to help around identify its causes symptoms and triggers, facilitate problem solution skills and coping skills for reducing anxiety.  We will also use cognitive behavioral therapy to identify and change anxiety provoking thought and behavior patterns as well as use dialectical behavior therapy to reduce anxiety through distress tolerance and mindfulness skills.  Goals for reducing irritability/anger include helping the patient felt an awareness of anger behaviors and alternatives to using aggressive behavior.  We will also look at learning ways to handle angry feelings in constructive ways that cannot hands daily functioning for the patient.  Interventions including using dialectical behavior therapy mindfulness skills, cognitive behavioral therapy to identify negative consequences for expressing anger and irritability and unhealthy ways.  We will also use cognitive behavioral therapy to explore and replace unhealthy thought and behavior patterns contributing to feelings of resentment and disappointment.  We will identify core values that may not being met that are contributing to feelings of anger.  We will  confront and reflects the patient's behaviors that happened when both angry and out of session.  We will assist the patient in looking at how anger was modeled for her and how this positively or negatively impacted her.  We will also teach respectively assertive communication skills as well as self soothing skills to help the patient better manage anger reactions.  Cecile Coder, [facility]                  Cecile Coder, [facility]               Cecile Coder, [facility]               Cecile Coder, [facility]

## 2023-08-05 NOTE — Telephone Encounter (Signed)
 FYI

## 2023-08-06 MED ORDER — ARIPIPRAZOLE 2 MG PO TABS: 2.0000 mg | ORAL_TABLET | Freq: Every day | ORAL | 2 refills | Status: DC

## 2023-08-06 NOTE — Telephone Encounter (Signed)
 Not successful speaking with an agent.

## 2023-08-06 NOTE — Telephone Encounter (Signed)
 I understand. Stop taking the 5 mg dose of Abilify . You cannot split these, so I sent in a new RX for Abilify  2 mg daily. Try this for 2 weeks and report back to us 

## 2023-08-10 ENCOUNTER — Ambulatory Visit (INDEPENDENT_AMBULATORY_CARE_PROVIDER_SITE_OTHER): Payer: Medicaid Other | Admitting: Internal Medicine

## 2023-08-10 ENCOUNTER — Encounter: Payer: Self-pay | Admitting: Internal Medicine

## 2023-08-10 ENCOUNTER — Encounter: Payer: Self-pay | Admitting: Obstetrics and Gynecology

## 2023-08-10 VITALS — BP 112/72 | HR 76 | Ht 64.0 in | Wt 228.0 lb

## 2023-08-10 DIAGNOSIS — E1142 Type 2 diabetes mellitus with diabetic polyneuropathy: Secondary | ICD-10-CM

## 2023-08-10 DIAGNOSIS — G4733 Obstructive sleep apnea (adult) (pediatric): Secondary | ICD-10-CM | POA: Diagnosis not present

## 2023-08-10 DIAGNOSIS — Z7984 Long term (current) use of oral hypoglycemic drugs: Secondary | ICD-10-CM | POA: Diagnosis not present

## 2023-08-10 DIAGNOSIS — E1165 Type 2 diabetes mellitus with hyperglycemia: Secondary | ICD-10-CM

## 2023-08-10 MED ORDER — GLIMEPIRIDE 1 MG PO TABS: 1.0000 mg | ORAL_TABLET | Freq: Every day | ORAL | 3 refills | Status: DC

## 2023-08-10 MED ORDER — RYBELSUS 14 MG PO TABS: 14.0000 mg | ORAL_TABLET | Freq: Every day | ORAL | 3 refills | Status: DC

## 2023-08-10 MED ORDER — RYBELSUS 14 MG PO TABS: 14.0000 mg | ORAL_TABLET | Freq: Every day | ORAL | 1 refills | Status: DC

## 2023-08-10 MED ORDER — METFORMIN HCL ER 500 MG PO TB24: 1000.0000 mg | ORAL_TABLET | Freq: Every day | ORAL | 3 refills | Status: DC

## 2023-08-10 NOTE — Progress Notes (Signed)
 Name: Morgan [name]  Age/ Sex: 58 y.o., female   MRN/ DOB: [id number], 23-Apr-1966     PCP: Donley Furth, MD   Reason for Endocrinology Evaluation: Type 2 Diabetes Mellitus  Initial Endocrine Consultative Visit: 02/16/2019    PATIENT IDENTIFIER: Morgan [name] is a 58 y.o. female with a past medical history of HTN, OSA,T2DM,and obesity, Ovarian cancer(S/P surgery )  and melanoma. The patient has followed with Endocrinology clinic since 02/16/2019 for consultative assistance with management of her diabetes.  DIABETIC HISTORY:  Morgan [name] was diagnosed with T2DM in 2000. She has been on Metformin , Glipizide  and Actos  since her diagnosis. Her hemoglobin A1c has ranged from 6.8% in 2018, peaking at 8.3% in 2020.  Rybelsus  added in 01/2019 Pioglitazone  stopped 05/2019  We attempted to discontinue glipizide  in November 2022 due to hypoglycemia and an A1c of 6.1%, but this had to be restarted in May 2023 with an A1c 8.6%  Glimepiride  was discontinued 03/2022 due to hypoglycemia, was restarted 07/2022 with an A1c of 8.1%  Normal 24-hr urinary cortisol 08/2020  SUBJECTIVE:   During the last visit (02/06/2023): A1c 5.9 %.    Today (08/10/2023): Morgan [name] is here for a  follow up on diabetes management.   She checks her blood sugars occasionally. No hypoglycemia    She follows  with pulmo for OSA   Denies nausea or vomiting  Denies constipation or diarrhea     HOME DIABETES REGIMEN:  Metformin  500 mg XR  ,1  tablet daily  Glimepiride  1 mg daily Rybelsus  14 mg daily    METER DOWNLOAD SUMMARY: unable to download   122 - 233 mg/dL    DIABETIC COMPLICATIONS: Microvascular complications:    Denies: retinopathy, CKD, neuropathy  Last eye exam: scheduled 10/2021   Macrovascular complications:    Denies: CAD, PVD, CVA   HISTORY:  Past Medical History:  Past Medical History:  Diagnosis Date   Anal fissure    saw Dr. Darcus Eastern    Anxiety     on meds   Arthritis    on meds   Asthma    uses inhaler if needed   Carpal tunnel syndrome    Depression    on meds   Diabetes mellitus    on meds   GERD (gastroesophageal reflux disease)    on meds   History of ovarian cancer in adulthood    Hyperlipidemia    on meds   Hypertension    on meds   Ovarian cancer [facility]) 1986   1996   Skin cancer (melanoma) (HCC) 1997   Sleep apnea    uses CPAP    Tubular adenoma of colon 01/2014   Past Surgical History:  Past Surgical History:  Procedure Laterality Date   ABDOMINAL HYSTERECTOMY  06/30/1984   BSO   BASAL CELL CARCINOMA EXCISION  10/09/2012   lower lip per Dr. Roel Clarity    COLONOSCOPY  03/23/2019   per Dr. General Kenner, adenomatous polyps, repeat in 3 yrs   GLBS tumor ovary 1986     HEMORRHOID SURGERY     HIP FRACTURE SURGERY Right 2007   MELANOMA EXCISION  07/01/1995   upper chest    Social History:  reports that she has been smoking cigarettes. She has never used smokeless tobacco. She reports that she does not drink alcohol and does not use drugs. Family History:  Family History  Problem Relation Age of Onset   Dementia Mother  Asthma Mother    Multiple sclerosis Father    Uterine cancer Sister 30   Diabetes Brother    Diabetes Brother    Basal cell carcinoma Brother        lower lip   Diabetes Maternal Grandfather    Colon polyps Paternal Grandmother    Stomach cancer Paternal Grandmother        dx 35s-80s   Colon polyps Paternal Grandfather    Arthritis Other    Prostate cancer Other        grandfather per pt but she is unaware of which grandfather   Coronary artery disease Other    Pancreatic cancer Neg Hx    Esophageal cancer Neg Hx    Rectal cancer Neg Hx      HOME MEDICATIONS: Allergies as of 08/10/2023       Reactions   Lisinopril  Cough        Medication List        Accurate as of August 10, 2023  2:25 PM. If you have any questions, ask your nurse or doctor.           albuterol  108 (90 Base) MCG/ACT inhaler Commonly known as: VENTOLIN  HFA Inhale 2 puffs into the lungs every 6 (six) hours as needed for wheezing or shortness of breath.   ALPRAZolam  1 MG tablet Commonly known as: XANAX  Take 1 mg by mouth at bedtime as needed for anxiety.   ARIPiprazole  2 MG tablet Commonly known as: Abilify  Take 1 tablet (2 mg total) by mouth daily.   atorvastatin  80 MG tablet Commonly known as: LIPITOR  Take 1 tablet (80 mg total) by mouth daily. What changed: when to take this   glimepiride  1 MG tablet Commonly known as: AMARYL  Take 1 tablet (1 mg total) by mouth daily with breakfast.   losartan -hydrochlorothiazide  50-12.5 MG tablet Commonly known as: HYZAAR Take 1 tablet by mouth daily. What changed: when to take this   meloxicam  7.5 MG tablet Commonly known as: MOBIC  Take by mouth.   metFORMIN  500 MG 24 hr tablet Commonly known as: GLUCOPHAGE -XR Take 2 tablets (1,000 mg total) by mouth daily with breakfast. What changed: how much to take Changed by: Jazzmon Prindle J Maicey Barrientez   metoprolol  tartrate 50 MG tablet Commonly known as: LOPRESSOR  Take 1 tablet (50 mg total) by mouth 2 (two) times daily.   omeprazole  40 MG capsule Commonly known as: PRILOSEC Take 1 capsule (40 mg total) by mouth 2 (two) times daily.   Rybelsus  14 MG Tabs Generic drug: Semaglutide  Take 1 tablet (14 mg total) by mouth daily.   venlafaxine  XR 150 MG 24 hr capsule Commonly known as: EFFEXOR -XR Take 1 capsule (150 mg total) by mouth daily.         OBJECTIVE:   Vital Signs: BP 112/72 (BP Location: Left Arm, Patient Position: Sitting, Cuff Size: Normal)   Pulse 76   Ht 5\' 4"  (1.626 m)   Wt 228 lb (103.4 kg)   LMP 03/22/1986   SpO2 98%   BMI 39.14 kg/m   Wt Readings from Last 3 Encounters:  08/10/23 228 lb (103.4 kg)  07/29/23 238 lb (108 kg)  07/21/23 227 lb (103 kg)     Exam: General: Pt appears well and is in NAD  Lungs: Clear with good BS bilat    Heart: RRR   Neuro: MS is good with appropriate affect, pt is alert and Ox3      DM Foot exam 02/06/2023  The skin of the  feet is intact without sores or ulcerations. The pedal pulses are 1+ on right and 1+ on left. The sensation is absent  to a screening 5.07, 10 gram monofilament bilaterally    DATA REVIEWED:  Lab Results  Component Value Date   HGBA1C 7.7 (H) 07/13/2023   HGBA1C 5.9 (H) 12/29/2022   HGBA1C 8.1 (A) 08/11/2022    Latest Reference Range & Units 07/14/23 01:51  Sodium 135 - 145 mmol/L 136  Potassium 3.5 - 5.1 mmol/L 3.6  Chloride 98 - 111 mmol/L 102  CO2 22 - 32 mmol/L 23  Glucose 70 - 99 mg/dL 841 (H)  BUN 6 - 20 mg/dL 17  Creatinine 3.24 - 4.01 mg/dL 0.27  Calcium  8.9 - 10.3 mg/dL 9.3  Anion gap 5 - 15  11  Alkaline Phosphatase 38 - 126 U/L 69  Albumin 3.5 - 5.0 g/dL 4.0  AST 15 - 41 U/L 23  ALT 0 - 44 U/L 32  Total Protein 6.5 - 8.1 g/dL 7.8  Total Bilirubin 0.0 - 1.2 mg/dL 0.7  GFR, Estimated >25 mL/min >60     ASSESSMENT / PLAN / RECOMMENDATIONS:   1) Type 2 Diabetes Mellitus, Sub-Optimally  Controlled, With Neuropathic  complications - Most recent A1c of 7.7 %. Goal A1c < 7.0 %.   -A1c has increased, barriers to diabetes self-care is mental health issues -I will increase metformin  again   MEDICATIONS:  -Continue Glimepiride  1 mg daily  -Increase Metformin  500 mg XR, 2 tablets daily  -Continue Rybelsus  14 mg daily    EDUCATION / INSTRUCTIONS: BG monitoring instructions: Patient is instructed to check her blood sugars 3 times a week, fasting. Call [facility] if: BG persistently < 70  I reviewed the Rule of 15 for the treatment of hypoglycemia in detail with the patient. Literature supplied.    2) Diabetic complications:  Eye: Does not  have known diabetic retinopathy.  Neuro/ Feet: Does have known diabetic peripheral neuropathy. Renal: Patient does not have known baseline CKD.         F/U in 4 months     Signed electronically by: Natale Bail, MD  [facility] End[facility]  [facility] [address] [place] [place], [place] [address] Phone: [phone] FAX: [phone]   CC: Donley Furth, MD [address] [place] [address] Phone: [phone]  Fax: [phone]  Return to Endocrinology clinic as below: Future Appointments  Date Time Provider Department Center  08/18/2023 10:00 AM Cecile Coder, [facility] None  08/19/2023 10:00 AM Armbruster, Lendon Queen, MD [facility]  09/01/2023 10:00 AM Cecile Coder, [facility] None

## 2023-08-10 NOTE — Patient Instructions (Signed)
-   Continue  Glimepiride  1 mg , 1 tablet before lunch  - Increase Metformin  500 mg, 2 tablet daily - Continue Rybelsus  14 mg , 1 tablet every morning    HOW TO TREAT LOW BLOOD SUGARS (Blood sugar LESS THAN 70 MG/DL) Please follow the RULE OF 15 for the treatment of hypoglycemia treatment (when your (blood sugars are less than 70 mg/dL)   STEP 1: Take 15 grams of carbohydrates when your blood sugar is low, which includes:  3-4 GLUCOSE TABS  OR 3-4 OZ OF JUICE OR REGULAR SODA OR ONE TUBE OF GLUCOSE GEL    STEP 2: RECHECK blood sugar in 15 MINUTES STEP 3: If your blood sugar is still low at the 15 minute recheck --> then, go back to STEP 1 and treat AGAIN with another 15 grams of carbohydrates.

## 2023-08-11 ENCOUNTER — Other Ambulatory Visit: Payer: Self-pay

## 2023-08-11 MED ORDER — GLIMEPIRIDE 1 MG PO TABS: 1.0000 mg | ORAL_TABLET | Freq: Every day | ORAL | 3 refills | Status: DC

## 2023-08-18 ENCOUNTER — Encounter: Payer: Self-pay | Admitting: Behavioral Health

## 2023-08-18 ENCOUNTER — Ambulatory Visit: Payer: Medicaid Other | Admitting: Behavioral Health

## 2023-08-18 DIAGNOSIS — F411 Generalized anxiety disorder: Secondary | ICD-10-CM

## 2023-08-18 DIAGNOSIS — F331 Major depressive disorder, recurrent, moderate: Secondary | ICD-10-CM

## 2023-08-18 DIAGNOSIS — R454 Irritability and anger: Secondary | ICD-10-CM

## 2023-08-18 DIAGNOSIS — F332 Major depressive disorder, recurrent severe without psychotic features: Secondary | ICD-10-CM

## 2023-08-18 NOTE — Progress Notes (Addendum)
 [facility] Counselor/Therapist Progress Note  Patient ID: Morgan [name], MRN: [id number],    Date: 08/18/2023  Time Spent: 48 minutes, 10 AM until 10:48 AM spent face-to-face with the patient in the outpatient therapist office.  Treatment Type: Individual Therapy  Reported Symptoms: Anxiety, depression, irritability  Mental Status Exam: Appearance:  Casual     Behavior: Appropriate  Motor: Normal  Speech/Language:  Normal Rate  Affect: Appropriate  Mood: normal  Thought process: normal  Thought content:   WNL  Sensory/Perceptual disturbances:   WNL  Orientation: oriented to person, place, time/date, situation, day of week, month of year, and year  Attention: Good  Concentration: Good  Memory: WNL  Fund of knowledge:  Good  Insight:   Good  Judgment:  Good  Impulse Control: Good   Risk Assessment: Danger to Self:  The patient has had thoughts of being overwhelmed but says she will contract for safety not hurt herself or anyone else. Self-injurious Behavior: No Danger to Others: No Duty to Warn:no Physical Aggression / Violence:No  Access to Firearms a concern: No  Gang Involvement:No   Subjective: The patient does not feel that the medication she is on is helping so I encouraged her to speak to her doctor about it.  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  She loves the patient but the patient's daughter is not nice to her. She said she stayed calm and did not yell or yell at her over the phone when that the patient's daughter was not kind to her.  The second daughter was able to come and alleviate the situation.   We emphasized mindfulness to be aware of her reaction when she is working on we also talked about separating the feels and what is in terms of a personal attack in that particular situation.  We also looked at the 5 love languages as a way to understand how people interact with and show each other  that they care about each other.  She is excited that she is going to do an activity with her church friends this weekend so she is stepping out a little bit.  She is meeting with another home care agency next week to see about a possible job but is also awaiting option of moving closer to her mother and sister.  There is some financial stress in the interim. She does contract for safety having no thoughts of hurting herself or anyone else.  Interventions: Cognitive Behavioral Therapy and Dialectical Behavioral Therapy  Diagnosis: Generalized anxiety disorder, major depressive disorder,recurrent,severe. Irritability  Plan: I will meet with the patient every 2 to 3 weeks in person.  Treatment plan: We will use cognitive behavioral therapy principles as well as elements of dialectical behavior therapy to reduce the patient's anxiety depression and irritability by at least 50% with a target goal of Nov 27, 2023.  Goals for her depression will be for the patient to have less sadness as indicated by patient report and PHQ-9 scores, have improved mood and return to a healthier level of functioning as well as identify causes for depressed mood and learn ways to reduce depression and using a crisis plan if patient has active suicidal thoughts.  Interventions include using cognitive behavioral therapy to explore and replace unhealthy thought and behavior patterns contributing to depression, provide education about depression to help her understand its causes, teach and encouraged use of coping skills to reduce depression and make a  crisis plan and assess ongoing level of safety.  Goals for reducing anxiety include improving her ability to manage anxiety/stress symptoms, identify causes for anxiety and stress and explore ways to reduce it, resolve core conflicts including family history contributing to it and help her manage thoughts and worrisome thinking contributing to feelings of anxiety.  Interventions for  reducing anxiety include providing education about anxiety to help around identify its causes symptoms and triggers, facilitate problem solution skills and coping skills for reducing anxiety.  We will also use cognitive behavioral therapy to identify and change anxiety provoking thought and behavior patterns as well as use dialectical behavior therapy to reduce anxiety through distress tolerance and mindfulness skills.  Goals for reducing irritability/anger include helping the patient felt an awareness of anger behaviors and alternatives to using aggressive behavior.  We will also look at learning ways to handle angry feelings in constructive ways that cannot hands daily functioning for the patient.  Interventions including using dialectical behavior therapy mindfulness skills, cognitive behavioral therapy to identify negative consequences for expressing anger and irritability and unhealthy ways.  We will also use cognitive behavioral therapy to explore and replace unhealthy thought and behavior patterns contributing to feelings of resentment and disappointment.  We will identify core values that may not being met that are contributing to feelings of anger.  We will confront and reflects the patient's behaviors that happened when both angry and out of session.  We will assist the patient in looking at how anger was modeled for her and how this positively or negatively impacted her.  We will also teach respectively assertive communication skills as well as self soothing skills to help the patient better manage anger reactions.  Cecile Coder, [facility]                  Cecile Coder, [facility]               Cecile Coder, [facility]               Cecile Coder, [facility]                Cecile Coder, [facility]

## 2023-08-19 ENCOUNTER — Ambulatory Visit (AMBULATORY_SURGERY_CENTER): Payer: Medicaid Other | Admitting: Gastroenterology

## 2023-08-19 ENCOUNTER — Encounter: Payer: Self-pay | Admitting: Gastroenterology

## 2023-08-19 VITALS — BP 101/57 | HR 79 | Temp 97.7°F | Resp 16 | Ht 64.0 in | Wt 228.0 lb

## 2023-08-19 DIAGNOSIS — T182XXA Foreign body in stomach, initial encounter: Secondary | ICD-10-CM

## 2023-08-19 DIAGNOSIS — F419 Anxiety disorder, unspecified: Secondary | ICD-10-CM | POA: Diagnosis not present

## 2023-08-19 DIAGNOSIS — Z13810 Encounter for screening for upper gastrointestinal disorder: Secondary | ICD-10-CM

## 2023-08-19 DIAGNOSIS — K219 Gastro-esophageal reflux disease without esophagitis: Secondary | ICD-10-CM | POA: Diagnosis not present

## 2023-08-19 DIAGNOSIS — E119 Type 2 diabetes mellitus without complications: Secondary | ICD-10-CM | POA: Diagnosis not present

## 2023-08-19 DIAGNOSIS — G4730 Sleep apnea, unspecified: Secondary | ICD-10-CM | POA: Diagnosis not present

## 2023-08-19 DIAGNOSIS — I10 Essential (primary) hypertension: Secondary | ICD-10-CM | POA: Diagnosis not present

## 2023-08-19 DIAGNOSIS — F32A Depression, unspecified: Secondary | ICD-10-CM | POA: Diagnosis not present

## 2023-08-19 MED ORDER — SODIUM CHLORIDE 0.9 % IV SOLN: 500.0000 mL | Freq: Once | INTRAVENOUS | Status: AC

## 2023-08-19 NOTE — Progress Notes (Signed)
 To pacu, VSS. Report to [email or web address]

## 2023-08-19 NOTE — Op Note (Signed)
[facility] Patient Name: Morgan [name] Procedure Date: 08/19/2023 9:10 AM MRN: [id number] Endoscopist: Viviann Spare P. Adela Lank , MD, [id number] Age: 58 Referring MD:  Date of Birth: Feb 29, 1966 Gender: Female Account #: [id number] Procedure:                Upper GI endoscopy Indications:              Screening for Barrett's esophagus, history of                            gastro-esophageal reflux disease - on omeprazole                            40mg  twice daily to control symptoms. History of DM                            on Rybelsus which was held for this exam Medicines:                Monitored Anesthesia Care Procedure:                Pre-Anesthesia Assessment:                           - Prior to the procedure, a History and Physical                            was performed, and patient medications and                            allergies were reviewed. The patient's tolerance of                            previous anesthesia was also reviewed. The risks                            and benefits of the procedure and the sedation                            options and risks were discussed with the patient.                            All questions were answered, and informed consent                            was obtained. Prior Anticoagulants: The patient has                            taken no anticoagulant or antiplatelet agents. ASA                            Grade Assessment: III - A patient with severe                            systemic disease. After reviewing the risks and  benefits, the patient was deemed in satisfactory                            condition to undergo the procedure.                           After obtaining informed consent, the endoscope was                            passed under direct vision. Throughout the                            procedure, the patient's blood pressure, pulse, and                             oxygen saturations were monitored continuously. The                            Olympus Scope [id number] was introduced through the                            mouth, with the intention of advancing to the                            duodenum. The scope was advanced to the gastric                            body before the procedure was aborted. Medications                            were given. The upper GI endoscopy was accomplished                            without difficulty. The patient tolerated the                            procedure well. Scope In: Scope Out: Findings:                 The Z-line was regular. No Barrett's esophagus.                           The exam of the esophagus was otherwise normal.                           A large amount of food (residue) was found in the                            gastric body. Once this was noted the procedure was                            aborted due to aspiration risk. Exam of the stomach                            /  duodenum not performed. Complications:            No immediate complications. Estimated blood loss:                            None. Estimated Blood Loss:     Estimated blood loss: none. Impression:               - Z-line regular. No Barrett's                           - Normal esophagus otherwise.                           - A large amount of food (residue) in the stomach -                            exam of the stomach and duodenum not performed due                            to aspiration risk.                           Fortunately no Barrett's esophagus. Patient may                            likely have delayed gastric emptying from Rybelsus                            and / or underlying diabetes. CT scan < 1 year ago                            abdomen / pelvis showed no abnormalities of the                            stomach. Recommendation:           - Patient has a contact number available for                             emergencies. The signs and symptoms of potential                            delayed complications were discussed with the                            patient. Return to normal activities tomorrow.                            Written discharge instructions were provided to the                            patient.                           - Resume previous diet.                           -  Continue present medications (omeprazole twice                            daily) - long term use lowest dose needed to                            control symptoms                           - If symptoms of nausea / vomiting / early satiety,                            would discuss with her PCP if she wishes to                            continue Rybelsus or not. Viviann Spare P. Shuree Brossart, MD 08/19/2023 9:24:07 AM This report has been signed electronically.

## 2023-08-19 NOTE — Progress Notes (Signed)
 Pt's states no medical or surgical changes since previsit or office visit.

## 2023-08-19 NOTE — Patient Instructions (Addendum)
YOU HAD AN ENDOSCOPIC PROCEDURE TODAY AT THE [facility]:   Refer to the procedure report that was given to you for any specific questions about what was found during the examination.  If the procedure report does not answer your questions, please call your gastroenterologist to clarify.  If you requested that your care partner not be given the details of your procedure findings, then the procedure report has been included in a sealed envelope for you to review at your convenience later.  YOU SHOULD EXPECT: Some feelings of bloating in the abdomen. Passage of more gas than usual.  Walking can help get rid of the air that was put into your GI tract during the procedure and reduce the bloating. If you had a lower endoscopy (such as a colonoscopy or flexible sigmoidoscopy) you may notice spotting of blood in your stool or on the toilet paper. If you underwent a bowel prep for your procedure, you may not have a normal bowel movement for a few days.  Please Note:  You might notice some irritation and congestion in your nose or some drainage.  This is from the oxygen used during your procedure.  There is no need for concern and it should clear up in a day or so.  SYMPTOMS TO REPORT IMMEDIATELY:   Following upper endoscopy (EGD)  Vomiting of blood or coffee ground material  New chest pain or pain under the shoulder blades  Painful or persistently difficult swallowing  New shortness of breath  Fever of 100F or higher  Black, tarry-looking stools  For urgent or emergent issues, a gastroenterologist can be reached at any hour by calling [phone]. Do not use MyChart messaging for urgent concerns.    DIET:  We do recommend a small meal at first, but then you may proceed to your regular diet.  Drink plenty of fluids but you should avoid alcoholic beverages for 24 hours.  MEDICATIONS: Continue present medications (including Omeprazole twice daily) - long term use lowest dose needed to  control symptoms.  FOLLOW UP: If symptoms of nausea/vomiting/early satiety, would discuss with her primary care physician if she wishes to continue Rybelsus or not.  ACTIVITY:  You should plan to take it easy for the rest of today and you should NOT DRIVE or use heavy machinery until tomorrow (because of the sedation medicines used during the test).    FOLLOW UP: Our staff will call the number listed on your records the next business day following your procedure.  We will call around 7:15- 8:00 am to check on you and address any questions or concerns that you may have regarding the information given to you following your procedure. If we do not reach you, we will leave a message.     If any biopsies were taken you will be contacted by phone or by letter within the next 1-3 weeks.  Please call us at [phone] if you have not heard about the biopsies in 3 weeks.    SIGNATURES/CONFIDENTIALITY: You and/or your care partner have signed paperwork which will be entered into your electronic medical record.  These signatures attest to the fact that that the information above on your After Visit Summary has been reviewed and is understood.  Full responsibility of the confidentiality of this discharge information lies with you and/or your care-partner.

## 2023-08-19 NOTE — Progress Notes (Signed)
[facility] History and Physical   Primary Care Physician:  Nelwyn Salisbury, MD   Reason for Procedure:   GERD - screening for Barrett's  Plan:    EGD     HPI: Morgan [name] is a 58 y.o. female  here for EGD to screen for Barrett's - on omeprazole 40mg  BID, has  bothersome symptoms if she misses a dose. No prior EGD. Meets criteria for screening for Barrett's. EGD to further evaluate.    Otherwise feels well without any cardiopulmonary symptoms.   I have discussed risks / benefits of anesthesia and endoscopic procedure with Morgan [name] and they wish to proceed with the exams as outlined today.    Past Medical History:  Diagnosis Date   Anal fissure    saw Dr. Loretha Brasil    Anxiety    on meds   Arthritis    on meds   Asthma    uses inhaler if needed   Carpal tunnel syndrome    Depression    on meds   Diabetes mellitus    on meds   GERD (gastroesophageal reflux disease)    on meds   History of ovarian cancer in adulthood    Hyperlipidemia    on meds   Hypertension    on meds   Ovarian cancer [facility]) 1986   1996   Skin cancer (melanoma) (HCC) 1997   Sleep apnea    uses CPAP    Tubular adenoma of colon 01/2014    Past Surgical History:  Procedure Laterality Date   ABDOMINAL HYSTERECTOMY  06/30/1984   BSO   BASAL CELL CARCINOMA EXCISION  10/09/2012   lower lip per Dr. Park Liter    COLONOSCOPY  03/23/2019   per Dr. Adela Lank, adenomatous polyps, repeat in 3 yrs   GLBS tumor ovary 1986     HEMORRHOID SURGERY     HIP FRACTURE SURGERY Right 2007   MELANOMA EXCISION  07/01/1995   upper chest     Prior to Admission medications   Medication Sig Start Date End Date Taking? Authorizing Provider  ALPRAZolam Prudy Feeler) 1 MG tablet Take 1 mg by mouth at bedtime as needed for anxiety.   Yes [provider]  ARIPiprazole (ABILIFY) 2 MG tablet Take 1 tablet (2 mg total) by mouth daily. 08/06/23  Yes Nelwyn Salisbury, MD   atorvastatin (LIPITOR) 80 MG tablet Take 1 tablet (80 mg total) by mouth daily. Patient taking differently: Take 80 mg by mouth at bedtime. 04/03/23  Yes Nelwyn Salisbury, MD  losartan-hydrochlorothiazide (HYZAAR) 50-12.5 MG tablet Take 1 tablet by mouth daily. Patient taking differently: Take 1 tablet by mouth at bedtime. 04/23/23  Yes Nelwyn Salisbury, MD  metFORMIN (GLUCOPHAGE-XR) 500 MG 24 hr tablet Take 2 tablets (1,000 mg total) by mouth daily with breakfast. 08/10/23  Yes Shamleffer, Konrad Dolores, MD  albuterol (VENTOLIN HFA) 108 (90 Base) MCG/ACT inhaler Inhale 2 puffs into the lungs every 6 (six) hours as needed for wheezing or shortness of breath. 01/08/21   Coralyn Helling, MD  glimepiride (AMARYL) 1 MG tablet Take 1 tablet (1 mg total) by mouth daily with breakfast. 08/11/23   Shamleffer, Konrad Dolores, MD  metoprolol tartrate (LOPRESSOR) 50 MG tablet Take 1 tablet (50 mg total) by mouth 2 (two) times daily. 07/21/23   Nelwyn Salisbury, MD  omeprazole (PRILOSEC) 40 MG capsule Take 1 capsule (40 mg total) by mouth 2 (two) times daily. 07/08/23   Meredith Pel, NP  Semaglutide (RYBELSUS) 14 MG TABS Take 1 tablet (14 mg total) by mouth daily. 08/10/23   Shamleffer, Konrad Dolores, MD  venlafaxine XR (EFFEXOR-XR) 150 MG 24 hr capsule Take 1 capsule (150 mg total) by mouth daily. 05/04/23   Nelwyn Salisbury, MD    Current Outpatient Medications  Medication Sig Dispense Refill   ALPRAZolam (XANAX) 1 MG tablet Take 1 mg by mouth at bedtime as needed for anxiety.     ARIPiprazole (ABILIFY) 2 MG tablet Take 1 tablet (2 mg total) by mouth daily. 30 tablet 2   atorvastatin (LIPITOR) 80 MG tablet Take 1 tablet (80 mg total) by mouth daily. (Patient taking differently: Take 80 mg by mouth at bedtime.) 90 tablet 1   losartan-hydrochlorothiazide (HYZAAR) 50-12.5 MG tablet Take 1 tablet by mouth daily. (Patient taking differently: Take 1 tablet by mouth at bedtime.) 90 tablet 1   metFORMIN (GLUCOPHAGE-XR) 500  MG 24 hr tablet Take 2 tablets (1,000 mg total) by mouth daily with breakfast. 180 tablet 3   albuterol (VENTOLIN HFA) 108 (90 Base) MCG/ACT inhaler Inhale 2 puffs into the lungs every 6 (six) hours as needed for wheezing or shortness of breath. 8 g 6   glimepiride (AMARYL) 1 MG tablet Take 1 tablet (1 mg total) by mouth daily with breakfast. 90 tablet 3   metoprolol tartrate (LOPRESSOR) 50 MG tablet Take 1 tablet (50 mg total) by mouth 2 (two) times daily. 180 tablet 3   omeprazole (PRILOSEC) 40 MG capsule Take 1 capsule (40 mg total) by mouth 2 (two) times daily. 60 capsule 11   Semaglutide (RYBELSUS) 14 MG TABS Take 1 tablet (14 mg total) by mouth daily. 90 tablet 3   venlafaxine XR (EFFEXOR-XR) 150 MG 24 hr capsule Take 1 capsule (150 mg total) by mouth daily. 90 capsule 1   Current Facility-Administered Medications  Medication Dose Route Frequency Provider Last Rate Last Admin   0.9 %  sodium chloride infusion  500 mL Intravenous Once Alizah Sills, Willaim Rayas, MD        Allergies as of 08/19/2023 - Review Complete 08/19/2023  Allergen Reaction Noted   Lisinopril Cough 11/07/2011    Family History  Problem Relation Age of Onset   Dementia Mother    Asthma Mother    Multiple sclerosis Father    Uterine cancer Sister 19   Diabetes Brother    Diabetes Brother    Basal cell carcinoma Brother        lower lip   Diabetes Maternal Grandfather    Colon cancer Paternal Grandmother    Colon polyps Paternal Grandmother    Stomach cancer Paternal Grandmother        dx 42s-80s   Colon polyps Paternal Grandfather    Arthritis Other    Prostate cancer Other        grandfather per pt but she is unaware of which grandfather   Coronary artery disease Other    Pancreatic cancer Neg Hx    Esophageal cancer Neg Hx    Rectal cancer Neg Hx     Social History   Socioeconomic History   Marital status: Single    Spouse name: Not on file   Number of children: Not on file   Years of education:  Not on file   Highest education level: Not on file  Occupational History   Occupation: billing     Employer: [employer]  Tobacco Use   Smoking status: Some Days    Types: Cigarettes  Smokeless tobacco: Never   Tobacco comments:    Patient states she smokes about 1-2 cigarettes a day some days.  Updated 03/23/2023. am  Vaping Use   Vaping status: Never Used  Substance and Sexual Activity   Alcohol use: No    Alcohol/week: 0.0 standard drinks of alcohol   Drug use: No   Sexual activity: Yes    Birth control/protection: Surgical  Other Topics Concern   Not on file  Social History Narrative   Originally from [place], [place], & moved to [place] at 58 y.o. She reports she started a new job in July 2016. 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 No pets currently. No bird, mold, or hot tub exposure.       Right Handed    Lives in a one story home    Social Drivers of Health   Financial Resource Strain: Not on file  Food Insecurity: No Food Insecurity (07/14/2023)   Hunger Vital Sign    Worried About Running Out of Food in the Last Year: Never true    Ran Out of Food in the Last Year: Never true  Transportation Needs: No Transportation Needs (07/14/2023)   PRAPARE - Administrator, Civil Service (Medical): No    Lack of Transportation (Non-Medical): No  Physical Activity: Not on file  Stress: Stress Concern Present (04/27/2023)   [employer] of Occupational Health - Occupational Stress Questionnaire    Feeling of Stress : Very much  Social Connections: Moderately Integrated (07/14/2023)   Social Connection and Isolation Panel [NHANES]    Frequency of Communication with Friends and Family: Twice a week    Frequency of Social Gatherings with Friends and Family: Once a week    Attends Religious Services: More  than 4 times per year    Active Member of [employer] or Organizations: Yes    Attends Banker Meetings: More than 4 times per year    Marital Status: Never married  Intimate Partner Violence: Not At Risk (07/14/2023)   Humiliation, Afraid, Rape, and Kick questionnaire    Fear of Current or Ex-Partner: No    Emotionally Abused: No    Physically Abused: No    Sexually Abused: No    Review of Systems: All other review of systems negative except as mentioned in the HPI.  Physical Exam: Vital signs BP 129/81   Pulse 89   Temp 97.7 F (36.5 C) (Temporal)   Ht 5\' 4"  (1.626 m)   Wt 228 lb (103.4 kg)   LMP 03/22/1986   SpO2 97%   BMI 39.14 kg/m   General:   Alert,  Well-developed, pleasant and cooperative in NAD Lungs:  Clear throughout to auscultation.   Heart:  Regular rate and rhythm Abdomen:  Soft, nontender and nondistended.   Neuro/Psych:  Alert and cooperative. Normal mood and affect. A and O x 3  Harlin Rain, MD [facility]

## 2023-08-20 ENCOUNTER — Encounter: Payer: Self-pay | Admitting: Family Medicine

## 2023-08-20 ENCOUNTER — Telehealth: Payer: Self-pay | Admitting: *Deleted

## 2023-08-20 NOTE — Telephone Encounter (Signed)
  Follow up Call-     08/19/2023    8:33 AM 03/07/2022    8:53 AM  Call back number  Post procedure Call Back phone  # [phone]  Permission to leave phone message Yes Yes     Patient questions:  Do you have a fever, pain , or abdominal swelling? No. Pain Score  0 *  Have you tolerated food without any problems? Yes.    Have you been able to return to your normal activities? Yes.    Do you have any questions about your discharge instructions: Diet   No. Medications  No. Follow up visit  No.  Do you have questions or concerns about your Care? No.  Actions: * If pain score is 4 or above: No action needed, pain <4.

## 2023-08-29 ENCOUNTER — Encounter: Payer: Self-pay | Admitting: Behavioral Health

## 2023-08-30 ENCOUNTER — Other Ambulatory Visit: Payer: Self-pay | Admitting: Family Medicine

## 2023-09-01 ENCOUNTER — Ambulatory Visit: Payer: Medicaid Other | Admitting: Behavioral Health

## 2023-09-01 DIAGNOSIS — F411 Generalized anxiety disorder: Secondary | ICD-10-CM

## 2023-09-01 DIAGNOSIS — R454 Irritability and anger: Secondary | ICD-10-CM | POA: Diagnosis not present

## 2023-09-01 DIAGNOSIS — F331 Major depressive disorder, recurrent, moderate: Secondary | ICD-10-CM | POA: Diagnosis not present

## 2023-09-01 NOTE — Progress Notes (Addendum)
 [facility] Counselor/Therapist Progress Note  Patient ID: Morgan [name], MRN: [id number],    Date: 09/01/2023  Time Spent: 4 PM until 4:55 PM, 55 spent face-to-face with the patient in the outpatient therapist office.  Treatment Type: Individual Therapy  Reported Symptoms: Anxiety, depression, irritability  Mental Status Exam: Appearance:  Casual     Behavior: Appropriate  Motor: Normal  Speech/Language:  Normal Rate  Affect: Appropriate  Mood: normal  Thought process: normal  Thought content:   WNL  Sensory/Perceptual disturbances:   WNL  Orientation: oriented to person, place, time/date, situation, day of week, month of year, and year  Attention: Good  Concentration: Good  Memory: WNL  Fund of knowledge:  Good  Insight:   Good  Judgment:  Good  Impulse Control: Good   Risk Assessment: Danger to Self:  The patient has had thoughts of being overwhelmed but says she will contract for safety not hurt herself or anyone else. 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  She reached out to another contact that she had and had a couple of more interviews coming up.  She recognized that there is significant financial stress and she is still wrestling on whether to move closer to her sister or stay in this area.  She had a very difficult conversation with her mother who had dementia and her mother was very rude to her telling her she did not want to talk to her anymore.  .  We talked about trying to separate herself from that knowing that she had always been good to her mother and it was the dementia speaking.  I encouraged her to continue to stay positively focused so  she presented well for her interviews to continue to pray and to be active with her church and her church friends.  She also knows she can call [phone] if she becomes overwhelmed.  Working on getting her schedule as close to weekly as possible.  She does contract for safety having no thoughts of hurting herself or anyone else.  Interventions: Cognitive Behavioral Therapy and Dialectical Behavioral Therapy  Diagnosis: Generalized anxiety disorder, major depressive disorder,recurrent,severe. Irritability  Plan: I will meet with the patient every 2 to 3 weeks in person.  Treatment plan: We will use cognitive behavioral therapy principles as well as elements of dialectical behavior therapy to reduce the patient's anxiety depression and irritability by at least 50% with a target goal of May 29, 2024.  Goals for her depression will be for the patient to have less sadness as indicated by patient report and PHQ-9 scores, have improved mood and return to a healthier level of functioning as well as identify causes for depressed mood and learn ways to reduce depression and using a crisis plan if patient has active suicidal thoughts.  Interventions include using cognitive behavioral therapy to explore and replace unhealthy thought and behavior patterns contributing to depression, provide education about depression to help her understand its causes, teach and encouraged use of coping skills to reduce depression and make a crisis plan and assess ongoing level of safety.  Goals for reducing anxiety include improving her ability to manage anxiety/stress symptoms, identify causes for  anxiety and stress and explore ways to reduce it, resolve core conflicts including family history contributing to it and help her manage thoughts and worrisome thinking contributing to feelings of anxiety.  Interventions for reducing anxiety include providing education about anxiety to help around identify its causes symptoms and triggers,  facilitate problem solution skills and coping skills for reducing anxiety.  We will also use cognitive behavioral therapy to identify and change anxiety provoking thought and behavior patterns as well as use dialectical behavior therapy to reduce anxiety through distress tolerance and mindfulness skills.  Goals for reducing irritability/anger include helping the patient felt an awareness of anger behaviors and alternatives to using aggressive behavior.  We will also look at learning ways to handle angry feelings in constructive ways that cannot hands daily functioning for the patient.  Interventions including using dialectical behavior therapy mindfulness skills, cognitive behavioral therapy to identify negative consequences for expressing anger and irritability and unhealthy ways.  We will also use cognitive behavioral therapy to explore and replace unhealthy thought and behavior patterns contributing to feelings of resentment and disappointment.  We will identify core values that may not being met that are contributing to feelings of anger.  We will confront and reflects the patient's behaviors that happened when both angry and out of session.  We will assist the patient in looking at how anger was modeled for her and how this positively or negatively impacted her.  We will also teach respectively assertive communication skills as well as self soothing skills to help the patient better manage anger reactions.  Cecile Coder, [facility]                  Cecile Coder, [facility]               Cecile Coder, [facility]               Cecile Coder, [facility]                Cecile Coder, [facility]               Cecile Coder, [facility]

## 2023-09-03 ENCOUNTER — Encounter: Payer: Self-pay | Admitting: Behavioral Health

## 2023-09-04 DIAGNOSIS — G4733 Obstructive sleep apnea (adult) (pediatric): Secondary | ICD-10-CM | POA: Diagnosis not present

## 2023-09-07 DIAGNOSIS — G4733 Obstructive sleep apnea (adult) (pediatric): Secondary | ICD-10-CM | POA: Diagnosis not present

## 2023-09-09 MED ORDER — HYDROXYZINE PAMOATE 25 MG PO CAPS: 25.0000 mg | ORAL_CAPSULE | Freq: Four times a day (QID) | ORAL | 2 refills | Status: AC | PRN

## 2023-09-09 NOTE — Telephone Encounter (Signed)
 Done

## 2023-09-10 DIAGNOSIS — M542 Cervicalgia: Secondary | ICD-10-CM | POA: Diagnosis not present

## 2023-09-10 DIAGNOSIS — S0990XA Unspecified injury of head, initial encounter: Secondary | ICD-10-CM | POA: Diagnosis not present

## 2023-09-10 DIAGNOSIS — R55 Syncope and collapse: Secondary | ICD-10-CM | POA: Diagnosis not present

## 2023-09-11 ENCOUNTER — Encounter: Payer: Self-pay | Admitting: Behavioral Health

## 2023-09-11 ENCOUNTER — Encounter: Payer: Self-pay | Admitting: Family Medicine

## 2023-09-13 ENCOUNTER — Encounter: Payer: Self-pay | Admitting: Obstetrics and Gynecology

## 2023-09-13 ENCOUNTER — Encounter: Payer: Self-pay | Admitting: Behavioral Health

## 2023-09-14 ENCOUNTER — Encounter: Payer: Self-pay | Admitting: Behavioral Health

## 2023-09-14 ENCOUNTER — Ambulatory Visit: Admitting: Behavioral Health

## 2023-09-14 DIAGNOSIS — R454 Irritability and anger: Secondary | ICD-10-CM | POA: Diagnosis not present

## 2023-09-14 DIAGNOSIS — F411 Generalized anxiety disorder: Secondary | ICD-10-CM | POA: Diagnosis not present

## 2023-09-14 DIAGNOSIS — F332 Major depressive disorder, recurrent severe without psychotic features: Secondary | ICD-10-CM

## 2023-09-14 MED ORDER — FLUCONAZOLE 150 MG PO TABS
150.0000 mg | ORAL_TABLET | Freq: Once | ORAL | 0 refills | Status: AC
Start: 2023-09-14 — End: 2023-09-14

## 2023-09-14 NOTE — Telephone Encounter (Signed)
 She is welcome to see me if she wishes, but there is no need to. All her scans and blood work were normal.

## 2023-09-14 NOTE — Telephone Encounter (Signed)
 Yes I saw all her labs and Xray results. We can print them out for her if she wishes
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
## 2023-09-21 ENCOUNTER — Ambulatory Visit: Payer: Medicaid Other | Admitting: Primary Care

## 2023-09-22 ENCOUNTER — Ambulatory Visit: Payer: Medicaid Other | Admitting: Behavioral Health

## 2023-09-28 ENCOUNTER — Ambulatory Visit: Admitting: Orthopaedic Surgery

## 2023-09-28 NOTE — Telephone Encounter (Signed)
 I will take care of this at her OV

## 2023-09-29 ENCOUNTER — Encounter: Payer: Self-pay | Admitting: Family Medicine

## 2023-09-29 ENCOUNTER — Ambulatory Visit (INDEPENDENT_AMBULATORY_CARE_PROVIDER_SITE_OTHER): Admitting: Family Medicine

## 2023-09-29 DIAGNOSIS — G4733 Obstructive sleep apnea (adult) (pediatric): Secondary | ICD-10-CM | POA: Diagnosis not present

## 2023-09-29 DIAGNOSIS — R413 Other amnesia: Secondary | ICD-10-CM | POA: Diagnosis not present
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
## 2023-09-30 ENCOUNTER — Telehealth: Payer: Self-pay

## 2023-09-30 ENCOUNTER — Encounter: Payer: Self-pay | Admitting: Family Medicine

## 2023-09-30 NOTE — Telephone Encounter (Signed)
 Patient called back and was needing to schedule an appointment to be evaluated. Scheduled TOC with Dr. Maple Hudson 4/3 at 3pm. Nothing further needed

## 2023-09-30 NOTE — Telephone Encounter (Signed)
 Called patient and left a message for a call back.

## 2023-09-30 NOTE — Telephone Encounter (Signed)
 Received a fax from patient that she needs Dr. Allena Katz to fill out [facility] paperwork so that her license does not get revoked.  Per Dr. Allena Katz patient needs an appointment if any neurological symptoms.
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
## 2023-10-01 ENCOUNTER — Telehealth: Payer: Self-pay | Admitting: Neurology

## 2023-10-01 ENCOUNTER — Encounter: Payer: Self-pay | Admitting: Internal Medicine

## 2023-10-01 ENCOUNTER — Encounter: Payer: Self-pay | Admitting: Neurology

## 2023-10-01 ENCOUNTER — Ambulatory Visit: Admitting: Internal Medicine

## 2023-10-01 VITALS — BP 116/72 | HR 79 | Temp 97.9°F | Ht 64.0 in | Wt 227.6 lb

## 2023-10-01 DIAGNOSIS — G4733 Obstructive sleep apnea (adult) (pediatric): Secondary | ICD-10-CM

## 2023-10-01 NOTE — Telephone Encounter (Signed)
 See telephone note 10/01/23.

## 2023-10-01 NOTE — Telephone Encounter (Signed)
 I don't have anything sooner.  She could as PCP or see DMV registered provider, I believe [facility] has someone.

## 2023-10-01 NOTE — Telephone Encounter (Signed)
 Pt. Needs paper work submitted before or on 4/17, looking for sooner appt already on waitlist

## 2023-10-01 NOTE — Patient Instructions (Addendum)
 You are doing very well with CPAP. We can continue present settings and you can follow-up with Dr Clent Ridges as planned.  You are describing anxiety attacks as a reason for feeling sleepy in the day.  I suggest- ok to continue xanax for sleep at night.  Try to do without the vistaril for anxiety during the daytime, since that may make you sleepy.  There is a study we can do to measure daytime sleepiness if that becomes necessary in the future.  Consider trying an otc caffeine tablet like NoDoz if needed for alertness.  Be sure to get enough sleep at night.  It is ok to take a short nap- maybe 30-45 minutes, in the early afternoon, as long as it doesn't interfere with night time sleep.

## 2023-10-03 ENCOUNTER — Encounter: Payer: Self-pay | Admitting: Internal Medicine

## 2023-10-05 ENCOUNTER — Encounter: Payer: Self-pay | Admitting: Behavioral Health

## 2023-10-05 ENCOUNTER — Ambulatory Visit: Admitting: Behavioral Health

## 2023-10-05 ENCOUNTER — Ambulatory Visit (INDEPENDENT_AMBULATORY_CARE_PROVIDER_SITE_OTHER): Admitting: Behavioral Health

## 2023-10-05 DIAGNOSIS — R454 Irritability and anger: Secondary | ICD-10-CM | POA: Diagnosis not present

## 2023-10-05 DIAGNOSIS — F411 Generalized anxiety disorder: Secondary | ICD-10-CM

## 2023-10-05 DIAGNOSIS — F332 Major depressive disorder, recurrent severe without psychotic features: Secondary | ICD-10-CM

## 2023-10-05 DIAGNOSIS — F331 Major depressive disorder, recurrent, moderate: Secondary | ICD-10-CM

## 2023-10-05 NOTE — Progress Notes (Addendum)
 [facility] Counselor/Therapist Progress Note  Patient ID: Morgan [name], MRN: [id number],    Date: 10/05/2023  Time Spent: 3 PM until 3:32 PM, 32 minutes spent face-to-face with the patient in the outpatient therapist office.  Treatment Type: Individual Therapy  Reported Symptoms: Anxiety, depression, irritability  Mental Status Exam: Appearance:  Casual     Behavior: Appropriate  Motor: Normal  Speech/Language:  Normal Rate  Affect: Appropriate  Mood: normal  Thought process: normal  Thought content:   WNL  Sensory/Perceptual disturbances:   WNL  Orientation: oriented to person, place, time/date, situation, day of week, month of year, and year  Attention: Good  Concentration: Good  Memory: WNL  Fund of knowledge:  Good  Insight:   Good  Judgment:  Good  Impulse Control: Good   Risk Assessment: Danger to Self:  The patient has had thoughts of being overwhelmed but says she will contract for safety not hurt herself or anyone else. Self-injurious Behavior: No Danger to Others: No Duty to Warn:no Physical Aggression / Violence:No  Access to Firearms a concern: No  Gang Involvement:No   Subjective: The patient has met with her primary care physician since our last visit.  Most of the tests that he ran showed clean.  Her CPAP still shows that she is sleeping 8 hours per night.  Blood work seems to be okay.  He is looking at some other tests to run.  She said she can fall asleep pretty easily but it is always worse in the car.  She was drowsy all the way to the lady's house today where she watches and said she could barely stay awake but when she got to the ladies home she ate some breakfast which she normally does not do.  I told her that she always needed to eat when she was taking her psych meds so she is going to try that for the next few days and see if that makes a difference.  She is having to turn in a lot of documentation to the [facility] and there is  concern that she might lose her license because of this.  I encouraged her to compartmentalize those thoughts and challenge the negative thoughts until we got some better answers about why she might be falling asleep or feeling like she could when she was driving.  There is also financial stress as she is not making enough to currently pay her monthly bills and she is covered for only about another month or so.  She does know that she can go live with her sister if push comes to shove but she has applied to another agency to see if she can get some hours in additional to Morgan [name] is doing now.  I encouraged continued use of positive thoughts and coping skills for reducing anxiety.  She does contract for safety having no thoughts of hurting herself or anyone else.  Interventions: Cognitive Behavioral Therapy and Dialectical Behavioral Therapy  Diagnosis: Generalized anxiety disorder, major depressive disorder,recurrent,severe. Irritability  Plan: I will meet with the patient every 2 to 3 weeks in person.  Treatment plan: We will use cognitive behavioral therapy principles as well as elements of dialectical behavior therapy to reduce the patient's anxiety depression and irritability by at least 50% with a target goal of May 29, 2024.  Goals for her depression will be for the patient to have less sadness as indicated by patient report and PHQ-9 scores, have improved mood and return to a healthier  level of functioning as well as identify causes for depressed mood and learn ways to reduce depression and using a crisis plan if patient has active suicidal thoughts.  Interventions include using cognitive behavioral therapy to explore and replace unhealthy thought and behavior patterns contributing to depression, provide education about depression to help her understand its causes, teach and encouraged use of coping skills to reduce depression and make a crisis plan and assess ongoing level of  safety.  Goals for reducing anxiety include improving her ability to manage anxiety/stress symptoms, identify causes for anxiety and stress and explore ways to reduce it, resolve core conflicts including family history contributing to it and help her manage thoughts and worrisome thinking contributing to feelings of anxiety.  Interventions for reducing anxiety include providing education about anxiety to help around identify its causes symptoms and triggers, facilitate problem solution skills and coping skills for reducing anxiety.  We will also use cognitive behavioral therapy to identify and change anxiety provoking thought and behavior patterns as well as use dialectical behavior therapy to reduce anxiety through distress tolerance and mindfulness skills.  Goals for reducing irritability/anger include helping the patient felt an awareness of anger behaviors and alternatives to using aggressive behavior.  We will also look at learning ways to handle angry feelings in constructive ways that cannot hands daily functioning for the patient.  Interventions including using dialectical behavior therapy mindfulness skills, cognitive behavioral therapy to identify negative consequences for expressing anger and irritability and unhealthy ways.  We will also use cognitive behavioral therapy to explore and replace unhealthy thought and behavior patterns contributing to feelings of resentment and disappointment.  We will identify core values that may not being met that are contributing to feelings of anger.  We will confront and reflects the patient's behaviors that happened when both angry and out of session.  We will assist the patient in looking at how anger was modeled for her and how this positively or negatively impacted her.  We will also teach respectively assertive communication skills as well as self soothing skills to help the patient better manage anger reactions.  Cecile Coder,  [facility]                  Cecile Coder, [facility]               Cecile Coder, [facility]               Cecile Coder, [facility]                Cecile Coder, [facility]               Cecile Coder, [facility]               Cecile Coder, [facility]               Cecile Coder, [facility]

## 2023-10-07 ENCOUNTER — Encounter: Payer: Self-pay | Admitting: Neurology

## 2023-10-07 ENCOUNTER — Ambulatory Visit: Admitting: Neurology

## 2023-10-07 VITALS — BP 132/79 | HR 67 | Ht 64.0 in | Wt 229.0 lb

## 2023-10-07 DIAGNOSIS — F430 Acute stress reaction: Secondary | ICD-10-CM

## 2023-10-07 DIAGNOSIS — F410 Panic disorder [episodic paroxysmal anxiety] without agoraphobia: Secondary | ICD-10-CM | POA: Diagnosis not present

## 2023-10-07 DIAGNOSIS — R404 Transient alteration of awareness: Secondary | ICD-10-CM

## 2023-10-07 NOTE — Telephone Encounter (Signed)
 FYI Pt was seen by pulmonary on 10/01/23 Advised to Discontinue morning Vistaril.
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
## 2023-10-08 DIAGNOSIS — G4733 Obstructive sleep apnea (adult) (pediatric): Secondary | ICD-10-CM | POA: Diagnosis not present

## 2023-10-09 ENCOUNTER — Encounter: Payer: Self-pay | Admitting: Behavioral Health

## 2023-10-09 ENCOUNTER — Other Ambulatory Visit: Payer: Self-pay | Admitting: Family Medicine

## 2023-10-09 DIAGNOSIS — E785 Hyperlipidemia, unspecified: Secondary | ICD-10-CM

## 2023-10-12 ENCOUNTER — Encounter: Payer: Self-pay | Admitting: Family Medicine

## 2023-10-19 ENCOUNTER — Encounter: Payer: Self-pay | Admitting: Behavioral Health

## 2023-10-19 ENCOUNTER — Ambulatory Visit (INDEPENDENT_AMBULATORY_CARE_PROVIDER_SITE_OTHER): Admitting: Behavioral Health

## 2023-10-19 ENCOUNTER — Ambulatory Visit: Admitting: Behavioral Health

## 2023-10-19 DIAGNOSIS — F411 Generalized anxiety disorder: Secondary | ICD-10-CM | POA: Diagnosis not present

## 2023-10-19 DIAGNOSIS — F332 Major depressive disorder, recurrent severe without psychotic features: Secondary | ICD-10-CM | POA: Diagnosis not present

## 2023-10-19 DIAGNOSIS — R454 Irritability and anger: Secondary | ICD-10-CM

## 2023-10-19 DIAGNOSIS — F331 Major depressive disorder, recurrent, moderate: Secondary | ICD-10-CM

## 2023-10-19 NOTE — Progress Notes (Addendum)
  [facility] Counselor/Therapist Progress Note  Patient ID: Morgan [name], MRN: [id number],    Date: 10/19/2023  Time Spent: 3 PM until 3:50 PM, 50 minutes spent face-to-face with the patient in the outpatient therapist office.  Treatment Type: Individual Therapy  Reported Symptoms: Anxiety, depression, irritability  Mental Status Exam: Appearance:  Casual     Behavior: Appropriate  Motor: Normal  Speech/Language:  Normal Rate  Affect: Appropriate  Mood: normal  Thought process: normal  Thought content:   WNL  Sensory/Perceptual disturbances:   WNL  Orientation: oriented to person, place, time/date, situation, day of week, month of year, and year  Attention: Good  Concentration: Good  Memory: WNL  Fund of knowledge:  Good  Insight:   Good  Judgment:  Good  Impulse Control: Good   Risk Assessment: Danger to Self:  The patient has had thoughts of being overwhelmed but says she will contract for safety not hurt herself or anyone else. Self-injurious Behavior: No Danger to Others: No Duty to Warn:no Physical Aggression / Violence:No  Access to Firearms a concern: No  Gang Involvement:No   Subjective: The patient has spoken to the [place] him for the and there is and understanding they may suspend her license but she is not sure of the details.  They are mailing a consent form but she is not sure what the concerns about doctors are to wait until she got the form and speak to her sister or her doctor or myself about the form to see what it is.  Also encouraged her to reach out to her doctor to see if there had been any other findings from the test related to her becoming drowsy while driving.  She did say that she started eating something in the morning and that has gotten better.  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  We talked about  compartmentalizing keeping her thoughts in the moment.  She also has made a new friend who she is talking to daily.  He lives about an hour away but did come take her out to dinner for her birthday but she enjoys conversations with him on a regular basis.  For the most part she enjoys her 3 clients and they have been nice to her.  We again practiced some mindfulness exercises to help notice the thoughts that require immediate attention and to let go of the ones that she has no control over. I encouraged continued use of positive thoughts and coping skills for reducing anxiety.  She does contract for safety having no thoughts of hurting herself or anyone else.  Interventions: Cognitive Behavioral Therapy and Dialectical Behavioral Therapy  Diagnosis: Generalized anxiety disorder, major depressive disorder,recurrent,severe. Irritability  Plan: I will meet with the patient every 2 to 3 weeks in person.  Treatment plan: We will use cognitive behavioral therapy principles as well as elements of dialectical behavior therapy to reduce the patient's anxiety depression and irritability by at least 50% with a target goal of  May 29, 2024.  Goals for her depression will be for the patient to have less sadness as indicated by patient report and PHQ-9 scores, have improved mood and return to a healthier level of functioning as well as identify causes for depressed mood and learn ways to reduce depression and using a crisis plan if patient has active suicidal thoughts.  Interventions  include using cognitive behavioral therapy to explore and replace unhealthy thought and behavior patterns contributing to depression, provide education about depression to help her understand its causes, teach and encouraged use of coping skills to reduce depression and make a crisis plan and assess ongoing level of safety.  Goals for reducing anxiety include improving her ability to manage anxiety/stress symptoms, identify causes  for anxiety and stress and explore ways to reduce it, resolve core conflicts including family history contributing to it and help her manage thoughts and worrisome thinking contributing to feelings of anxiety.  Interventions for reducing anxiety include providing education about anxiety to help around identify its causes symptoms and triggers, facilitate problem solution skills and coping skills for reducing anxiety.  We will also use cognitive behavioral therapy to identify and change anxiety provoking thought and behavior patterns as well as use dialectical behavior therapy to reduce anxiety through distress tolerance and mindfulness skills.  Goals for reducing irritability/anger include helping the patient felt an awareness of anger behaviors and alternatives to using aggressive behavior.  We will also look at learning ways to handle angry feelings in constructive ways that cannot hands daily functioning for the patient.  Interventions including using dialectical behavior therapy mindfulness skills, cognitive behavioral therapy to identify negative consequences for expressing anger and irritability and unhealthy ways.  We will also use cognitive behavioral therapy to explore and replace unhealthy thought and behavior patterns contributing to feelings of resentment and disappointment.  We will identify core values that may not being met that are contributing to feelings of anger.  We will confront and reflects the patient's behaviors that happened when both angry and out of session.  We will assist the patient in looking at how anger was modeled for her and how this positively or negatively impacted her.  We will also teach respectively assertive communication skills as well as self soothing skills to help the patient better manage anger reactions.  Cecile Coder, [facility]                  Cecile Coder, [facility]               Cecile Coder,  [facility]               Cecile Coder, [facility]                Cecile Coder, [facility]               Cecile Coder, [facility]               Cecile Coder, [facility]               Cecile Coder, [facility]               Cecile Coder, [facility]

## 2023-10-23 ENCOUNTER — Encounter: Payer: Self-pay | Admitting: Behavioral Health

## 2023-10-27 ENCOUNTER — Ambulatory Visit: Admitting: Neurology

## 2023-10-31 ENCOUNTER — Encounter: Payer: Self-pay | Admitting: Behavioral Health

## 2023-11-02 ENCOUNTER — Ambulatory Visit: Admitting: Behavioral Health

## 2023-11-04 NOTE — Telephone Encounter (Signed)
 Noted.

## 2023-11-07 DIAGNOSIS — G4733 Obstructive sleep apnea (adult) (pediatric): Secondary | ICD-10-CM | POA: Diagnosis not present

## 2023-11-16 ENCOUNTER — Ambulatory Visit: Admitting: Behavioral Health

## 2023-11-16 ENCOUNTER — Other Ambulatory Visit: Payer: Self-pay | Admitting: Family Medicine

## 2023-11-16 ENCOUNTER — Encounter: Payer: Self-pay | Admitting: Behavioral Health

## 2023-11-16 DIAGNOSIS — R454 Irritability and anger: Secondary | ICD-10-CM | POA: Diagnosis not present

## 2023-11-16 DIAGNOSIS — F332 Major depressive disorder, recurrent severe without psychotic features: Secondary | ICD-10-CM | POA: Diagnosis not present

## 2023-11-16 DIAGNOSIS — F411 Generalized anxiety disorder: Secondary | ICD-10-CM

## 2023-11-16 NOTE — Progress Notes (Addendum)
 [facility] Counselor/Therapist Progress Note  Patient ID: Morgan [name], MRN: [id number],    Date: 11/16/2023  Time Spent: 1 PM until 2 PM, 60 minutes spent face-to-face with the patient in the outpatient therapist office.  Treatment Type: Individual Therapy  Reported Symptoms: Anxiety, depression, irritability  Mental Status Exam: Appearance:  Casual     Behavior: Appropriate  Motor: Normal  Speech/Language:  Normal Rate  Affect: Appropriate  Mood: normal  Thought process: normal  Thought content:   WNL  Sensory/Perceptual disturbances:   WNL  Orientation: oriented to person, place, time/date, situation, day of week, month of year, and year  Attention: Good  Concentration: Good  Memory: WNL  Fund of knowledge:  Good  Insight:   Good  Judgment:  Good  Impulse Control: Good   Risk Assessment: Danger to Self:  The patient has had thoughts of being overwhelmed but says she will contract for safety not hurt herself or anyone else. Self-injurious Behavior: No Danger to Others: No Duty to Warn:no Physical Aggression / Violence:No  Access to Firearms a concern: No  Gang Involvement:No   Subjective: The patient was irritated because of interaction with clients that she had to work with.  The pt. she was working with  over this past weekend and normally does not work with was fairly rude and disrespectful to her.  He knew that she was temporary for the weekend and yet he was very short with her.  She can tell that he was trying to control his anger.  She said she tried to keep herself calm but itfelt very personal to her.  We took time to look at what it was about his response that bothered her.  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  She is not making much money and so there is  significant financial stress.  Mom and sister are not very encouraging.  Her friend does still try to encourage but she can tell that they are tired of her frustrations.  She referred to her previous job but acknowledged that she had frustrations with that also.  The 1 thing that is good about that job is that it was consistent, easy to do and it paid consistently so she did not have financial stress.  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  Also gave her the number for vocational rehabilitation telling her to call and see if she could make an appointment with them.  We reviewed coping skills and I encouraged her to continue to use the coping skills but also to continue to challenge those anxious or irritable thoughts and we looked at how to reframe those.  I encouraged continued use of positive thoughts and coping skills for reducing anxiety.  She does contract for safety having no thoughts of hurting herself or anyone else.  Interventions: Cognitive Behavioral Therapy and Dialectical Behavioral Therapy  Diagnosis: Generalized anxiety disorder, major depressive disorder,recurrent,severe. Irritability  Plan: I will meet with the patient every 2 to 3 weeks in person.  Treatment plan: We will use cognitive behavioral therapy principles as well as  elements of dialectical behavior therapy to reduce the patient's anxiety depression and irritability by at least 50% with a target goal of November, 2025.  Goals for her depression will be for the patient to have less sadness as indicated by patient report and PHQ-9 scores, have improved mood and return to a healthier level of functioning as well as identify causes for depressed mood and learn ways to reduce depression and using a crisis plan if patient has active suicidal  thoughts.  Interventions include using cognitive behavioral therapy to explore and replace unhealthy thought and behavior patterns contributing to depression, provide education about depression to help her understand its causes, teach and encouraged use of coping skills to reduce depression and make a crisis plan and assess ongoing level of safety.  Goals for reducing anxiety include improving her ability to manage anxiety/stress symptoms, identify causes for anxiety and stress and explore ways to reduce it, resolve core conflicts including family history contributing to it and help her manage thoughts and worrisome thinking contributing to feelings of anxiety.  Interventions for reducing anxiety include providing education about anxiety to help around identify its causes symptoms and triggers, facilitate problem solution skills and coping skills for reducing anxiety.  We will also use cognitive behavioral therapy to identify and change anxiety provoking thought and behavior patterns as well as use dialectical behavior therapy to reduce anxiety through distress tolerance and mindfulness skills.  Goals for reducing irritability/anger include helping the patient felt an awareness of anger behaviors and alternatives to using aggressive behavior.  We will also look at learning ways to handle angry feelings in constructive ways that cannot hands daily functioning for the patient.  Interventions including using dialectical behavior therapy mindfulness skills, cognitive behavioral therapy to identify negative consequences for expressing anger and irritability and unhealthy ways.  We will also use cognitive behavioral therapy to explore and replace unhealthy thought and behavior patterns contributing to feelings of resentment and disappointment.  We will identify core values that may not being met that are contributing to feelings of anger.  We will confront and reflects the patient's behaviors that happened when both  angry and out of session.  We will assist the patient in looking at how anger was modeled for her and how this positively or negatively impacted her.  We will also teach respectively assertive communication skills as well as self soothing skills to help the patient better manage anger reactions.  Cecile Coder, [facility]                  Cecile Coder, [facility]               Cecile Coder, [facility]               Cecile Coder, [facility]                Cecile Coder, [facility]               Cecile Coder, [facility]               Cecile Coder, [facility]               Cecile Coder, [facility]               Cecile Coder, [facility]               Cecile Coder, [facility]

## 2023-11-17 NOTE — Telephone Encounter (Signed)
 Pt LOV was on 09/29/23 Last refill done by Historical provider on 07/14/23 Please advise

## 2023-11-24 ENCOUNTER — Ambulatory Visit: Admitting: Behavioral Health

## 2023-12-02 ENCOUNTER — Encounter: Payer: Self-pay | Admitting: Behavioral Health

## 2023-12-06 ENCOUNTER — Other Ambulatory Visit: Payer: Self-pay | Admitting: Family Medicine

## 2023-12-08 DIAGNOSIS — G4733 Obstructive sleep apnea (adult) (pediatric): Secondary | ICD-10-CM | POA: Diagnosis not present

## 2023-12-11 ENCOUNTER — Other Ambulatory Visit (HOSPITAL_COMMUNITY): Payer: Self-pay | Admitting: Family Medicine

## 2023-12-11 DIAGNOSIS — I10 Essential (primary) hypertension: Secondary | ICD-10-CM

## 2023-12-14 ENCOUNTER — Ambulatory Visit (INDEPENDENT_AMBULATORY_CARE_PROVIDER_SITE_OTHER): Admitting: Behavioral Health

## 2023-12-14 ENCOUNTER — Encounter: Payer: Self-pay | Admitting: Behavioral Health

## 2023-12-14 DIAGNOSIS — R454 Irritability and anger: Secondary | ICD-10-CM

## 2023-12-14 DIAGNOSIS — F332 Major depressive disorder, recurrent severe without psychotic features: Secondary | ICD-10-CM | POA: Diagnosis not present

## 2023-12-14 DIAGNOSIS — F411 Generalized anxiety disorder: Secondary | ICD-10-CM

## 2023-12-14 NOTE — Progress Notes (Addendum)
 [facility] Counselor/Therapist Progress Note  Patient ID: Morgan [name], MRN: [id number],    Date: 12/14/2023  Time Spent: 1 PM until 1:50 PM, 50 minutes spent face-to-face with the patient in the outpatient therapist office.  Treatment Type: Individual Therapy  Reported Symptoms: Anxiety, depression, irritability  Mental Status Exam: Appearance:  Casual     Behavior: Appropriate  Motor: Normal  Speech/Language:  Normal Rate  Affect: Appropriate  Mood: normal  Thought process: normal  Thought content:   WNL  Sensory/Perceptual disturbances:   WNL  Orientation: oriented to person, place, time/date, situation, day of week, month of year, and year  Attention: Good  Concentration: Good  Memory: WNL  Fund of knowledge:  Good  Insight:   Good  Judgment:  Good  Impulse Control: Good   Risk Assessment: Danger to Self:  The patient has had thoughts of being overwhelmed but says she will contract for safety not hurt herself or anyone else. Self-injurious Behavior: No Danger to Others: No Duty to Warn:no Physical Aggression / Violence:No  Access to Firearms a concern: No  Gang Involvement:No   Subjective: The patient's work has been a little bit more steady over the past few weeks.  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  She is frustrated by that patient who claims that the patient is on her phone all the time.  The patient said the only time she was on the phone is when she checks in her hours and has the patient signed off on it and otherwise she is not.  She handled it in a very respectfully assertive manner which I commended her on.  Her sister did lendsome money so there is some financial stress relief at least for the next couple of months.  The big news is says that she met with vocational rehabilitation and is optimistic.  They are going to do an assessment to see what she is capable of doing.  Agreed to fill out documentation for her and  sent it to Kelly Hoyle.  Her phone number is [phone] and her fax number is [phone].  Her email address says [email or web address]  I encouraged continued use of positive thoughts and coping skills for reducing anxiety.  She does contract for safety having no thoughts of hurting herself or anyone else.  Interventions: Cognitive Behavioral Therapy and Dialectical Behavioral Therapy  Diagnosis: Generalized anxiety disorder, major depressive disorder,recurrent,severe. Irritability  Plan: I will meet with the patient every 2 to 3 weeks in person.  Treatment plan: We will use cognitive behavioral therapy principles as well as elements of dialectical behavior therapy to reduce the patient's anxiety depression and irritability by at least 50% with a target goal of November 30 , 2025.  Goals for her depression will be for the patient to have less sadness as indicated by patient report and PHQ-9 scores, have improved mood and return to a healthier level of functioning as well as identify causes for depressed mood and learn ways to reduce depression and using a crisis plan if patient has active suicidal thoughts.  Interventions include using cognitive behavioral therapy to explore and replace unhealthy thought and behavior patterns contributing to depression, provide education about depression to help her understand its causes, teach and encouraged use of coping skills to reduce depression and make a crisis plan and assess ongoing level of safety.  Goals for reducing anxiety include improving her ability to manage anxiety/stress symptoms, identify causes for anxiety and stress  and explore ways to reduce it, resolve core conflicts including family history contributing to it and help her manage thoughts and worrisome thinking contributing to feelings of anxiety.  Interventions for reducing anxiety include providing education about anxiety to help around identify its causes symptoms and triggers,  facilitate problem solution skills and coping skills for reducing anxiety.  We will also use cognitive behavioral therapy to identify and change anxiety provoking thought and behavior patterns as well as use dialectical behavior therapy to reduce anxiety through distress tolerance and mindfulness skills.  Goals for reducing irritability/anger include helping the patient felt an awareness of anger behaviors and alternatives to using aggressive behavior.  We will also look at learning ways to handle angry feelings in constructive ways that cannot hands daily functioning for the patient.  Interventions including using dialectical behavior therapy mindfulness skills, cognitive behavioral therapy to identify negative consequences for expressing anger and irritability and unhealthy ways.  We will also use cognitive behavioral therapy to explore and replace unhealthy thought and behavior patterns contributing to feelings of resentment and disappointment.  We will identify core values that may not being met that are contributing to feelings of anger.  We will confront and reflects the patient's behaviors that happened when both angry and out of session.  We will assist the patient in looking at how anger was modeled for her and how this positively or negatively impacted her.  We will also teach respectively assertive communication skills as well as self soothing skills to help the patient better manage anger reactions.  Lorrene CHRISTELLA Hasten, [facility]                  Lorrene CHRISTELLA Hasten, [facility]               Lorrene CHRISTELLA Hasten, [facility]               Lorrene CHRISTELLA Hasten, [facility]                Lorrene CHRISTELLA Hasten, [facility]               Lorrene CHRISTELLA Hasten, [facility]               Lorrene CHRISTELLA Hasten, [facility]               Lorrene CHRISTELLA Hasten, [facility]               Lorrene CHRISTELLA Hasten, [facility]               Lorrene CHRISTELLA Hasten,  [facility]               Lorrene CHRISTELLA Hasten, [facility]

## 2023-12-18 ENCOUNTER — Encounter: Payer: Self-pay | Admitting: Internal Medicine

## 2023-12-18 ENCOUNTER — Ambulatory Visit: Admitting: Internal Medicine

## 2023-12-18 ENCOUNTER — Ambulatory Visit: Payer: Medicaid Other | Admitting: Internal Medicine

## 2023-12-18 VITALS — BP 122/80 | HR 72 | Ht 64.0 in | Wt 228.4 lb

## 2023-12-18 DIAGNOSIS — E1142 Type 2 diabetes mellitus with diabetic polyneuropathy: Secondary | ICD-10-CM | POA: Diagnosis not present

## 2023-12-18 DIAGNOSIS — Z7984 Long term (current) use of oral hypoglycemic drugs: Secondary | ICD-10-CM

## 2023-12-18 DIAGNOSIS — E1165 Type 2 diabetes mellitus with hyperglycemia: Secondary | ICD-10-CM | POA: Diagnosis not present

## 2023-12-18 LAB — POCT GLYCOSYLATED HEMOGLOBIN (HGB A1C): Hemoglobin A1C: 7.5 % — AB (ref 4.0–5.6)

## 2023-12-18 MED ORDER — GLIMEPIRIDE 2 MG PO TABS: 2.0000 mg | ORAL_TABLET | Freq: Every day | ORAL | 3 refills | Status: DC

## 2023-12-18 MED ORDER — METFORMIN HCL ER 500 MG PO TB24: 1000.0000 mg | ORAL_TABLET | Freq: Every day | ORAL | 3 refills | Status: DC

## 2023-12-18 MED ORDER — RYBELSUS 14 MG PO TABS: 14.0000 mg | ORAL_TABLET | Freq: Every day | ORAL | 3 refills | Status: DC

## 2023-12-18 NOTE — Patient Instructions (Signed)
-   Increase Glimepiride  2 mg , 1 tablet before lunch  - Continue Metformin  500 mg, 2 tablet daily - Continue Rybelsus  14 mg , 1 tablet every morning    HOW TO TREAT LOW BLOOD SUGARS (Blood sugar LESS THAN 70 MG/DL) Please follow the RULE OF 15 for the treatment of hypoglycemia treatment (when your (blood sugars are less than 70 mg/dL)   STEP 1: Take 15 grams of carbohydrates when your blood sugar is low, which includes:  3-4 GLUCOSE TABS  OR 3-4 OZ OF JUICE OR REGULAR SODA OR ONE TUBE OF GLUCOSE GEL    STEP 2: RECHECK blood sugar in 15 MINUTES STEP 3: If your blood sugar is still low at the 15 minute recheck --> then, go back to STEP 1 and treat AGAIN with another 15 grams of carbohydrates.
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
## 2023-12-21 ENCOUNTER — Encounter: Payer: Self-pay | Admitting: Family Medicine

## 2023-12-21 NOTE — Telephone Encounter (Signed)
 She can try taking 2 of these at bedtime

## 2023-12-23 ENCOUNTER — Encounter: Payer: Self-pay | Admitting: Family Medicine

## 2023-12-24 NOTE — Telephone Encounter (Signed)
 I would not worry about that number. I doubt she had been fasting for 8 hours when the sample was taken

## 2023-12-28 ENCOUNTER — Encounter: Payer: Self-pay | Admitting: Behavioral Health

## 2023-12-28 ENCOUNTER — Ambulatory Visit (INDEPENDENT_AMBULATORY_CARE_PROVIDER_SITE_OTHER): Admitting: Behavioral Health

## 2023-12-28 DIAGNOSIS — F411 Generalized anxiety disorder: Secondary | ICD-10-CM | POA: Diagnosis not present

## 2023-12-28 DIAGNOSIS — F331 Major depressive disorder, recurrent, moderate: Secondary | ICD-10-CM

## 2023-12-28 DIAGNOSIS — R454 Irritability and anger: Secondary | ICD-10-CM

## 2023-12-28 DIAGNOSIS — F332 Major depressive disorder, recurrent severe without psychotic features: Secondary | ICD-10-CM | POA: Diagnosis not present

## 2023-12-28 NOTE — Progress Notes (Signed)
 [name] Counselor/Therapist Progress Note  Patient ID: Morgan [name], MRN: [id number],    Date: 12/28/2023  Time Spent: 1 PM until 1:49 PM, 49 minutes spent face-to-face with the patient in the outpatient therapist office.  Treatment Type: Individual Therapy  Reported Symptoms: Anxiety, depression, irritability  Mental Status Exam: Appearance:  Casual     Behavior: Appropriate  Motor: Normal  Speech/Language:  Normal Rate  Affect: Appropriate  Mood: normal  Thought process: normal  Thought content:   WNL  Sensory/Perceptual disturbances:   WNL  Orientation: oriented to person, place, time/date, situation, day of week, month of year, and year  Attention: Good  Concentration: Good  Memory: WNL  Fund of knowledge:  Good  Insight:   Good  Judgment:  Good  Impulse Control: Good   Risk Assessment: Danger to Self:  The patient has had thoughts of being overwhelmed but says she will contract for safety not hurt herself or anyone else. Self-injurious Behavior: No Danger to Others: No Duty to Warn:no Physical Aggression / Violence:No  Access to Firearms a concern: No  Gang Involvement:No   Subjective: The patient has frustrated with her work.  For the most part the patient says she is caring for are fairly mild and not difficult to work with but she says at times she gets bored trying to clean and do whatever she can to help.  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  She did go to see the vocational rehab person she was not working that day.  She was reassured that have all of her paperwork are going through it and will be back in touch with her as soon as possible.  I encouraged her to be patient.  She is struggling financially because of the number of hours that she is getting but knows that she is doing all that she can with that.  She says that she is bored and feels like her life  does not have purpose.  She has started smoking because it gives her recently get out of the house and do something different.  Encouraged her to think about cutting back on the smoking and take a walk around her apartment complex every time she wanted to smoke citing the benefits of consistent exercise.  We also looked at volunteer opportunities as a way to give her a sense of purpose.  While in session we looked at volunteer opportunities within the [employer] system and look at things such as being a reading body or a lunch body and we went throughout to look up that application process on line.  She seemed to be a little excited about that possibility.  I encouraged continued use of positive thoughts and coping skills for reducing anxiety.  She does contract for safety having no thoughts of hurting herself or anyone else.  Interventions: Cognitive Behavioral Therapy and Dialectical Behavioral Therapy  Diagnosis: Generalized anxiety disorder, major depressive disorder,recurrent,severe. Irritability  Plan: I will meet with the patient every 2 to 3 weeks in person.  Treatment plan: We will use cognitive behavioral therapy principles as well as elements of dialectical behavior therapy to reduce the patient's anxiety depression and irritability by at least 50% with a target goal of November 30 , 2025.  Goals for her depression will be for the patient to have less sadness as indicated by patient report and PHQ-9 scores, have improved mood and return  to a healthier level of functioning as well as identify causes for depressed mood and learn ways to reduce depression and using a crisis plan if patient has active suicidal thoughts.  Interventions include using cognitive behavioral therapy to explore and replace unhealthy thought and behavior patterns contributing to depression, provide education about depression to help her understand its causes, teach and encouraged use of coping  skills to reduce depression and make a crisis plan and assess ongoing level of safety.  Goals for reducing anxiety include improving her ability to manage anxiety/stress symptoms, identify causes for anxiety and stress and explore ways to reduce it, resolve core conflicts including family history contributing to it and help her manage thoughts and worrisome thinking contributing to feelings of anxiety.  Interventions for reducing anxiety include providing education about anxiety to help around identify its causes symptoms and triggers, facilitate problem solution skills and coping skills for reducing anxiety.  We will also use cognitive behavioral therapy to identify and change anxiety provoking thought and behavior patterns as well as use dialectical behavior therapy to reduce anxiety through distress tolerance and mindfulness skills.  Goals for reducing irritability/anger include helping the patient felt an awareness of anger behaviors and alternatives to using aggressive behavior.  We will also look at learning ways to handle angry feelings in constructive ways that cannot hands daily functioning for the patient.  Interventions including using dialectical behavior therapy mindfulness skills, cognitive behavioral therapy to identify negative consequences for expressing anger and irritability and unhealthy ways.  We will also use cognitive behavioral therapy to explore and replace unhealthy thought and behavior patterns contributing to feelings of resentment and disappointment.  We will identify core values that may not being met that are contributing to feelings of anger.  We will confront and reflects the patient's behaviors that happened when both angry and out of session.  We will assist the patient in looking at how anger was modeled for her and how this positively or negatively impacted her.  We will also teach respectively assertive communication skills as well as self soothing skills to help the patient  better manage anger reactions.  Lorrene CHRISTELLA Hasten, [facility]                  Lorrene CHRISTELLA Hasten, [facility]               Lorrene CHRISTELLA Hasten, [facility]               Lorrene CHRISTELLA Hasten, [facility]                Lorrene CHRISTELLA Hasten, [facility]               Lorrene CHRISTELLA Hasten, [facility]               Lorrene CHRISTELLA Hasten, [facility]               Lorrene CHRISTELLA Hasten, [facility]               Lorrene CHRISTELLA Hasten, [facility]               Lorrene CHRISTELLA Hasten, [facility]               Lorrene CHRISTELLA Hasten, [facility]               Lorrene CHRISTELLA Hasten, [facility]

## 2023-12-29 ENCOUNTER — Encounter: Payer: Self-pay | Admitting: Gastroenterology

## 2023-12-31 ENCOUNTER — Ambulatory Visit: Admitting: Internal Medicine

## 2024-01-03 ENCOUNTER — Encounter: Payer: Self-pay | Admitting: Family Medicine

## 2024-01-07 DIAGNOSIS — G4733 Obstructive sleep apnea (adult) (pediatric): Secondary | ICD-10-CM | POA: Diagnosis not present

## 2024-01-11 ENCOUNTER — Encounter: Payer: Self-pay | Admitting: Behavioral Health

## 2024-01-11 ENCOUNTER — Ambulatory Visit (INDEPENDENT_AMBULATORY_CARE_PROVIDER_SITE_OTHER): Admitting: Behavioral Health

## 2024-01-11 DIAGNOSIS — F332 Major depressive disorder, recurrent severe without psychotic features: Secondary | ICD-10-CM

## 2024-01-11 DIAGNOSIS — F411 Generalized anxiety disorder: Secondary | ICD-10-CM | POA: Diagnosis not present

## 2024-01-11 DIAGNOSIS — R454 Irritability and anger: Secondary | ICD-10-CM | POA: Diagnosis not present

## 2024-01-11 NOTE — Progress Notes (Signed)
 [facility] Counselor/Therapist Progress Note  Patient ID: Morgan [name], MRN: [id number],    Date: 01/11/2024  Time Spent: 1 PM until 1:47 PM, 47 minutes spent face-to-face with the patient in the outpatient therapist office.  Treatment Type: Individual Therapy  Reported Symptoms: Anxiety, depression, irritability  Mental Status Exam: Appearance:  Casual     Behavior: Appropriate  Motor: Normal  Speech/Language:  Normal Rate  Affect: Appropriate  Mood: normal  Thought process: normal  Thought content:   WNL  Sensory/Perceptual disturbances:   WNL  Orientation: oriented to person, place, time/date, situation, day of week, month of year, and year  Attention: Good  Concentration: Good  Memory: WNL  Fund of knowledge:  Good  Insight:   Good  Judgment:  Good  Impulse Control: Good   Risk Assessment: Danger to Self:  The patient has had thoughts of being overwhelmed but says she will contract for safety not hurt herself or anyone else. 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  She acknowledges that she is projecting already in what she does not like this patient has to say something to her.  Her friends and her sister told her to focus on this being a more positive experience and we did the same in session.  We talked about why she goes to that negative automatically and talked about some different ways she could handle meeting this patient reducing her anxiety up from but also seeing it as a positive experience and how she could add to that person's life.  He keeps looking back to the full-time job that she had but recognizes that it was the stability and the consistency that appeal to her because she did  not like the work she did and has found out since then that company is not doing well.  He notes that physically she could no longer be doing that job either.  She has completed all that she can with vocational rehabilitation and is waiting to hear back from them.  I encouraged her to stay positive trusting that they do this all the time and will try to find a place that she can be a good fit vocationally.  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  I encouraged continued use of positive thoughts and coping skills for reducing anxiety.  She does contract for safety having no thoughts of hurting herself or anyone else.  Interventions: Cognitive Behavioral Therapy and Dialectical Behavioral Therapy  Diagnosis: Generalized anxiety disorder, major depressive disorder,recurrent,severe. Irritability  Plan: I will meet with the patient every 2 to 3 weeks in person.  Treatment plan: We will use cognitive behavioral therapy principles as well as elements of dialectical behavior therapy to reduce the patient's anxiety depression and irritability by at least 50% with a target goal of November 30 , 2025.  Goals for her depression will be for the patient to have less sadness as indicated by patient report and PHQ-9 scores, have improved mood and return to a healthier level of  functioning as well as identify causes for depressed mood and learn ways to reduce depression and using a crisis plan if patient has active suicidal thoughts.  Interventions include using cognitive behavioral therapy to explore and replace unhealthy thought and behavior patterns contributing to depression, provide education about depression to help her understand its causes, teach and encouraged use of coping skills to reduce  depression and make a crisis plan and assess ongoing level of safety.  Goals for reducing anxiety include improving her ability to manage anxiety/stress symptoms, identify causes for anxiety and stress and explore ways to reduce it, resolve core conflicts including family history contributing to it and help her manage thoughts and worrisome thinking contributing to feelings of anxiety.  Interventions for reducing anxiety include providing education about anxiety to help around identify its causes symptoms and triggers, facilitate problem solution skills and coping skills for reducing anxiety.  We will also use cognitive behavioral therapy to identify and change anxiety provoking thought and behavior patterns as well as use dialectical behavior therapy to reduce anxiety through distress tolerance and mindfulness skills.  Goals for reducing irritability/anger include helping the patient felt an awareness of anger behaviors and alternatives to using aggressive behavior.  We will also look at learning ways to handle angry feelings in constructive ways that cannot hands daily functioning for the patient.  Interventions including using dialectical behavior therapy mindfulness skills, cognitive behavioral therapy to identify negative consequences for expressing anger and irritability and unhealthy ways.  We will also use cognitive behavioral therapy to explore and replace unhealthy thought and behavior patterns contributing to feelings of resentment and disappointment.  We will identify core values that may not being met that are contributing to feelings of anger.  We will confront and reflects the patient's behaviors that happened when both angry and out of session.  We will assist the patient in looking at how anger was modeled for her and how this positively or negatively impacted her.  We will also teach respectively assertive communication skills as well as self soothing skills to help the patient better manage  anger reactions.  Lorrene CHRISTELLA Hasten, [facility]                  Lorrene CHRISTELLA Hasten, [facility]               Lorrene CHRISTELLA Hasten, [facility]               Lorrene CHRISTELLA Hasten, [facility]                Lorrene CHRISTELLA Hasten, [facility]               Lorrene CHRISTELLA Hasten, [facility]               Lorrene CHRISTELLA Hasten, [facility]               Lorrene CHRISTELLA Hasten, [facility]               Lorrene CHRISTELLA Hasten, [facility]               Lorrene CHRISTELLA Hasten, [facility]               Lorrene CHRISTELLA Hasten, [facility]               Lorrene CHRISTELLA Hasten, [facility]               Lorrene CHRISTELLA Hasten, [facility]

## 2024-01-23 ENCOUNTER — Encounter: Payer: Self-pay | Admitting: Internal Medicine

## 2024-02-04 ENCOUNTER — Ambulatory Visit: Admitting: Internal Medicine

## 2024-02-05 DIAGNOSIS — G4733 Obstructive sleep apnea (adult) (pediatric): Secondary | ICD-10-CM | POA: Diagnosis not present

## 2024-02-09 ENCOUNTER — Ambulatory Visit (INDEPENDENT_AMBULATORY_CARE_PROVIDER_SITE_OTHER): Admitting: Behavioral Health

## 2024-02-09 DIAGNOSIS — R454 Irritability and anger: Secondary | ICD-10-CM | POA: Diagnosis not present

## 2024-02-09 DIAGNOSIS — F332 Major depressive disorder, recurrent severe without psychotic features: Secondary | ICD-10-CM

## 2024-02-09 DIAGNOSIS — F411 Generalized anxiety disorder: Secondary | ICD-10-CM

## 2024-02-09 DIAGNOSIS — F331 Major depressive disorder, recurrent, moderate: Secondary | ICD-10-CM

## 2024-02-10 ENCOUNTER — Encounter: Payer: Self-pay | Admitting: Behavioral Health

## 2024-02-10 NOTE — Progress Notes (Addendum)
 [facility] Counselor/Therapist Progress Note  Patient ID: Morgan [name], MRN: [id number],    Date: 02/09/24  Time Spent: 4 PM until 4;54 PM, 54 minutes spent face-to-face with the patient in the outpatient therapist office.  Treatment Type: Individual Therapy  Reported Symptoms: Anxiety, depression, irritability  Mental Status Exam: Appearance:  Casual     Behavior: Appropriate  Motor: Normal  Speech/Language:  Normal Rate  Affect: Appropriate  Mood: normal  Thought process: normal  Thought content:   WNL  Sensory/Perceptual disturbances:   WNL  Orientation: oriented to person, place, time/date, situation, day of week, month of year, and year  Attention: Good  Concentration: Good  Memory: WNL  Fund of knowledge:  Good  Insight:   Good  Judgment:  Good  Impulse Control: Good   Risk Assessment: Danger to Self:  The patient has had thoughts of being overwhelmed but says she will contract for safety not hurt herself or anyone else. 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  She likes all of the patients that she has which is a nice change of pace and they all appreciate her.  Even working that many hours she is just barely breaking even financially so she knows this is not something she can continue to do financially.  He has been approved by vocational rehab and the neck step is doing some assessments and test to see what she is best qualified to do workwise.  She has enjoyed the process and feels good about the steps she is taking with [facility] rehab.  She also is checking on her disability process and has not heard anything back yet about that hearing.  Her biggest stress is still financial.  She does have a friend who she is living stay with her for about a month  until his apartment is ready and they are splitting cost and so that take some stress off of her.  We talked about some of the ways that she could reduce cost at home.  Would continue to work on anxiety reduction and the patient challenging anxious and negative thoughts as well as practicing gratitude. I encouraged continued use of positive thoughts and coping skills for reducing anxiety.  She does contract for safety having no thoughts of hurting herself or anyone else.  Interventions: Cognitive Behavioral Therapy and Dialectical Behavioral Therapy  Diagnosis: Generalized anxiety disorder, major depressive disorder,recurrent,severe. Irritability  Plan: I will meet with the patient every 2 to 3 weeks in person.  Treatment plan: We will use cognitive behavioral therapy principles as well as elements of dialectical behavior therapy to reduce the patient's anxiety depression and irritability by at least 50% with a target goal of November 30 , 2025.  Goals for her depression will be for the patient to have less sadness as indicated by patient report and PHQ-9 scores, have improved mood and return to a healthier level of functioning as well as identify causes for depressed mood and learn ways to reduce depression and using a crisis plan if patient has active suicidal thoughts.  Interventions include using cognitive behavioral therapy to explore and replace unhealthy thought and behavior patterns contributing to depression, provide education about depression to help her understand its causes, teach and encouraged use of coping skills to reduce depression and make a crisis  plan and assess ongoing level of safety.  Goals for reducing anxiety include improving her ability to manage anxiety/stress symptoms, identify causes for anxiety and stress and explore ways to reduce it, resolve core conflicts including family history contributing to it and help her manage thoughts and worrisome thinking contributing to  feelings of anxiety.  Interventions for reducing anxiety include providing education about anxiety to help around identify its causes symptoms and triggers, facilitate problem solution skills and coping skills for reducing anxiety.  We will also use cognitive behavioral therapy to identify and change anxiety provoking thought and behavior patterns as well as use dialectical behavior therapy to reduce anxiety through distress tolerance and mindfulness skills.  Goals for reducing irritability/anger include helping the patient felt an awareness of anger behaviors and alternatives to using aggressive behavior.  We will also look at learning ways to handle angry feelings in constructive ways that cannot hands daily functioning for the patient.  Interventions including using dialectical behavior therapy mindfulness skills, cognitive behavioral therapy to identify negative consequences for expressing anger and irritability and unhealthy ways.  We will also use cognitive behavioral therapy to explore and replace unhealthy thought and behavior patterns contributing to feelings of resentment and disappointment.  We will identify core values that may not being met that are contributing to feelings of anger.  We will confront and reflects the patient's behaviors that happened when both angry and out of session.  We will assist the patient in looking at how anger was modeled for her and how this positively or negatively impacted her.  We will also teach respectively assertive communication skills as well as self soothing skills to help the patient better manage anger reactions.  Lorrene CHRISTELLA Hasten, [facility]                  Lorrene CHRISTELLA Hasten, [facility]               Lorrene CHRISTELLA Hasten, [facility]               Lorrene CHRISTELLA Hasten, [facility]                Lorrene CHRISTELLA Hasten, [facility]               Lorrene CHRISTELLA Hasten, [facility]               Lorrene CHRISTELLA Hasten,  [facility]               Lorrene CHRISTELLA Hasten, [facility]               Lorrene CHRISTELLA Hasten, [facility]               Lorrene CHRISTELLA Hasten, [facility]               Lorrene CHRISTELLA Hasten, [facility]               Lorrene CHRISTELLA Hasten, [facility]               Lorrene CHRISTELLA Hasten, [facility]               Lorrene CHRISTELLA Hasten, [facility]

## 2024-02-22 ENCOUNTER — Ambulatory Visit (INDEPENDENT_AMBULATORY_CARE_PROVIDER_SITE_OTHER): Admitting: Behavioral Health

## 2024-02-22 ENCOUNTER — Encounter: Payer: Self-pay | Admitting: Behavioral Health

## 2024-02-22 DIAGNOSIS — F331 Major depressive disorder, recurrent, moderate: Secondary | ICD-10-CM

## 2024-02-22 DIAGNOSIS — R454 Irritability and anger: Secondary | ICD-10-CM

## 2024-02-22 DIAGNOSIS — F332 Major depressive disorder, recurrent severe without psychotic features: Secondary | ICD-10-CM | POA: Diagnosis not present

## 2024-02-22 DIAGNOSIS — F411 Generalized anxiety disorder: Secondary | ICD-10-CM

## 2024-02-22 NOTE — Progress Notes (Signed)
 [facility] Counselor/Therapist Progress Note  Patient ID: Morgan [name], MRN: [id number],    Date: 02/22/24  Time Spent: 3 PM until 3:56 PM, 56 minutes spent face-to-face with the patient in the outpatient therapist office.  Treatment Type: Individual Therapy  Reported Symptoms: Anxiety, depression, irritability  Mental Status Exam: Appearance:  Casual     Behavior: Appropriate  Motor: Normal  Speech/Language:  Normal Rate  Affect: Appropriate  Mood: normal  Thought process: normal  Thought content:   WNL  Sensory/Perceptual disturbances:   WNL  Orientation: oriented to person, place, time/date, situation, day of week, month of year, and year  Attention: Good  Concentration: Good  Memory: WNL  Fund of knowledge:  Good  Insight:   Good  Judgment:  Good  Impulse Control: Good   Risk Assessment: Danger to Self:  The patient has had thoughts of being overwhelmed but says she will contract for safety not hurt herself or anyone else. Self-injurious Behavior: No Danger to Others: No Duty to Warn:no Physical Aggression / Violence:No  Access to Firearms a concern: No  Gang Involvement:No   Subjective: The patient has not heard anything from vocational rehabilitation but she did reach out to them last week.  Urged her to try to be patient as there is a lot of documentation paperwork to do.  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  Even getting 40 hours it is still tough to pay the bills so she spoke to an Probation officer about the possibility of an increase in the send he would look into it and get back to her.  She did spoke to them yesterday.  We talked about staying as positive that she can as well as looking for those things to be grateful for even though she does have a lot of frustrations.  The gentleman that  she was kind enough to the room with her until he found something else has done some things which she did not appreciate speaking down to her and offending her so she has come to move out by the end of the day today.  She said he became very upset.  He has a history of being in a gang and says he talks about that sometimes.  She knows that he has 2 guns and there is some fear on the patient's part.  I encouraged her to consider speaking to law enforcement about going back to her apartment with her after work today if she did not feel safe and she said she would do that.  I encouraged continued use of positive thoughts and coping skills for reducing anxiety.  She does contract for safety having no thoughts of hurting herself or anyone else.  Interventions: Cognitive Behavioral Therapy and Dialectical Behavioral Therapy  Diagnosis: Generalized anxiety disorder, major depressive disorder,recurrent,severe. Irritability  Plan: I will meet with the patient every 2 to 3 weeks in person.  Treatment plan: We will use cognitive behavioral therapy principles as well as elements of dialectical behavior therapy to reduce the patient's anxiety depression and irritability by at least 50% with a target goal of November 30 , 2025.  Goals for her depression will be for the patient to have less sadness as indicated by patient report and PHQ-9 scores, have improved mood and return to a healthier level of functioning  as well as identify causes for depressed mood and learn ways to reduce depression and using a crisis plan if patient has active suicidal thoughts.  Interventions include using cognitive behavioral therapy to explore and replace unhealthy thought and behavior patterns contributing to depression, provide education about depression to help her understand its causes, teach and encouraged use of coping skills to reduce depression and make a crisis plan and assess ongoing level of safety.  Goals for reducing anxiety  include improving her ability to manage anxiety/stress symptoms, identify causes for anxiety and stress and explore ways to reduce it, resolve core conflicts including family history contributing to it and help her manage thoughts and worrisome thinking contributing to feelings of anxiety.  Interventions for reducing anxiety include providing education about anxiety to help around identify its causes symptoms and triggers, facilitate problem solution skills and coping skills for reducing anxiety.  We will also use cognitive behavioral therapy to identify and change anxiety provoking thought and behavior patterns as well as use dialectical behavior therapy to reduce anxiety through distress tolerance and mindfulness skills.  Goals for reducing irritability/anger include helping the patient felt an awareness of anger behaviors and alternatives to using aggressive behavior.  We will also look at learning ways to handle angry feelings in constructive ways that cannot hands daily functioning for the patient.  Interventions including using dialectical behavior therapy mindfulness skills, cognitive behavioral therapy to identify negative consequences for expressing anger and irritability and unhealthy ways.  We will also use cognitive behavioral therapy to explore and replace unhealthy thought and behavior patterns contributing to feelings of resentment and disappointment.  We will identify core values that may not being met that are contributing to feelings of anger.  We will confront and reflects the patient's behaviors that happened when both angry and out of session.  We will assist the patient in looking at how anger was modeled for her and how this positively or negatively impacted her.  We will also teach respectively assertive communication skills as well as self soothing skills to help the patient better manage anger reactions.  Lorrene CHRISTELLA Hasten, [facility]                  Lorrene CHRISTELLA Hasten,  [facility]               Lorrene CHRISTELLA Hasten, [facility]               Lorrene CHRISTELLA Hasten, [facility]                Lorrene CHRISTELLA Hasten, [facility]               Lorrene CHRISTELLA Hasten, [facility]               Lorrene CHRISTELLA Hasten, [facility]               Lorrene CHRISTELLA Hasten, [facility]               Lorrene CHRISTELLA Hasten, [facility]               Lorrene CHRISTELLA Hasten, [facility]               Lorrene CHRISTELLA Hasten, [facility]               Lorrene CHRISTELLA Hasten, [facility]               Lorrene CHRISTELLA Hasten, [facility]  Lorrene CHRISTELLA Hasten, [facility]               Lorrene CHRISTELLA Hasten, [facility]

## 2024-03-02 DIAGNOSIS — R051 Acute cough: Secondary | ICD-10-CM | POA: Diagnosis not present

## 2024-03-02 DIAGNOSIS — J069 Acute upper respiratory infection, unspecified: Secondary | ICD-10-CM | POA: Diagnosis not present

## 2024-03-10 ENCOUNTER — Encounter: Payer: Self-pay | Admitting: Primary Care

## 2024-03-10 ENCOUNTER — Ambulatory Visit: Admitting: Primary Care

## 2024-03-10 VITALS — BP 104/60 | HR 69 | Temp 97.6°F | Ht 64.0 in | Wt 222.2 lb

## 2024-03-10 DIAGNOSIS — G4733 Obstructive sleep apnea (adult) (pediatric): Secondary | ICD-10-CM

## 2024-03-10 NOTE — Patient Instructions (Addendum)
  VISIT SUMMARY: You came in today for a follow-up regarding your CPAP therapy for severe sleep apnea. You have been using CPAP therapy since 2018 and recently received a new CPAP machine in April 2025. You reported no issues with the new machine and have been using it consistently every night.  YOUR PLAN: -OBSTRUCTIVE SLEEP APNEA: Obstructive sleep apnea is a condition where your airway becomes blocked during sleep, causing breathing pauses. Your sleep apnea is well controlled with your current CPAP therapy. You have been using the CPAP machine every night with an average of 8 hours and 51 minutes of use per night. Your current pressure setting is 15 cm H2O, and your residual apnea score is 1.3 events per hour, indicating well-controlled apnea. Although you experience occasional air leaks, they do not affect your apnea score. We discussed the option of the Inspire device, a surgical procedure for sleep apnea, but given your success with CPAP, surgery is not recommended at this time. You should maintain your current CPAP settings and increase the humidification level by one notch to address your dry mouth.  INSTRUCTIONS: Please continue using your CPAP machine with the current settings. Increase the humidification level by one notch to help with your dry mouth. Schedule a follow-up appointment in one year.  Follow-up 1 year with Landry NP

## 2024-03-10 NOTE — Progress Notes (Signed)
 @Patient  ID: Morgan [name], female    DOB: 08/21/1965, 58 y.o.   MRN: [id number]  Chief Complaint  Patient presents with   Follow-up    F/u CPAP-doing well, thinks pr. Is not strong enough    Referring provider: Johnny Garnette LABOR, MD  HPI: 58 year old female, someday smoker.  Past medical history significant for hypertension, OSA on CPAP, asthma, GERD, type 2 diabetes  03/10/2024 Discussed the use of AI scribe software for clinical note transcription with the patient, who gave verbal consent to proceed.  History of Present Illness Morgan [name] is a 58 year old female with severe sleep apnea who presents for a follow-up regarding her CPAP therapy.  She was initially diagnosed with severe sleep apnea in 2018, with a sleep study revealing 112 apneic events per hour and a lowest oxygen  saturation of 55%. At that time, her weight was 251 pounds, and she has since lost about 30 pounds. She has been using CPAP therapy since 2018 and recently received a new CPAP machine in April 2025.  She reports no issues with the new CPAP machine. Her usage data from August 11 to September 9 shows that she used the machine every night, averaging 8 hours and 51 minutes of use per night. Her current pressure setting is 15 cm H2O, and her residual apnea score is 1.3 events per hour. She uses a full face mask and has recently ordered a new cushion for it.  She experiences occasional air leaks, with an average leak rate of 1 liter per minute and a 95th percentile leak rate of 44 liters per minute, but these do not bother her. No significant symptoms such as waking up gasping or choking. She does not share a room with anyone regularly, though her sister finds the CPAP machine loud. She does not notice the noise due to taking an anxiety pill at night to help with sleep.  She experiences dry mouth at night and keeps a bottle of water by her bed to address this. She does not run out of water in the  humidification chamber of her CPAP machine.   Allergies  Allergen Reactions   Lisinopril  Cough    Immunization History  Administered Date(s) Administered   Influenza Split 05/01/2011, 09/05/2013   Influenza, Seasonal, Injecte, Preservative Fre 05/04/2023   Influenza,inj,Quad PF,6+ Mos 05/27/2017, 04/20/2018   Influenza-Unspecified 07/03/2016, 05/29/2021   Moderna Sars-Covid-2 Vaccination 09/22/2019, 10/20/2019, 01/19/2021   PPD Test 11/26/2022   Zoster, Live 07/01/2023    Past Medical History:  Diagnosis Date   Anal fissure    saw Dr. Vicenta Dutch    Anxiety    on meds   Arthritis    on meds   Asthma    uses inhaler if needed   Carpal tunnel syndrome    Depression    on meds   Diabetes mellitus    on meds   GERD (gastroesophageal reflux disease)    on meds   History of ovarian cancer in adulthood    Hyperlipidemia    on meds   Hypertension    on meds   Ovarian cancer (HCC) 1986   1996   Skin cancer (melanoma) (HCC) 1997   Sleep apnea    uses CPAP    Tubular adenoma of colon 01/2014    Tobacco History: Social History   Tobacco Use  Smoking Status Every Day   Types: Cigarettes  Smokeless Tobacco Never  Tobacco Comments   Patient states she smokes about  1-2 cigarettes a day some days.  Updated 03/23/2023. am   Ready to quit: Not Answered Counseling given: Not Answered Tobacco comments: Patient states she smokes about 1-2 cigarettes a day some days.  Updated 03/23/2023. am   Outpatient Medications Prior to Visit  Medication Sig Dispense Refill   albuterol  (VENTOLIN  HFA) 108 (90 Base) MCG/ACT inhaler Inhale 2 puffs into the lungs every 6 (six) hours as needed for wheezing or shortness of breath. 8 g 6   ALPRAZolam  (XANAX ) 1 MG tablet TAKE 1 TABLET (1 MG TOTAL) BY MOUTH AT BEDTIME AS NEEDED FOR SLEEP. 90 tablet 1   atorvastatin  (LIPITOR ) 80 MG tablet TAKE 1 TABLET BY MOUTH EVERY DAY 90 tablet 1   glimepiride  (AMARYL ) 2 MG tablet Take 1 tablet (2 mg  total) by mouth daily before breakfast. (Patient taking differently: Take 1 mg by mouth daily before breakfast.) 90 tablet 3   hydrOXYzine  (VISTARIL ) 25 MG capsule Take 1 capsule (25 mg total) by mouth every 6 (six) hours as needed for anxiety. 120 capsule 2   losartan -hydrochlorothiazide  (HYZAAR) 50-12.5 MG tablet TAKE 1 TABLET BY MOUTH EVERY DAY 90 tablet 1   metFORMIN  (GLUCOPHAGE -XR) 500 MG 24 hr tablet Take 2 tablets (1,000 mg total) by mouth daily with breakfast. 180 tablet 3   metoprolol  tartrate (LOPRESSOR ) 50 MG tablet Take 1 tablet (50 mg total) by mouth 2 (two) times daily. 180 tablet 3   omeprazole  (PRILOSEC) 40 MG capsule Take 1 capsule (40 mg total) by mouth 2 (two) times daily. 60 capsule 11   Semaglutide  (RYBELSUS ) 14 MG TABS Take 1 tablet (14 mg total) by mouth daily. 90 tablet 3   venlafaxine  XR (EFFEXOR -XR) 150 MG 24 hr capsule TAKE 1 CAPSULE BY MOUTH EVERY DAY 90 capsule 1   Facility-Administered Medications Prior to Visit  Medication Dose Route Frequency Provider Last Rate Last Admin   0.9 %  sodium chloride  infusion  500 mL Intravenous Once Armbruster, Elspeth SQUIBB, MD       Review of Systems  Review of Systems  Constitutional: Negative.   HENT: Negative.    Respiratory: Negative.    Cardiovascular: Negative.      Physical Exam  BP 104/60 (BP Location: Left Arm, Patient Position: Sitting, Cuff Size: Normal)   Pulse 69   Temp 97.6 F (36.4 C) (Oral)   Ht 5' 4 (1.626 m)   Wt 222 lb 3.2 oz (100.8 kg)   LMP 03/22/1986   SpO2 99%   BMI 38.14 kg/m  Physical Exam Constitutional:      General: She is not in acute distress.    Appearance: Normal appearance. She is not ill-appearing.  HENT:     Head: Normocephalic and atraumatic.     Mouth/Throat:     Mouth: Mucous membranes are moist.     Pharynx: Oropharynx is clear.  Cardiovascular:     Rate and Rhythm: Normal rate and regular rhythm.  Pulmonary:     Effort: Pulmonary effort is normal.     Breath sounds:  Normal breath sounds.  Musculoskeletal:        General: Normal range of motion.  Skin:    General: Skin is warm and dry.  Neurological:     General: No focal deficit present.     Mental Status: She is alert and oriented to person, place, and time. Mental status is at baseline.  Psychiatric:        Mood and Affect: Mood normal.  Behavior: Behavior normal.        Thought Content: Thought content normal.        Judgment: Judgment normal.     Lab Results:  CBC    Component Value Date/Time   WBC 10.1 07/14/2023 0151   RBC 4.34 07/14/2023 0151   HGB 13.1 07/14/2023 0151   HCT 40.0 07/14/2023 0151   PLT 274 07/14/2023 0151   MCV 92.2 07/14/2023 0151   MCH 30.2 07/14/2023 0151   MCHC 32.8 07/14/2023 0151   RDW 14.5 07/14/2023 0151   LYMPHSABS 2.7 07/14/2023 0151   MONOABS 1.1 (H) 07/14/2023 0151   EOSABS 0.0 07/14/2023 0151   BASOSABS 0.0 07/14/2023 0151    BMET    Component Value Date/Time   NA 136 07/14/2023 0151   K 3.6 07/14/2023 0151   CL 102 07/14/2023 0151   CO2 23 07/14/2023 0151   GLUCOSE 184 (H) 07/14/2023 0151   BUN 17 07/14/2023 0151   CREATININE 0.72 07/14/2023 0151   CREATININE 0.83 02/22/2020 1616   CALCIUM  9.3 07/14/2023 0151   GFRNONAA >60 07/14/2023 0151   GFRAA >60 10/17/2019 1135    BNP No results found for: BNP  ProBNP No results found for: PROBNP  Imaging: No results found.   Assessment & Plan:   1. OSA on CPAP (Primary)  Assessment and Plan Assessment & Plan Obstructive sleep apnea Obstructive sleep apnea is well controlled with CPAP therapy. She has been using CPAP since 2018, with a recent machine upgrade in April 2025. Current settings show excellent compliance with 100% usage and an average of 8 hours and 51 minutes per night. The pressure is set at 15 cm H2O, and the residual apnea score is 1.3 events per hour, indicating well-controlled apnea. Occasional air leaks are present but do not affect the apnea score. She has  lost 30 pounds since the original sleep study in 2018. Discussed the option of the Inspire device, a surgical procedure for moderate to severe sleep apnea, but given her tolerance and success with CPAP, surgery is not recommended at this time. Risks of the Inspire device include infection, swelling, tenderness, and potential effects on swallowing or voice quality. She prefers to continue with CPAP therapy. - Maintain current CPAP settings at 15 cm H2O. - Increase humidification level by one notch to address dry mouth. - Schedule follow-up in one year.  \ Almarie LELON Ferrari, NP 03/10/2024

## 2024-03-11 ENCOUNTER — Telehealth: Payer: Self-pay | Admitting: Behavioral Health

## 2024-03-11 ENCOUNTER — Encounter: Payer: Self-pay | Admitting: Family Medicine

## 2024-03-11 ENCOUNTER — Encounter: Payer: Self-pay | Admitting: Behavioral Health

## 2024-03-11 ENCOUNTER — Telehealth: Payer: Self-pay

## 2024-03-11 DIAGNOSIS — E1110 Type 2 diabetes mellitus with ketoacidosis without coma: Secondary | ICD-10-CM

## 2024-03-11 NOTE — Telephone Encounter (Signed)
 The patient sent a MyChart message expressing her frustration with work situation including hours, pay etc.  The MyChart message that she sent as well as my response to her or both listed in her chart.  She indicated that she was not angry enough to hurt her boss and thought about getting a gun and shooting her boss.  The patient has a history of getting frustrated easily and verbalizing of frustration with no intent.  I sent a message listed in MyChart telling her that if she was that upset she needed to call [phone] or [phone].  I also left a voicemail message about the MyChart message she left telling her that I wanted to speak with her to make sure that she was safe and was only voicing frustration.  As of the writing of this message she has not returned my phone call and I did leave a voicemail message with my number in the time that I called.

## 2024-03-11 NOTE — Progress Notes (Signed)
 Complex Care Management Note  Care Guide Note 03/11/2024 Name: Morgan [name] MRN: [id number] DOB: 1966-01-17  Morgan [name] is a 58 y.o. year old female who sees Johnny Garnette LABOR, MD for primary care. I reached out to Morgan [name] by phone today to offer complex care management services.  Ms. Gaumond was given information about Complex Care Management services today including:   The Complex Care Management services include support from the care team which includes your Nurse Care Manager, Clinical Social Worker, or Pharmacist.  The Complex Care Management team is here to help remove barriers to the health concerns and goals most important to you. Complex Care Management services are voluntary, and the patient may decline or stop services at any time by request to their care team member.   Complex Care Management Consent Status: Patient did not agree to participate in complex care management services at this time.  Follow up plan:  Patient will follow up with PCP.   Encounter Outcome:  Patient Refused  Dreama Agent [facility], [facility] Assistant Direct Dial: [phone]  Fax: [phone]

## 2024-03-11 NOTE — Telephone Encounter (Signed)
 The patient returned my phone call.  She explained that she is working 7 days a week as many shifts that she can and still cannot keep up with her bills.  She ask her immediate manager about more payee and the manager question her about why she could not borrow money from someone else, apart gas money from someone else and what she paying her bills and could she pay them differently.  She said it was very insulting because she is working as hard as she can of doing the best that she can financially and felt that she was being spoken down to.  Recognizes that she let herself get too angry and expressed regret for the things that she said in the MyChart message that she sent to me.  She does contract for safety saying she will not hurt herself her boss or anyone else.  Talked about different ways that she can handle her frustrations and how reminded her of coping skills for frustration anxiety and irritability.  I will meet with the patient on Monday, September 15.

## 2024-03-14 ENCOUNTER — Encounter: Payer: Self-pay | Admitting: Family Medicine

## 2024-03-14 ENCOUNTER — Ambulatory Visit: Admitting: Behavioral Health

## 2024-03-14 ENCOUNTER — Encounter: Payer: Self-pay | Admitting: Behavioral Health

## 2024-03-14 DIAGNOSIS — R454 Irritability and anger: Secondary | ICD-10-CM

## 2024-03-14 DIAGNOSIS — F332 Major depressive disorder, recurrent severe without psychotic features: Secondary | ICD-10-CM | POA: Diagnosis not present

## 2024-03-14 DIAGNOSIS — F411 Generalized anxiety disorder: Secondary | ICD-10-CM | POA: Diagnosis not present

## 2024-03-14 DIAGNOSIS — F331 Major depressive disorder, recurrent, moderate: Secondary | ICD-10-CM

## 2024-03-14 MED ORDER — VENLAFAXINE HCL ER 75 MG PO CP24
75.0000 mg | ORAL_CAPSULE | Freq: Every day | ORAL | 2 refills | Status: DC
Start: 2024-03-14 — End: 2024-04-20

## 2024-03-14 NOTE — Telephone Encounter (Signed)
 I read some of Morgan [name]' notes, and I think increasing the Venlafaxine  is a good idea. Stay on the 150 mg dose, and we will add a 75 mg dose to it (for a total of 225 mg daily). Gives a report back in 2 weeks

## 2024-03-14 NOTE — Progress Notes (Signed)
 [facility] Counselor/Therapist Progress Note  Patient ID: Morgan [name], MRN: [id number],    Date: 03/14/24  Time Spent: 2 PM until 2:54 PM, 54 minutes spent face-to-face with the patient in the outpatient therapist office.  Treatment Type: Individual Therapy  Reported Symptoms: Anxiety, depression, irritability  Mental Status Exam: Appearance:  Casual     Behavior: Appropriate  Motor: Normal  Speech/Language:  Normal Rate  Affect: Appropriate  Mood: normal  Thought process: normal  Thought content:   WNL  Sensory/Perceptual disturbances:   WNL  Orientation: oriented to person, place, time/date, situation, day of week, month of year, and year  Attention: Good  Concentration: Good  Memory: WNL  Fund of knowledge:  Good  Insight:   Good  Judgment:  Good  Impulse Control: Good   Risk Assessment: Danger to Self:  The patient has had thoughts of being overwhelmed but says she will contract for safety not hurt herself or anyone else. Self-injurious Behavior: No Danger to Others: No Duty to Warn:no Physical Aggression / Violence:No  Access to Firearms a concern: No  Gang Involvement:No   Subjective: The patient apologized for getting so upset with her boss last week and for the things that she said.  She said that she had no intention of hurting anyone and would not hurt anyone but was very upset about her manager's comments.  She said that she question what she did with her spare time how she spends her money and did not appreciate how much the patient has to work just to be able to pay the bills and how she never misses work or is late and did not feel appreciated.  She felt spoken down to.  She said it made her think back to how she lost her job at her former employer and while she is having a struggle today because she cannot make as much money.  She reacted angrily as something that happened and said something she regretted and that caused her job  because she communicated a threat.  She was told later that the company was letting people go and she would have lost her job anyway plus physically she could not do it so I reminded her to stay positive and focused on what she can do now to wait and see if vocational rehab can find her job making more money.  Talked about how she could save a little bit monthly.  We also continue to work on building self-esteem skills and reducing her frustration and irritability including ways to be more assertive in her communication with her frustration.  She questioned if her mood stabilization medication is that not for any feeling that may be contributing to her irritability.  We completed a PHQ-9 where she scored a 20 and that saved into epic.  I encouraged her to reach out to her primary care physician who prescribes her medication for medication evaluation conversation.  I reminded her to tell him that there is a PHQ-9 score to look at. I encouraged continued use of positive thoughts and coping skills for reducing anxiety.  She does contract for safety having no thoughts of hurting herself or anyone else.  Interventions: Cognitive Behavioral Therapy and Dialectical Behavioral Therapy  Diagnosis: Generalized anxiety disorder, major depressive disorder,recurrent,severe. Irritability  Plan: I will meet with the patient every 2 to 3 weeks in person.  Treatment plan: We will use cognitive behavioral therapy principles as well as elements of dialectical behavior therapy to reduce the patient's  anxiety depression and irritability by at least 50% with a target goal of November 30 , 2025.  Goals for her depression will be for the patient to have less sadness as indicated by patient report and PHQ-9 scores, have improved mood and return to a healthier level of functioning as well as identify causes for depressed mood and learn ways to reduce depression and using a crisis plan if patient has active suicidal thoughts.   Interventions include using cognitive behavioral therapy to explore and replace unhealthy thought and behavior patterns contributing to depression, provide education about depression to help her understand its causes, teach and encouraged use of coping skills to reduce depression and make a crisis plan and assess ongoing level of safety.  Goals for reducing anxiety include improving her ability to manage anxiety/stress symptoms, identify causes for anxiety and stress and explore ways to reduce it, resolve core conflicts including family history contributing to it and help her manage thoughts and worrisome thinking contributing to feelings of anxiety.  Interventions for reducing anxiety include providing education about anxiety to help around identify its causes symptoms and triggers, facilitate problem solution skills and coping skills for reducing anxiety.  We will also use cognitive behavioral therapy to identify and change anxiety provoking thought and behavior patterns as well as use dialectical behavior therapy to reduce anxiety through distress tolerance and mindfulness skills.  Goals for reducing irritability/anger include helping the patient felt an awareness of anger behaviors and alternatives to using aggressive behavior.  We will also look at learning ways to handle angry feelings in constructive ways that cannot hands daily functioning for the patient.  Interventions including using dialectical behavior therapy mindfulness skills, cognitive behavioral therapy to identify negative consequences for expressing anger and irritability and unhealthy ways.  We will also use cognitive behavioral therapy to explore and replace unhealthy thought and behavior patterns contributing to feelings of resentment and disappointment.  We will identify core values that may not being met that are contributing to feelings of anger.  We will confront and reflects the patient's behaviors that happened when both angry and  out of session.  We will assist the patient in looking at how anger was modeled for her and how this positively or negatively impacted her.  We will also teach respectively assertive communication skills as well as self soothing skills to help the patient better manage anger reactions.  Lorrene CHRISTELLA Hasten, [facility]                  Lorrene CHRISTELLA Hasten, [facility]               Lorrene CHRISTELLA Hasten, [facility]               Lorrene CHRISTELLA Hasten, [facility]                Lorrene CHRISTELLA Hasten, [facility]               Lorrene CHRISTELLA Hasten, [facility]               Lorrene CHRISTELLA Hasten, [facility]               Lorrene CHRISTELLA Hasten, [facility]               Lorrene CHRISTELLA Hasten, [facility]               Lorrene CHRISTELLA Hasten, [facility]               Lorrene CHRISTELLA Hasten, [facility]  Lorrene CHRISTELLA Hasten, [facility]               Lorrene CHRISTELLA Hasten, [facility]               Lorrene CHRISTELLA Hasten, [facility]               Lorrene CHRISTELLA Hasten, [facility]               Lorrene CHRISTELLA Hasten, [facility]

## 2024-03-15 ENCOUNTER — Encounter: Payer: Self-pay | Admitting: Behavioral Health

## 2024-03-15 NOTE — Telephone Encounter (Signed)
 I sent a message this afternoon with Dr Mira reccomendation

## 2024-03-16 ENCOUNTER — Encounter: Payer: Self-pay | Admitting: Behavioral Health

## 2024-03-16 NOTE — Telephone Encounter (Signed)
 She should be taking BOTH the 75 mg and the 150 mg tablets for a daily total of 225 mg

## 2024-03-17 ENCOUNTER — Ambulatory Visit: Payer: Self-pay | Admitting: Podiatry

## 2024-03-17 DIAGNOSIS — M216X1 Other acquired deformities of right foot: Secondary | ICD-10-CM

## 2024-03-17 DIAGNOSIS — M216X2 Other acquired deformities of left foot: Secondary | ICD-10-CM | POA: Diagnosis not present

## 2024-03-17 NOTE — Progress Notes (Signed)
 Subjective:  Patient ID: Morgan [name], female    DOB: [birth year],  MRN: [id number]  Chief Complaint  Patient presents with   Foot Pain    right big toe diabetic. Very painful 8/10 pain score.    58 y.o. female presents with the above complaint.  Patient presents with bilateral flatfoot deformity with right big toe pain.  She he is a diabetic.  She has not seen anyone else prior to seeing me pain scale 7 out of 10 dull aching nature she just wanted to get it evaluated she is experiencing a lot of neuropathy pain.  She does not have any open wounds or lesion.  She currently does not wear any orthotics.   Review of Systems: Negative except as noted in the HPI. Denies N/V/F/Ch.  Past Medical History:  Diagnosis Date   Anal fissure    saw Dr. Vicenta Dutch    Anxiety    on meds   Arthritis    on meds   Asthma    uses inhaler if needed   Carpal tunnel syndrome    Depression    on meds   Diabetes mellitus    on meds   GERD (gastroesophageal reflux disease)    on meds   History of ovarian cancer in adulthood    Hyperlipidemia    on meds   Hypertension    on meds   Ovarian cancer [facility]) 1986   1996   Skin cancer (melanoma) (HCC) 1997   Sleep apnea    uses CPAP    Tubular adenoma of colon 01/2014    Current Outpatient Medications:    albuterol  (VENTOLIN  HFA) 108 (90 Base) MCG/ACT inhaler, Inhale 2 puffs into the lungs every 6 (six) hours as needed for wheezing or shortness of breath., Disp: 8 g, Rfl: 6   ALPRAZolam  (XANAX ) 1 MG tablet, TAKE 1 TABLET (1 MG TOTAL) BY MOUTH AT BEDTIME AS NEEDED FOR SLEEP., Disp: 90 tablet, Rfl: 1   atorvastatin  (LIPITOR ) 80 MG tablet, TAKE 1 TABLET BY MOUTH EVERY DAY, Disp: 90 tablet, Rfl: 1   glimepiride  (AMARYL ) 2 MG tablet, Take 1 tablet (2 mg total) by mouth daily before breakfast. (Patient taking differently: Take 1 mg by mouth daily before breakfast.), Disp: 90 tablet, Rfl: 3   hydrOXYzine  (VISTARIL ) 25 MG capsule, Take 1  capsule (25 mg total) by mouth every 6 (six) hours as needed for anxiety., Disp: 120 capsule, Rfl: 2   losartan -hydrochlorothiazide  (HYZAAR) 50-12.5 MG tablet, TAKE 1 TABLET BY MOUTH EVERY DAY, Disp: 90 tablet, Rfl: 1   metFORMIN  (GLUCOPHAGE -XR) 500 MG 24 hr tablet, Take 2 tablets (1,000 mg total) by mouth daily with breakfast., Disp: 180 tablet, Rfl: 3   metoprolol  tartrate (LOPRESSOR ) 50 MG tablet, Take 1 tablet (50 mg total) by mouth 2 (two) times daily., Disp: 180 tablet, Rfl: 3   omeprazole  (PRILOSEC) 40 MG capsule, Take 1 capsule (40 mg total) by mouth 2 (two) times daily., Disp: 60 capsule, Rfl: 11   Semaglutide  (RYBELSUS ) 14 MG TABS, Take 1 tablet (14 mg total) by mouth daily., Disp: 90 tablet, Rfl: 3   venlafaxine  XR (EFFEXOR  XR) 75 MG 24 hr capsule, Take 1 capsule (75 mg total) by mouth daily with breakfast., Disp: 30 capsule, Rfl: 2   venlafaxine  XR (EFFEXOR -XR) 150 MG 24 hr capsule, TAKE 1 CAPSULE BY MOUTH EVERY DAY, Disp: 90 capsule, Rfl: 1  Current Facility-Administered Medications:    0.9 %  sodium chloride  infusion, 500 mL, Intravenous, Once,  Leigh Elspeth SQUIBB, MD  Social History   Tobacco Use  Smoking Status Every Day   Types: Cigarettes  Smokeless Tobacco Never  Tobacco Comments   Patient states she smokes about 1-2 cigarettes a day some days.  Updated 03/23/2023. am    Allergies  Allergen Reactions   Lisinopril  Cough   Objective:  There were no vitals filed for this visit. There is no height or weight on file to calculate BMI. Constitutional Well developed. Well nourished.  Vascular Dorsalis pedis pulses palpable bilaterally. Posterior tibial pulses palpable bilaterally. Capillary refill normal to all digits.  No cyanosis or clubbing noted. Pedal hair growth normal.  Neurologic Normal speech. Oriented to person, place, and time. Epicritic sensation to light touch grossly present bilaterally.  Dermatologic Nails well groomed and normal in appearance. No  open wounds. No skin lesions.  Orthopedic: No open wounds or lesion noted.  Pes planovalgus foot structure noted with calcaneovalgus to many toe signs unable to recreate the arch with dorsiflexion of the hallux unable to perform single and double heel raise   Radiographs: None Assessment:   1. Other acquired deformities of left foot   2. Other acquired deformities of right foot    Plan:  Patient was evaluated and treated and all questions answered.  Pes planovalgus -I explained to patient the etiology of pes planovalgus and relationship with heel pain/arch pain and various treatment options were discussed.  Given patient foot structure in the setting of heel pain/arch pain I believe patient will benefit from custom-made orthotics to help control the hindfoot motion support the arch of the foot and take the stress away from arches.  Patient agrees with the plan like to proceed with orthotics -Patient was casted for orthotics    No follow-ups on file.

## 2024-03-21 ENCOUNTER — Ambulatory Visit: Admitting: Primary Care

## 2024-03-21 NOTE — Telephone Encounter (Signed)
 I think she would do well on 225 mg daily, but she can certainly stay on the 150 mg dose if she prefers

## 2024-03-23 ENCOUNTER — Encounter: Payer: Self-pay | Admitting: Behavioral Health

## 2024-03-28 ENCOUNTER — Encounter: Payer: Self-pay | Admitting: Behavioral Health

## 2024-03-28 ENCOUNTER — Ambulatory Visit (INDEPENDENT_AMBULATORY_CARE_PROVIDER_SITE_OTHER): Admitting: Behavioral Health

## 2024-03-28 ENCOUNTER — Ambulatory Visit: Admitting: Behavioral Health

## 2024-03-28 DIAGNOSIS — F331 Major depressive disorder, recurrent, moderate: Secondary | ICD-10-CM

## 2024-03-28 DIAGNOSIS — F411 Generalized anxiety disorder: Secondary | ICD-10-CM

## 2024-03-28 DIAGNOSIS — R454 Irritability and anger: Secondary | ICD-10-CM | POA: Diagnosis not present

## 2024-03-28 DIAGNOSIS — F332 Major depressive disorder, recurrent severe without psychotic features: Secondary | ICD-10-CM

## 2024-03-28 NOTE — Progress Notes (Signed)
 [facility] Counselor/Therapist Progress Note  Patient ID: Armoni Kludt, MRN: [id number],    Date: 03/28/24  Time Spent: 2 PM until 2:54 PM, 54     minutes spent face-to-face with the patient in the outpatient therapist office.  Treatment Type: Individual Therapy  Reported Symptoms: Anxiety, depression, irritability  Mental Status Exam: Appearance:  Casual     Behavior: Appropriate  Motor: Normal  Speech/Language:  Normal Rate  Affect: Appropriate  Mood: normal  Thought process: normal  Thought content:   WNL  Sensory/Perceptual disturbances:   WNL  Orientation: oriented to person, place, time/date, situation, day of week, month of year, and year  Attention: Good  Concentration: Good  Memory: WNL  Fund of knowledge:  Good  Insight:   Good  Judgment:  Good  Impulse Control: Good   Risk Assessment: Danger to Self:  The patient has had thoughts of being overwhelmed but says she will contract for safety not hurt herself or anyone else. Self-injurious Behavior: No Danger to Others: No Duty to Warn:no Physical Aggression / Violence:No  Access to Firearms a concern: No  Gang Involvement:No   Subjective: Work is frustrating for the patient.  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  She cannot get an answer as to whether they will pay her more.  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  Encouraged her to discuss that with her manager but she says her manager is not very easy to work with.  Since the session the patient left me a MyChart message saying that she had tried to address it with the manager and the manager said they had addressed these things before and they were going to discuss it again.  Encouraged her to compartmentalize.  She did speak to [facility] and they said  they would have an answer for her by October 20 so encouraged her to look forward to that as positive.  She has been reflective based on a conversation with the parents and recognizes that she has had a lot of very difficult things in her life which we did validate and process.  We talked about trying to recognize that based on her faith God has given her multiple chances and even though she does not understand why things happen that she can start to choose how to make things happen as positively as she can encouraged her not to dwell on the past but used the past as motivation for making her future better we talked about how to reframe those thoughts  I encouraged continued use of positive thoughts and coping skills for reducing anxiety.  She does contract for safety having no thoughts of hurting herself or anyone else.  Interventions: Cognitive Behavioral Therapy and Dialectical Behavioral Therapy  Diagnosis: Generalized anxiety disorder, major depressive disorder,recurrent,severe. Irritability  Plan: I will meet with the patient every 2 to 3 weeks in person.  Treatment plan: We will use cognitive behavioral therapy principles as well as elements of dialectical behavior therapy to reduce the patient's anxiety depression and irritability by at least 50% with a target goal of November 30 , 2025.  Goals for her depression will be for the patient to have less sadness as indicated by patient report and PHQ-9 scores, have improved mood and  return to a healthier level of functioning as well as identify causes for depressed mood and learn ways to reduce depression and using a crisis plan if patient has active suicidal thoughts.  Interventions include using cognitive behavioral therapy to explore and replace unhealthy thought and behavior patterns contributing to depression, provide education about depression to help her understand its causes, teach and encouraged use of coping skills to reduce depression and  make a crisis plan and assess ongoing level of safety.  Goals for reducing anxiety include improving her ability to manage anxiety/stress symptoms, identify causes for anxiety and stress and explore ways to reduce it, resolve core conflicts including family history contributing to it and help her manage thoughts and worrisome thinking contributing to feelings of anxiety.  Interventions for reducing anxiety include providing education about anxiety to help around identify its causes symptoms and triggers, facilitate problem solution skills and coping skills for reducing anxiety.  We will also use cognitive behavioral therapy to identify and change anxiety provoking thought and behavior patterns as well as use dialectical behavior therapy to reduce anxiety through distress tolerance and mindfulness skills.  Goals for reducing irritability/anger include helping the patient felt an awareness of anger behaviors and alternatives to using aggressive behavior.  We will also look at learning ways to handle angry feelings in constructive ways that cannot hands daily functioning for the patient.  Interventions including using dialectical behavior therapy mindfulness skills, cognitive behavioral therapy to identify negative consequences for expressing anger and irritability and unhealthy ways.  We will also use cognitive behavioral therapy to explore and replace unhealthy thought and behavior patterns contributing to feelings of resentment and disappointment.  We will identify core values that may not being met that are contributing to feelings of anger.  We will confront and reflects the patient's behaviors that happened when both angry and out of session.  We will assist the patient in looking at how anger was modeled for her and how this positively or negatively impacted her.  We will also teach respectively assertive communication skills as well as self soothing skills to help the patient better manage anger  reactions.  Lorrene CHRISTELLA Hasten, [facility]                  Lorrene CHRISTELLA Hasten, [facility]               Lorrene CHRISTELLA Hasten, [facility]               Lorrene CHRISTELLA Hasten, [facility]                Lorrene CHRISTELLA Hasten, [facility]               Lorrene CHRISTELLA Hasten, [facility]               Lorrene CHRISTELLA Hasten, [facility]               Lorrene CHRISTELLA Hasten, [facility]               Lorrene CHRISTELLA Hasten, [facility]               Lorrene CHRISTELLA Hasten, [facility]               Lorrene CHRISTELLA Hasten, [facility]               Lorrene CHRISTELLA Hasten, [facility]               Lorrene CHRISTELLA Hasten, [facility]  Lorrene CHRISTELLA Hasten, [facility]               Lorrene CHRISTELLA Hasten, [facility]               Lorrene CHRISTELLA Hasten, [facility]               Lorrene CHRISTELLA Hasten, [facility]

## 2024-03-30 DIAGNOSIS — G4733 Obstructive sleep apnea (adult) (pediatric): Secondary | ICD-10-CM

## 2024-03-31 NOTE — Telephone Encounter (Signed)
**Note De-identified  [place] Obfuscation** Please advise 

## 2024-03-31 NOTE — Telephone Encounter (Signed)
 I have increased humidification to level 7. If this does not help dry mouth may want to try a chin strap to keep her mouth closed. She can also try over the counter biotene mouth wash at bedtime and in the morning.I am going to lower her pressure from 15 to 14cm h20 as well which may help with airleaks and dry mouth   She is having a lot of airleaks. Is her mask fitting properly?

## 2024-04-04 ENCOUNTER — Encounter: Payer: Self-pay | Admitting: Behavioral Health

## 2024-04-04 NOTE — Telephone Encounter (Signed)
 I lowered the CPAP pressure 1cm h20 due to airleaks and dry mouth She had room as her AHI was well controlled

## 2024-04-04 NOTE — Telephone Encounter (Signed)
 FYi

## 2024-04-06 ENCOUNTER — Ambulatory Visit: Admitting: Family Medicine

## 2024-04-06 ENCOUNTER — Encounter: Payer: Self-pay | Admitting: Family Medicine

## 2024-04-06 VITALS — BP 110/68 | HR 73 | Temp 97.9°F | Wt 220.0 lb

## 2024-04-06 DIAGNOSIS — F3132 Bipolar disorder, current episode depressed, moderate: Secondary | ICD-10-CM

## 2024-04-06 NOTE — Progress Notes (Signed)
   Subjective:    Patient ID: Morgan [name], female    DOB: 02/10/1966, 58 y.o.   MRN: [id number]  HPI Here to discuss her fatigue and her stress. We have spoken before about the fact that she hates her current job, about how she makes so little money, and about how they make her work 7 days a week. She is exhausted and wants some advice. She is meeting with someone tomorrow to identify her job skills and preferences, so she can look for another job. She still meets with her therapist regularly.    Review of Systems  Constitutional:  Positive for fatigue.  Respiratory: Negative.    Cardiovascular: Negative.   Psychiatric/Behavioral:  Positive for agitation, decreased concentration and dysphoric mood. Negative for confusion, hallucinations, self-injury and suicidal ideas. The patient is nervous/anxious.        Objective:   Physical Exam Constitutional:      Appearance: Normal appearance.  Cardiovascular:     Rate and Rhythm: Normal rate and regular rhythm.     Pulses: Normal pulses.     Heart sounds: Normal heart sounds.  Pulmonary:     Effort: Pulmonary effort is normal.     Breath sounds: Normal breath sounds.  Neurological:     Mental Status: She is alert.  Psychiatric:        Behavior: Behavior normal.        Thought Content: Thought content normal.     Comments: She is anxious            Assessment & Plan:  Bipolar disorder. She clearly needs a break to get her body and mind back on track, so we will write her out of work 04-07-24 and 04-08-24. Keep medications as they are. Hopefully she can find a more suitable job soon. Garnette Olmsted, MD

## 2024-04-08 ENCOUNTER — Other Ambulatory Visit: Payer: Self-pay | Admitting: Family Medicine

## 2024-04-08 ENCOUNTER — Telehealth: Payer: Self-pay

## 2024-04-08 DIAGNOSIS — E785 Hyperlipidemia, unspecified: Secondary | ICD-10-CM

## 2024-04-08 NOTE — Telephone Encounter (Signed)
 Copied from CRM #[id number]. Topic: Clinical - Order For Equipment >> Apr 04, 2024  2:02 PM Russell PARAS wrote: Reason for CRM:   Pt is contacting clinic regarding order for tubing for her CPAP machine. An order was placed for grey and white tubing replacement, due to leaking with the old tubing. She was advised this morning by [employer] that she would not receive the tubing for 7 days.   She reports she has already gone 2 nights without use of the CPAP, and is requesting if an urgent order could be placed to speed up the process. She is struggling without the machine.  Requested call back to speak with nurse  CB# [phone]   Called and spoke with the pt. Pt states she has contacted DME about this and they are going to send supplies to her. I advised pt to call [employer] regarding concerns for supplies. Nfn

## 2024-04-11 ENCOUNTER — Encounter: Payer: Self-pay | Admitting: Behavioral Health

## 2024-04-11 ENCOUNTER — Ambulatory Visit (INDEPENDENT_AMBULATORY_CARE_PROVIDER_SITE_OTHER): Admitting: Behavioral Health

## 2024-04-11 DIAGNOSIS — F411 Generalized anxiety disorder: Secondary | ICD-10-CM

## 2024-04-11 DIAGNOSIS — F332 Major depressive disorder, recurrent severe without psychotic features: Secondary | ICD-10-CM | POA: Diagnosis not present

## 2024-04-11 DIAGNOSIS — R454 Irritability and anger: Secondary | ICD-10-CM | POA: Diagnosis not present

## 2024-04-11 NOTE — Progress Notes (Signed)
 [facility] Counselor/Therapist Progress Note  Patient ID: Morgan [name], MRN: [id number],    Date: 04/11/24  Time Spent: 2 PM until 2:58 PM, 58     minutes spent face-to-face with the patient in the outpatient therapist office.  Treatment Type: Individual Therapy  Reported Symptoms: Anxiety, depression, irritability  Mental Status Exam: Appearance:  Casual     Behavior: Appropriate  Motor: Normal  Speech/Language:  Normal Rate  Affect: Appropriate  Mood: normal  Thought process: normal  Thought content:   WNL  Sensory/Perceptual disturbances:   WNL  Orientation: oriented to person, place, time/date, situation, day of week, month of year, and year  Attention: Good  Concentration: Good  Memory: WNL  Fund of knowledge:  Good  Insight:   Good  Judgment:  Good  Impulse Control: Good   Risk Assessment: Danger to Self:  The patient has had thoughts of being overwhelmed but says she will contract for safety not hurt herself or anyone else. 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  She appreciates the time off but says it hurts financially.  Her doctor did write her out a couple days because of her frustration.  I reminded her of the coping skills that we have put in place and practice to help reduce her stress and anxiety.  She has spoken with vocational rehabilitation and I asked for her to sign a release of information form for me to be able to speak with  them.  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  Supposed to meet with them again soon.  She says she been very clear in the 2-hour assessment what she felt she could and could not do physically mentally emotionally.  I told her to be patient what the process says they are trying to work for her.  She likes the patient says she is okay with now but knows she cannot keep making this kind of money.  She also was talked with her sister moving back up that way but is not sure if she would be able to live with her sister currently or find gainful employment up there.  I encouraged her to focus on the positives such as a church that she lives in good friends and good friends family that they allow her to be a big part of. I encouraged continued use of positive thoughts and coping skills for reducing anxiety.  She does contract for safety having no thoughts of hurting herself or anyone else.  Interventions: Cognitive Behavioral Therapy and Dialectical Behavioral Therapy  Diagnosis: Generalized anxiety disorder, major depressive disorder,recurrent,severe. Irritability  Plan: I will meet with the patient every 2 to 3 weeks in person.  Treatment plan: We will use cognitive behavioral therapy principles as well as elements of dialectical behavior  therapy to reduce the patient's anxiety depression and irritability by at least 50% with a target goal of November 30 , 2025.  Goals for her depression will be for the patient to have less sadness as indicated by patient report and PHQ-9 scores, have improved mood and return to a healthier level of functioning as well as identify causes for depressed mood and learn ways to reduce depression and using a crisis plan if patient has active suicidal thoughts.  Interventions include using cognitive behavioral therapy to explore  and replace unhealthy thought and behavior patterns contributing to depression, provide education about depression to help her understand its causes, teach and encouraged use of coping skills to reduce depression and make a crisis plan and assess ongoing level of safety.  Goals for reducing anxiety include improving her ability to manage anxiety/stress symptoms, identify causes for anxiety and stress and explore ways to reduce it, resolve core conflicts including family history contributing to it and help her manage thoughts and worrisome thinking contributing to feelings of anxiety.  Interventions for reducing anxiety include providing education about anxiety to help around identify its causes symptoms and triggers, facilitate problem solution skills and coping skills for reducing anxiety.  We will also use cognitive behavioral therapy to identify and change anxiety provoking thought and behavior patterns as well as use dialectical behavior therapy to reduce anxiety through distress tolerance and mindfulness skills.  Goals for reducing irritability/anger include helping the patient felt an awareness of anger behaviors and alternatives to using aggressive behavior.  We will also look at learning ways to handle angry feelings in constructive ways that cannot hands daily functioning for the patient.  Interventions including using dialectical behavior therapy mindfulness skills, cognitive behavioral therapy to identify negative consequences for expressing anger and irritability and unhealthy ways.  We will also use cognitive behavioral therapy to explore and replace unhealthy thought and behavior patterns contributing to feelings of resentment and disappointment.  We will identify core values that may not being met that are contributing to feelings of anger.  We will confront and reflects the patient's behaviors that happened when both angry and out of session.  We will assist the patient in looking at how anger  was modeled for her and how this positively or negatively impacted her.  We will also teach respectively assertive communication skills as well as self soothing skills to help the patient better manage anger reactions.  Progress:35%  Lorrene CHRISTELLA Hasten, [facility]                  Lorrene CHRISTELLA Hasten, [facility]               Lorrene CHRISTELLA Hasten, [facility]               Lorrene CHRISTELLA Hasten, [facility]                Lorrene CHRISTELLA Hasten, [facility]               Lorrene CHRISTELLA Hasten, [facility]               Lorrene CHRISTELLA Hasten, [facility]               Lorrene CHRISTELLA Hasten, [facility]               Lorrene CHRISTELLA Hasten, [facility]               Lorrene CHRISTELLA Hasten, [facility]  Lorrene CHRISTELLA Hasten, [facility]               Lorrene CHRISTELLA Hasten, [facility]               Lorrene CHRISTELLA Hasten, [facility]               Lorrene CHRISTELLA Hasten, [facility]               Lorrene CHRISTELLA Hasten, [facility]               Lorrene CHRISTELLA Hasten, [facility]               Lorrene CHRISTELLA Hasten, [facility]               Lorrene CHRISTELLA Hasten, [facility]

## 2024-04-12 ENCOUNTER — Encounter: Payer: Self-pay | Admitting: Behavioral Health

## 2024-04-13 ENCOUNTER — Encounter: Payer: Self-pay | Admitting: Behavioral Health

## 2024-04-13 NOTE — Telephone Encounter (Signed)
**Note De-identified  [place] Obfuscation** Please advise 

## 2024-04-14 NOTE — Telephone Encounter (Signed)
 Yes she had a large amount of airleaks on Oct 13 and 14th, however, it did not effect her over all apnea control. Her events were less than 1 per hour. If having airleaks check with DME about mask or hose issues. Other options if mask isnt fitting properly is having a densensitization study in the [facility] lab for a formal mask fitting

## 2024-04-18 ENCOUNTER — Encounter: Payer: Self-pay | Admitting: Behavioral Health

## 2024-04-20 ENCOUNTER — Other Ambulatory Visit: Payer: Self-pay | Admitting: Family Medicine

## 2024-04-21 ENCOUNTER — Telehealth: Payer: Self-pay | Admitting: Pharmacy Technician

## 2024-04-21 NOTE — Telephone Encounter (Signed)
 Pharmacy Patient Advocate Encounter   Received notification from CoverMyMeds that prior authorization for Ozempic  (1 MG/DOSE) 4MG /3ML pen-injectors is due for renewal.   Insurance verification completed.   The patient is insured through [facility] MEDICAID.  Action: Medication has been discontinued. Archived Key: [id number]

## 2024-04-25 ENCOUNTER — Encounter: Payer: Self-pay | Admitting: Behavioral Health

## 2024-04-25 ENCOUNTER — Ambulatory Visit: Admitting: Behavioral Health

## 2024-04-25 DIAGNOSIS — F411 Generalized anxiety disorder: Secondary | ICD-10-CM | POA: Diagnosis not present

## 2024-04-25 DIAGNOSIS — R454 Irritability and anger: Secondary | ICD-10-CM | POA: Diagnosis not present

## 2024-04-25 DIAGNOSIS — F332 Major depressive disorder, recurrent severe without psychotic features: Secondary | ICD-10-CM | POA: Diagnosis not present

## 2024-04-25 DIAGNOSIS — F331 Major depressive disorder, recurrent, moderate: Secondary | ICD-10-CM

## 2024-04-25 NOTE — Progress Notes (Signed)
 [facility] Counselor/Therapist Progress Note  Patient ID: Morgan [name], MRN: [id number],    Date: 04/25/24  Time Spent: 2:04 PM until 2:48 PM, 44     minutes spent face-to-face with the patient in the outpatient therapist office.  Treatment Type: Individual Therapy  Reported Symptoms: Anxiety, depression, irritability  Mental Status Exam: Appearance:  Casual     Behavior: Appropriate  Motor: Normal  Speech/Language:  Normal Rate  Affect: Appropriate  Mood: normal  Thought process: normal  Thought content:   WNL  Sensory/Perceptual disturbances:   WNL  Orientation: oriented to person, place, time/date, situation, day of week, month of year, and year  Attention: Good  Concentration: Good  Memory: WNL  Fund of knowledge:  Good  Insight:   Good  Judgment:  Good  Impulse Control: Good   Risk Assessment: Danger to Self:  The patient has had thoughts of being overwhelmed but says she will contract for safety not hurt herself or anyone else. Self-injurious Behavior: No Danger to Others: No Duty to Warn:no Physical Aggression / Violence:No  Access to Firearms a concern: No  Gang Involvement:No   Subjective: The patient has spoken with vocational rehabilitation and there are 2 facilities both of which in the dietary area putting a [facility].  She submitted the applications and is waiting to hear back from vocational rehab with the next steps.  She is optimistic.  They have cut her hours which she says is better and that she does not unfortunately like nights but also has put her in a difficult place financially.  She is paying rent and does have food but said now her car will not start and she does not have much money left for the rest of the month.  She does have 2 friends who said they were all of her some help with gas money if she does so I encouraged her to find out what is wrong with her call her and see if they will help  her with that with the promised to pay them back.  There are other stressors and that her friend Roseline is father is dying and she is trying to be compassionate.  Her sister is having some health issues and that concerns are [place] so she knows all that she can do is check on her and pray.  I encouraged her to remain positive we talked about the difference between planning versus projecting in terms of the self-care and looking at her short-term future.  I encouraged continued use of positive thoughts and coping skills for reducing anxiety.  She does contract for safety having no thoughts of hurting herself or anyone else.  Interventions: Cognitive Behavioral Therapy and Dialectical Behavioral Therapy  Diagnosis: Generalized anxiety disorder, major depressive disorder,recurrent,severe. Irritability  Plan: I will meet with the patient every 2 to 3 weeks in person.  Treatment plan: We will use cognitive behavioral therapy principles as well as elements of dialectical behavior therapy to reduce the patient's anxiety depression and irritability by at least 50% with a target goal of November 30 , 2025.  Goals for her depression will be for the patient to have less sadness as indicated by patient report and PHQ-9 scores, have improved mood and return to a healthier level of functioning as well as identify causes for depressed mood and learn ways to reduce depression and using a crisis plan if patient  has active suicidal thoughts.  Interventions include using cognitive behavioral therapy to explore and replace unhealthy thought and behavior patterns contributing to depression, provide education about depression to help her understand its causes, teach and encouraged use of coping skills to reduce depression and make a crisis plan and assess ongoing level of safety.  Goals for reducing anxiety include improving her ability to manage anxiety/stress  symptoms, identify causes for anxiety and stress and explore ways to reduce it, resolve core conflicts including family history contributing to it and help her manage thoughts and worrisome thinking contributing to feelings of anxiety.  Interventions for reducing anxiety include providing education about anxiety to help around identify its causes symptoms and triggers, facilitate problem solution skills and coping skills for reducing anxiety.  We will also use cognitive behavioral therapy to identify and change anxiety provoking thought and behavior patterns as well as use dialectical behavior therapy to reduce anxiety through distress tolerance and mindfulness skills.  Goals for reducing irritability/anger include helping the patient felt an awareness of anger behaviors and alternatives to using aggressive behavior.  We will also look at learning ways to handle angry feelings in constructive ways that cannot hands daily functioning for the patient.  Interventions including using dialectical behavior therapy mindfulness skills, cognitive behavioral therapy to identify negative consequences for expressing anger and irritability and unhealthy ways.  We will also use cognitive behavioral therapy to explore and replace unhealthy thought and behavior patterns contributing to feelings of resentment and disappointment.  We will identify core values that may not being met that are contributing to feelings of anger.  We will confront and reflects the patient's behaviors that happened when both angry and out of session.  We will assist the patient in looking at how anger was modeled for her and how this positively or negatively impacted her.  We will also teach respectively assertive communication skills as well as self soothing skills to help the patient better manage anger reactions.  Progress:35%  Lorrene CHRISTELLA Hasten, [facility]                  Lorrene CHRISTELLA Hasten, [facility]               Lorrene CHRISTELLA Hasten, [facility]               Lorrene CHRISTELLA Hasten, [facility]                Lorrene CHRISTELLA Hasten, [facility]               Lorrene CHRISTELLA Hasten, [facility]               Lorrene CHRISTELLA Hasten, [facility]               Lorrene CHRISTELLA Hasten, [facility]               Lorrene CHRISTELLA Hasten, [facility]               Lorrene CHRISTELLA Hasten, [facility]               Lorrene CHRISTELLA Hasten, [facility]               Lorrene CHRISTELLA Hasten, [facility]               Lorrene CHRISTELLA Hasten, [facility]               Lorrene CHRISTELLA Hasten, [facility]               Lorrene  CHRISTELLA Hasten, [facility]               Lorrene CHRISTELLA Hasten, [facility]               Lorrene CHRISTELLA Hasten, [facility]               Lorrene CHRISTELLA Hasten, [facility]               Lorrene CHRISTELLA Hasten, [facility]

## 2024-04-28 ENCOUNTER — Other Ambulatory Visit

## 2024-05-01 ENCOUNTER — Encounter: Payer: Self-pay | Admitting: Behavioral Health

## 2024-05-02 ENCOUNTER — Encounter: Payer: Self-pay | Admitting: Radiology

## 2024-05-09 ENCOUNTER — Ambulatory Visit (INDEPENDENT_AMBULATORY_CARE_PROVIDER_SITE_OTHER): Admitting: Behavioral Health

## 2024-05-09 ENCOUNTER — Encounter: Payer: Self-pay | Admitting: Behavioral Health

## 2024-05-09 DIAGNOSIS — F332 Major depressive disorder, recurrent severe without psychotic features: Secondary | ICD-10-CM | POA: Diagnosis not present

## 2024-05-09 DIAGNOSIS — R454 Irritability and anger: Secondary | ICD-10-CM | POA: Diagnosis not present

## 2024-05-09 DIAGNOSIS — F331 Major depressive disorder, recurrent, moderate: Secondary | ICD-10-CM

## 2024-05-09 DIAGNOSIS — F411 Generalized anxiety disorder: Secondary | ICD-10-CM

## 2024-05-09 NOTE — Progress Notes (Signed)
 [facility] Counselor/Therapist Progress Note  Patient ID: Morgan [name], MRN: [id number],    Date:05/09/24  Time Spent: 2:00 PM until 2:48 PM, 48 minutes spent face-to-face with the patient in the outpatient therapist office.  Treatment Type: Individual Therapy  Reported Symptoms: Anxiety, depression, irritability  Mental Status Exam: Appearance:  Casual     Behavior: Appropriate  Motor: Normal  Speech/Language:  Normal Rate  Affect: Appropriate  Mood: normal  Thought process: normal  Thought content:   WNL  Sensory/Perceptual disturbances:   WNL  Orientation: oriented to person, place, time/date, situation, day of week, month of year, and year  Attention: Good  Concentration: Good  Memory: WNL  Fund of knowledge:  Good  Insight:   Good  Judgment:  Good  Impulse Control: Good   Risk Assessment: Danger to Self:  The patient has had thoughts of being overwhelmed but says she will contract for safety not hurt herself or anyone else. Self-injurious Behavior: No Danger to Others: No Duty to Warn:no Physical Aggression / Violence:No  Access to Firearms a concern: No  Gang Involvement:No   Subjective:  The patient continues to be frustrated with her work environment.  With that company for about 8 months they just told her last week that she had been doing some of the documentation wrong.  That frustrated her but we talked about learning from this experience and moving forward going at the right away as long as she was in that position.  Patient who she stays with the most has cut her back several times was becoming more difficult financially.  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  That is good to her but she is waiting to hear back from that application.  Her friend interns also told her about the gentleman who is moving to the area who might  need some care and companionship but she has some more questions ask about that before seeing if she would be interested.  She also is concerned about her mom as she can see that her mom is changing and her sister is telling her mom is progressing fairly quickly with her dementia/Alzheimer's.  Her sister's health is not doing great either so she is concerned about her sister.  She is not able to financially right now but is hopeful to be able to spend at least a long weekend up there visiting her sister and her mom.  Her friend Loretta's father's funeral did go well and she felt like she supported her friend very well.  She is thankful to have the friendship but says she thinks a lot about being lonely so we talked about different ways that she could get out to meet other people. I encouraged continued use of positive thoughts and coping skills for reducing anxiety.  She does contract for safety having no thoughts of hurting herself or anyone else.  Interventions: Cognitive Behavioral Therapy and Dialectical Behavioral Therapy  Diagnosis: Generalized anxiety disorder, major depressive disorder,recurrent,severe. Irritability  Plan: I will meet with the patient every 2 to 3 weeks in person.  Treatment plan: We will use cognitive behavioral therapy principles as well as elements of dialectical behavior therapy to reduce the patient's anxiety depression and irritability by at least 50% with a target goal of November 30 , 2025.  Goals for her depression will be for the patient to have less sadness as indicated  by patient report and PHQ-9 scores, have improved mood and return to a healthier level of functioning as well as identify causes for depressed mood and learn ways to reduce depression and using a crisis plan if patient has active suicidal thoughts.  Interventions include using cognitive behavioral therapy to explore and replace unhealthy thought and behavior patterns contributing to depression, provide  education about depression to help her understand its causes, teach and encouraged use of coping skills to reduce depression and make a crisis plan and assess ongoing level of safety.  Goals for reducing anxiety include improving her ability to manage anxiety/stress symptoms, identify causes for anxiety and stress and explore ways to reduce it, resolve core conflicts including family history contributing to it and help her manage thoughts and worrisome thinking contributing to feelings of anxiety.  Interventions for reducing anxiety include providing education about anxiety to help around identify its causes symptoms and triggers, facilitate problem solution skills and coping skills for reducing anxiety.  We will also use cognitive behavioral therapy to identify and change anxiety provoking thought and behavior patterns as well as use dialectical behavior therapy to reduce anxiety through distress tolerance and mindfulness skills.  Goals for reducing irritability/anger include helping the patient felt an awareness of anger behaviors and alternatives to using aggressive behavior.  We will also look at learning ways to handle angry feelings in constructive ways that cannot hands daily functioning for the patient.  Interventions including using dialectical behavior therapy mindfulness skills, cognitive behavioral therapy to identify negative consequences for expressing anger and irritability and unhealthy ways.  We will also use cognitive behavioral therapy to explore and replace unhealthy thought and behavior patterns contributing to feelings of resentment and disappointment.  We will identify core values that may not being met that are contributing to feelings of anger.  We will confront and reflects the patient's behaviors that happened when both angry and out of session.  We will assist the patient in looking at how anger was modeled for her and how this positively or negatively impacted her.  We will also  teach respectively assertive communication skills as well as self soothing skills to help the patient better manage anger reactions.  Progress:35%  Lorrene CHRISTELLA Hasten, [facility]                  Lorrene CHRISTELLA Hasten, [facility]               Lorrene CHRISTELLA Hasten, [facility]               Lorrene CHRISTELLA Hasten, [place]                Lorrene CHRISTELLA Hasten, [facility]               Lorrene CHRISTELLA Hasten, [facility]               Lorrene CHRISTELLA Hasten, [facility]               Lorrene CHRISTELLA Hasten, [facility]               Lorrene CHRISTELLA Hasten, [facility]               Lorrene CHRISTELLA Hasten, [facility]               Lorrene CHRISTELLA Hasten, [facility]               Lorrene CHRISTELLA Hasten, [facility]  Lorrene CHRISTELLA Hasten, [facility]               Lorrene CHRISTELLA Hasten, [facility]               Lorrene CHRISTELLA Hasten, [facility]               Lorrene CHRISTELLA Hasten, [facility]               Lorrene CHRISTELLA Hasten, [facility]               Lorrene CHRISTELLA Hasten, [facility]               Lorrene CHRISTELLA Hasten, [facility]               Lorrene CHRISTELLA Hasten, [facility]

## 2024-05-11 ENCOUNTER — Encounter: Payer: Self-pay | Admitting: Behavioral Health

## 2024-05-15 ENCOUNTER — Encounter: Payer: Self-pay | Admitting: Behavioral Health

## 2024-05-15 ENCOUNTER — Encounter: Payer: Self-pay | Admitting: Family Medicine

## 2024-05-16 ENCOUNTER — Other Ambulatory Visit: Payer: Self-pay | Admitting: Family Medicine

## 2024-05-16 ENCOUNTER — Encounter: Payer: Self-pay | Admitting: Behavioral Health

## 2024-05-16 NOTE — Telephone Encounter (Signed)
 I think what her friend meant was ADHD not OCD. Actually Morgan [name] does show a lot of ADHD characteristics. I think she could try a non-stimulant attention medication like Strattera  if she wants to. This would not interfere with her other medications

## 2024-05-17 ENCOUNTER — Encounter: Payer: Self-pay | Admitting: Family Medicine

## 2024-05-17 ENCOUNTER — Other Ambulatory Visit: Payer: Self-pay | Admitting: Family Medicine

## 2024-05-17 NOTE — Telephone Encounter (Signed)
 Py request sent to PCP for approval

## 2024-05-17 NOTE — Telephone Encounter (Signed)
 Copied from CRM [id number]. Topic: Clinical - Medication Refill >> May 17, 2024  1:10 PM Brittany M wrote: Medication: ALPRAZolam  (XANAX ) 1 MG tablet  Has the patient contacted their pharmacy? Yes (Agent: If no, request that the patient contact the pharmacy for the refill. If patient does not wish to contact the pharmacy document the reason why and proceed with request.) (Agent: If yes, when and what did the pharmacy advise?)  This is the patient's preferred pharmacy:  [phone] - [place], [place] - [address] [place] [address] Phone: [phone] Fax: [phone]   Is this the correct pharmacy for this prescription? Yes If no, delete pharmacy and type the correct one.   Has the prescription been filled recently? Yes  Is the patient out of the medication? Yes  Has the patient been seen for an appointment in the last year OR does the patient have an upcoming appointment? Yes  Can we respond through MyChart? Yes  Agent: Please be advised that Rx refills may take up to 3 business days. We ask that you follow-up with your pharmacy.

## 2024-05-18 MED ORDER — ALPRAZOLAM 1 MG PO TABS: 1.0000 mg | ORAL_TABLET | Freq: Every evening | ORAL | 1 refills | Status: AC | PRN

## 2024-05-18 NOTE — Telephone Encounter (Signed)
 Done

## 2024-05-23 ENCOUNTER — Encounter: Payer: Self-pay | Admitting: Behavioral Health

## 2024-05-23 ENCOUNTER — Ambulatory Visit: Admitting: Behavioral Health

## 2024-05-23 DIAGNOSIS — F411 Generalized anxiety disorder: Secondary | ICD-10-CM

## 2024-05-23 DIAGNOSIS — R454 Irritability and anger: Secondary | ICD-10-CM | POA: Diagnosis not present

## 2024-05-23 DIAGNOSIS — F331 Major depressive disorder, recurrent, moderate: Secondary | ICD-10-CM | POA: Diagnosis not present

## 2024-05-23 NOTE — Progress Notes (Signed)
 [facility] Counselor/Therapist Progress Note  Patient ID: Morgan [name], MRN: [id number],    Date:05/23/24  Time Spent: 2:00 PM until 2:49 PM, 49 minutes spent face-to-face with the patient in the outpatient therapist office.  Treatment Type: Individual Therapy  Reported Symptoms: Anxiety, depression, irritability  Mental Status Exam: Appearance:  Casual     Behavior: Appropriate  Motor: Normal  Speech/Language:  Normal Rate  Affect: Appropriate  Mood: normal  Thought process: normal  Thought content:   WNL  Sensory/Perceptual disturbances:   WNL  Orientation: oriented to person, place, time/date, situation, day of week, month of year, and year  Attention: Good  Concentration: Good  Memory: WNL  Fund of knowledge:  Good  Insight:   Good  Judgment:  Good  Impulse Control: Good   Risk Assessment: Danger to Self:  The patient has had thoughts of being overwhelmed but says she will contract for safety not hurt herself or anyone else. Self-injurious Behavior: No Danger to Others: No Duty to Warn:no Physical Aggression / Violence:No  Access to Firearms a concern: No  Gang Involvement:No   Subjective: The patient received some good news.  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  She has spoken with that agency and they agreed to hire her and pay her $30.50 per hour more than she is making currently.  She will not start until at least December 1 because she needs some downtime and they have to wait for some paperwork to be transferred over for her patient.  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  She feels like this is answered prior.  We continue working on the patient being mindful.  We talked about it as noticing what she is thinking.  The patient  acknowledges that her first reaction is almost always emotional as opposed to a cognitive response so we started working toward finding the balance and worked on a basic mindfulness exercise for her to notice her thoughts.  We pointed out that noticing her thoughts helped her make a more balanced decision as opposed to a strong emotional reaction.  I encouraged continued use of positive thoughts and coping skills for reducing anxiety.  She does contract for safety having no thoughts of hurting herself or anyone else.  Interventions: Cognitive Behavioral Therapy and Dialectical Behavioral Therapy  Diagnosis: Generalized anxiety disorder, major depressive disorder,recurrent,severe. Irritability  Plan: I will meet with the patient every 2 to 3 weeks in person.  Treatment plan: We will use cognitive behavioral therapy principles as well as elements of dialectical behavior therapy to reduce the patient's anxiety depression and irritability by at least 50% with a target goal of November 30 , 2025.  Goals for her depression will be for the patient to have less sadness as indicated by patient report and PHQ-9 scores, have improved mood and return to a healthier level of functioning as well as identify causes for depressed mood and learn ways to reduce depression and using a crisis plan if patient has active suicidal thoughts.  Interventions include using cognitive behavioral therapy to explore and replace unhealthy thought and behavior patterns contributing to depression, provide education about depression to help her understand its causes, teach and encouraged use of coping skills to reduce depression and make a crisis  plan and assess ongoing level of safety.  Goals for reducing anxiety include improving her ability to manage anxiety/stress symptoms, identify causes for anxiety and stress and explore ways to reduce it, resolve core conflicts including family history contributing to it and help her manage  thoughts and worrisome thinking contributing to feelings of anxiety.  Interventions for reducing anxiety include providing education about anxiety to help around identify its causes symptoms and triggers, facilitate problem solution skills and coping skills for reducing anxiety.  We will also use cognitive behavioral therapy to identify and change anxiety provoking thought and behavior patterns as well as use dialectical behavior therapy to reduce anxiety through distress tolerance and mindfulness skills.  Goals for reducing irritability/anger include helping the patient felt an awareness of anger behaviors and alternatives to using aggressive behavior.  We will also look at learning ways to handle angry feelings in constructive ways that cannot hands daily functioning for the patient.  Interventions including using dialectical behavior therapy mindfulness skills, cognitive behavioral therapy to identify negative consequences for expressing anger and irritability and unhealthy ways.  We will also use cognitive behavioral therapy to explore and replace unhealthy thought and behavior patterns contributing to feelings of resentment and disappointment.  We will identify core values that may not being met that are contributing to feelings of anger.  We will confront and reflects the patient's behaviors that happened when both angry and out of session.  We will assist the patient in looking at how anger was modeled for her and how this positively or negatively impacted her.  We will also teach respectively assertive communication skills as well as self soothing skills to help the patient better manage anger reactions.  Progress:35%  Lorrene CHRISTELLA Hasten, [facility]                  Lorrene CHRISTELLA Hasten, [facility]               Lorrene CHRISTELLA Hasten, [facility]               Lorrene CHRISTELLA Hasten, [facility]                Lorrene CHRISTELLA Hasten, [facility]               Lorrene CHRISTELLA Hasten,  [facility]               Lorrene CHRISTELLA Hasten, [facility]               Lorrene CHRISTELLA Hasten, [facility]               Lorrene CHRISTELLA Hasten, [facility]               Lorrene CHRISTELLA Hasten, [facility]               Lorrene CHRISTELLA Hasten, [facility]               Lorrene CHRISTELLA Hasten, [facility]               Lorrene CHRISTELLA Hasten, [facility]               Lorrene CHRISTELLA Hasten, [facility]               Lorrene CHRISTELLA Hasten, [facility]               Lorrene CHRISTELLA Hasten, [facility]               Lorrene CHRISTELLA Hasten, [facility]  Lorrene CHRISTELLA Hasten, [facility]               Lorrene CHRISTELLA Hasten, [facility]               Lorrene CHRISTELLA Hasten, [facility]               Lorrene CHRISTELLA Hasten, [facility]

## 2024-05-28 ENCOUNTER — Encounter: Payer: Self-pay | Admitting: Family Medicine

## 2024-05-30 ENCOUNTER — Encounter: Payer: Self-pay | Admitting: Behavioral Health

## 2024-05-31 ENCOUNTER — Ambulatory Visit: Admitting: Family Medicine

## 2024-06-01 ENCOUNTER — Ambulatory Visit: Admitting: Family Medicine

## 2024-06-01 ENCOUNTER — Encounter: Payer: Self-pay | Admitting: Family Medicine

## 2024-06-01 VITALS — BP 118/74 | HR 71 | Temp 98.1°F | Wt 225.0 lb

## 2024-06-01 DIAGNOSIS — N761 Subacute and chronic vaginitis: Secondary | ICD-10-CM | POA: Diagnosis not present

## 2024-06-01 MED ORDER — FLUCONAZOLE 150 MG PO TABS: 150.0000 mg | ORAL_TABLET | Freq: Every day | ORAL | 0 refills | Status: AC

## 2024-06-01 NOTE — Progress Notes (Signed)
   Subjective:    Patient ID: Morgan [name], female    DOB: 1965/08/03, 58 y.o.   MRN: [id number]  HPI Here for intermittent vaginal itching that began 2 months ago. There is no pain, no DC. She has not treated this with anything yet.    Review of Systems  Constitutional: Negative.   Respiratory: Negative.    Cardiovascular: Negative.   Genitourinary:  Negative for genital sores, vaginal bleeding, vaginal discharge and vaginal pain.       Objective:   Physical Exam Constitutional:      Appearance: Normal appearance.  Cardiovascular:     Rate and Rhythm: Normal rate and regular rhythm.     Pulses: Normal pulses.     Heart sounds: Normal heart sounds.  Pulmonary:     Effort: Pulmonary effort is normal.     Breath sounds: Normal breath sounds.  Neurological:     Mental Status: She is alert.           Assessment & Plan:  Yeast vaginitis, we will treat with 5 days of Diflucan . She has also made an appt in January to see her GYN.  Garnette Olmsted, MD

## 2024-06-07 ENCOUNTER — Ambulatory Visit: Admitting: Behavioral Health

## 2024-06-07 ENCOUNTER — Encounter: Payer: Self-pay | Admitting: Behavioral Health

## 2024-06-07 DIAGNOSIS — F411 Generalized anxiety disorder: Secondary | ICD-10-CM

## 2024-06-07 DIAGNOSIS — R454 Irritability and anger: Secondary | ICD-10-CM

## 2024-06-07 NOTE — Progress Notes (Signed)
 [facility] Counselor/Therapist Progress Note  Patient ID: Morgan [name], MRN: [id number],    Date:06/07/24  Time Spent: 1 PM until 1:58 PM, 58 minutes spent face-to-face with the patient in the outpatient therapist office.  Treatment Type: Individual Therapy  Reported Symptoms: Anxiety, depression, irritability  Mental Status Exam: Appearance:  Casual     Behavior: Appropriate  Motor: Normal  Speech/Language:  Normal Rate  Affect: Appropriate  Mood: normal  Thought process: normal  Thought content:   WNL  Sensory/Perceptual disturbances:   WNL  Orientation: oriented to person, place, time/date, situation, day of week, month of year, and year  Attention: Good  Concentration: Good  Memory: WNL  Fund of knowledge:  Good  Insight:   Good  Judgment:  Good  Impulse Control: Good   Risk Assessment: Danger to Self:  The patient has had thoughts of being overwhelmed but says she will contract for safety not hurt herself or anyone else. 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  Does not know if he will bring about any change but she has already turned in her notice and started working for the other agency.  She is making more money but not making as much because she has less hours.  There is a slight difference and we looked at her expenses versus what she is bringing in every month and she is still coming out ahead working less hours.  We stressed the positive of not being tired all the time and having to work 7 days a week.  We looked at some ways that she might be able to cut some of her expenses including her cable bill and encouraged her to reframe some of the negative thoughts based on how  much she has made in the past and how hard she has had to work. I encouraged continued use of positive thoughts and coping skills for reducing anxiety.  She does contract for safety having no thoughts of hurting herself or anyone else.  Interventions: Cognitive Behavioral Therapy and Dialectical Behavioral Therapy  Diagnosis: Generalized anxiety disorder, major depressive disorder,recurrent,severe. Irritability  Plan: I will meet with the patient every 2 to 3 weeks in person.  Treatment plan: We will use cognitive behavioral therapy principles as well as elements of dialectical behavior therapy to reduce the patient's anxiety depression and irritability by at least 50% with a target goal of November 30 , 2025.  Goals for her depression will be for the patient to have less sadness as indicated by patient report and PHQ-9 scores, have improved mood and return to a healthier level of functioning as well as identify causes for depressed mood and learn ways to reduce depression and using a crisis plan if patient has active suicidal thoughts.  Interventions include using cognitive behavioral therapy to explore and replace unhealthy thought and behavior patterns contributing to depression, provide education about depression to help her understand its causes, teach and encouraged use of coping skills to reduce depression and make a crisis plan and assess ongoing level of safety.  Goals for reducing anxiety include improving her ability to manage anxiety/stress symptoms, identify causes for anxiety and stress and explore ways to reduce it, resolve core conflicts including family history contributing  to it and help her manage thoughts and worrisome thinking contributing to feelings of anxiety.  Interventions for reducing anxiety include providing education about anxiety to help around identify its causes symptoms and triggers, facilitate problem solution skills and coping skills for reducing anxiety.  We will  also use cognitive behavioral therapy to identify and change anxiety provoking thought and behavior patterns as well as use dialectical behavior therapy to reduce anxiety through distress tolerance and mindfulness skills.  Goals for reducing irritability/anger include helping the patient felt an awareness of anger behaviors and alternatives to using aggressive behavior.  We will also look at learning ways to handle angry feelings in constructive ways that cannot hands daily functioning for the patient.  Interventions including using dialectical behavior therapy mindfulness skills, cognitive behavioral therapy to identify negative consequences for expressing anger and irritability and unhealthy ways.  We will also use cognitive behavioral therapy to explore and replace unhealthy thought and behavior patterns contributing to feelings of resentment and disappointment.  We will identify core values that may not being met that are contributing to feelings of anger.  We will confront and reflects the patient's behaviors that happened when both angry and out of session.  We will assist the patient in looking at how anger was modeled for her and how this positively or negatively impacted her.  We will also teach respectively assertive communication skills as well as self soothing skills to help the patient better manage anger reactions.  Progress:35%  Lorrene CHRISTELLA Hasten, [facility]                  Lorrene CHRISTELLA Hasten, [facility]               Lorrene CHRISTELLA Hasten, [facility]               Lorrene CHRISTELLA Hasten, [facility]                Lorrene CHRISTELLA Hasten, [facility]               Lorrene CHRISTELLA Hasten, [facility]               Lorrene CHRISTELLA Hasten, [facility]               Lorrene CHRISTELLA Hasten, [facility]               Lorrene CHRISTELLA Hasten, [facility]               Lorrene CHRISTELLA Hasten, [facility]               Lorrene CHRISTELLA Hasten, [facility]               Lorrene CHRISTELLA Hasten, [facility]               Lorrene CHRISTELLA Hasten, [facility]               Lorrene CHRISTELLA Hasten, [facility]               Lorrene CHRISTELLA Hasten, [facility]               Lorrene CHRISTELLA Hasten, [facility]               Lorrene CHRISTELLA Hasten, [facility]               Lorrene CHRISTELLA Hasten, [facility]               Lorrene CHRISTELLA Hasten, [facility]  Lorrene CHRISTELLA Hasten, [facility]               Lorrene CHRISTELLA Hasten, [facility]               Lorrene CHRISTELLA Hasten, [facility]

## 2024-06-09 ENCOUNTER — Other Ambulatory Visit (HOSPITAL_COMMUNITY): Payer: Self-pay | Admitting: Family Medicine

## 2024-06-09 DIAGNOSIS — I10 Essential (primary) hypertension: Secondary | ICD-10-CM

## 2024-06-17 ENCOUNTER — Encounter: Payer: Self-pay | Admitting: Behavioral Health

## 2024-06-27 ENCOUNTER — Other Ambulatory Visit: Payer: Self-pay | Admitting: Internal Medicine

## 2024-06-28 ENCOUNTER — Other Ambulatory Visit (HOSPITAL_COMMUNITY): Payer: Self-pay

## 2024-06-28 ENCOUNTER — Telehealth: Payer: Self-pay

## 2024-06-28 NOTE — Telephone Encounter (Signed)
 Pharmacy Patient Advocate Encounter   Received notification from RX Request Messages that prior authorization for Rybelsus  14mg  is required/requested.   Insurance verification completed.   The patient is insured through [facility].   Per test claim: PA required; PA submitted to above mentioned insurance via Latent Key/confirmation #/EOC [id number] Status is pending

## 2024-07-01 ENCOUNTER — Ambulatory Visit: Admitting: Internal Medicine

## 2024-07-01 ENCOUNTER — Encounter: Payer: Self-pay | Admitting: Internal Medicine

## 2024-07-01 ENCOUNTER — Other Ambulatory Visit

## 2024-07-01 VITALS — BP 128/84 | Ht 64.0 in | Wt 224.0 lb

## 2024-07-01 DIAGNOSIS — E1142 Type 2 diabetes mellitus with diabetic polyneuropathy: Secondary | ICD-10-CM | POA: Diagnosis not present

## 2024-07-01 DIAGNOSIS — Z7984 Long term (current) use of oral hypoglycemic drugs: Secondary | ICD-10-CM

## 2024-07-01 LAB — POCT GLYCOSYLATED HEMOGLOBIN (HGB A1C): Hemoglobin A1C: 7.1 % — AB (ref 4.0–5.6)

## 2024-07-01 MED ORDER — RYBELSUS 14 MG PO TABS: 14.0000 mg | ORAL_TABLET | Freq: Every day | ORAL | 3 refills | Status: AC

## 2024-07-01 MED ORDER — GLIMEPIRIDE 1 MG PO TABS: 1.0000 mg | ORAL_TABLET | Freq: Every morning | ORAL | 3 refills | Status: AC

## 2024-07-01 MED ORDER — METFORMIN HCL ER 500 MG PO TB24: 1000.0000 mg | ORAL_TABLET | Freq: Every day | ORAL | 3 refills | Status: AC

## 2024-07-01 NOTE — Progress Notes (Signed)
 "      Name: Morgan [name]  Age/ Sex: 59 y.o., female   MRN/ DOB: [id number], 1966/03/06     PCP: Johnny Garnette LABOR, MD   Reason for Endocrinology Evaluation: Type 2 Diabetes Mellitus  Initial Endocrine Consultative Visit: 02/16/2019    Morgan [name] IDENTIFIER: Morgan [name] is a 59 y.o. female with a past medical history of HTN, OSA,T2DM,and obesity, Ovarian cancer(S/P surgery )  and melanoma. The Morgan [name] has followed with Endocrinology clinic since 02/16/2019 for consultative assistance with management of her diabetes.  DIABETIC HISTORY:  Morgan [name] was diagnosed with T2DM in 2000. Morgan [name] has been on Metformin , Glipizide  and Actos  since her diagnosis. Her hemoglobin A1c has ranged from 6.8% in 2018, peaking at 8.3% in 2020.  Rybelsus  added in 01/2019 Pioglitazone  stopped 05/2019  We attempted to discontinue glipizide  in November 2022 due to hypoglycemia and an A1c of 6.1%, but this had to be restarted in May 2023 with an A1c 8.6%  Glimepiride  was discontinued 03/2022 due to hypoglycemia, was restarted 07/2022 with an A1c of 8.1%  Normal 24-hr urinary cortisol 08/2020  SUBJECTIVE:   During the last visit (12/18/2023): A1c 7.5 %.    Today (07/01/2024): Morgan [name] is here for a  follow up on diabetes management.   Morgan [name] checks her blood sugars occasionally. 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 Vicenta Dutch    Anxiety    on meds   Arthritis    on meds   Asthma    uses inhaler if needed   Carpal tunnel syndrome    Depression    on meds   Diabetes mellitus    on meds   GERD (gastroesophageal reflux disease)    on meds   History of ovarian cancer in adulthood    Hyperlipidemia    on meds   Hypertension    on meds   Ovarian cancer (HCC) 1986   1996   Skin cancer (melanoma) (HCC) 1997   Sleep apnea    uses CPAP    Tubular adenoma of colon 01/2014   Past Surgical History:  Past Surgical History:  Procedure Laterality Date   ABDOMINAL HYSTERECTOMY  06/30/1984   BSO   BASAL CELL CARCINOMA EXCISION  10/09/2012   lower lip per Dr. Anniece    COLONOSCOPY  03/23/2019   per Dr. Leigh, adenomatous polyps, repeat in 3 yrs   GLBS tumor ovary 1986     HEMORRHOID SURGERY     HIP FRACTURE SURGERY Right 2007   MELANOMA EXCISION  07/01/1995   upper chest    Social History:  reports that Morgan [name] has been smoking cigarettes. Morgan [name] has never  used smokeless tobacco. Morgan [name] reports that Morgan [name] does not drink alcohol and does not use drugs. Family History:  Family History  Problem Relation Age of Onset   Dementia Mother    Asthma Mother    Multiple sclerosis Father    Uterine cancer Sister 34   Diabetes Brother    Diabetes Brother    Basal cell carcinoma Brother        lower lip   Diabetes Maternal Grandfather    Colon cancer Paternal Grandmother    Colon polyps Paternal Grandmother    Stomach cancer Paternal Grandmother        dx 1s-80s   Colon polyps Paternal Grandfather    Arthritis Other    Prostate cancer Other        grandfather per Morgan [name] but Morgan [name] is unaware of which grandfather   Coronary artery disease Other    Pancreatic cancer Neg Hx    Esophageal cancer Neg Hx     Rectal cancer Neg Hx      HOME MEDICATIONS: Allergies as of 07/01/2024       Reactions   Lisinopril  Cough        Medication List        Accurate as of July 01, 2024  2:30 PM. If you have any questions, ask your nurse or doctor.          albuterol  108 (90 Base) MCG/ACT inhaler Commonly known as: VENTOLIN  HFA Inhale 2 puffs into the lungs every 6 (six) hours as needed for wheezing or shortness of breath.   ALPRAZolam  1 MG tablet Commonly known as: XANAX  Take 1 tablet (1 mg total) by mouth at bedtime as needed for sleep.   atorvastatin  80 MG tablet Commonly known as: LIPITOR  TAKE 1 TABLET BY MOUTH EVERY DAY   glimepiride  1 MG tablet Commonly known as: AMARYL  Take 1 mg by mouth every morning. What changed: Another medication with the same name was removed. Continue taking this medication, and follow the directions you see here. Changed by: Donell Butts, MD   hydrOXYzine  25 MG capsule Commonly known as: VISTARIL  Take 1 capsule (25 mg total) by mouth every 6 (six) hours as needed for anxiety.   losartan -hydrochlorothiazide  50-12.5 MG tablet Commonly known as: HYZAAR TAKE 1 TABLET BY MOUTH EVERY DAY   metFORMIN  500 MG 24 hr tablet Commonly known as: GLUCOPHAGE -XR Take 2 tablets (1,000 mg total) by mouth daily with breakfast.   metoprolol  tartrate 50 MG tablet Commonly known as: LOPRESSOR  Take 1 tablet (50 mg total) by mouth 2 (two) times daily.   omeprazole  40 MG capsule Commonly known as: PRILOSEC Take 1 capsule (40 mg total) by mouth 2 (two) times daily.   Rybelsus  14 MG Tabs Generic drug: Semaglutide  TAKE 1 TABLET (14 MG TOTAL) BY MOUTH DAILY   venlafaxine  XR 150 MG 24 hr capsule Commonly known as: EFFEXOR -XR TAKE 1 CAPSULE BY MOUTH EVERY DAY   venlafaxine  XR 75 MG 24 hr capsule Commonly known as: EFFEXOR -XR TAKE 1 CAPSULE BY MOUTH DAILY WITH BREAKFAST.         OBJECTIVE:   Vital Signs: BP 128/84   Ht 5' 4 (1.626 m)   Wt 224 lb  (101.6 kg)   LMP 03/22/1986   BMI 38.45 kg/m   Wt Readings from Last 3 Encounters:  07/01/24 224 lb (101.6 kg)  06/01/24 225 lb (102.1 kg)  04/06/24 220 lb (99.8 kg)     Exam: General: Morgan [name] appears well and is in NAD  Lungs:  Clear with good BS bilat   Heart: RRR   Neuro: MS is good with appropriate affect, Morgan [name] is alert and Ox3    DM Foot exam 12/18/2023  The skin of the feet is intact without sores or ulcerations. The pedal pulses are 1+ on right and 1+ on left. The sensation is absent  to a screening 5.07, 10 gram monofilament bilaterally    DATA REVIEWED:  Lab Results  Component Value Date   HGBA1C 7.1 (A) 07/01/2024   HGBA1C 7.5 (A) 12/18/2023   HGBA1C 7.7 (H) 07/13/2023    Latest Reference Range & Units 07/14/23 01:51  Sodium 135 - 145 mmol/L 136  Potassium 3.5 - 5.1 mmol/L 3.6  Chloride 98 - 111 mmol/L 102  CO2 22 - 32 mmol/L 23  Glucose 70 - 99 mg/dL 815 (H)  BUN 6 - 20 mg/dL 17  Creatinine 9.55 - 8.99 mg/dL 9.27  Calcium  8.9 - 10.3 mg/dL 9.3  Anion gap 5 - 15  11  Alkaline Phosphatase 38 - 126 U/L 69  Albumin 3.5 - 5.0 g/dL 4.0  AST 15 - 41 U/L 23  ALT 0 - 44 U/L 32  Total Protein 6.5 - 8.1 g/dL 7.8  Total Bilirubin 0.0 - 1.2 mg/dL 0.7  GFR, Estimated >39 mL/min >60     ASSESSMENT / PLAN / RECOMMENDATIONS:   1) Type 2 Diabetes Mellitus, Sub-Optimally  Controlled, With Neuropathic  complications - Most recent A1c of 7.1 %. Goal A1c < 7.0 %.    - A1c has been trending down -Unfortunately, Morgan [name] continues to drink regular [facility].  We discussed alternatives for low-carb options.  I did encourage the Morgan [name] to avoid sugar sweetened beverages - No changes at this time - Morgan [name] could not tolerate a higher dose of glimepiride  - MA/CR ratio pending today  MEDICATIONS:  -Continue glimepiride  1 mg daily  -Continue metformin  500 mg XR, 2 tablets daily  -Continue Rybelsus  14 mg daily    EDUCATION / INSTRUCTIONS: BG monitoring instructions: Morgan [name] is  instructed to check her blood sugars 3 times a week, fasting. Call [place] [facility] if: BG persistently < 70  I reviewed the Rule of 15 for the treatment of hypoglycemia in detail with the Morgan [name]. Literature supplied.    2) Diabetic complications:  Eye: Does not  have known diabetic retinopathy.  Neuro/ Feet: Does have known diabetic peripheral neuropathy. Renal: Morgan [name] does not have known baseline CKD.         F/U in 6 months    Signed electronically by: Stefano Redgie Butts, MD  [facility]  [facility] [address] [place] [place], [place] [address] Phone: [phone] FAX: [phone]   CC: Johnny Garnette LABOR, MD [address] [place] [address] Phone: [phone]  Fax: [phone]  Return to Endocrinology clinic as below: Future Appointments  Date Time Provider Department Center  07/01/2024  2:40 PM Pina Sirianni, Donell Redgie, MD [facility] None  07/04/2024  2:50 PM Erik Kieth BROCKS, MD [facility]  07/12/2024  1:00 PM Zulema Lorrene HERO, [facility] None  07/25/2024  4:00 PM Zulema Lorrene HERO, [facility] None     "

## 2024-07-01 NOTE — Patient Instructions (Signed)
-   Continue glimepiride  1 mg , 1 tablet daily   - Continue Metformin  500 mg, 2 tablet daily - Continue Rybelsus  14 mg , 1 tablet every morning    HOW TO TREAT LOW BLOOD SUGARS (Blood sugar LESS THAN 70 MG/DL) Please follow the RULE OF 15 for the treatment of hypoglycemia treatment (when your (blood sugars are less than 70 mg/dL)   STEP 1: Take 15 grams of carbohydrates when your blood sugar is low, which includes:  3-4 GLUCOSE TABS  OR 3-4 OZ OF JUICE OR REGULAR SODA OR ONE TUBE OF GLUCOSE GEL    STEP 2: RECHECK blood sugar in 15 MINUTES STEP 3: If your blood sugar is still low at the 15 minute recheck --> then, go back to STEP 1 and treat AGAIN with another 15 grams of carbohydrates.

## 2024-07-02 LAB — MICROALBUMIN / CREATININE URINE RATIO
Creatinine, Urine: 163 mg/dL (ref 20–275)
Microalb Creat Ratio: 4 mg/g{creat}
Microalb, Ur: 0.7 mg/dL

## 2024-07-04 ENCOUNTER — Ambulatory Visit: Payer: Self-pay | Admitting: Internal Medicine

## 2024-07-04 ENCOUNTER — Telehealth: Payer: Self-pay

## 2024-07-04 ENCOUNTER — Ambulatory Visit: Admitting: Obstetrics and Gynecology

## 2024-07-04 ENCOUNTER — Encounter: Payer: Self-pay | Admitting: Obstetrics and Gynecology

## 2024-07-04 VITALS — BP 111/76 | HR 76 | Ht 64.0 in | Wt 222.0 lb

## 2024-07-04 DIAGNOSIS — Z8639 Personal history of other endocrine, nutritional and metabolic disease: Secondary | ICD-10-CM

## 2024-07-04 DIAGNOSIS — Z1159 Encounter for screening for other viral diseases: Secondary | ICD-10-CM

## 2024-07-04 DIAGNOSIS — Z23 Encounter for immunization: Secondary | ICD-10-CM

## 2024-07-04 DIAGNOSIS — Z01419 Encounter for gynecological examination (general) (routine) without abnormal findings: Secondary | ICD-10-CM | POA: Diagnosis not present

## 2024-07-04 NOTE — Telephone Encounter (Signed)
 RN attempted to call patient to let patient know lab requisition in office and need for labs. RN left HIPAA compliant voicemail to return office call.  Silvano LELON Piano, RN

## 2024-07-04 NOTE — Progress Notes (Signed)
 "  ANNUAL EXAM Patient name: Morgan [name] MRN [id number]  Date of birth: 1966-02-09 Chief Complaint:   Gynecologic Exam (Feels like tiny pimples inside of her vagina,)  History of Present Illness:   Morgan [name] is a 59 y.o. G0P0000 s/p hysterectomy being seen today for a routine annual exam.   Current complaints:  Doing well overall. Has episodes of itching that is always alleviated with diflucan . Felt inside her vagina and thinks she felt a lump that she'd like to be checked.  Last pap n/a - s/p hysterectomy for ovarian mass Last mammogram: 11/27/22 BI-RADS 1 Last colonoscopy: 03/07/22 - next due 2028     07/04/2024    2:58 PM 03/14/2024    2:37 PM 06/15/2023    2:13 PM 05/26/2023   11:46 AM 02/24/2023    1:43 PM  Depression screen PHQ 2/9  Decreased Interest 1  3 3    Down, Depressed, Hopeless 0  3 3   PHQ - 2 Score 1  6 6    Altered sleeping 0  2 3   Tired, decreased energy 2  3 3    Change in appetite 0  3 3   Feeling bad or failure about yourself  1  3 3    Trouble concentrating 0  0 1   Moving slowly or fidgety/restless 0  0 0   Suicidal thoughts 1  0 2   PHQ-9 Score 5  17  21     Difficult doing work/chores   Somewhat difficult Somewhat difficult      Information is confidential and restricted. Go to Review Flowsheets to unlock data.   Data saved with a previous flowsheet row definition        07/04/2024    2:58 PM 06/15/2023    2:12 PM 05/26/2023   11:49 AM 10/06/2022    4:10 PM  GAD 7 : Generalized Anxiety Score  Nervous, Anxious, on Edge 3 1 3 2   Control/stop worrying 3 3 3 3   Worry too much - different things 3 3 3 3   Trouble relaxing 1 0 3 1  Restless 1 1 1 2   Easily annoyed or irritable 3 3 3 3   Afraid - awful might happen 0 2 3   Total GAD 7 Score 14 13 19    Anxiety Difficulty  Not difficult at all Somewhat difficult Not difficult at all     Review of Systems:   Pertinent items are noted in HPI Denies any headaches, blurred vision,  fatigue, shortness of breath, chest pain, abdominal pain, abnormal vaginal discharge/itching/odor/irritation, problems with periods, bowel movements, urination, or intercourse unless otherwise stated above. Pertinent History Reviewed:  Reviewed past medical,surgical, social and family history.  Reviewed problem list, medications and allergies. Physical Assessment:   Vitals:   07/04/24 1450  BP: 111/76  Pulse: 76  Weight: 222 lb (100.7 kg)  Height: 5' 4 (1.626 m)  Body mass index is 38.11 kg/m.        Physical Examination:   General appearance - well appearing, and in no distress  Mental status - alert, oriented to person, place, and time  Chest - respiratory effort normal  Heart - normal peripheral perfusion  Breasts - breasts appear normal, no suspicious masses, no skin or nipple changes or axillary nodes  Abdomen - soft, nontender, nondistended, no masses or organomegaly  Pelvic - VULVA: normal appearing vulva with no masses, tenderness or lesions  VAGINA: normal appearing vagina with normal color and discharge, no lesions. No visible  or palpable mass.  CERVIX: surgically absent  UTERUS: surgically absent  ADNEXA: No adnexal masses noted  Chaperone present for exam  No results found. However, due to the size of the patient record, not all encounters were searched. Please check Results Review for a complete set of results.  Assessment & Plan:  1) Well-Woman Exam Mammogram: schedule screening mammo as soon as possible Colonoscopy: due 2028 Pap: n/a - no longer indicated Gardasil: n/a GC/CT: n/a HIV/HCV: HCV screening recommended & ordered  2) Hx T2DM Due for GFR measurement, CMP ordered  Labs/procedures today:  Orders Placed This Encounter  Procedures   MM 3D SCREENING MAMMOGRAM BILATERAL BREAST   Tdap vaccine greater than or equal to 7yo IM   HCV Ab w Reflex to Quant PCR   Comp Met (CMET)   Follow-up: Return in about 1 year (around 07/04/2025) for annual exam or  sooner as needed.  Kieth JAYSON Carolin, MD 07/05/2024 9:49 AM  "

## 2024-07-12 ENCOUNTER — Encounter: Payer: Self-pay | Admitting: Behavioral Health

## 2024-07-12 ENCOUNTER — Ambulatory Visit: Admitting: Behavioral Health

## 2024-07-12 DIAGNOSIS — F411 Generalized anxiety disorder: Secondary | ICD-10-CM | POA: Diagnosis not present

## 2024-07-12 DIAGNOSIS — R454 Irritability and anger: Secondary | ICD-10-CM

## 2024-07-12 DIAGNOSIS — F331 Major depressive disorder, recurrent, moderate: Secondary | ICD-10-CM | POA: Diagnosis not present

## 2024-07-12 NOTE — Progress Notes (Signed)
 "  [facility] Counselor/Therapist Progress Note  Patient ID: Morgan [name], MRN: [id number],    Date: July 12, 2024  Time Spent: 1 PM until 1:58 PM, 58 minutes spent face-to-face with the patient in the outpatient therapist office.  Treatment Type: Individual Therapy  Reported Symptoms: Anxiety, depression, irritability  Mental Status Exam: Appearance:  Casual     Behavior: Appropriate  Motor: Normal  Speech/Language:  Normal Rate  Affect: Appropriate  Mood: normal  Thought process: normal  Thought content:   WNL  Sensory/Perceptual disturbances:   WNL  Orientation: oriented to person, place, time/date, situation, day of week, month of year, and year  Attention: Good  Concentration: Good  Memory: WNL  Fund of knowledge:  Good  Insight:   Good  Judgment:  Good  Impulse Control: Good   Risk Assessment: Danger to Self:  The patient has had thoughts of being overwhelmed but says she will contract for safety not hurt herself or anyone else. 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  They brought her back at her current salary which she was excited about.  She is guaranteed 40 hours plus a few hours on a Saturday.  She did say the patient's daughter has moved in with her and the patient is asking her daughter to do more things so the patient said there are times that she does not do anything.  Urged her to use this as a time to rest but to offer to do what she can and even talk to her patient's daughter about letting the patient do more when the patient's daughter is working on school things.  She started thinking more about what she can do for her future because she knows she needs to do  something good.  She checked on bus rates to go visit her mother and sister and said it is affordable so I encouraged her to take a couple of paychecks to put aside bus money and little extra money and schedule a bus trip to see her family.  She said especially since her patient's daughter is there she feels better able to do that.  She is also talking about trying to find a better used car and we talked about what she would need to set aside to make that a possibility knowing that it could take a while to save up enough to do that.  I encouraged continued use of mindfulness and coping skills along with positive thoughts and affirmations.   She does contract for safety having no thoughts of hurting herself or anyone else.  Interventions: Cognitive Behavioral Therapy and Dialectical Behavioral Therapy  Diagnosis: Generalized anxiety disorder, major depressive disorder,recurrent,severe. Irritability  Plan: I will meet with the patient every 2 to 3 weeks in person.  Treatment plan: We will use cognitive behavioral therapy principles as well as elements of dialectical behavior therapy to reduce the patient's anxiety depression and irritability by at least 50% with a target goal of November 30 , 2025.  Goals for her depression will be for the patient to have less sadness as indicated by patient report and PHQ-9 scores, have improved mood and return to a healthier level of functioning as well as identify causes  for depressed mood and learn ways to reduce depression and using a crisis plan if patient has active suicidal thoughts.  Interventions include using cognitive behavioral therapy to explore and replace unhealthy thought and behavior patterns contributing to depression, provide education about depression to help her understand its causes, teach and encouraged use of coping skills to reduce depression and make a crisis plan and assess ongoing level of safety.  Goals for reducing anxiety include  improving her ability to manage anxiety/stress symptoms, identify causes for anxiety and stress and explore ways to reduce it, resolve core conflicts including family history contributing to it and help her manage thoughts and worrisome thinking contributing to feelings of anxiety.  Interventions for reducing anxiety include providing education about anxiety to help around identify its causes symptoms and triggers, facilitate problem solution skills and coping skills for reducing anxiety.  We will also use cognitive behavioral therapy to identify and change anxiety provoking thought and behavior patterns as well as use dialectical behavior therapy to reduce anxiety through distress tolerance and mindfulness skills.  Goals for reducing irritability/anger include helping the patient felt an awareness of anger behaviors and alternatives to using aggressive behavior.  We will also look at learning ways to handle angry feelings in constructive ways that cannot hands daily functioning for the patient.  Interventions including using dialectical behavior therapy mindfulness skills, cognitive behavioral therapy to identify negative consequences for expressing anger and irritability and unhealthy ways.  We will also use cognitive behavioral therapy to explore and replace unhealthy thought and behavior patterns contributing to feelings of resentment and disappointment.  We will identify core values that may not being met that are contributing to feelings of anger.  We will confront and reflects the patient's behaviors that happened when both angry and out of session.  We will assist the patient in looking at how anger was modeled for her and how this positively or negatively impacted her.  We will also teach respectively assertive communication skills as well as self soothing skills to help the patient better manage anger reactions.  Progress:35%  Lorrene CHRISTELLA Hasten, [facility]                  Lorrene CHRISTELLA Hasten,  [facility]               Lorrene CHRISTELLA Hasten, [facility]               Lorrene CHRISTELLA Hasten, [facility]                Lorrene CHRISTELLA Hasten, [facility]               Lorrene CHRISTELLA Hasten, [facility]               Lorrene CHRISTELLA Hasten, [facility]               Lorrene CHRISTELLA Hasten, [facility]               Lorrene CHRISTELLA Hasten, [facility]               Lorrene CHRISTELLA Hasten, [facility]               Lorrene CHRISTELLA Hasten, [facility]               Lorrene CHRISTELLA Hasten, [facility]               Lorrene CHRISTELLA Hasten, [facility]               Lorrene CHRISTELLA Hasten,  [facility]               Lorrene CHRISTELLA Hasten, [facility]               Lorrene CHRISTELLA Hasten, [facility]               Lorrene CHRISTELLA Hasten, [facility]               Lorrene CHRISTELLA Hasten, [facility]               Lorrene CHRISTELLA Hasten, [facility]               Lorrene CHRISTELLA Hasten, [facility]               Lorrene CHRISTELLA Hasten, [facility]               Lorrene CHRISTELLA Hasten, [facility]               Lorrene CHRISTELLA Hasten, [facility] "

## 2024-07-15 ENCOUNTER — Encounter: Payer: Self-pay | Admitting: Family Medicine

## 2024-07-15 NOTE — Telephone Encounter (Signed)
 She should see us  first to determine the next step

## 2024-07-18 ENCOUNTER — Telehealth: Payer: Self-pay | Admitting: Podiatry

## 2024-07-18 NOTE — Telephone Encounter (Signed)
 [facility] in [facility] called patient left message on voicemail to call to schedule [facility] appt.

## 2024-07-20 ENCOUNTER — Encounter: Payer: Self-pay | Admitting: Family Medicine

## 2024-07-20 ENCOUNTER — Other Ambulatory Visit: Payer: Self-pay | Admitting: Family Medicine

## 2024-07-24 ENCOUNTER — Encounter: Payer: Self-pay | Admitting: Obstetrics and Gynecology

## 2024-07-25 ENCOUNTER — Ambulatory Visit: Admitting: Behavioral Health

## 2024-07-27 ENCOUNTER — Encounter: Payer: Self-pay | Admitting: Behavioral Health

## 2024-07-28 ENCOUNTER — Other Ambulatory Visit: Payer: Self-pay | Admitting: Nurse Practitioner

## 2024-07-28 ENCOUNTER — Telehealth: Payer: Self-pay | Admitting: Nurse Practitioner

## 2024-07-28 MED ORDER — OMEPRAZOLE 40 MG PO CPDR: 40.0000 mg | DELAYED_RELEASE_CAPSULE | Freq: Two times a day (BID) | ORAL | 0 refills | Status: AC

## 2024-07-28 NOTE — Telephone Encounter (Signed)
 Thirty day refill sent to pharmacy to get her to her appointment in Feb

## 2024-07-28 NOTE — Telephone Encounter (Signed)
 Inbound call from patient requesting to get a refill on her Omeprazole . Patient was scheduled with [facility] for February the 20 th. Please advise.

## 2024-07-29 ENCOUNTER — Telehealth: Payer: Self-pay | Admitting: Podiatry

## 2024-08-08 ENCOUNTER — Ambulatory Visit: Admitting: Behavioral Health

## 2024-08-12 ENCOUNTER — Ambulatory Visit

## 2024-08-19 ENCOUNTER — Ambulatory Visit: Admitting: Gastroenterology

## 2024-08-22 ENCOUNTER — Ambulatory Visit: Admitting: Behavioral Health

## 2024-09-05 ENCOUNTER — Ambulatory Visit: Admitting: Behavioral Health

## 2025-01-06 ENCOUNTER — Ambulatory Visit: Admitting: Internal Medicine
